# Patient Record
Sex: Male | Born: 1935 | Race: White | Hispanic: No | Smoking: Former smoker
Health system: Southern US, Community
[De-identification: ages and names within clinical notes are randomized; demographics above are authoritative.]

## PROBLEM LIST (undated history)

## (undated) ENCOUNTER — Emergency Department: Payer: PRIVATE HEALTH INSURANCE

## (undated) DIAGNOSIS — E7800 Pure hypercholesterolemia, unspecified: Secondary | ICD-10-CM

## (undated) DIAGNOSIS — K2980 Duodenitis without bleeding: Secondary | ICD-10-CM

## (undated) DIAGNOSIS — K219 Gastro-esophageal reflux disease without esophagitis: Secondary | ICD-10-CM

## (undated) DIAGNOSIS — C61 Malignant neoplasm of prostate: Secondary | ICD-10-CM

## (undated) DIAGNOSIS — Z87442 Personal history of urinary calculi: Secondary | ICD-10-CM

## (undated) DIAGNOSIS — J45909 Unspecified asthma, uncomplicated: Secondary | ICD-10-CM

## (undated) DIAGNOSIS — N289 Disorder of kidney and ureter, unspecified: Secondary | ICD-10-CM

## (undated) DIAGNOSIS — F32A Depression, unspecified: Secondary | ICD-10-CM

## (undated) DIAGNOSIS — F329 Major depressive disorder, single episode, unspecified: Secondary | ICD-10-CM

## (undated) DIAGNOSIS — K5790 Diverticulosis of intestine, part unspecified, without perforation or abscess without bleeding: Secondary | ICD-10-CM

## (undated) DIAGNOSIS — R251 Tremor, unspecified: Secondary | ICD-10-CM

## (undated) DIAGNOSIS — G709 Myoneural disorder, unspecified: Secondary | ICD-10-CM

## (undated) DIAGNOSIS — K2270 Barrett's esophagus without dysplasia: Secondary | ICD-10-CM

## (undated) DIAGNOSIS — I10 Essential (primary) hypertension: Secondary | ICD-10-CM

## (undated) DIAGNOSIS — S8291XA Unspecified fracture of right lower leg, initial encounter for closed fracture: Secondary | ICD-10-CM

## (undated) DIAGNOSIS — K2970 Gastritis, unspecified, without bleeding: Secondary | ICD-10-CM

## (undated) DIAGNOSIS — Z972 Presence of dental prosthetic device (complete) (partial): Secondary | ICD-10-CM

## (undated) DIAGNOSIS — E119 Type 2 diabetes mellitus without complications: Secondary | ICD-10-CM

## (undated) DIAGNOSIS — N200 Calculus of kidney: Secondary | ICD-10-CM

## (undated) DIAGNOSIS — N2889 Other specified disorders of kidney and ureter: Secondary | ICD-10-CM

## (undated) DIAGNOSIS — N400 Enlarged prostate without lower urinary tract symptoms: Secondary | ICD-10-CM

## (undated) DIAGNOSIS — C801 Malignant (primary) neoplasm, unspecified: Secondary | ICD-10-CM

## (undated) DIAGNOSIS — G4730 Sleep apnea, unspecified: Secondary | ICD-10-CM

## (undated) DIAGNOSIS — I2510 Atherosclerotic heart disease of native coronary artery without angina pectoris: Secondary | ICD-10-CM

## (undated) DIAGNOSIS — F0390 Unspecified dementia without behavioral disturbance: Secondary | ICD-10-CM

## (undated) DIAGNOSIS — M1990 Unspecified osteoarthritis, unspecified site: Secondary | ICD-10-CM

## (undated) HISTORY — PX: CERVICAL FUSION: SHX112

## (undated) HISTORY — PX: KNEE FUSION: SUR623

## (undated) HISTORY — PX: CORONARY ANGIOPLASTY: SHX604

## (undated) HISTORY — PX: EYE SURGERY: SHX253

## (undated) HISTORY — PX: HERNIA REPAIR: SHX51

## (undated) HISTORY — PX: JOINT REPLACEMENT: SHX530

## (undated) HISTORY — PX: CARDIAC CATHETERIZATION: SHX172

## (undated) HISTORY — PX: NISSEN FUNDOPLICATION: SHX2091

## (undated) HISTORY — PX: APPENDECTOMY: SHX54

## (undated) HISTORY — PX: KNEE SURGERY: SHX244

---
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
## 2003-10-14 ENCOUNTER — Other Ambulatory Visit: Payer: Self-pay

## 2004-09-02 ENCOUNTER — Other Ambulatory Visit: Payer: Self-pay

## 2004-09-02 ENCOUNTER — Inpatient Hospital Stay: Payer: Self-pay | Admitting: Internal Medicine

## 2004-12-09 ENCOUNTER — Ambulatory Visit: Payer: Self-pay | Admitting: Internal Medicine

## 2005-03-12 ENCOUNTER — Emergency Department: Payer: Self-pay | Admitting: Emergency Medicine

## 2005-06-08 ENCOUNTER — Inpatient Hospital Stay: Payer: Self-pay | Admitting: Cardiovascular Disease

## 2005-06-08 ENCOUNTER — Other Ambulatory Visit: Payer: Self-pay

## 2005-06-09 ENCOUNTER — Other Ambulatory Visit: Payer: Self-pay

## 2005-07-25 ENCOUNTER — Ambulatory Visit: Payer: Self-pay | Admitting: Internal Medicine

## 2005-11-22 ENCOUNTER — Ambulatory Visit: Payer: Self-pay | Admitting: Urology

## 2005-12-12 ENCOUNTER — Ambulatory Visit: Payer: Self-pay | Admitting: Internal Medicine

## 2005-12-24 ENCOUNTER — Ambulatory Visit: Payer: Self-pay | Admitting: Family Medicine

## 2006-05-12 ENCOUNTER — Ambulatory Visit: Payer: Self-pay | Admitting: Cardiovascular Disease

## 2006-05-23 ENCOUNTER — Ambulatory Visit: Payer: Self-pay | Admitting: Urology

## 2007-01-17 ENCOUNTER — Inpatient Hospital Stay: Payer: Self-pay | Admitting: Cardiovascular Disease

## 2007-01-18 ENCOUNTER — Other Ambulatory Visit: Payer: Self-pay

## 2007-01-23 ENCOUNTER — Ambulatory Visit: Payer: Self-pay | Admitting: Urology

## 2007-01-30 ENCOUNTER — Ambulatory Visit: Payer: Self-pay | Admitting: Cardiovascular Disease

## 2007-08-03 ENCOUNTER — Ambulatory Visit: Payer: Self-pay | Admitting: Family Medicine

## 2007-09-25 ENCOUNTER — Ambulatory Visit: Payer: Self-pay | Admitting: Gastroenterology

## 2007-12-27 ENCOUNTER — Ambulatory Visit: Payer: Self-pay | Admitting: Gastroenterology

## 2008-06-09 ENCOUNTER — Ambulatory Visit: Payer: Self-pay | Admitting: Family Medicine

## 2008-08-06 ENCOUNTER — Ambulatory Visit: Payer: Self-pay | Admitting: Internal Medicine

## 2008-09-01 ENCOUNTER — Ambulatory Visit: Payer: Self-pay | Admitting: Family Medicine

## 2008-10-01 ENCOUNTER — Ambulatory Visit: Payer: Self-pay | Admitting: Family Medicine

## 2009-01-26 ENCOUNTER — Ambulatory Visit: Payer: Self-pay | Admitting: Gastroenterology

## 2009-02-10 ENCOUNTER — Ambulatory Visit: Payer: Self-pay | Admitting: Cardiovascular Disease

## 2009-05-19 ENCOUNTER — Emergency Department: Payer: Self-pay | Admitting: Emergency Medicine

## 2009-05-19 ENCOUNTER — Ambulatory Visit: Payer: Self-pay

## 2009-05-20 ENCOUNTER — Ambulatory Visit: Payer: Self-pay

## 2009-07-01 ENCOUNTER — Ambulatory Visit: Payer: Self-pay | Admitting: Family Medicine

## 2009-07-09 ENCOUNTER — Ambulatory Visit: Payer: Self-pay | Admitting: Surgery

## 2009-08-03 ENCOUNTER — Ambulatory Visit: Payer: Self-pay | Admitting: Pain Medicine

## 2009-08-26 ENCOUNTER — Emergency Department: Payer: Self-pay | Admitting: Emergency Medicine

## 2010-02-02 ENCOUNTER — Inpatient Hospital Stay: Payer: Self-pay | Admitting: Internal Medicine

## 2010-07-22 ENCOUNTER — Ambulatory Visit: Payer: Self-pay | Admitting: Urology

## 2011-04-12 ENCOUNTER — Ambulatory Visit: Payer: Self-pay | Admitting: Gastroenterology

## 2011-05-10 ENCOUNTER — Ambulatory Visit: Payer: Self-pay | Admitting: Gastroenterology

## 2011-07-25 ENCOUNTER — Ambulatory Visit: Payer: Self-pay | Admitting: Urology

## 2011-09-02 ENCOUNTER — Ambulatory Visit: Payer: Self-pay | Admitting: Internal Medicine

## 2012-03-27 ENCOUNTER — Ambulatory Visit: Payer: Self-pay | Admitting: Gastroenterology

## 2012-05-22 ENCOUNTER — Ambulatory Visit: Payer: Self-pay | Admitting: Internal Medicine

## 2012-06-16 ENCOUNTER — Emergency Department: Payer: Self-pay | Admitting: Emergency Medicine

## 2012-06-16 LAB — CBC
HCT: 39.4 % — ABNORMAL LOW (ref 40.0–52.0)
HGB: 13.5 g/dL (ref 13.0–18.0)
MCH: 30.3 pg (ref 26.0–34.0)
MCHC: 34.2 g/dL (ref 32.0–36.0)
Platelet: 136 10*3/uL — ABNORMAL LOW (ref 150–440)

## 2012-06-16 LAB — URINALYSIS, COMPLETE
Bacteria: NONE SEEN
Bilirubin,UR: NEGATIVE
Hyaline Cast: 7
Ph: 6 (ref 4.5–8.0)
Protein: 100
RBC,UR: 1 /HPF (ref 0–5)
Specific Gravity: 1.012 (ref 1.003–1.030)

## 2012-06-16 LAB — CK TOTAL AND CKMB (NOT AT ARMC): CK, Total: 116 U/L (ref 35–232)

## 2012-06-16 LAB — COMPREHENSIVE METABOLIC PANEL
Alkaline Phosphatase: 92 U/L (ref 50–136)
Anion Gap: 10 (ref 7–16)
Bilirubin,Total: 0.4 mg/dL (ref 0.2–1.0)
Calcium, Total: 8.9 mg/dL (ref 8.5–10.1)
Creatinine: 2.65 mg/dL — ABNORMAL HIGH (ref 0.60–1.30)
EGFR (African American): 26 — ABNORMAL LOW
EGFR (Non-African Amer.): 23 — ABNORMAL LOW
Glucose: 249 mg/dL — ABNORMAL HIGH (ref 65–99)
Osmolality: 285 (ref 275–301)
Potassium: 4.2 mmol/L (ref 3.5–5.1)
SGOT(AST): 24 U/L (ref 15–37)
SGPT (ALT): 23 U/L (ref 12–78)

## 2012-06-16 LAB — TROPONIN I: Troponin-I: <0.02 ng/mL

## 2012-06-16 LAB — PROTIME-INR
INR: 1
Prothrombin Time: 13 secs (ref 11.5–14.7)

## 2012-06-16 LAB — MAGNESIUM: Magnesium: 1.6 mg/dL — ABNORMAL LOW

## 2012-06-16 LAB — PRO B NATRIURETIC PEPTIDE: B-Type Natriuretic Peptide: 135 pg/mL — ABNORMAL HIGH (ref 0–125)

## 2012-06-16 LAB — APTT: Activated PTT: 27.7 secs (ref 23.6–35.9)

## 2012-06-19 ENCOUNTER — Ambulatory Visit: Payer: Self-pay | Admitting: Internal Medicine

## 2012-08-14 DIAGNOSIS — N200 Calculus of kidney: Secondary | ICD-10-CM | POA: Insufficient documentation

## 2012-08-14 DIAGNOSIS — R339 Retention of urine, unspecified: Secondary | ICD-10-CM | POA: Insufficient documentation

## 2012-08-14 DIAGNOSIS — N281 Cyst of kidney, acquired: Secondary | ICD-10-CM | POA: Insufficient documentation

## 2012-08-14 DIAGNOSIS — N401 Enlarged prostate with lower urinary tract symptoms: Secondary | ICD-10-CM | POA: Insufficient documentation

## 2012-08-14 DIAGNOSIS — N2889 Other specified disorders of kidney and ureter: Secondary | ICD-10-CM | POA: Insufficient documentation

## 2012-08-14 DIAGNOSIS — R972 Elevated prostate specific antigen [PSA]: Secondary | ICD-10-CM | POA: Insufficient documentation

## 2012-08-14 DIAGNOSIS — D4100 Neoplasm of uncertain behavior of unspecified kidney: Secondary | ICD-10-CM | POA: Insufficient documentation

## 2012-08-15 DIAGNOSIS — N183 Chronic kidney disease, stage 3 unspecified: Secondary | ICD-10-CM | POA: Insufficient documentation

## 2013-08-27 ENCOUNTER — Ambulatory Visit: Payer: Self-pay | Admitting: Gastroenterology

## 2013-08-28 LAB — PATHOLOGY REPORT

## 2014-02-02 DIAGNOSIS — Z794 Long term (current) use of insulin: Secondary | ICD-10-CM | POA: Insufficient documentation

## 2014-02-02 DIAGNOSIS — R809 Proteinuria, unspecified: Secondary | ICD-10-CM | POA: Insufficient documentation

## 2014-02-02 DIAGNOSIS — G629 Polyneuropathy, unspecified: Secondary | ICD-10-CM | POA: Insufficient documentation

## 2014-02-02 DIAGNOSIS — E1149 Type 2 diabetes mellitus with other diabetic neurological complication: Secondary | ICD-10-CM | POA: Insufficient documentation

## 2014-04-02 IMAGING — US US RENAL KIDNEY
1 series · 17 of 25 positions shown · non-contrast
Comparison: none

REASON FOR EXAM: complex cysts with uric acid stones
COMMENTS:

PROCEDURE:     US  - US KIDNEY  - July 25, 2011  [DATE]
RESULT:

[Series 1: us renal kidney · 17 of 45 slices shown]
[im 1/45]
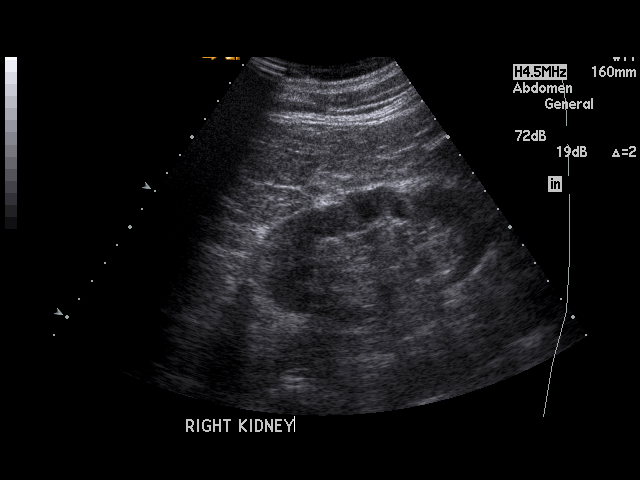
[im 4/45]
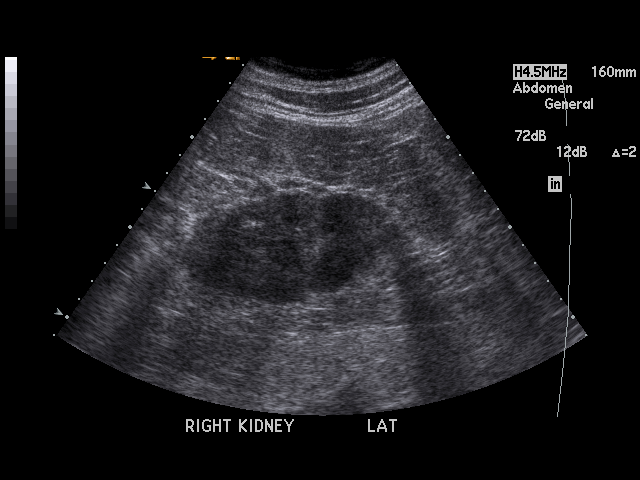
[im 6/45]
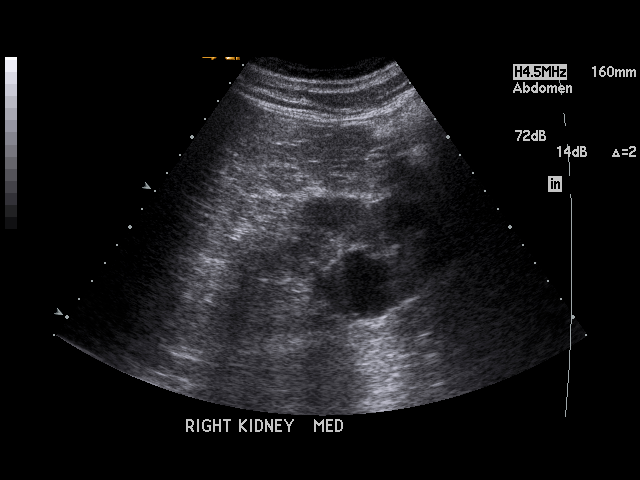
[im 10/45]
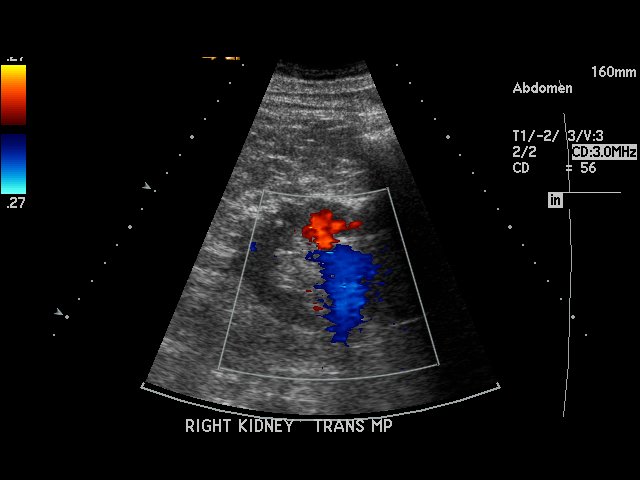
[im 12/45]
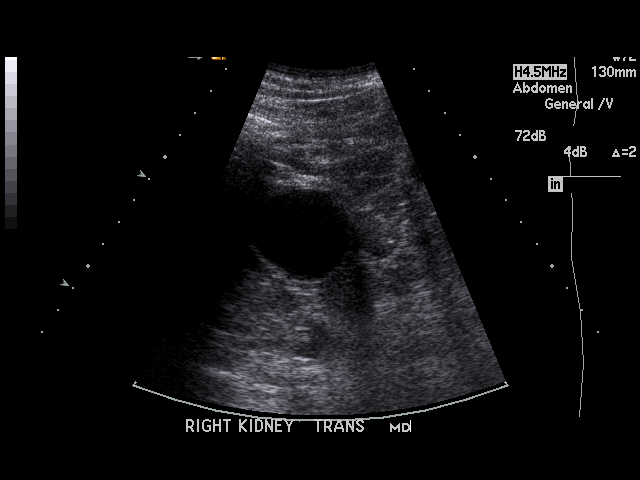
[im 15/45]
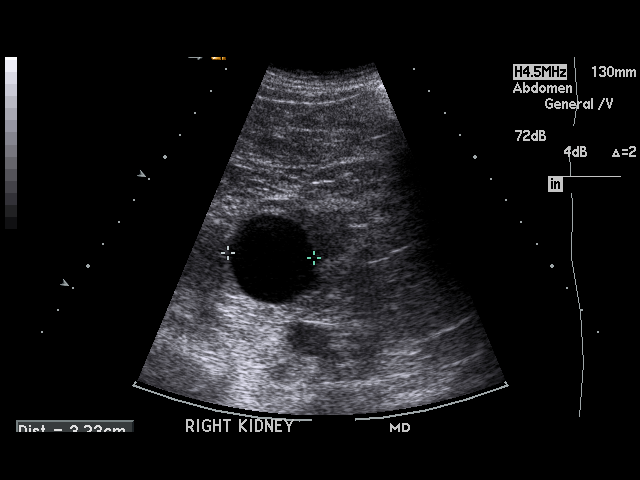
[im 17/45]
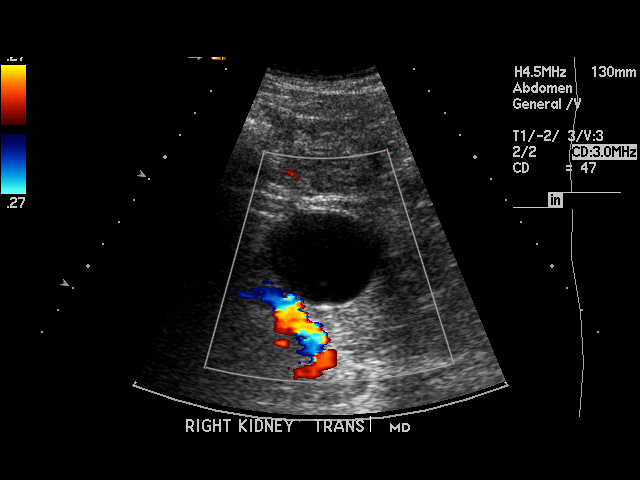
[im 21/45]
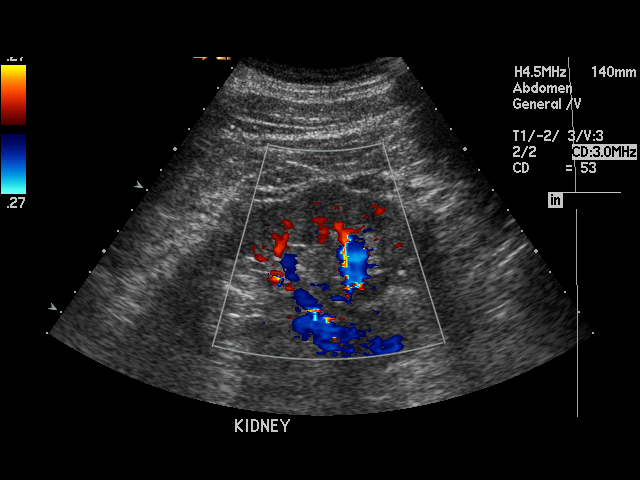
[im 23/45]
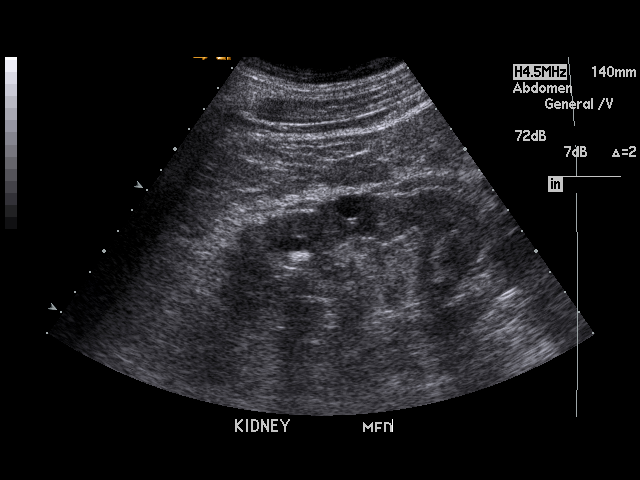
[im 24/45]
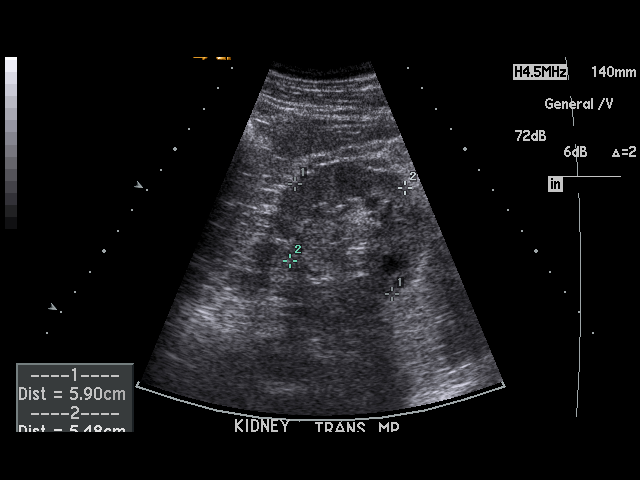
[im 28/45]
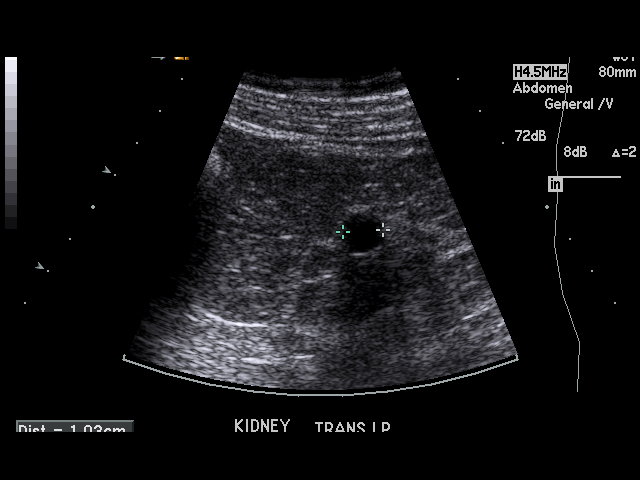
[im 30/45]
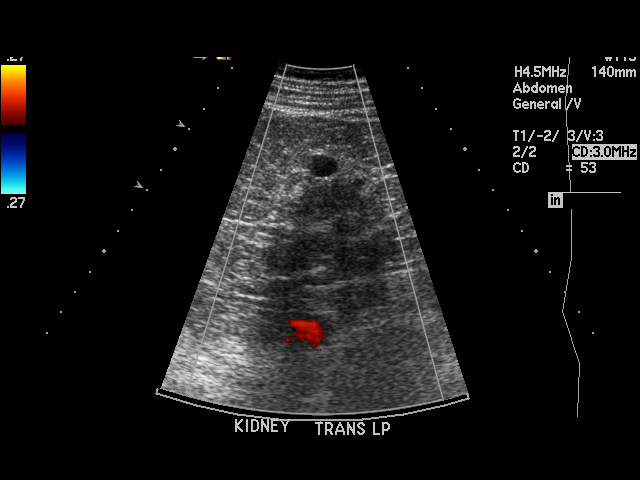
[im 34/45]
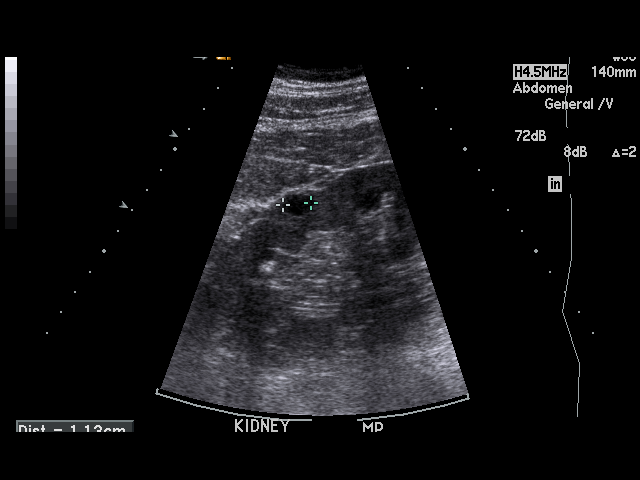
[im 35/45]
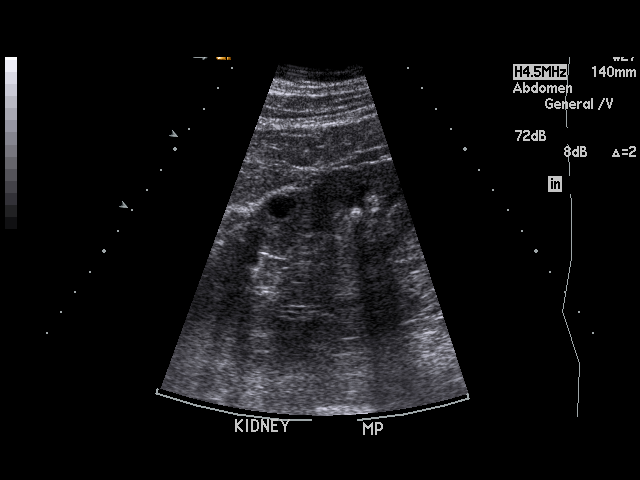
[im 39/45]
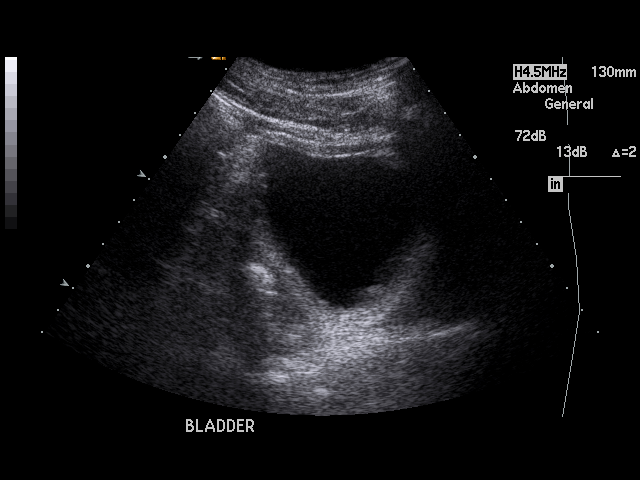
[im 41/45]
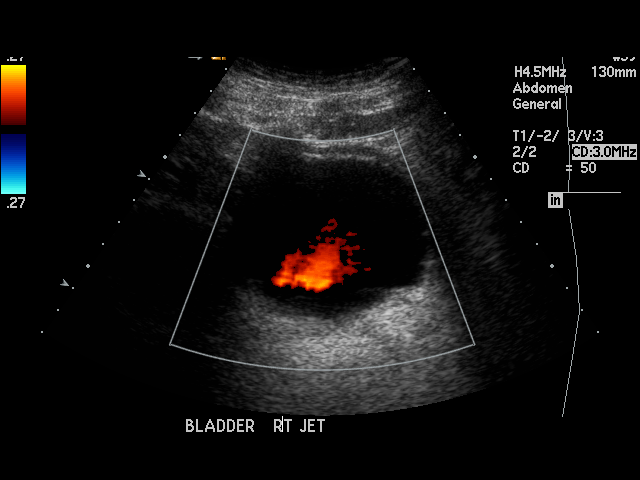
[im 45/45]
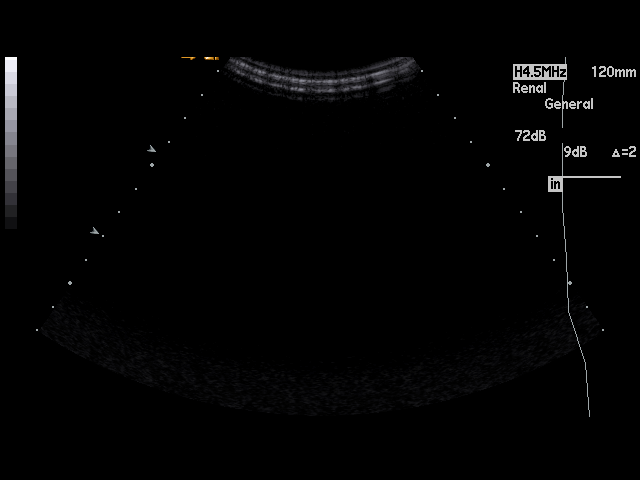

[17 of 25 positions shown; findings below may reference images not displayed]

FINDINGS: The right kidney measures 11.2 x 5.5 x 5.1 cm and the left 11.7 x
5.9 x 5.4 cm. The kidneys demonstrate appropriate corticomedullary
differentiation without evidence of hydronephrosis, solid masses or calculi.
A cyst is identified within the midpole region of the right kidney measuring
3.6 x 3.4 by 3.2 cm. On the left a lower pole cyst is identified measuring
1.2 x 1 x 1 cm and a midpole cyst measuring 1 x 0.93 x 1.1 cm. This study
was compared to a previous study dated 07/22/2010. The cyst within the midpole
region of the right kidney, with technical variability taken into
consideration, is unchanged and previously measuring 3.4 cm in diameter. The
two cysts described within the left kidney are unchanged.
IMPRESSION: Stable cyst within the right and left kidneys as described
above. There is slight discrepancy between the measurements of the cyst in
the right kidney though with technical variability taken into consideration
the cyst is unchanged.

## 2014-07-11 NOTE — Consult Note (Signed)
  DATE OF BIRTH:  01-30-1936  DATE OF CONSULTATION:  06/16/2012  CONSULTING PHYSICIAN:  Dionisio David, MD  HISTORY OF PRESENT ILLNESS: This is a 79 year old white male with a past medical history of PCI and stenting, coronary artery disease, renal insufficiency, came into the hospital with neck pain and right facial pain. He was also dizzy and had low blood pressure, about 80 systolic. Thus, I was asked to evaluate the patient. The patient denies any chest pain right now, but has right facial pain associated with shortness of breath.   PAST MEDICAL HISTORY: History of hypertension, hyperlipidemia, status post PCI and stenting.   SOCIAL HISTORY:  Unremarkable.   PHYSICAL EXAMINATION: VITAL SIGNS:  His blood pressure is 89/60, respirations 14, his heart rate is 78.  NECK:  No JVD.  LUNGS:  Clear.  HEART:  Regular rate and rhythm. Normal S1, S2. No audible murmur.  ABDOMEN:  Soft, nontender, positive bowel sounds.  EXTREMITIES:  No pedal edema.  NEUROLOGIC:  The patient appears to be intact.   LAB DATA:  His potassium is 4.2, creatinine is 2.65, BUN is 41. His CPK is 116, MB is 3.4. Troponin is 0.02. His magnesium is 1.6. BNP is 135. Chest x-ray is unremarkable.   ASSESSMENT AND PLAN: The patient has renal insufficiency, atypical chest pain. EKG shows normal sinus rhythm, no acute changes. His cardiac enzyme panel is negative. He does have renal insufficiency, but has been seeing Renal in Dr. Elwyn Lade and Dr. Keturah Barre office. His GFR is right now 23, but it was 35, so apparently he is a little bit dehydrated. Advise giving some hydration. May consider giving the patient some antibiotic for possible sinusitis, and followup in the office at 11:00 tomorrow. Thank you very much for the referral. His stress test in the office was done Wednesday, it was unremarkable.      ____________________________ Dionisio David, MD sak:mr D: 06/16/2012 18:26:44 ET T: 06/16/2012 19:12:43  ET JOB#: [id number]  cc: Dionisio David, MD, <Dictator> Dionisio David MD ELECTRONICALLY SIGNED 06/21/2012 9:33

## 2014-07-26 ENCOUNTER — Emergency Department: Payer: Medicare Other

## 2014-07-26 ENCOUNTER — Other Ambulatory Visit: Payer: Self-pay

## 2014-07-26 ENCOUNTER — Emergency Department
Admission: EM | Admit: 2014-07-26 | Discharge: 2014-07-26 | Disposition: A | Discharge status: Home / Self Care | Payer: Medicare Other | Attending: Emergency Medicine | Admitting: Emergency Medicine

## 2014-07-26 DIAGNOSIS — R0602 Shortness of breath: Secondary | ICD-10-CM | POA: Diagnosis not present

## 2014-07-26 DIAGNOSIS — M6281 Muscle weakness (generalized): Secondary | ICD-10-CM | POA: Diagnosis not present

## 2014-07-26 DIAGNOSIS — F329 Major depressive disorder, single episode, unspecified: Secondary | ICD-10-CM | POA: Diagnosis not present

## 2014-07-26 DIAGNOSIS — R251 Tremor, unspecified: Secondary | ICD-10-CM | POA: Diagnosis not present

## 2014-07-26 DIAGNOSIS — R42 Dizziness and giddiness: Secondary | ICD-10-CM | POA: Diagnosis present

## 2014-07-26 DIAGNOSIS — F32A Depression, unspecified: Secondary | ICD-10-CM

## 2014-07-26 LAB — URINALYSIS COMPLETE WITH MICROSCOPIC (ARMC ONLY)
Bacteria, UA: NONE SEEN
Bilirubin Urine: NEGATIVE
Glucose, UA: NEGATIVE mg/dL
Hgb urine dipstick: NEGATIVE
KETONES UR: NEGATIVE mg/dL
Leukocytes, UA: NEGATIVE
NITRITE: NEGATIVE
PH: 5 (ref 5.0–8.0)
PROTEIN: NEGATIVE mg/dL
Specific Gravity, Urine: 1.011 (ref 1.005–1.030)

## 2014-07-26 LAB — BASIC METABOLIC PANEL
Anion gap: 7 (ref 5–15)
BUN: 31 mg/dL — AB (ref 6–20)
CHLORIDE: 107 mmol/L (ref 101–111)
CO2: 21 mmol/L — ABNORMAL LOW (ref 22–32)
Calcium: 8.8 mg/dL — ABNORMAL LOW (ref 8.9–10.3)
Creatinine, Ser: 0.99 mg/dL (ref 0.61–1.24)
GFR calc Af Amer: >60 mL/min (ref >60)
Glucose, Bld: 127 mg/dL — ABNORMAL HIGH (ref 65–99)
Potassium: 4.1 mmol/L (ref 3.5–5.1)
Sodium: 135 mmol/L (ref 135–145)

## 2014-07-26 LAB — CBC WITH DIFFERENTIAL/PLATELET
Basophils Absolute: 0 10*3/uL (ref 0–0.1)
Basophils Relative: 1 %
Eosinophils Absolute: 0.1 10*3/uL (ref 0–0.7)
Eosinophils Relative: 1 %
HCT: 40.7 % (ref 40.0–52.0)
Hemoglobin: 13.7 g/dL (ref 13.0–18.0)
LYMPHS ABS: 1.7 10*3/uL (ref 1.0–3.6)
LYMPHS PCT: 24 %
MCH: 29.5 pg (ref 26.0–34.0)
MCHC: 33.7 g/dL (ref 32.0–36.0)
MCV: 87.6 fL (ref 80.0–100.0)
MONOS PCT: 7 %
Monocytes Absolute: 0.5 10*3/uL (ref 0.2–1.0)
Neutro Abs: 4.7 10*3/uL (ref 1.4–6.5)
Neutrophils Relative %: 67 %
PLATELETS: 114 10*3/uL — AB (ref 150–440)
RBC: 4.65 MIL/uL (ref 4.40–5.90)
RDW: 14.7 % — ABNORMAL HIGH (ref 11.5–14.5)
WBC: 7 10*3/uL (ref 3.8–10.6)

## 2014-07-26 LAB — TROPONIN I: Troponin I: <0.03 ng/mL (ref <0.031)

## 2014-07-26 NOTE — [facility] Counselor (Signed)
Per request of ER MD (Dr. Reita Cliche), writer provided the pt. with information and instructions on how to access Out Pt. Mental Health Treatment Ulis Rias, Psych MD  and [facility])

## 2014-07-26 NOTE — ED Notes (Signed)
Family at bedside. 

## 2014-07-26 NOTE — Discharge Instructions (Signed)
You're examining evaluation are reassuring. Return to the emergency department for any new or worsening chest pain and trouble breathing, shortness of breath, fever, weakness or numbness, altered mental status, or worsening depression including any thoughts of wanting to hurt herself or others.

## 2014-07-26 NOTE — BH Assessment (Signed)
Assessment Note  Todd Davidson is an 79 y.o. male Who presents to ER initially due to concerns he was having about being dizzy and fear that he was stroke. While in the ER with his family, they voiced concern about him having depression. Per the brothers & sister, he is isolating and low energy.  Factors that are contributing to his current mental and emotional state are; his wife died approximately a year ago and he is unable to do what he wants.  He was married for over 52 years. Last 5 years of the wife, she was diagnosed with dementia.  He recently found out, his sister had been diagnosed with breast cancer.  Pt. denies SI/HI and AV/H. He also denies alcohol and substance use.   Axis I: Depressive Disorder NOS Axis III: No past medical history on file. Axis IV: other psychosocial or environmental problems, problems related to social environment and problems with access to health care services  Past Medical History: No past medical history on file.  No past surgical history on file.  Family History: No family history on file.  Social History:  has no tobacco, alcohol, and drug history on file.  Additional Social History:  Alcohol / Drug Use Pain Medications: None reported Prescriptions: None reported Over the Counter: None reported History of alcohol / drug use?: No history of alcohol / drug abuse Negative Consequences of Use:  (None reported) Withdrawal Symptoms:  (None reported)  CIWA: CIWA-Ar BP: (!) 154/77 mmHg Pulse Rate: (!) 50 COWS:    Allergies: Allergies not on file  Home Medications:  (Not in a hospital admission)  OB/GYN Status:  No LMP for male patient.  General Assessment Data Location of Assessment: [facility] ED TTS Assessment: In system Is this a Tele or Face-to-Face Assessment?: Face-to-Face Is this an Initial Assessment or a Re-assessment for this encounter?: Initial Assessment Marital status: Widowed Is patient pregnant?: No Pregnancy Status: No Living  Arrangements: Alone Can pt return to current living arrangement?: Yes Admission Status: Voluntary Is patient capable of signing voluntary admission?: Yes Referral Source: Self/Family/Friend  Medical Screening Exam ([place]) Medical Exam completed: Yes  Crisis Care Plan Living Arrangements: Alone  Education Status Is patient currently in school?: No Highest grade of school patient has completed: 12  Risk to self with the past 6 months Suicidal Ideation: No Has patient been a risk to self within the past 6 months prior to admission? : No Suicidal Intent: No Has patient had any suicidal intent within the past 6 months prior to admission? : No Is patient at risk for suicide?: No Suicidal Plan?: No Has patient had any suicidal plan within the past 6 months prior to admission? : No Access to Means: No What has been your use of drugs/alcohol within the last 12 months?: None reported Previous Attempts/Gestures: No How many times?: 0 Other Self Harm Risks: None reported Triggers for Past Attempts: None known Intentional Self Injurious Behavior: None Family Suicide History: No Recent stressful life event(s): Other (Comment), Financial Problems (Wife dead) Persecutory voices/beliefs?: No Depression: Yes Depression Symptoms: Fatigue, Isolating, Feeling worthless/self pity Substance abuse history and/or treatment for substance abuse?: No Suicide prevention information given to non-admitted patients: Not applicable  Risk to Others within the past 6 months Homicidal Ideation: No Does patient have any lifetime risk of violence toward others beyond the six months prior to admission? : No Thoughts of Harm to Others: No Current Homicidal Intent: No Current Homicidal Plan: No Access to Homicidal  Means: No Identified Victim: None reported History of harm to others?: No Assessment of Violence: None Noted Violent Behavior Description: None reported Does patient have access to  weapons?: No Criminal Charges Pending?: No Does patient have a court date: No Is patient on probation?: No  Psychosis Hallucinations: None noted Delusions: None noted  Mental Status Report Appearance/Hygiene: Unremarkable Eye Contact: Fair Motor Activity: Unremarkable, Unsteady Speech: Logical/coherent, Unremarkable Level of Consciousness: Alert Mood: Depressed, Sad, Euthymic Affect: Depressed, Sad Anxiety Level: None Thought Processes: Coherent, Relevant Judgement: Unimpaired Orientation: Person, Place, Time, Situation, Appropriate for developmental age Obsessive Compulsive Thoughts/Behaviors: None  Cognitive Functioning Concentration: Normal Memory: Recent Intact, Remote Intact IQ: Average Insight: Fair Impulse Control: Good Appetite: Good Weight Loss: 0 Weight Gain: 0 Sleep: No Change Total Hours of Sleep: 8 Vegetative Symptoms: None  ADLScreening [facility] Assessment Services) Patient's cognitive ability adequate to safely complete daily activities?: Yes Patient able to express need for assistance with ADLs?: Yes Independently performs ADLs?: Yes (appropriate for developmental age)  Prior Inpatient Therapy Prior Inpatient Therapy: No Prior Therapy Dates: None reported Prior Therapy Facilty/Provider(s): None reported Reason for Treatment: None reported  Prior Outpatient Therapy Prior Outpatient Therapy: No Prior Therapy Dates: None reported Prior Therapy Facilty/Provider(s): None reported Reason for Treatment: None reported Does patient have an ACCT team?: No Does patient have Intensive In-House Services?  : No Does patient have [facility] services? : No Does patient have P4CC services?: No  ADL Screening (condition at time of admission) Patient's cognitive ability adequate to safely complete daily activities?: Yes Patient able to express need for assistance with ADLs?: Yes Independently performs ADLs?: Yes (appropriate for developmental age)                   Additional Information 1:1 In Past 12 Months?: No CIRT Risk: No Elopement Risk: No Does patient have medical clearance?: Yes  Child/Adolescent Assessment Running Away Risk: Denies (Pt. is an adult)  Disposition:  Disposition Initial Assessment Completed for this Encounter: Yes Disposition of Patient: Referred to Patient referred to: Other (Comment) Ulis Rias, Psych MD & [facility])  On Site Evaluation by:   Reviewed with Physician:    Durene Cal 07/26/2014 7:16 PM

## 2014-07-26 NOTE — ED Provider Notes (Signed)
[facility] Emergency Department Provider Note   ____________________________________________  Time seen: Noon  I have reviewed the triage vital signs and the nursing notes.   HISTORY  Chief Complaint Dizziness  somewhat poor historian brother provides some history as well as patient HPI Todd Davidson is a 79 y.o. male who complains of an episode of shortness of breath that happened when he got home after doing laundry. He felt like his legs were weak and it he was having tremors of his lower extremities bilaterally. No chest pain. No recent cough fevers or other illness. No abdominal pain. Patient reports he still feels generalized weakness and tremulousness in the lower extremities but he is no longer having shortness of breath.   Brother states that the patient has been under quite a bit of stress recently and does not take stress well. He feels like anxiety is a significant component of what has been going on.   No past medical history on file.  There are no active problems to display for this patient.   No past surgical history on file.  No current outpatient prescriptions on file.  Allergies Review of patient's allergies indicates not on file.  No family history on file.  Social History History  Substance Use Topics  . Smoking status: Not on file  . Smokeless tobacco: Not on file  . Alcohol Use: Not on file    Review of Systems  Constitutional: Negative for fever. Eyes: Negative for visual changes. ENT: Negative for sore throat. Cardiovascular: Negative for chest pain. Respiratory: Short of breath per history of present illness Gastrointestinal: Negative for abdominal pain, vomiting and diarrhea. Genitourinary: Negative for dysuria. Musculoskeletal: Negative for back pain. Skin: Negative for rash. Neurological: Negative for headaches, focal weakness or numbness.   10-point ROS otherwise  negative.  ____________________________________________   PHYSICAL EXAM:  VITAL SIGNS: ED Triage Vitals  Enc Vitals Group     BP 07/26/14 1142 128/60 mmHg     Pulse Rate 07/26/14 1142 78     Resp 07/26/14 1150 16     Temp 07/26/14 1150 98.2 F (36.8 C)     Temp Source 07/26/14 1150 Oral     SpO2 07/26/14 1142 97 %     Weight 07/26/14 1142 200 lb (90.719 kg)     Height 07/26/14 1150 6' (1.829 m)     Head Cir --      Peak Flow --      Pain Score --      Pain Loc --      Pain Edu? --      Excl. in [place]? --      Constitutional: Alert and oriented. Well appearing and in no distress. Eyes: Conjunctivae are normal. PERRL. Normal extraocular movements. ENT   Head: Normocephalic and atraumatic.   Nose: No congestion/rhinnorhea.   Mouth/Throat: Mucous membranes are moist.   Neck: No stridor. Cardiovascular: Normal rate, regular rhythm.  No murmurs, rubs, or gallops. Respiratory: Normal respiratory effort without tachypnea nor retractions. Breath sounds are clear and equal bilaterally. No wheezes/rales/rhonchi. Gastrointestinal: Soft and nontender. No distention.  Genitourinary:  Musculoskeletal: Nontender with normal range of motion in all extremities. No joint effusions.  No lower extremity tenderness nor edema. Old scarring to the right knee. Neurologic:  Normal speech and language. No gross focal neurologic deficits are appreciated. Speech is normal. Thought poor historian but oriented. He has some intention tremor in 4 extremities. 4-5 strength in bilateral lower extremities and 5 out  of 5 strength in 2 upper extremities Skin:  Skin is warm, dry and intact. No rash noted. Psychiatric: Mood and affect are normal. Speech and behavior are normal. Patient exhibits appropriate insight and judgment.  ____________________________________________    LABS (pertinent positives/negatives)  Urinalysis normal Metabolic panel without blood joint abnormality is Troponin  negative CBC within normal limits other than platelets 114  ____________________________________________   EKG  Sinus bradycardia at 55 bpm. Normal axis. Narrow QRS. Normal ST and T-wave.  ____________________________________________    RADIOLOGY Reviewed head CT no acute abnormality Reviewed chest x-ray: No acute cardio pulmonary disease  ____________________________________________   PROCEDURES  Procedure(s) performed: None  Critical Care performed: No  ____________________________________________   INITIAL IMPRESSION / ASSESSMENT AND PLAN / ED COURSE  Pertinent labs & imaging results that were available during my care of the patient were reviewed by me and considered in my medical decision making (see chart for details).  Patient's exam and evaluation are reassuring. I refer him to follow-up with his primary care physician to also consider evaluation for anxiety.   I did have Calvin from psychiatry discuss outpatient referral information and provide this info to the patient and family. ____________________________________________   FINAL CLINICAL IMPRESSION(S) / ED DIAGNOSES Depression, unspecified with no suicidal ideation Tremors, bilateral lower extremities, nonspecific Respiratory episode nonspecific    Lisa Roca, MD 07/26/14 [id number]

## 2014-07-26 NOTE — ED Notes (Signed)
Sister at bedside.

## 2014-10-09 ENCOUNTER — Ambulatory Visit: Payer: Self-pay | Admitting: Psychiatry

## 2014-10-22 ENCOUNTER — Encounter: Payer: Self-pay | Admitting: *Deleted

## 2014-10-27 NOTE — Discharge Instructions (Signed)

## 2014-10-29 ENCOUNTER — Ambulatory Visit: Payer: Medicare Other | Admitting: Anesthesiology

## 2014-10-29 ENCOUNTER — Encounter
Admission: RE | Disposition: A | Discharge status: Home / Self Care | Payer: Self-pay | Source: Ambulatory Visit | Attending: Ophthalmology

## 2014-10-29 ENCOUNTER — Ambulatory Visit
Admission: RE | Admit: 2014-10-29 | Discharge: 2014-10-29 | Disposition: A | Discharge status: Home / Self Care | Payer: Medicare Other | Source: Ambulatory Visit | Attending: Ophthalmology | Admitting: Ophthalmology

## 2014-10-29 DIAGNOSIS — K219 Gastro-esophageal reflux disease without esophagitis: Secondary | ICD-10-CM | POA: Insufficient documentation

## 2014-10-29 DIAGNOSIS — G4730 Sleep apnea, unspecified: Secondary | ICD-10-CM | POA: Diagnosis not present

## 2014-10-29 DIAGNOSIS — H9190 Unspecified hearing loss, unspecified ear: Secondary | ICD-10-CM | POA: Insufficient documentation

## 2014-10-29 DIAGNOSIS — Z955 Presence of coronary angioplasty implant and graft: Secondary | ICD-10-CM | POA: Diagnosis not present

## 2014-10-29 DIAGNOSIS — Z7982 Long term (current) use of aspirin: Secondary | ICD-10-CM | POA: Diagnosis not present

## 2014-10-29 DIAGNOSIS — Z881 Allergy status to other antibiotic agents status: Secondary | ICD-10-CM | POA: Diagnosis not present

## 2014-10-29 DIAGNOSIS — I2510 Atherosclerotic heart disease of native coronary artery without angina pectoris: Secondary | ICD-10-CM | POA: Diagnosis not present

## 2014-10-29 DIAGNOSIS — N289 Disorder of kidney and ureter, unspecified: Secondary | ICD-10-CM | POA: Diagnosis not present

## 2014-10-29 DIAGNOSIS — I10 Essential (primary) hypertension: Secondary | ICD-10-CM | POA: Diagnosis not present

## 2014-10-29 DIAGNOSIS — H2512 Age-related nuclear cataract, left eye: Secondary | ICD-10-CM | POA: Diagnosis present

## 2014-10-29 DIAGNOSIS — Z981 Arthrodesis status: Secondary | ICD-10-CM | POA: Insufficient documentation

## 2014-10-29 DIAGNOSIS — Z96651 Presence of right artificial knee joint: Secondary | ICD-10-CM | POA: Diagnosis not present

## 2014-10-29 DIAGNOSIS — Z79899 Other long term (current) drug therapy: Secondary | ICD-10-CM | POA: Insufficient documentation

## 2014-10-29 DIAGNOSIS — J45909 Unspecified asthma, uncomplicated: Secondary | ICD-10-CM | POA: Insufficient documentation

## 2014-10-29 DIAGNOSIS — Z882 Allergy status to sulfonamides status: Secondary | ICD-10-CM | POA: Insufficient documentation

## 2014-10-29 DIAGNOSIS — F329 Major depressive disorder, single episode, unspecified: Secondary | ICD-10-CM | POA: Insufficient documentation

## 2014-10-29 DIAGNOSIS — E1140 Type 2 diabetes mellitus with diabetic neuropathy, unspecified: Secondary | ICD-10-CM | POA: Diagnosis not present

## 2014-10-29 DIAGNOSIS — N400 Enlarged prostate without lower urinary tract symptoms: Secondary | ICD-10-CM | POA: Insufficient documentation

## 2014-10-29 DIAGNOSIS — Z87891 Personal history of nicotine dependence: Secondary | ICD-10-CM | POA: Insufficient documentation

## 2014-10-29 DIAGNOSIS — M1990 Unspecified osteoarthritis, unspecified site: Secondary | ICD-10-CM | POA: Diagnosis not present

## 2014-10-29 DIAGNOSIS — Z794 Long term (current) use of insulin: Secondary | ICD-10-CM | POA: Diagnosis not present

## 2014-10-29 HISTORY — DX: Tremor, unspecified: R25.1

## 2014-10-29 HISTORY — DX: Calculus of kidney: N20.0

## 2014-10-29 HISTORY — DX: Atherosclerotic heart disease of native coronary artery without angina pectoris: I25.10

## 2014-10-29 HISTORY — DX: Essential (primary) hypertension: I10

## 2014-10-29 HISTORY — PX: CATARACT EXTRACTION W/PHACO: SHX586

## 2014-10-29 HISTORY — DX: Unspecified asthma, uncomplicated: J45.909

## 2014-10-29 HISTORY — DX: Major depressive disorder, single episode, unspecified: F32.9

## 2014-10-29 HISTORY — DX: Myoneural disorder, unspecified: G70.9

## 2014-10-29 HISTORY — DX: Benign prostatic hyperplasia without lower urinary tract symptoms: N40.0

## 2014-10-29 HISTORY — DX: Pure hypercholesterolemia, unspecified: E78.00

## 2014-10-29 HISTORY — DX: Barrett's esophagus without dysplasia: K22.70

## 2014-10-29 HISTORY — DX: Disorder of kidney and ureter, unspecified: N28.9

## 2014-10-29 HISTORY — DX: Gastro-esophageal reflux disease without esophagitis: K21.9

## 2014-10-29 HISTORY — DX: Presence of dental prosthetic device (complete) (partial): Z97.2

## 2014-10-29 HISTORY — DX: Type 2 diabetes mellitus without complications: E11.9

## 2014-10-29 HISTORY — DX: Sleep apnea, unspecified: G47.30

## 2014-10-29 HISTORY — DX: Unspecified osteoarthritis, unspecified site: M19.90

## 2014-10-29 HISTORY — DX: Depression, unspecified: F32.A

## 2014-10-29 LAB — GLUCOSE, CAPILLARY
GLUCOSE-CAPILLARY: 64 mg/dL — AB (ref 65–99)
Glucose-Capillary: 84 mg/dL (ref 65–99)
Glucose-Capillary: 63 mg/dL — ABNORMAL LOW (ref 65–99)
Glucose-Capillary: 64 mg/dL — ABNORMAL LOW (ref 65–99)
Glucose-Capillary: 160 mg/dL — ABNORMAL HIGH (ref 65–99)

## 2014-10-29 SURGERY — PHACOEMULSIFICATION, CATARACT, WITH IOL INSERTION
Anesthesia: Monitor Anesthesia Care | Laterality: Left | Wound class: Clean

## 2014-10-29 MED ORDER — DEXTROSE 50 % IV SOLN
25.0000 mL | Freq: Once | INTRAVENOUS | Status: AC
  Administered 2014-10-29: 25 mL via INTRAVENOUS

## 2014-10-29 MED ORDER — BRIMONIDINE TARTRATE 0.2 % OP SOLN
OPHTHALMIC | Status: DC | PRN
  Administered 2014-10-29: 1 [drp] via OPHTHALMIC

## 2014-10-29 MED ORDER — ARMC OPHTHALMIC DILATING GEL
1.0000 "application " | OPHTHALMIC | Status: DC | PRN
  Administered 2014-10-29 (×2): 1 via OPHTHALMIC

## 2014-10-29 MED ORDER — NA HYALUR & NA CHOND-NA HYALUR 0.4-0.35 ML IO KIT
PACK | INTRAOCULAR | Status: DC | PRN
  Administered 2014-10-29: 1 mL via INTRAOCULAR

## 2014-10-29 MED ORDER — CEFUROXIME OPHTHALMIC INJECTION 1 MG/0.1 ML
INJECTION | OPHTHALMIC | Status: DC | PRN
  Administered 2014-10-29: .3 mL via INTRACAMERAL

## 2014-10-29 MED ORDER — TETRACAINE HCL 0.5 % OP SOLN
1.0000 [drp] | OPHTHALMIC | Status: DC | PRN
  Administered 2014-10-29: 1 [drp] via OPHTHALMIC

## 2014-10-29 MED ORDER — FENTANYL CITRATE (PF) 100 MCG/2ML IJ SOLN
INTRAMUSCULAR | Status: DC | PRN
  Administered 2014-10-29: 50 ug via INTRAVENOUS

## 2014-10-29 MED ORDER — TIMOLOL MALEATE 0.5 % OP SOLN
OPHTHALMIC | Status: DC | PRN
  Administered 2014-10-29: 1 [drp] via OPHTHALMIC

## 2014-10-29 MED ORDER — POVIDONE-IODINE 5 % OP SOLN
1.0000 "application " | OPHTHALMIC | Status: DC | PRN
  Administered 2014-10-29: 1 via OPHTHALMIC

## 2014-10-29 MED ORDER — EPINEPHRINE HCL 1 MG/ML IJ SOLN
INTRAOCULAR | Status: DC | PRN
  Administered 2014-10-29: 104 mL via OPHTHALMIC

## 2014-10-29 MED ORDER — MIDAZOLAM HCL 2 MG/2ML IJ SOLN
INTRAMUSCULAR | Status: DC | PRN
  Administered 2014-10-29: 2 mg via INTRAVENOUS

## 2014-10-29 MED ORDER — LIDOCAINE HCL (PF) 4 % IJ SOLN
INTRAMUSCULAR | Status: DC | PRN
  Administered 2014-10-29: 1 mL via OPHTHALMIC

## 2014-10-29 SURGICAL SUPPLY — 26 items
CANNULA ANT/CHMB 27GA (MISCELLANEOUS) ×4 IMPLANT
GLOVE SURG LX 7.5 STRW (GLOVE) ×1
GLOVE SURG LX STRL 7.5 STRW (GLOVE) ×1 IMPLANT
GLOVE SURG TRIUMPH 8.0 PF LTX (GLOVE) ×2 IMPLANT
GOWN STRL REUS W/ TWL LRG LVL3 (GOWN DISPOSABLE) ×2 IMPLANT
GOWN STRL REUS W/TWL LRG LVL3 (GOWN DISPOSABLE) ×2
LENS IOL TECNIS 18.5 (Intraocular Lens) ×2 IMPLANT
LENS IOL TECNIS MONO 1P 18.5 (Intraocular Lens) ×1 IMPLANT
MARKER SKIN SURG W/RULER VIO (MISCELLANEOUS) ×2 IMPLANT
NDL RETROBULBAR .5 NSTRL (NEEDLE) IMPLANT
NEEDLE FILTER BLUNT 18X 1/2SAF (NEEDLE) ×1
NEEDLE FILTER BLUNT 18X1 1/2 (NEEDLE) ×1 IMPLANT
PACK CATARACT BRASINGTON (MISCELLANEOUS) ×2 IMPLANT
PACK EYE AFTER SURG (MISCELLANEOUS) ×2 IMPLANT
PACK OPTHALMIC (MISCELLANEOUS) ×2 IMPLANT
RING MALYGIN 7.0 (MISCELLANEOUS) ×2 IMPLANT
SUT ETHILON 10-0 CS-B-6CS-B-6 (SUTURE)
SUT VICRYL  9 0 (SUTURE)
SUT VICRYL 9 0 (SUTURE) IMPLANT
SUTURE EHLN 10-0 CS-B-6CS-B-6 (SUTURE) IMPLANT
SYR 3ML LL SCALE MARK (SYRINGE) ×2 IMPLANT
SYR 5ML LL (SYRINGE) ×2 IMPLANT
SYR TB 1ML LUER SLIP (SYRINGE) ×2 IMPLANT
WATER STERILE IRR 250ML POUR (IV SOLUTION) ×2 IMPLANT
WATER STERILE IRR 500ML POUR (IV SOLUTION) IMPLANT
WIPE NON LINTING 3.25X3.25 (MISCELLANEOUS) ×2 IMPLANT

## 2014-10-29 NOTE — H&P (Signed)
  The History and Physical notes were scanned in.  The patient remains stable and unchanged from the H&P.   Previous H&P reviewed, patient examined, and there are no changes.  Todd Davidson 10/29/2014 7:32 AM

## 2014-10-29 NOTE — Op Note (Signed)
OPERATIVE NOTE  [place] [id number] 10/29/2014  PREOPERATIVE DIAGNOSIS:   Nuclear sclerotic cataract left eye. H25.12   POSTOPERATIVE DIAGNOSIS:   Nuclear sclerotic cataract left eye.     PROCEDURE:  Phacoemulsification with posterior chamber intraocular lens implantation of the left eye which required pupil stretching with the Malyugin pupil expansion device   LENS:   Implant Name Type Inv. Item Serial No. Manufacturer Lot No. LRB No. Used  LENS IMPL INTRAOC [id number] 18.5 - [id number] Intraocular Lens LENS IMPL INTRAOC ZCB00 18.5 [id number] AMO   Left 1        ULTRASOUND TIME: 10.3 % of 1 minutes, 44 seconds.  CDE 10.9   SURGEON:  Wyonia Hough, MD   ANESTHESIA: Topical with tetracaine drops and 2% Xylocaine jelly, augmented with 1% preservative-free intracameral lidocaine.   COMPLICATIONS:  None.   DESCRIPTION OF PROCEDURE:  The patient was identified in the holding room and transported to the operating room and placed in the supine position under the operating microscope.  The left eye was identified as the operative eye and it was prepped and draped in the usual sterile ophthalmic fashion.   A 1 millimeter clear-corneal paracentesis was made at the 1:30 position.  The anterior chamber was filled with Viscoat viscoelastic.  0.5 ml of preservative-free 1% lidocaine was injected into the anterior chamber.  A 2.4 millimeter keratome was used to make a near-clear corneal incision at the 10:30 position.  A Malyugin pupil expander was then placed through the main incision and into the anterior chamber of the eye.  The edge of the iris was secured on the lip of the pupil expander and it was released, thereby expanding the pupil to approximately 7 millimeters for completion of the cataract surgery.  Additional Viscoat was placed in the anterior chamber.  A cystotome and capsulorrhexis forceps were used to make a curvilinear capsulorrhexis.   Balanced salt solution was used to  hydrodissect and hydrodelineate the lens nucleus.   Phacoemulsification was used in stop and chop fashion to remove the lens, nucleus and epinucleus.  The remaining cortex was aspirated using the irrigation aspiration handpiece.  Additional Provisc was placed into the eye to distend the capsular bag for lens placement.  A lens was then injected into the capsular bag.  The pupil expanding ring was removed using a Kuglen hook and insertion device. The remaining viscoelastic was aspirated from the capsular bag and the anterior chamber.  The anterior chamber was filled with balanced salt solution to inflate to a physiologic pressure.   Wounds were hydrated with balanced salt solution.  The anterior chamber was inflated to a physiologic pressure with balanced salt solution.  No wound leaks were noted. Cefuroxime 0.1 ml of a 10mg /ml solution was injected into the anterior chamber for a dose of 1 mg of intracameral antibiotic at the completion of the case.   Timolol and Brimonidine drops were applied to the eye.  The patient was taken to the recovery room in stable condition without complications of anesthesia or surgery.  Michel Eskelson 10/29/2014, 8:03 AM

## 2014-10-29 NOTE — Anesthesia Postprocedure Evaluation (Signed)
  Anesthesia Post-op Note  Patient: Todd Davidson  Procedure(s) Performed: Procedure(s) with comments: CATARACT EXTRACTION PHACO AND INTRAOCULAR LENS PLACEMENT (IOC) (Left) - DIABETIC - insulin MALYUGIN  Anesthesia type:MAC  Patient location: PACU  Post pain: Pain level controlled  Post assessment: Post-op Vital signs reviewed, Patient's Cardiovascular Status Stable, Respiratory Function Stable, Patent Airway and No signs of Nausea or vomiting  Post vital signs: Reviewed and stable  Last Vitals:  Filed Vitals:   10/29/14 0830  BP: 101/65  Pulse: 50  Temp:   Resp: 10    Level of consciousness: awake, alert  and patient cooperative  Complications: No apparent anesthesia complications

## 2014-10-29 NOTE — Anesthesia Preprocedure Evaluation (Signed)
Anesthesia Evaluation   Patient awake    Reviewed: Allergy & Precautions, H&P , NPO status , Patient's Chart, lab work & pertinent test results, reviewed documented beta blocker date and time   History of Anesthesia Complications Negative for: history of anesthetic complications  Airway Mallampati: II  TM Distance: >3 FB     Dental   Pulmonary asthma , sleep apnea , former smoker,    Pulmonary exam normal       Cardiovascular hypertension, + Cardiac Stents Normal cardiovascular exam Two stents early 2000s- no problems since.  On full strength aspirin.  Denies CP/SOB.   Neuro/Psych    GI/Hepatic Neg liver ROS, GERD-  Medicated,  Endo/Other  diabetes  Renal/GU      Musculoskeletal   Abdominal   Peds  Hematology negative hematology ROS (+)   Anesthesia Other Findings   Reproductive/Obstetrics                             Anesthesia Physical Anesthesia Plan  ASA: III  Anesthesia Plan: MAC   Post-op Pain Management:    Induction:   Airway Management Planned:   Additional Equipment:   Intra-op Plan:   Post-operative Plan:   Informed Consent:   Plan Discussed with: CRNA  Anesthesia Plan Comments:         Anesthesia Quick Evaluation

## 2014-10-29 NOTE — Anesthesia Procedure Notes (Signed)
Procedure Name: MAC Performed by: Jeriah Corkum Pre-anesthesia Checklist: Patient identified, Emergency Drugs available, Suction available, Timeout performed and Patient being monitored Patient Re-evaluated:Patient Re-evaluated prior to inductionOxygen Delivery Method: Nasal cannula Placement Confirmation: positive ETCO2     

## 2014-10-29 NOTE — Transfer of Care (Signed)
Immediate Anesthesia Transfer of Care Note  Patient: Todd Davidson  Procedure(s) Performed: Procedure(s) with comments: CATARACT EXTRACTION PHACO AND INTRAOCULAR LENS PLACEMENT (IOC) (Left) - DIABETIC - insulin MALYUGIN  Patient Location: PACU  Anesthesia Type: MAC  Level of Consciousness: awake, alert  and patient cooperative  Airway and Oxygen Therapy: Patient Spontanous Breathing and Patient connected to supplemental oxygen  Post-op Assessment: Post-op Vital signs reviewed, Patient's Cardiovascular Status Stable, Respiratory Function Stable, Patent Airway and No signs of Nausea or vomiting  Post-op Vital Signs: Reviewed and stable  Complications: No apparent anesthesia complications

## 2014-10-30 ENCOUNTER — Encounter: Payer: Self-pay | Admitting: Ophthalmology

## 2015-04-06 ENCOUNTER — Encounter (HOSPITAL_COMMUNITY): Payer: Self-pay | Admitting: Emergency Medicine

## 2015-04-06 ENCOUNTER — Emergency Department (HOSPITAL_COMMUNITY)
Admission: EM | Admit: 2015-04-06 | Discharge: 2015-04-06 | Disposition: A | Discharge status: Home / Self Care | Payer: Medicare Other | Attending: Emergency Medicine | Admitting: Emergency Medicine

## 2015-04-06 DIAGNOSIS — Z79899 Other long term (current) drug therapy: Secondary | ICD-10-CM | POA: Diagnosis not present

## 2015-04-06 DIAGNOSIS — Z9889 Other specified postprocedural states: Secondary | ICD-10-CM | POA: Insufficient documentation

## 2015-04-06 DIAGNOSIS — F329 Major depressive disorder, single episode, unspecified: Secondary | ICD-10-CM | POA: Diagnosis not present

## 2015-04-06 DIAGNOSIS — Z7982 Long term (current) use of aspirin: Secondary | ICD-10-CM | POA: Insufficient documentation

## 2015-04-06 DIAGNOSIS — R001 Bradycardia, unspecified: Secondary | ICD-10-CM | POA: Insufficient documentation

## 2015-04-06 DIAGNOSIS — Z87891 Personal history of nicotine dependence: Secondary | ICD-10-CM | POA: Insufficient documentation

## 2015-04-06 DIAGNOSIS — R531 Weakness: Secondary | ICD-10-CM | POA: Diagnosis present

## 2015-04-06 DIAGNOSIS — I10 Essential (primary) hypertension: Secondary | ICD-10-CM | POA: Insufficient documentation

## 2015-04-06 DIAGNOSIS — Z794 Long term (current) use of insulin: Secondary | ICD-10-CM | POA: Insufficient documentation

## 2015-04-06 DIAGNOSIS — E7800 Pure hypercholesterolemia, unspecified: Secondary | ICD-10-CM | POA: Diagnosis not present

## 2015-04-06 DIAGNOSIS — K219 Gastro-esophageal reflux disease without esophagitis: Secondary | ICD-10-CM | POA: Insufficient documentation

## 2015-04-06 DIAGNOSIS — Z87442 Personal history of urinary calculi: Secondary | ICD-10-CM | POA: Diagnosis not present

## 2015-04-06 DIAGNOSIS — E119 Type 2 diabetes mellitus without complications: Secondary | ICD-10-CM | POA: Insufficient documentation

## 2015-04-06 DIAGNOSIS — J45909 Unspecified asthma, uncomplicated: Secondary | ICD-10-CM | POA: Diagnosis not present

## 2015-04-06 DIAGNOSIS — R4182 Altered mental status, unspecified: Secondary | ICD-10-CM | POA: Diagnosis not present

## 2015-04-06 DIAGNOSIS — Z8669 Personal history of other diseases of the nervous system and sense organs: Secondary | ICD-10-CM | POA: Insufficient documentation

## 2015-04-06 DIAGNOSIS — Z98811 Dental restoration status: Secondary | ICD-10-CM | POA: Insufficient documentation

## 2015-04-06 DIAGNOSIS — M1990 Unspecified osteoarthritis, unspecified site: Secondary | ICD-10-CM | POA: Insufficient documentation

## 2015-04-06 DIAGNOSIS — N400 Enlarged prostate without lower urinary tract symptoms: Secondary | ICD-10-CM | POA: Diagnosis not present

## 2015-04-06 DIAGNOSIS — I2510 Atherosclerotic heart disease of native coronary artery without angina pectoris: Secondary | ICD-10-CM | POA: Insufficient documentation

## 2015-04-06 LAB — CBC WITH DIFFERENTIAL/PLATELET
BASOS ABS: 0.2 10*3/uL — AB (ref 0.0–0.1)
Basophils Relative: 2 %
EOS PCT: 1 %
Eosinophils Absolute: 0.1 10*3/uL (ref 0.0–0.7)
HCT: 40.8 % (ref 39.0–52.0)
Hemoglobin: 13.9 g/dL (ref 13.0–17.0)
Lymphocytes Relative: 12 %
Lymphs Abs: 1 10*3/uL (ref 0.7–4.0)
MCH: 28.8 pg (ref 26.0–34.0)
MCHC: 34.1 g/dL (ref 30.0–36.0)
MCV: 84.6 fL (ref 78.0–100.0)
MONO ABS: 0.3 10*3/uL (ref 0.1–1.0)
Monocytes Relative: 4 %
NEUTROS ABS: 7 10*3/uL (ref 1.7–7.7)
Neutrophils Relative %: 82 %
PLATELETS: 116 10*3/uL — AB (ref 150–400)
RBC: 4.82 MIL/uL (ref 4.22–5.81)
RDW: 14.4 % (ref 11.5–15.5)
WBC: 8.6 10*3/uL (ref 4.0–10.5)

## 2015-04-06 LAB — BASIC METABOLIC PANEL
Anion gap: 8 (ref 5–15)
BUN: 35 mg/dL — ABNORMAL HIGH (ref 6–20)
CALCIUM: 9.4 mg/dL (ref 8.9–10.3)
CO2: 23 mmol/L (ref 22–32)
Chloride: 109 mmol/L (ref 101–111)
Creatinine, Ser: 1.04 mg/dL (ref 0.61–1.24)
GFR calc Af Amer: >60 mL/min (ref >60)
GFR calc non Af Amer: >60 mL/min (ref >60)
Glucose, Bld: 156 mg/dL — ABNORMAL HIGH (ref 65–99)
Potassium: 4.3 mmol/L (ref 3.5–5.1)
SODIUM: 140 mmol/L (ref 135–145)

## 2015-04-06 NOTE — Discharge Instructions (Signed)
You had an episode of bradycardia today. This is a low heart rate. This is likely related to being on a beta blocker. I have spoken with her cardiologist who recommends not taking your metoprolol until you see him in the office tomorrow morning at 10:00. He had any episodes of passing out, weakness, fainting, low blood pressure, sweating, chest pain or altered mental status please return to the emergency department immediately.

## 2015-04-06 NOTE — ED Provider Notes (Signed)
CSN: [id number]     Arrival date & time 04/06/15  1044 History   First MD Initiated Contact with Patient 04/06/15 1049     Chief Complaint  Patient presents with  . Weakness     (Consider location/radiation/quality/duration/timing/severity/associated sxs/prior Treatment) Patient is a 80 y.o. male presenting with altered mental status.  Altered Mental Status Severity:  Mild Most recent episode:  Today Episode history:  Single Duration:  15 minutes Progression:  Resolved Chronicity:  New Context comment:  After SQ lidocaine, before procedure Associated symptoms: no abdominal pain, no fever and no hallucinations     Past Medical History  Diagnosis Date  . Hypertension   . Sleep apnea     doesn't wear CPAP  . Asthma   . Neuromuscular disorder (HCC)     neuropathy - feet  . Occasional tremors     hands  . GERD (gastroesophageal reflux disease)   . Arthritis     neck, shoulders, hips, knees  . BPH (benign prostatic hyperplasia)   . Depression   . Diabetes mellitus without complication ([place])   . Renal insufficiency   . Kidney stone   . Barrett esophagus   . Hypercholesteremia   . Wears dentures     full upper  . Coronary artery disease     annual stress with Dr. Humphrey Rolls. no new findings   Past Surgical History  Procedure Laterality Date  . Cardiac catheterization  1999, 2000    stents placed  . Hernia repair    . Joint replacement      knee  . Knee surgery Right     joint removal with fusion and graft  . Cervical fusion    . Nissen fundoplication    . Cataract extraction w/phaco Left 10/29/2014    Procedure: CATARACT EXTRACTION PHACO AND INTRAOCULAR LENS PLACEMENT (IOC);  Surgeon: Leandrew Koyanagi, MD;  Location: [place];  Service: Ophthalmology;  Laterality: Left;  DIABETIC - insulin MALYUGIN   History reviewed. No pertinent family history. Social History  Substance Use Topics  . Smoking status: Former Smoker    Quit date: 03/21/1973  .  Smokeless tobacco: None  . Alcohol Use: 1.2 oz/week    2 Cans of beer per week    Review of Systems  Constitutional: Negative for fever.  Respiratory: Negative for cough and shortness of breath.   Cardiovascular: Negative for chest pain and leg swelling.  Gastrointestinal: Negative for abdominal pain.  Psychiatric/Behavioral: Negative for hallucinations.  All other systems reviewed and are negative.     Allergies  Adhesive; Macrolides and ketolides; and Sulfa antibiotics  Home Medications   Prior to Admission medications   Medication Sig Start Date End Date Taking? Authorizing Provider  amLODipine (NORVASC) 10 MG tablet Take 10 mg by mouth daily. AM   Yes Historical Provider, MD  aspirin 325 MG tablet Take 325 mg by mouth daily. AM   Yes Historical Provider, MD  atorvastatin (LIPITOR) 80 MG tablet Take 80 mg by mouth daily. PM   Yes Historical Provider, MD  benazepril (LOTENSIN) 20 MG tablet Take 20 mg by mouth 2 (two) times daily. PM   Yes Historical Provider, MD  clonazePAM (KLONOPIN) 0.5 MG tablet Take 0.5 mg by mouth at bedtime.   Yes Historical Provider, MD  FLUoxetine (PROZAC) 10 MG capsule Take 10 mg by mouth daily. 04/02/15  Yes Historical Provider, MD  gabapentin (NEURONTIN) 300 MG capsule Take 300 mg by mouth 2 (two) times daily.   Yes Historical  Provider, MD  insulin aspart protamine- aspart (NOVOLOG MIX 70/30) (70-30) 100 UNIT/ML injection Inject 12-28 Units into the skin 2 (two) times daily with a meal. 12 units before breakfast and 28 units before dinner 03/24/15  Yes Historical Provider, MD  magnesium oxide (MAG-OX) 400 MG tablet Take 400 mg by mouth daily. PM   Yes Historical Provider, MD  mirtazapine (REMERON) 15 MG tablet Take 15 mg by mouth at bedtime.  04/04/15  Yes Historical Provider, MD  omeprazole (PRILOSEC) 40 MG capsule Take 40 mg by mouth daily. PM   Yes Historical Provider, MD  Probiotic Product (PROBIOTIC DAILY PO) Take 1 tablet by mouth at bedtime.   Yes  Historical Provider, MD  tamsulosin (FLOMAX) 0.4 MG CAPS capsule Take 0.4 mg by mouth daily after breakfast.   Yes Historical Provider, MD   BP 128/70 mmHg  Pulse 60  Temp(Src) 97.7 F (36.5 C) (Oral)  Resp 18  SpO2 97% Physical Exam  Constitutional: He is oriented to person, place, and time. He appears well-developed and well-nourished.  HENT:  Head: Normocephalic and atraumatic.  Neck: Normal range of motion.  Cardiovascular: Normal rate, regular rhythm, normal heart sounds and intact distal pulses.  Exam reveals no gallop and no friction rub.   No murmur heard. Pulmonary/Chest: Effort normal. No respiratory distress.  Abdominal: Soft. He exhibits no distension.  Musculoskeletal: Normal range of motion. He exhibits no edema or tenderness.  Neurological: He is alert and oriented to person, place, and time. No cranial nerve deficit. Coordination normal.  No altered mental status, able to give full seemingly accurate history.  Face is assymmetric (loss of wrinkles on left forehead, chronic), EOM's intact, pupils equal and reactive, vision intact, tongue and uvula midline without deviation Upper and Lower extremity motor 5/5, intact pain perception in distal extremities, 2+ reflexes in biceps, patella and achilles tendons. Finger to nose normal, heel to shin normal. Walks without assistance or evident ataxia.   Nursing note and vitals reviewed.   ED Course  Procedures (including critical care time) Labs Review Labs Reviewed  CBC WITH DIFFERENTIAL/PLATELET - Abnormal; Notable for the following:    Platelets 116 (*)    Basophils Absolute 0.2 (*)    All other components within normal limits  BASIC METABOLIC PANEL - Abnormal; Notable for the following:    Glucose, Bld 156 (*)    BUN 35 (*)    All other components within normal limits    Imaging Review No results found. I have personally reviewed and evaluated these images and lab results as part of my medical decision-making.    EKG Interpretation   Date/Time:  Monday April 06 2015 10:48:31 EST Ventricular Rate:  55 PR Interval:  140 QRS Duration: 112 QT Interval:  458 QTC Calculation: 438 R Axis:   60 Text Interpretation:  Sinus bradycardia Otherwise normal ECG No  significant change since last tracing Confirmed by [facility] MD, Corene Cornea  [phone]) on 04/06/2015 11:46:18 AM      MDM   Final diagnoses:  Bradycardia    Episode of bradycardia at [place] office, not c/w vagal episode. On toprol at home. Returned to normal at approx 15 minutes. BP normal whole time. No AMS aside from decreased energy. No h/o same. Exam benign here. Neuro exam intact, initially thought to have some left forehead droop, however after taking a picture of his forehead and showing it to him he stated it was his normal. observrd in ED for >3 hours with lowest BP of  50. D/W his cardiologist Dr. Humphrey Rolls in [place] who recommended holding metoprolol and seeing him in the office tomorrow morning at 1000 which patient was advised. Stable for dc. Will return here for new/worsening symptoms.     Merrily Pew, MD 04/06/15 [phone]

## 2015-04-06 NOTE — ED Notes (Signed)
Neurology at bedside.

## 2015-04-06 NOTE — ED Notes (Signed)
Pt from [facility] via [place] with c/o weakness following being given lidocaine for a procedure.  Staff reports pts HR was in the 60s prior to being given medication and decreased to the 30s and a decrease in BP to 103/60.  Pt has no SOB, CP, or other complaints except weakness.  NAD, A&O.

## 2015-04-06 NOTE — ED Notes (Signed)
MD at bedside. 

## 2015-10-13 ENCOUNTER — Other Ambulatory Visit: Payer: Self-pay | Admitting: Internal Medicine

## 2015-10-13 ENCOUNTER — Ambulatory Visit
Admission: RE | Admit: 2015-10-13 | Discharge: 2015-10-13 | Disposition: A | Discharge status: Home / Self Care | Payer: Medicare Other | Source: Ambulatory Visit | Attending: Internal Medicine | Admitting: Internal Medicine

## 2015-10-13 DIAGNOSIS — R05 Cough: Secondary | ICD-10-CM | POA: Insufficient documentation

## 2015-10-13 DIAGNOSIS — J45909 Unspecified asthma, uncomplicated: Secondary | ICD-10-CM | POA: Diagnosis not present

## 2015-10-13 DIAGNOSIS — I700 Atherosclerosis of aorta: Secondary | ICD-10-CM | POA: Insufficient documentation

## 2015-10-13 DIAGNOSIS — R059 Cough, unspecified: Secondary | ICD-10-CM

## 2015-10-19 ENCOUNTER — Emergency Department
Admission: EM | Admit: 2015-10-19 | Discharge: 2015-10-19 | Disposition: A | Discharge status: Home / Self Care | Payer: Medicare Other | Attending: Emergency Medicine | Admitting: Emergency Medicine

## 2015-10-19 ENCOUNTER — Encounter: Payer: Self-pay | Admitting: Emergency Medicine

## 2015-10-19 DIAGNOSIS — Y939 Activity, unspecified: Secondary | ICD-10-CM | POA: Diagnosis not present

## 2015-10-19 DIAGNOSIS — Z79899 Other long term (current) drug therapy: Secondary | ICD-10-CM | POA: Diagnosis not present

## 2015-10-19 DIAGNOSIS — Z794 Long term (current) use of insulin: Secondary | ICD-10-CM | POA: Diagnosis not present

## 2015-10-19 DIAGNOSIS — Y999 Unspecified external cause status: Secondary | ICD-10-CM | POA: Insufficient documentation

## 2015-10-19 DIAGNOSIS — Y929 Unspecified place or not applicable: Secondary | ICD-10-CM | POA: Insufficient documentation

## 2015-10-19 DIAGNOSIS — Z23 Encounter for immunization: Secondary | ICD-10-CM | POA: Insufficient documentation

## 2015-10-19 DIAGNOSIS — E119 Type 2 diabetes mellitus without complications: Secondary | ICD-10-CM | POA: Insufficient documentation

## 2015-10-19 DIAGNOSIS — S81811A Laceration without foreign body, right lower leg, initial encounter: Secondary | ICD-10-CM | POA: Diagnosis not present

## 2015-10-19 DIAGNOSIS — S8991XA Unspecified injury of right lower leg, initial encounter: Secondary | ICD-10-CM | POA: Diagnosis present

## 2015-10-19 DIAGNOSIS — W268XXA Contact with other sharp object(s), not elsewhere classified, initial encounter: Secondary | ICD-10-CM | POA: Insufficient documentation

## 2015-10-19 DIAGNOSIS — Z87891 Personal history of nicotine dependence: Secondary | ICD-10-CM | POA: Insufficient documentation

## 2015-10-19 DIAGNOSIS — J45909 Unspecified asthma, uncomplicated: Secondary | ICD-10-CM | POA: Insufficient documentation

## 2015-10-19 DIAGNOSIS — I2510 Atherosclerotic heart disease of native coronary artery without angina pectoris: Secondary | ICD-10-CM | POA: Insufficient documentation

## 2015-10-19 DIAGNOSIS — I10 Essential (primary) hypertension: Secondary | ICD-10-CM | POA: Diagnosis not present

## 2015-10-19 DIAGNOSIS — Z7982 Long term (current) use of aspirin: Secondary | ICD-10-CM | POA: Insufficient documentation

## 2015-10-19 DIAGNOSIS — IMO0002 Reserved for concepts with insufficient information to code with codable children: Secondary | ICD-10-CM

## 2015-10-19 MED ORDER — LIDOCAINE-EPINEPHRINE (PF) 1 %-1:200000 IJ SOLN
30.0000 mL | Freq: Once | INTRAMUSCULAR | Status: DC
  Filled 2015-10-19: qty 30

## 2015-10-19 MED ORDER — OXYCODONE-ACETAMINOPHEN 5-325 MG PO TABS
2.0000 | ORAL_TABLET | Freq: Once | ORAL | Status: AC
  Administered 2015-10-19: 2 via ORAL
  Filled 2015-10-19: qty 2

## 2015-10-19 MED ORDER — CEPHALEXIN 250 MG PO CAPS: 250.0000 mg | ORAL_CAPSULE | Freq: Four times a day (QID) | ORAL | 0 refills | Status: AC

## 2015-10-19 MED ORDER — TETANUS-DIPHTH-ACELL PERTUSSIS 5-2.5-18.5 LF-MCG/0.5 IM SUSP
0.5000 mL | Freq: Once | INTRAMUSCULAR | Status: AC
  Administered 2015-10-19: 0.5 mL via INTRAMUSCULAR
  Filled 2015-10-19 (×2): qty 0.5

## 2015-10-19 MED ORDER — MUPIROCIN 2 % EX OINT: TOPICAL_OINTMENT | CUTANEOUS | 0 refills | Status: DC

## 2015-10-19 MED ORDER — BACITRACIN ZINC 500 UNIT/GM EX OINT
TOPICAL_OINTMENT | CUTANEOUS | Status: AC
  Administered 2015-10-19: 21:00:00
  Filled 2015-10-19: qty 0.9

## 2015-10-19 NOTE — ED Provider Notes (Signed)
[facility] Emergency Department Provider Note        Time seen: ----------------------------------------- 8:34 PM on 10/19/2015 -----------------------------------------    I have reviewed the triage vital signs and the nursing notes.   HISTORY  Chief Complaint Laceration    HPI Todd Davidson is a 80 y.o. male who presents to ER for laceration on his right lower leg. Patient had the outer part of the lower leg on a lawnmower just prior to arrival. Bandage was noted over the wound, he has approximately 8 cm laceration. He states his been bleeding but is not having severe pain. He does have some pain with walking.Patient currently takes aspirin daily   Past Medical History:  Diagnosis Date  . Arthritis    neck, shoulders, hips, knees  . Asthma   . Barrett esophagus   . BPH (benign prostatic hyperplasia)   . Coronary artery disease    annual stress with Dr. Humphrey Rolls. no new findings  . Depression   . Diabetes mellitus without complication ([place])   . GERD (gastroesophageal reflux disease)   . Hypercholesteremia   . Hypertension   . Kidney stone   . Neuromuscular disorder (HCC)    neuropathy - feet  . Occasional tremors    hands  . Renal insufficiency   . Sleep apnea    doesn't wear CPAP  . Wears dentures    full upper    There are no active problems to display for this patient.   Past Surgical History:  Procedure Laterality Date  . CARDIAC CATHETERIZATION  1999, 2000   stents placed  . CATARACT EXTRACTION W/PHACO Left 10/29/2014   Procedure: CATARACT EXTRACTION PHACO AND INTRAOCULAR LENS PLACEMENT (IOC);  Surgeon: Leandrew Koyanagi, MD;  Location: [place];  Service: Ophthalmology;  Laterality: Left;  DIABETIC - insulin MALYUGIN  . CERVICAL FUSION    . HERNIA REPAIR    . JOINT REPLACEMENT     knee  . KNEE SURGERY Right    joint removal with fusion and graft  . NISSEN FUNDOPLICATION      Allergies Adhesive [tape];  Macrolides and ketolides; and Sulfa antibiotics  Social History Social History  Substance Use Topics  . Smoking status: Former Smoker    Quit date: 03/21/1973  . Smokeless tobacco: Not on file  . Alcohol use 1.2 oz/week    2 Cans of beer per week    Review of Systems Constitutional: Negative for fever. Musculoskeletal: Positive for right leg pain, laceration Skin: Positive for right leg laceration Neurological: Negative for focal weakness or numbness  ____________________________________________   PHYSICAL EXAM:  VITAL SIGNS: ED Triage Vitals  Enc Vitals Group     BP 10/19/15 1849 133/65     Pulse Rate 10/19/15 1849 77     Resp 10/19/15 1849 16     Temp 10/19/15 1849 98.2 F (36.8 C)     Temp Source 10/19/15 1849 Oral     SpO2 10/19/15 1849 95 %     Weight 10/19/15 1849 160 lb (72.6 kg)     Height 10/19/15 1849 6' (1.829 m)     Head Circumference --      Peak Flow --      Pain Score 10/19/15 1855 5     Pain Loc --      Pain Edu? --      Excl. in [place]? --     Constitutional: Alert and oriented. Well appearing and in no distress. Musculoskeletal: There is some tenderness  noted over the distal right lower leg anteriorly. Obvious laceration over the distal right lower leg. Neurologic:  Normal speech and language. No gross focal neurologic deficits are appreciated.  Skin:  8 cm laceration noted anteriorly over the right lower leg. Mild bleeding is noted Psychiatric: Mood and affect are normal. Speech and behavior are normal.  ____________________________________________  ED COURSE:  Pertinent labs & imaging results that were available during my care of the patient were reviewed by me and considered in my medical decision making (see chart for details). Clinical Course  Patient is no acute distress, will require wound repair. He'll be given oral pain medicine.  Marland Kitchen.Laceration Repair Date/Time: 10/19/2015 9:03 PM Performed by: Earleen Newport Authorized by: Lenise Arena E   Consent:    Consent obtained:  Verbal   Consent given by:  Patient   Risks discussed:  Infection and pain   Alternatives discussed:  No treatment Anesthesia (see MAR for exact dosages):    Anesthesia method:  Local infiltration   Local anesthetic:  Lidocaine 1% WITH epi Laceration details:    Location:  Leg   Leg location:  R lower leg   Length (cm):  8   Depth (mm):  5 Pre-procedure details:    Preparation:  Patient was prepped and draped in usual sterile fashion Exploration:    Wound exploration: entire depth of wound probed and visualized     Contaminated: no   Treatment:    Area cleansed with:  Betadine and saline   Amount of cleaning:  Standard   Irrigation solution:  Sterile saline   Irrigation method:  Syringe   Visualized foreign bodies/material removed: no   Approximation:    Approximation:  Close Post-procedure details:    Dressing:  Non-adherent dressing   Patient tolerance of procedure:  Tolerated well, no immediate complications   ___________________________________________  FINAL ASSESSMENT AND PLAN  Right leg laceration  Plan: Patient with labs and imaging as dictated above. Patient is in no acute distress, wound has been reapproximated. We have applied a dressing, he'll be prescribed Keflex as well as Bactroban. He is stable for outpatient follow-up. Suture removal in 7-10 days. He was given a tetanus shot.   Earleen Newport, MD   Note: This dictation was prepared with Dragon dictation. Any transcriptional errors that result from this process are unintentional    Earleen Newport, MD 10/19/15 2105

## 2015-10-19 NOTE — ED Notes (Signed)
Antibiotic dressing applied to wound on right lower leg. Wound care instructions reviewed with patient which verbalized understanding.

## 2015-10-19 NOTE — ED Notes (Signed)
Patient discharged to home per MD order. Patient in stable condition, and deemed medically cleared by ED provider for discharge. Discharge instructions reviewed with patient/family using "Teach Back"; verbalized understanding of medication education and administration, and information about follow-up care. Denies further concerns. ° °

## 2015-10-19 NOTE — ED Triage Notes (Signed)
Hit outer lower leg on protrusion on lawnmower just prior to arrival, 3x3 in bandage over wound R lower leg, blood noted on pad but not bleeding past bandage. Bandage not removed in triage to prevent starting re bleed.

## 2015-11-24 ENCOUNTER — Encounter: Payer: Medicare Other | Attending: Internal Medicine | Admitting: Internal Medicine

## 2015-11-24 DIAGNOSIS — G4730 Sleep apnea, unspecified: Secondary | ICD-10-CM | POA: Diagnosis not present

## 2015-11-24 DIAGNOSIS — I2510 Atherosclerotic heart disease of native coronary artery without angina pectoris: Secondary | ICD-10-CM | POA: Diagnosis not present

## 2015-11-24 DIAGNOSIS — I87311 Chronic venous hypertension (idiopathic) with ulcer of right lower extremity: Secondary | ICD-10-CM | POA: Diagnosis not present

## 2015-11-24 DIAGNOSIS — X58XXXD Exposure to other specified factors, subsequent encounter: Secondary | ICD-10-CM | POA: Diagnosis not present

## 2015-11-24 DIAGNOSIS — J45909 Unspecified asthma, uncomplicated: Secondary | ICD-10-CM | POA: Insufficient documentation

## 2015-11-24 DIAGNOSIS — N400 Enlarged prostate without lower urinary tract symptoms: Secondary | ICD-10-CM | POA: Diagnosis not present

## 2015-11-24 DIAGNOSIS — Z794 Long term (current) use of insulin: Secondary | ICD-10-CM | POA: Insufficient documentation

## 2015-11-24 DIAGNOSIS — M1990 Unspecified osteoarthritis, unspecified site: Secondary | ICD-10-CM | POA: Diagnosis not present

## 2015-11-24 DIAGNOSIS — S86821D Laceration of other muscle(s) and tendon(s) at lower leg level, right leg, subsequent encounter: Secondary | ICD-10-CM | POA: Diagnosis not present

## 2015-11-24 DIAGNOSIS — Z87891 Personal history of nicotine dependence: Secondary | ICD-10-CM | POA: Diagnosis not present

## 2015-11-24 DIAGNOSIS — I10 Essential (primary) hypertension: Secondary | ICD-10-CM | POA: Insufficient documentation

## 2015-11-24 DIAGNOSIS — E1140 Type 2 diabetes mellitus with diabetic neuropathy, unspecified: Secondary | ICD-10-CM | POA: Insufficient documentation

## 2015-11-24 NOTE — Progress Notes (Signed)
Todd Davidson, Todd Davidson ([id number]) Visit Report for 11/24/2015 Abuse/Suicide Risk Screen Details Patient Name: Todd Davidson, Todd Davidson. Date of Service: 11/24/2015 2:15 PM Medical Record Patient Account Number: [id number] Number: Treating RN: Ahmed Prima 1935/12/14 (79 y.o. Other Clinician: Date of Birth/Sex: Male) Treating ROBSON, [place] Primary Care Physician: Lamonte Sakai Physician/Extender: G Referring Physician: Kasandra Knudsen Weeks in Treatment: 0 Abuse/Suicide Risk Screen Items Answer ABUSE/SUICIDE RISK SCREEN: Has anyone close to you tried to hurt or harm you recentlyo No Do you feel uncomfortable with anyone in your familyo No Has anyone forced you do things that you didnot want to doo No Do you have any thoughts of harming yourselfo No Patient displays signs or symptoms of abuse and/or neglect. No Electronic Signature(s) Signed: 11/24/2015 4:41:03 PM By: Alric Quan Entered By: Alric Quan on 11/24/2015 14:20:49 Todd Davidson ([id number]) -------------------------------------------------------------------------------- Activities of Daily Living Details Patient Name: Todd Davidson Date of Service: 11/24/2015 2:15 PM Medical Record Patient Account Number: [id number] Number: Treating RN: Ahmed Prima Oct 20, 1935 (79 y.o. Other Clinician: Date of Birth/Sex: Male) Treating ROBSON, [place] Primary Care Physician: Lamonte Sakai Physician/Extender: G Referring Physician: Kasandra Knudsen Weeks in Treatment: 0 Activities of Daily Living Items Answer Activities of Daily Living (Please select one for each item) Drive Automobile Completely Able Take Medications Completely Able Use Telephone Completely Able Care for Appearance Completely Able Use Toilet Completely Able Bath / Shower Completely Able Dress Self Completely Able Feed Self Completely Able Walk Completely Able Get In / Out Bed Completely Able Housework Completely Able Prepare Meals Completely  [place] for Self Completely Able Electronic Signature(s) Signed: 11/24/2015 4:41:03 PM By: Alric Quan Entered By: Alric Quan on 11/24/2015 14:21:09 Todd Davidson ([id number]) -------------------------------------------------------------------------------- Education Assessment Details Patient Name: Todd Davidson Date of Service: 11/24/2015 2:15 PM Medical Record Patient Account Number: [id number] Number: Treating RN: Ahmed Prima 1935-06-14 (79 y.o. Other Clinician: Date of Birth/Sex: Male) Treating ROBSON, [place] Primary Care Physician: Lamonte Sakai Physician/Extender: G Referring Physician: Kasandra Knudsen Weeks in Treatment: 0 Primary Learner Assessed: Patient Learning Preferences/Education Level/Primary Language Learning Preference: Explanation, Printed Material Highest Education Level: College or Above Preferred Language: English Cognitive Barrier Assessment/Beliefs Language Barrier: No Translator Needed: No Memory Deficit: No Emotional Barrier: No Cultural/Religious Beliefs Affecting Medical No Care: Physical Barrier Assessment Impaired Vision: Yes Glasses, one eye Impaired Hearing: No Decreased Hand dexterity: No Knowledge/Comprehension Assessment Knowledge Level: High Comprehension Level: High Ability to understand written High instructions: Ability to understand verbal High instructions: Motivation Assessment Anxiety Level: Calm Cooperation: Cooperative Education Importance: Acknowledges Need Interest in Health Problems: Asks Questions Perception: Coherent Willingness to Engage in Self- High Management Activities: Readiness to Engage in Self- High Management Activities: Todd Davidson ([id number]) Electronic Signature(s) Signed: 11/24/2015 4:41:03 PM By: Alric Quan Entered By: Alric Quan on 11/24/2015 14:22:40 Todd Davidson  ([id number]) -------------------------------------------------------------------------------- Fall Risk Assessment Details Patient Name: Todd Davidson Date of Service: 11/24/2015 2:15 PM Medical Record Patient Account Number: [id number] Number: Treating RN: Ahmed Prima 02-28-1936 (79 y.o. Other Clinician: Date of Birth/Sex: Male) Treating ROBSON, [place] Primary Care Physician: Lamonte Sakai Physician/Extender: G Referring Physician: Kasandra Knudsen Weeks in Treatment: 0 Fall Risk Assessment Items Have you had 2 or more falls in the last 12 monthso 0 Yes Have you had any fall that resulted in injury in the last 12 monthso 0 Yes FALL RISK ASSESSMENT: History of falling - immediate or within 3 months 25 Yes Secondary diagnosis 15 Yes Ambulatory aid  None/bed rest/wheelchair/nurse 0 No Crutches/cane/walker 0 No Furniture 0 No IV Access/Saline Lock 0 No Gait/Training Normal/bed rest/immobile 0 No Weak 10 Yes Impaired 20 Yes Mental Status Oriented to own ability 0 Yes Electronic Signature(s) Signed: 11/24/2015 4:41:03 PM By: Alric Quan Entered By: Alric Quan on 11/24/2015 14:22:56 Todd Davidson ([id number]) -------------------------------------------------------------------------------- Foot Assessment Details Patient Name: Todd Davidson Date of Service: 11/24/2015 2:15 PM Medical Record Patient Account Number: [id number] Number: Treating RN: Ahmed Prima 1936/03/09 (79 y.o. Other Clinician: Date of Birth/Sex: Male) Treating ROBSON, [place] Primary Care Physician: Lamonte Sakai Physician/Extender: G Referring Physician: Kasandra Knudsen Weeks in Treatment: 0 Foot Assessment Items Site Locations + = Sensation present, - = Sensation absent, C = Callus, U = Ulcer R = Redness, W = Warmth, M = Maceration, PU = Pre-ulcerative lesion F = Fissure, S = Swelling, D = Dryness Assessment Right: Left: Other Deformity: No No Prior Foot Ulcer: No  No Prior Amputation: No No Charcot Joint: No No Ambulatory Status: Ambulatory With Help Assistance Device: Cane Gait: Steady Electronic Signature(s) Signed: 11/24/2015 4:41:03 PM By: Alric Quan Entered By: Alric Quan on 11/24/2015 14:43:33 Todd Davidson ([name] ([id number]) -------------------------------------------------------------------------------- Nutrition Risk Assessment Details Patient Name: Todd Davidson Date of Service: 11/24/2015 2:15 PM Medical Record Patient Account Number: [id number] Number: Treating RN: Ahmed Prima [birth year] (79 y.o. Other Clinician: Date of Birth/Sex: Male) Treating ROBSON, [place] Primary Care Physician: Lamonte Sakai Physician/Extender: G Referring Physician: Kasandra Knudsen Weeks in Treatment: 0 Height (in): 72 Weight (lbs): 160 Body Mass Index (BMI): 21.7 Nutrition Risk Assessment Items NUTRITION RISK SCREEN: I have an illness or condition that made me change the kind and/or 2 Yes amount of food I eat I eat fewer than two meals per day 3 Yes I eat few fruits and vegetables, or milk products 0 No I have three or more drinks of beer, liquor or wine almost every day 0 No I have tooth or mouth problems that make it hard for me to eat 0 No I don't always have enough money to buy the food I need 0 No I eat alone most of the time 1 Yes I take three or more different prescribed or over-the-counter drugs a 1 Yes day Without wanting to, I have lost or gained 10 pounds in the last six 0 No months I am not always physically able to shop, cook and/or feed myself 0 No Nutrition Protocols Good Risk Protocol Moderate Risk Protocol Provide education on High Risk Proctocol 0 Electronic Signature(s) Signed: 11/24/2015 4:41:03 PM By: Alric Quan Entered By: Alric Quan on 11/24/2015 14:23:38

## 2015-11-25 NOTE — Progress Notes (Signed)
HESHY, AFTAB ([id number]) Visit Report for 11/24/2015 Chief Complaint Document Details Patient Name: Todd Davidson, Todd Davidson. Date of Service: 11/24/2015 2:15 PM Medical Record Patient Account Number: [id number] Number: Treating RN: Ahmed Prima July 13, 1935 (80 y.o. Other Clinician: Date of Birth/Sex: Male) Treating Lincy Belles, [place] Primary Care Physician: Lamonte Sakai Physician/Extender: G Referring Physician: Kasandra Knudsen Weeks in Treatment: 0 Information Obtained from: Patient Chief Complaint 11/24/15; this is a patient to is here for a chronic wound on his right lower leg Electronic Signature(s) Signed: 11/25/2015 7:55:14 AM By: Linton Ham MD Entered By: Linton Ham on 11/24/2015 16:02:04 Todd Davidson ([id number]) -------------------------------------------------------------------------------- Debridement Details Patient Name: Todd Davidson Date of Service: 11/24/2015 2:15 PM Medical Record Patient Account Number: [id number] Number: Treating RN: Ahmed Prima November 14, 1935 (80 y.o. Other Clinician: Date of Birth/Sex: Male) Treating Stephane Junkins, [place] Primary Care Physician: Lamonte Sakai Physician/Extender: G Referring Physician: Kasandra Knudsen Weeks in Treatment: 0 Debridement Performed for Wound #1 Right,Lateral Lower Leg Assessment: Performed By: Physician Ricard Dillon, MD Debridement: Debridement Pre-procedure Yes - 14:51 Verification/Time Out Taken: Start Time: 14:51 Pain Control: Lidocaine 4% Topical Solution Level: Skin/Subcutaneous Tissue Total Area Debrided (L x 3.9 (cm) x 2.5 (cm) = 9.75 (cm) W): Tissue and other Viable, Non-Viable, Exudate, Fibrin/Slough, Subcutaneous material debrided: Instrument: Curette Bleeding: Minimum Hemostasis Achieved: Pressure End Time: 14:55 Procedural Pain: 0 Post Procedural Pain: 0 Response to Treatment: Procedure was tolerated well Post Debridement Measurements of Total Wound Length: (cm)  3.9 Width: (cm) 2.5 Depth: (cm) 0.2 Volume: (cm) 1.532 Character of Wound/Ulcer Post Stable Debridement: Severity of Tissue Post Debridement: Fat layer exposed Post Procedure Diagnosis Same as Pre-procedure Electronic Signature(s) Signed: 11/24/2015 4:41:03 PM By: Alric Quan Signed: 11/25/2015 7:55:14 AM By: Linton Ham MD Todd Davidson ([id number]) Entered By: Linton Ham on 11/24/2015 16:01:28 Todd Davidson ([id number]) -------------------------------------------------------------------------------- HPI Details Patient Name: Todd Davidson Date of Service: 11/24/2015 2:15 PM Medical Record Patient Account Number: [id number] Number: Treating RN: Ahmed Prima 02-15-1936 (80 y.o. Other Clinician: Date of Birth/Sex: Male) Treating Primus Gritton Primary Care Physician: Lamonte Sakai Physician/Extender: G Referring Physician: Kasandra Knudsen Weeks in Treatment: 0 History of Present Illness HPI Description: 11/24/15; this patient is a 80 year old man with type 2 diabetes on insulin. While mowing the lawn on August 1 the patient traumatized his right leg. He was seen in the ER and had 8 sutures. He tells me the sutures were removed all over my understanding the wound at already dehisced. He has been using peroxide and Betadine on the wound. There is a fair amount of pain but no drainage. He probably has diabetic neuropathy. He has no known history of PAD. ABIs in this clinic were 1.02 on the right and 1.11 on the left. Electronic Signature(s) Signed: 11/25/2015 7:55:14 AM By: Linton Ham MD Entered By: Linton Ham on 11/24/2015 18:19:57 Todd Davidson ([id number]) -------------------------------------------------------------------------------- Physical Exam Details Patient Name: Todd Davidson Date of Service: 11/24/2015 2:15 PM Medical Record Patient Account Number: [id number] Number: Treating RN: Ahmed Prima [birth year] (80 y.o. Other  Clinician: Date of Birth/Sex: Male) Treating Kaikoa Magro, [place] Primary Care Physician: Lamonte Sakai Physician/Extender: G Referring Physician: Kasandra Knudsen Weeks in Treatment: 0 Constitutional Sitting or standing Blood Pressure is within target range for patient.. Pulse regular and within target range for patient.Marland Kitchen Respirations regular, non-labored and within target range.. Temperature is normal and within the target range for the patient.. Patient's appearance is neat and clean. Appears in no acute distress. Well nourished and  well developed.. Eyes Conjunctivae clear. No discharge.Marland Kitchen Respiratory Respiratory effort is easy and symmetric bilaterally. Rate is normal at rest and on room air.. Bilateral breath sounds are clear and equal in all lobes with no wheezes, rales or rhonchi.. Cardiovascular Heart rhythm and rate regular, without murmur or gallop.. Femoral arteries without bruits and pulses strong.. Pedal pulses palpable and strong bilaterally.. There is no edema in the lower extremities.. Gastrointestinal (GI) Abdomen is soft and non-distended without masses or tenderness. Bowel sounds active in all quadrants.. No liver or spleen enlargement or tenderness.. Lymphatic Nonpalpable in the popliteal and inguinal area. Neurological Essentially absent vibration sense and light touch in both feet. Psychiatric No evidence of depression, anxiety, or agitation. Calm, cooperative, and communicative. Appropriate interactions and affect.. Notes Wound exam; the areas on his right anterior lateral lower leg. Some surrounding venous insufficiency but no major edema no evidence of infection the wound is covered with an adherent surface eschar, some slough and nonviable subcutaneous tissue all of which aggressively debrided. Post debridement the wound surface actually looks fairly good. Electronic Signature(s) Signed: 11/25/2015 7:55:14 AM By: Linton Ham MD Entered By: Linton Ham on  11/24/2015 18:25:57 Todd Davidson ([id number]) -------------------------------------------------------------------------------- Physician Orders Details Patient Name: Todd Davidson Date of Service: 11/24/2015 2:15 PM Medical Record Patient Account Number: [id number] Number: Treating RN: Ahmed Prima 01/12/1936 (79 y.o. Other Clinician: Date of Birth/Sex: Male) Treating Zareth Rippetoe, [place] Primary Care Physician: Lamonte Sakai Physician/Extender: G Referring Physician: Kasandra Knudsen Weeks in Treatment: 0 Verbal / Phone Orders: Yes Clinician: Pinkerton, Debi Read Back and Verified: Yes Diagnosis Coding Wound Cleansing Wound #1 Right,Lateral Lower Leg o Clean wound with Normal Saline. Anesthetic Wound #1 Right,Lateral Lower Leg o Topical Lidocaine 4% cream applied to wound bed prior to debridement Primary Wound Dressing Wound #1 Right,Lateral Lower Leg o Prisma Ag Secondary Dressing Wound #1 Right,Lateral Lower Leg o ABD pad o Dry Gauze Dressing Change Frequency Wound #1 Right,Lateral Lower Leg o Change dressing every week Follow-up Appointments Wound #1 Right,Lateral Lower Leg o Return Appointment in 1 week. Edema Control Wound #1 Right,Lateral Lower Leg o 3 Layer Compression System - Right Lower Extremity - unna to anchor o Elevate legs to the level of the heart and pump ankles as often as possible Additional Orders / Instructions Wound #1 Right,Lateral Lower Leg o Increase protein intake. Todd Davidson, Todd Davidson ([id number]) Electronic Signature(s) Signed: 11/24/2015 4:41:03 PM By: Alric Quan Signed: 11/25/2015 7:55:14 AM By: Linton Ham MD Entered By: Alric Quan on 11/24/2015 14:57:00 Todd Davidson ([id number]) -------------------------------------------------------------------------------- Problem List Details Patient Name: Todd Davidson Date of Service: 11/24/2015 2:15 PM Medical Record Patient Account Number:  [id number] Number: Treating RN: Ahmed Prima Nov 18, 1935 (79 y.o. Other Clinician: Date of Birth/Sex: Male) Treating Rosaleah Person, [place] Primary Care Physician: Lamonte Sakai Physician/Extender: G Referring Physician: Kasandra Knudsen Weeks in Treatment: 0 Active Problems ICD-10 Encounter Code Description Active Date Diagnosis I87.311 Chronic venous hypertension (idiopathic) with ulcer of 11/24/2015 Yes right lower extremity S86.821D Laceration of other muscle(s) and tendon(s) at lower leg 11/24/2015 Yes level, right leg, subsequent encounter Inactive Problems Resolved Problems Electronic Signature(s) Signed: 11/25/2015 7:55:14 AM By: Linton Ham MD Entered By: Linton Ham on 11/24/2015 18:32:49 Todd Davidson ([id number]) -------------------------------------------------------------------------------- Progress Note Details Patient Name: Todd Davidson Date of Service: 11/24/2015 2:15 PM Medical Record Patient Account Number: [id number] Number: Treating RN: Ahmed Prima 06-20-1935 (79 y.o. Other Clinician: Date of Birth/Sex: Male) Treating Arnell Slivinski, [place] Primary Care Physician: Lamonte Sakai Physician/Extender:  G Referring Physician: Kasandra Knudsen Weeks in Treatment: 0 Subjective Chief Complaint Information obtained from Patient 11/24/15; this is a patient to is here for a chronic wound on his right lower leg History of Present Illness (HPI) 11/24/15; this patient is a 80 year old man with type 2 diabetes on insulin. While mowing the lawn on August 1 the patient traumatized his right leg. He was seen in the ER and had 8 sutures. He tells me the sutures were removed all over my understanding the wound at already dehisced. He has been using peroxide and Betadine on the wound. There is a fair amount of pain but no drainage. He probably has diabetic neuropathy. He has no known history of PAD. ABIs in this clinic were 1.02 on the right and 1.11 on the  left. Wound History Patient presents with 1 open wound that has been present for approximately 10/20/15. Patient has been treating wound in the following manner: peroxide and betadine. Laboratory tests have not been performed in the last month. Patient reportedly has not tested positive for an antibiotic resistant organism. Patient reportedly has not tested positive for osteomyelitis. Patient reportedly has not had testing performed to evaluate circulation in the legs. Patient experiences the following problems associated with their wounds: swelling. Patient History Information obtained from Patient. Allergies Sulfa Family History Cancer - Siblings, Heart Disease - Siblings, Maternal Grandparents, No family history of Diabetes, Hereditary Spherocytosis, Hypertension, Kidney Disease, Lung Disease, Seizures, Stroke, Thyroid Problems, Tuberculosis. Social History Former smoker - quit 1975, Marital Status - Widowed, Alcohol Use - Never, Drug Use - No History, Caffeine Use - Never. Todd Davidson, Todd Davidson ([id number]) Medical History Eyes Patient has history of Cataracts - removed Respiratory Patient has history of Asthma, Sleep Apnea Cardiovascular Patient has history of Coronary Artery Disease, Hypertension Endocrine Patient has history of Type II Diabetes Musculoskeletal Patient has history of Osteoarthritis Neurologic Patient has history of Neuropathy Patient is treated with Insulin. Blood sugar is tested. Review of Systems (ROS) Constitutional Symptoms (General Health) The patient has no complaints or symptoms. Eyes Complains or has symptoms of Glasses / Contacts, has one eye Ear/Nose/Mouth/Throat has dentures Hematologic/Lymphatic The patient has no complaints or symptoms. Cardiovascular hypercholesterol Gastrointestinal GERD Barrett esophagus Genitourinary BPH renal insufficiency kidney stones Immunological The patient has no complaints or symptoms. Integumentary  (Skin) Complains or has symptoms of Wounds. Neurologic occasional tremors Oncologic The patient has no complaints or symptoms. Psychiatric Complains or has symptoms of Anxiety, depression Objective Constitutional Todd Davidson, Todd G. ([id number]) Sitting or standing Blood Pressure is within target range for patient.. Pulse regular and within target range for patient.Marland Kitchen Respirations regular, non-labored and within target range.. Temperature is normal and within the target range for the patient.. Patient's appearance is neat and clean. Appears in no acute distress. Well nourished and well developed.. Vitals Time Taken: 2:07 PM, Height: 72 in, Source: Stated, Weight: 160 lbs, Source: Stated, BMI: 21.7, Temperature: 98.0 F, Pulse: 56 bpm, Respiratory Rate: 18 breaths/min, Blood Pressure: 117/49 mmHg. Eyes Conjunctivae clear. No discharge.Marland Kitchen Respiratory Respiratory effort is easy and symmetric bilaterally. Rate is normal at rest and on room air.. Bilateral breath sounds are clear and equal in all lobes with no wheezes, rales or rhonchi.. Cardiovascular Heart rhythm and rate regular, without murmur or gallop.. Femoral arteries without bruits and pulses strong.. Pedal pulses palpable and strong bilaterally.. There is no edema in the lower extremities.. Gastrointestinal (GI) Abdomen is soft and non-distended without masses or tenderness. Bowel sounds active in all quadrants.. No liver  or spleen enlargement or tenderness.. Lymphatic Nonpalpable in the popliteal and inguinal area. Neurological Essentially absent vibration sense and light touch in both feet. Psychiatric No evidence of depression, anxiety, or agitation. Calm, cooperative, and communicative. Appropriate interactions and affect.. General Notes: Wound exam; the areas on his right anterior lateral lower leg. Some surrounding venous insufficiency but no major edema no evidence of infection the wound is covered with an adherent  surface eschar, some slough and nonviable subcutaneous tissue all of which aggressively debrided. Post debridement the wound surface actually looks fairly good. Integumentary (Hair, Skin) Wound #1 status is Open. Original cause of wound was Trauma. The wound is located on the Right,Lateral Lower Leg. The wound measures 3.9cm length x 2.5cm width x 0.1cm depth; 7.658cm^2 area and 0.766cm^3 volume. The wound is limited to skin breakdown. There is no tunneling or undermining noted. There is a large amount of serosanguineous drainage noted. The wound margin is distinct with the outline attached to the wound base. There is no granulation within the wound bed. There is a large (67-100%) amount of necrotic tissue within the wound bed including Eschar and Adherent Slough. The periwound skin appearance exhibited: Moist, Erythema. The surrounding wound skin color is noted with erythema which is circumferential. Periwound temperature was noted as No Abnormality. The periwound has tenderness on palpation. Todd Davidson, Todd Davidson ([id number]) Assessment Active Problems ICD-10 I87.311 - Chronic venous hypertension (idiopathic) with ulcer of right lower extremity S86.222S - Laceration of muscle(s) and tendon(s) of anterior muscle group at lower leg level, left leg, sequela Procedures Wound #1 Wound #1 is a Diabetic Wound/Ulcer of the Lower Extremity located on the Right,Lateral Lower Leg . There was a Skin/Subcutaneous Tissue Debridement [id number]) debridement with total area of 9.75 sq cm performed by Ricard Dillon, MD. with the following instrument(s): Curette to remove Viable and Non-Viable tissue/material including Exudate, Fibrin/Slough, and Subcutaneous after achieving pain control using Lidocaine 4% Topical Solution. A time out was conducted at 14:51, prior to the start of the procedure. A Minimum amount of bleeding was controlled with Pressure. The procedure was tolerated well with a pain level of  0 throughout and a pain level of 0 following the procedure. Post Debridement Measurements: 3.9cm length x 2.5cm width x 0.2cm depth; 1.532cm^3 volume. Character of Wound/Ulcer Post Debridement is stable. Severity of Tissue Post Debridement is: Fat layer exposed. Post procedure Diagnosis Wound #1: Same as Pre-Procedure Plan Wound Cleansing: Wound #1 Right,Lateral Lower Leg: Clean wound with Normal Saline. Anesthetic: Wound #1 Right,Lateral Lower Leg: Topical Lidocaine 4% cream applied to wound bed prior to debridement Primary Wound Dressing: Wound #1 Right,Lateral Lower Leg: Prisma Ag Secondary Dressing: Wound #1 Right,Lateral Lower Leg: Todd Davidson, Todd G. ([id number]) ABD pad Dry Gauze Dressing Change Frequency: Wound #1 Right,Lateral Lower Leg: Change dressing every week Follow-up Appointments: Wound #1 Right,Lateral Lower Leg: Return Appointment in 1 week. Edema Control: Wound #1 Right,Lateral Lower Leg: 3 Layer Compression System - Right Lower Extremity - unna to anchor Elevate legs to the level of the heart and pump ankles as often as possible Additional Orders / Instructions: Wound #1 Right,Lateral Lower Leg: Increase protein intake. 1. I elected to proceed directly to a collagen-based dressing with Prisma rather than giving him Santyl. I put his leg and a Profore light wrap. There is no evidence here of PAD of significance. No evidence of infection. The base of the wound post debridement actually look quite stable making me think to proceed directly with a primary dressing other  than Santyl. Clearly next week if the wound bed requires further debridement that decision would not be correct and Santyl will be necessary at that time Electronic Signature(s) Signed: 11/25/2015 7:55:14 AM By: Linton Ham MD Entered By: Linton Ham on 11/24/2015 18:28:20 Todd Davidson ([id number]) -------------------------------------------------------------------------------- ROS/PFSH  Details Patient Name: Todd Davidson Date of Service: 11/24/2015 2:15 PM Medical Record Patient Account Number: [id number] Number: Treating RN: Ahmed Prima [birth year] (79 y.o. Other Clinician: Date of Birth/Sex: Male) Treating Shalom Mcguiness, [place] Primary Care Physician: Lamonte Sakai Physician/Extender: G Referring Physician: Kasandra Knudsen Weeks in Treatment: 0 Information Obtained From Patient Wound History Do you currently have one or more open woundso Yes How many open wounds do you currently haveo 1 Approximately how long have you had your woundso 10/20/15 How have you been treating your wound(s) until nowo peroxide and betadine Has your wound(s) ever healed and then re-openedo No Have you had any lab work done in the past montho No Have you tested positive for an antibiotic resistant organism (MRSA, VRE)o No Have you tested positive for osteomyelitis (bone infection)o No Have you had any tests for circulation on your legso No Have you had other problems associated with your woundso Swelling Eyes Complaints and Symptoms: Positive for: Glasses / Contacts Review of System Notes: has one eye Medical History: Positive for: Cataracts - removed Integumentary (Skin) Complaints and Symptoms: Positive for: Wounds Psychiatric Complaints and Symptoms: Positive for: Anxiety Review of System Notes: depression Constitutional Symptoms (General Health) Todd Davidson, Todd Davidson ([id number]) Complaints and Symptoms: No Complaints or Symptoms Ear/Nose/Mouth/Throat Complaints and Symptoms: Review of System Notes: has dentures Hematologic/Lymphatic Complaints and Symptoms: No Complaints or Symptoms Respiratory Medical History: Positive for: Asthma; Sleep Apnea Cardiovascular Complaints and Symptoms: Review of System Notes: hypercholesterol Medical History: Positive for: Coronary Artery Disease; Hypertension Gastrointestinal Complaints and Symptoms: Review of System  Notes: GERD Barrett esophagus Endocrine Medical History: Positive for: Type II Diabetes Time with diabetes: 15 years Treated with: Insulin Blood sugar tested every day: Yes Tested : 3x a day Genitourinary Complaints and Symptoms: Review of System Notes: BPH renal insufficiency kidney stones [place], Honorio G. ([id number]) Immunological Complaints and Symptoms: No Complaints or Symptoms Musculoskeletal Medical History: Positive for: Osteoarthritis Neurologic Complaints and Symptoms: Review of System Notes: occasional tremors Medical History: Positive for: Neuropathy Oncologic Complaints and Symptoms: No Complaints or Symptoms HBO Extended History Items Eyes: Cataracts Family and Social History Cancer: Yes - Siblings; Diabetes: No; Heart Disease: Yes - Siblings, Maternal Grandparents; Hereditary Spherocytosis: No; Hypertension: No; Kidney Disease: No; Lung Disease: No; Seizures: No; Stroke: No; Thyroid Problems: No; Tuberculosis: No; Former smoker - quit 1975; Marital Status - Widowed; Alcohol Use: Never; Drug Use: No History; Caffeine Use: Never; Financial Concerns: No; Food, Clothing or Shelter Needs: No; Support System Lacking: No; Transportation Concerns: No; Advanced Directives: No; Patient does not want information on Advanced Directives; Do not resuscitate: No; Living Will: No; Medical Power of Attorney: No Electronic Signature(s) Signed: 11/24/2015 4:41:03 PM By: Alric Quan Signed: 11/25/2015 7:55:14 AM By: Linton Ham MD Entered By: Alric Quan on 11/24/2015 14:20:33 Todd Davidson ([id number]) -------------------------------------------------------------------------------- SuperBill Details Patient Name: Todd Davidson Date of Service: 11/24/2015 Medical Record Patient Account Number: [id number] Number: Treating RN: Ahmed Prima 1935/05/13 (79 y.o. Other Clinician: Date of Birth/Sex: Male) Treating Jarvis Knodel, [place] Primary Care  Physician: Lamonte Sakai Physician/Extender: G Referring Physician: Kasandra Knudsen Weeks in Treatment: 0 Diagnosis Coding ICD-10 Codes Code Description I87.311 Chronic venous hypertension (idiopathic) with ulcer of right  lower extremity Laceration of muscle(s) and tendon(s) of anterior muscle group at lower leg level, left leg, S86.222S sequela Facility Procedures CPT4 Code Description: [id number] - WOUND CARE VISIT-LEV 3 EST PT Modifier: Quantity: 1 CPT4 Code Description: [id number] - DEB SUBQ TISSUE 20 SQ CM/< ICD-10 Description Diagnosis I87.311 Chronic venous hypertension (idiopathic) with ulcer of Modifier: right lower Quantity: 1 extremity Physician Procedures CPT4 Code Description: [id number] WC PHYS LEVEL 3 o NEW PT ICD-10 Description Diagnosis I87.311 Chronic venous hypertension (idiopathic) with ulcer o Modifier: f right lower Quantity: 1 extremity CPT4 Code Description: [id number] 11042 - WC PHYS SUBQ TISS 20 SQ CM ICD-10 Description Diagnosis I87.311 Chronic venous hypertension (idiopathic) with ulcer o Modifier: f right lower Quantity: 1 extremity Electronic Signature(s) Signed: 11/25/2015 7:55:14 AM By: Linton Ham MD Entered By: Linton Ham on 11/24/2015 18:33:08

## 2015-11-25 NOTE — Progress Notes (Signed)
Todd Davidson, Todd Davidson ([id number]) Visit Report for 11/24/2015 Allergy List Details Patient Name: Todd Davidson. Date of Service: 11/24/2015 2:15 PM Medical Record Number: [id number] Patient Account Number: [id number] Date of Birth/Sex: [birth year] (80 y.o. Male) Treating RN: Ahmed Prima Primary Care Physician: Lamonte Sakai Other Clinician: Referring Physician: Kasandra Knudsen Treating Physician/Extender: Ricard Dillon Weeks in Treatment: 0 Allergies Active Allergies Sulfa Allergy Notes Electronic Signature(s) Signed: 11/24/2015 4:41:03 PM By: Alric Quan Entered By: Alric Quan on 11/24/2015 14:09:58 Todd Davidson ([id number]) -------------------------------------------------------------------------------- Arrival Information Details Patient Name: Todd Davidson Date of Service: 11/24/2015 2:15 PM Medical Record Number: [id number] Patient Account Number: [id number] Date of Birth/Sex: [birth year] (80 y.o. Male) Treating RN: Ahmed Prima Primary Care Physician: Lamonte Sakai Other Clinician: Referring Physician: Kasandra Knudsen Treating Physician/Extender: Tito Dine in Treatment: 0 Visit Information Patient Arrived: Cane Arrival Time: 14:02 Accompanied By: self Transfer Assistance: None Patient Identification Verified: Yes Secondary Verification Process Completed: Yes Patient Requires Transmission-Based No Precautions: Patient Has Alerts: Yes Patient Alerts: DM II Electronic Signature(s) Signed: 11/24/2015 4:41:03 PM By: Alric Quan Entered By: Alric Quan on 11/24/2015 14:05:02 Todd Davidson ([id number]) -------------------------------------------------------------------------------- Clinic Level of Care Assessment Details Patient Name: Todd Davidson Date of Service: 11/24/2015 2:15 PM Medical Record Number: [id number] Patient Account Number: [id number] Date of Birth/Sex: Mar 04, 1936 (80 y.o. Male) Treating RN: Ahmed Prima Primary Care  Physician: Lamonte Sakai Other Clinician: Referring Physician: Kasandra Knudsen Treating Physician/Extender: Ricard Dillon Weeks in Treatment: 0 Clinic Level of Care Assessment Items TOOL 1 Quantity Score X - Use when EandM and Procedure is performed on INITIAL visit 1 0 ASSESSMENTS - Nursing Assessment / Reassessment X - General Physical Exam (combine w/ comprehensive assessment (listed just 1 20 below) when performed on new pt. evals) X - Comprehensive Assessment (HX, ROS, Risk Assessments, Wounds Hx, etc.) 1 25 ASSESSMENTS - Wound and Skin Assessment / Reassessment '[]'  - Dermatologic / Skin Assessment (not related to wound area) 0 ASSESSMENTS - Ostomy and/or Continence Assessment and Care '[]'  - Incontinence Assessment and Management 0 '[]'  - Ostomy Care Assessment and Management (repouching, etc.) 0 PROCESS - Coordination of Care '[]'  - Simple Patient / Family Education for ongoing care 0 X - Complex (extensive) Patient / Family Education for ongoing care 1 20 X - Staff obtains Programmer, systems, Records, Test Results / Process Orders 1 10 '[]'  - Staff telephones HHA, Nursing Homes / Clarify orders / etc 0 '[]'  - Routine Transfer to another Facility (non-emergent condition) 0 '[]'  - Routine Hospital Admission (non-emergent condition) 0 X - New Admissions / Biomedical engineer / Ordering NPWT, Apligraf, etc. 1 15 '[]'  - Emergency Hospital Admission (emergent condition) 0 PROCESS - Special Needs '[]'  - Pediatric / Minor Patient Management 0 '[]'  - Isolation Patient Management 0 Davidson, Todd G. ([id number]) '[]'  - Hearing / Language / Visual special needs 0 '[]'  - Assessment of Community assistance (transportation, D/C planning, etc.) 0 '[]'  - Additional assistance / Altered mentation 0 '[]'  - Support Surface(s) Assessment (bed, cushion, seat, etc.) 0 INTERVENTIONS - Miscellaneous '[]'  - External ear exam 0 '[]'  - Patient Transfer (multiple staff / Civil Service fast streamer / Similar devices) 0 '[]'  - Simple Staple / Suture  removal (25 or less) 0 '[]'  - Complex Staple / Suture removal (26 or more) 0 '[]'  - Hypo/Hyperglycemic Management (do not check if billed separately) 0 X - Ankle / Brachial Index (ABI) - do not check if billed separately 1 15 Has the patient been seen at the hospital within  the last three years: Yes Total Score: 105 Level Of Care: New/Established - Level 3 Electronic Signature(s) Signed: 11/24/2015 4:41:03 PM By: Alric Quan Entered By: Alric Quan on 11/24/2015 16:11:53 Todd Davidson ([id number]) -------------------------------------------------------------------------------- Encounter Discharge Information Details Patient Name: Todd Davidson Date of Service: 11/24/2015 2:15 PM Medical Record Number: [id number] Patient Account Number: [id number] Date of Birth/Sex: 17-Jul-1935 (80 y.o. Male) Treating RN: Ahmed Prima Primary Care Physician: Lamonte Sakai Other Clinician: Referring Physician: Kasandra Knudsen Treating Physician/Extender: Tito Dine in Treatment: 0 Encounter Discharge Information Items Discharge Pain Level: 0 Discharge Condition: Stable Ambulatory Status: Cane Discharge Destination: Home Transportation: Private Auto Accompanied By: self Schedule Follow-up Appointment: Yes Medication Reconciliation completed No and provided to Patient/Care Nhyla Nappi: Provided on Clinical Summary of Care: 11/24/2015 Form Type Recipient Paper Patient LI Electronic Signature(s) Signed: 11/24/2015 3:17:59 PM By: Ruthine Dose Entered By: Ruthine Dose on 11/24/2015 15:17:59 Todd Davidson ([id number]) -------------------------------------------------------------------------------- Lower Extremity Assessment Details Patient Name: Todd Davidson Date of Service: 11/24/2015 2:15 PM Medical Record Number: [id number] Patient Account Number: [id number] Date of Birth/Sex: [birth year] (80 y.o. Male) Treating RN: Ahmed Prima Primary Care Physician: Lamonte Sakai Other  Clinician: Referring Physician: Kasandra Knudsen Treating Physician/Extender: Ricard Dillon Weeks in Treatment: 0 Edema Assessment Assessed: [Left: No] [Right: No] E[Left: dema] [Right: :] Calf Left: Right: Point of Measurement: 32 cm From Medial Instep 29.2 cm 29.5 cm Ankle Left: Right: Point of Measurement: 10 cm From Medial Instep 21 cm 21.4 cm Vascular Assessment Pulses: Posterior Tibial Dorsalis Pedis Palpable: [Left:Yes] [Right:Yes] Doppler: [Left:Monophasic] [Right:Monophasic] Extremity colors, hair growth, and conditions: Extremity Color: [Left:Hyperpigmented] [Right:Hyperpigmented] Temperature of Extremity: [Left:Warm] [Right:Warm] Capillary Refill: [Left:< 3 seconds] [Right:< 3 seconds] Blood Pressure: Brachial: [Left:120] [Right:110] Dorsalis Pedis: 132 [Left:Dorsalis Pedis: 100] Ankle: Posterior Tibial: 130 [Left:Posterior Tibial: 122 1.10] [Right:1.02] Toe Nail Assessment Left: Right: Thick: Yes Yes Discolored: Yes Yes Deformed: No No Improper Length and Hygiene: Yes Yes Electronic Signature(s) Signed: 11/24/2015 4:41:03 PM By: Marilu Favre ([id number]) Entered By: Alric Quan on 11/24/2015 14:40:32 Todd Davidson ([id number]) -------------------------------------------------------------------------------- Multi Wound Chart Details Patient Name: Todd Davidson Date of Service: 11/24/2015 2:15 PM Medical Record Number: [id number] Patient Account Number: [id number] Date of Birth/Sex: 1935-04-06 (80 y.o. Male) Treating RN: Ahmed Prima Primary Care Physician: Lamonte Sakai Other Clinician: Referring Physician: Kasandra Knudsen Treating Physician/Extender: Ricard Dillon Weeks in Treatment: 0 Vital Signs Height(in): 72 Pulse(bpm): 56 Weight(lbs): 160 Blood Pressure 117/49 (mmHg): Body Mass Index(BMI): 22 Temperature(F): 98.0 Respiratory Rate 18 (breaths/min): Photos: [1:No Photos] [N/A:N/A] Wound Location: [1:Right  Lower Leg - Lateral N/A] Wounding Event: [1:Trauma] [N/A:N/A] Primary Etiology: [1:Diabetic Wound/Ulcer of N/A the Lower Extremity] Secondary Etiology: [1:Trauma, Other] [N/A:N/A] Comorbid History: [1:Cataracts, Asthma, Sleep N/A Apnea, Coronary Artery Disease, Hypertension, Type II Diabetes, Osteoarthritis, Neuropathy] Date Acquired: [1:10/20/2015] [N/A:N/A] Weeks of Treatment: [1:0] [N/A:N/A] Wound Status: [1:Open] [N/A:N/A] Measurements L x W x D 3.9x2.5x0.1 [N/A:N/A] (cm) Area (cm) : [1:7.658] [N/A:N/A] Volume (cm) : [1:0.766] [N/A:N/A] Classification: [1:Grade 1] [N/A:N/A] Exudate Amount: [1:Large] [N/A:N/A] Exudate Type: [1:Serosanguineous] [N/A:N/A] Exudate Color: [1:red, brown] [N/A:N/A] Wound Margin: [1:Distinct, outline attached N/A] Granulation Amount: [1:None Present (0%)] [N/A:N/A] Necrotic Amount: [1:Large (67-100%)] [N/A:N/A] Necrotic Tissue: [1:Eschar, Adherent Slough N/A] Exposed Structures: [1:Fascia: No Fat: No Tendon: No Muscle: No] [N/A:N/A] Joint: No Bone: No Limited to Skin Breakdown Epithelialization: None N/A N/A Periwound Skin Texture: No Abnormalities Noted N/A N/A Periwound Skin Moist: Yes N/A N/A Moisture: Periwound Skin Color: Erythema: Yes N/A N/A Erythema Location: Circumferential  N/A N/A Temperature: No Abnormality N/A N/A Tenderness on Yes N/A N/A Palpation: Wound Preparation: Ulcer Cleansing: N/A N/A Rinsed/Irrigated with Saline Topical Anesthetic Applied: Other: lidocaine 4% Treatment Notes Electronic Signature(s) Signed: 11/24/2015 4:41:03 PM By: Alric Quan Entered By: Alric Quan on 11/24/2015 14:47:43 Todd Davidson ([id number]) -------------------------------------------------------------------------------- Multi-Disciplinary Care Plan Details Patient Name: Todd Davidson Date of Service: 11/24/2015 2:15 PM Medical Record Number: [id number] Patient Account Number: [id number] Date of Birth/Sex: 06-Aug-1935 (80 y.o.  Male) Treating RN: Ahmed Prima Primary Care Physician: Lamonte Sakai Other Clinician: Referring Physician: Kasandra Knudsen Treating Physician/Extender: Tito Dine in Treatment: 0 Active Inactive Abuse / Safety / Falls / Self Care Management Nursing Diagnoses: Potential for falls Goals: Patient will remain injury free Date Initiated: 11/24/2015 Goal Status: Active Interventions: Assess fall risk on admission and as needed Notes: Nutrition Nursing Diagnoses: Imbalanced nutrition Impaired glucose control: actual or potential Potential for alteratiion in Nutrition/Potential for imbalanced nutrition Goals: Patient/caregiver agrees to and verbalizes understanding of need to use nutritional supplements and/or vitamins as prescribed Date Initiated: 11/24/2015 Goal Status: Active Patient/caregiver verbalizes understanding of need to maintain therapeutic glucose control per primary care physician Date Initiated: 11/24/2015 Goal Status: Active Patient/caregiver will maintain therapeutic glucose control Date Initiated: 11/24/2015 Goal Status: Active Interventions: Assess patient nutrition upon admission and as needed per policy ROLEN, CONGER ([id number]) Provide education on elevated blood sugars and impact on wound healing Notes: Orientation to the Wound Care Program Nursing Diagnoses: Knowledge deficit related to the wound healing center program Goals: Patient/caregiver will verbalize understanding of the [facility] Date Initiated: 11/24/2015 Goal Status: Active Interventions: Provide education on orientation to the wound center Notes: Pain, Acute or Chronic Nursing Diagnoses: Pain, acute or chronic: actual or potential Potential alteration in comfort, pain Goals: Patient will verbalize adequate pain control and receive pain control interventions during procedures as needed Date Initiated: 11/24/2015 Goal Status: Active Patient/caregiver will  verbalize adequate pain control between visits Date Initiated: 11/24/2015 Goal Status: Active Patient/caregiver will verbalize comfort level met Date Initiated: 11/24/2015 Goal Status: Active Interventions: Assess comfort goal upon admission Complete pain assessment as per visit requirements Notes: Wound/Skin Impairment Nursing Diagnoses: Impaired tissue integrity Coomes, Zhion Darnell Level ([id number]) Goals: Ulcer/skin breakdown will have a volume reduction of 30% by week 4 Date Initiated: 11/24/2015 Goal Status: Active Ulcer/skin breakdown will have a volume reduction of 50% by week 8 Date Initiated: 11/24/2015 Goal Status: Active Ulcer/skin breakdown will have a volume reduction of 80% by week 12 Date Initiated: 11/24/2015 Goal Status: Active Interventions: Assess patient/caregiver ability to perform ulcer/skin care regimen upon admission and as needed Notes: Electronic Signature(s) Signed: 11/24/2015 4:41:03 PM By: Alric Quan Entered By: Alric Quan on 11/24/2015 14:47:24 Todd Davidson ([id number]) -------------------------------------------------------------------------------- Pain Assessment Details Patient Name: Todd Davidson Date of Service: 11/24/2015 2:15 PM Medical Record Number: [id number] Patient Account Number: [id number] Date of Birth/Sex: 1936/03/20 (80 y.o. Male) Treating RN: Ahmed Prima Primary Care Physician: Lamonte Sakai Other Clinician: Referring Physician: Kasandra Knudsen Treating Physician/Extender: Ricard Dillon Weeks in Treatment: 0 Active Problems Location of Pain Severity and Description of Pain Patient Has Paino Yes Site Locations Pain Location: Pain in Ulcers With Dressing Change: Yes Duration of the Pain. Constant / Intermittento Constant Rate the pain. Current Pain Level: 4 Worst Pain Level: 8 Least Pain Level: 2 Character of Pain Describe the Pain: Sharp, Shooting Pain Management and Medication Current Pain Management: Electronic  Signature(s) Signed: 11/24/2015 4:41:03 PM By: Alric Quan Entered By:  Alric Quan on 11/24/2015 14:06:08 TRITON, HEIDRICH ([id number]) -------------------------------------------------------------------------------- Patient/Caregiver Education Details Patient Name: Todd Davidson Date of Service: 11/24/2015 2:15 PM Medical Record Number: [id number] Patient Account Number: [id number] Date of Birth/Gender: Aug 10, 1935 (80 y.o. Male) Treating RN: Ahmed Prima Primary Care Physician: Lamonte Sakai Other Clinician: Referring Physician: Kasandra Knudsen Treating Physician/Extender: Tito Dine in Treatment: 0 Education Assessment Education Provided To: Patient Education Topics Provided Elevated Blood Sugar/ Impact on Healing: Handouts: Elevated Blood Sugars: How Do They Affect Wound Healing Methods: Explain/Verbal Welcome To [facility]: Handouts: Welcome To [facility] Methods: Explain/Verbal Wound/Skin Impairment: Handouts: Other: do not get wrap wet Methods: Demonstration, Explain/Verbal Responses: State content correctly Electronic Signature(s) Signed: 11/24/2015 4:41:03 PM By: Alric Quan Entered By: Alric Quan on 11/24/2015 14:58:25 Todd Davidson ([id number]) -------------------------------------------------------------------------------- Wound Assessment Details Patient Name: Todd Davidson Date of Service: 11/24/2015 2:15 PM Medical Record Number: [id number] Patient Account Number: [id number] Date of Birth/Sex: 14-Nov-1935 (80 y.o. Male) Treating RN: Ahmed Prima Primary Care Physician: Lamonte Sakai Other Clinician: Referring Physician: Kasandra Knudsen Treating Physician/Extender: Ricard Dillon Weeks in Treatment: 0 Wound Status Wound Number: 1 Primary Diabetic Wound/Ulcer of the Lower Etiology: Extremity Wound Location: Right Lower Leg - Lateral Secondary Trauma, Other Wounding Event: Trauma Etiology: Date  Acquired: 10/20/2015 Wound Open Weeks Of Treatment: 0 Status: Clustered Wound: No Comorbid Cataracts, Asthma, Sleep Apnea, History: Coronary Artery Disease, Hypertension, Type II Diabetes, Osteoarthritis, Neuropathy Photos Photo Uploaded By: Alric Quan on 11/24/2015 16:16:46 Wound Measurements Length: (cm) 3.9 Width: (cm) 2.5 Depth: (cm) 0.1 Area: (cm) 7.658 Volume: (cm) 0.766 % Reduction in Area: % Reduction in Volume: Epithelialization: None Tunneling: No Undermining: No Wound Description Classification: Grade 1 Wound Margin: Distinct, outline attached Exudate Amount: Large Exudate Type: Serosanguineous Exudate Color: red, brown Foul Odor After Cleansing: No Wound Bed Granulation Amount: None Present (0%) Exposed Structure Yniguez, Manjot G. ([id number]) Necrotic Amount: Large (67-100%) Fascia Exposed: No Necrotic Quality: Eschar, Adherent Slough Fat Layer Exposed: No Tendon Exposed: No Muscle Exposed: No Joint Exposed: No Bone Exposed: No Limited to Skin Breakdown Periwound Skin Texture Texture Color No Abnormalities Noted: No No Abnormalities Noted: No Erythema: Yes Moisture Erythema Location: Circumferential No Abnormalities Noted: No Moist: Yes Temperature / Pain Temperature: No Abnormality Tenderness on Palpation: Yes Wound Preparation Ulcer Cleansing: Rinsed/Irrigated with Saline Topical Anesthetic Applied: Other: lidocaine 4%, Treatment Notes Wound #1 (Right, Lateral Lower Leg) 1. Cleansed with: Clean wound with Normal Saline 2. Anesthetic Topical Lidocaine 4% cream to wound bed prior to debridement 4. Dressing Applied: Prisma Ag 5. Secondary Dressing Applied ABD Pad Dry Gauze 7. Secured with Tape 3 Layer Compression System - Right Lower Extremity Notes unna to anchor Electronic Signature(s) Signed: 11/24/2015 4:41:03 PM By: Alric Quan Entered By: Alric Quan on 11/24/2015 14:43:00 Todd Davidson  ([id number]) -------------------------------------------------------------------------------- Vitals Details Patient Name: Todd Davidson Date of Service: 11/24/2015 2:15 PM Medical Record Number: [id number] Patient Account Number: [id number] Date of Birth/Sex: [birth year] (80 y.o. Male) Treating RN: Ahmed Prima Primary Care Physician: Lamonte Sakai Other Clinician: Referring Physician: Kasandra Knudsen Treating Physician/Extender: Ricard Dillon Weeks in Treatment: 0 Vital Signs Time Taken: 14:07 Temperature (F): 98.0 Height (in): 72 Pulse (bpm): 56 Source: Stated Respiratory Rate (breaths/min): 18 Weight (lbs): 160 Blood Pressure (mmHg): 117/49 Source: Stated Reference Range: 80 - 120 mg / dl Body Mass Index (BMI): 21.7 Electronic Signature(s) Signed: 11/24/2015 4:41:03 PM By: Alric Quan Entered By: Alric Quan on 11/24/2015 14:08:32

## 2015-12-01 ENCOUNTER — Encounter: Payer: Medicare Other | Admitting: Internal Medicine

## 2015-12-01 DIAGNOSIS — I87311 Chronic venous hypertension (idiopathic) with ulcer of right lower extremity: Secondary | ICD-10-CM | POA: Diagnosis not present

## 2015-12-01 NOTE — Progress Notes (Signed)
JASMEET, GEHL ([id number]) Visit Report for 12/01/2015 Arrival Information Details Patient Name: LAWYER, WASHABAUGH. Date of Service: 12/01/2015 3:30 PM Medical Record Number: [id number] Patient Account Number: [id number] Date of Birth/Sex: 04/18/1935 (80 y.o. Male) Treating RN: Montey Hora Primary Care Physician: Lamonte Sakai Other Clinician: Referring Physician: Lamonte Sakai Treating Physician/Extender: Tito Dine in Treatment: 1 Visit Information History Since Last Visit Added or deleted any medications: No Patient Arrived: Kasandra Knudsen Any new allergies or adverse reactions: No Arrival Time: 15:35 Had a fall or experienced change in No Accompanied By: self activities of daily living that may affect Transfer Assistance: None risk of falls: Patient Identification Verified: Yes Signs or symptoms of abuse/neglect since last No Secondary Verification Process Completed: Yes visito Patient Requires Transmission-Based No Hospitalized since last visit: No Precautions: Pain Present Now: No Patient Has Alerts: Yes Patient Alerts: DM II Electronic Signature(s) Signed: 12/01/2015 5:07:29 PM By: Montey Hora Entered By: Montey Hora on 12/01/2015 15:36:12 [name] ([id number]) -------------------------------------------------------------------------------- Encounter Discharge Information Details Patient Name: [name] Date of Service: 12/01/2015 3:30 PM Medical Record Number: [id number] Patient Account Number: [id number] Date of Birth/Sex: 15-Mar-1936 (80 y.o. Male) Treating RN: Montey Hora Primary Care Physician: Lamonte Sakai Other Clinician: Referring Physician: Lamonte Sakai Treating Physician/Extender: Tito Dine in Treatment: 1 Encounter Discharge Information Items Discharge Pain Level: 0 Discharge Condition: Stable Ambulatory Status: Cane Discharge Destination: Home Transportation: Private Auto Accompanied By: self Schedule Follow-up  Appointment: Yes Medication Reconciliation completed No and provided to Patient/Care Kadeen Sroka: Provided on Clinical Summary of Care: 12/01/2015 Form Type Recipient Paper Patient LI Electronic Signature(s) Signed: 12/01/2015 4:40:26 PM By: Montey Hora Previous Signature: 12/01/2015 4:10:31 PM Version By: Ruthine Dose Entered By: Montey Hora on 12/01/2015 16:40:26 [name] ([id number]) -------------------------------------------------------------------------------- Lower Extremity Assessment Details Patient Name: [name] Date of Service: 12/01/2015 3:30 PM Medical Record Number: [id number] Patient Account Number: [id number] Date of Birth/Sex: 10/26/1935 (80 y.o. Male) Treating RN: Montey Hora Primary Care Physician: Lamonte Sakai Other Clinician: Referring Physician: Lamonte Sakai Treating Physician/Extender: Ricard Dillon Weeks in Treatment: 1 Edema Assessment Assessed: [Left: No] [Right: No] Edema: [Left: N] [Right: o] Calf Left: Right: Point of Measurement: 32 cm From Medial Instep cm 28.8 cm Ankle Left: Right: Point of Measurement: 10 cm From Medial Instep cm 21 cm Vascular Assessment Pulses: Posterior Tibial Dorsalis Pedis Palpable: [Right:Yes] Extremity colors, hair growth, and conditions: Extremity Color: [Right:Normal] Hair Growth on Extremity: [Right:Yes] Temperature of Extremity: [Right:Warm] Capillary Refill: [Right:< 3 seconds] Electronic Signature(s) Signed: 12/01/2015 5:07:29 PM By: Montey Hora Entered By: Montey Hora on 12/01/2015 15:42:59 [name] ([id number]) -------------------------------------------------------------------------------- Multi Wound Chart Details Patient Name: [name] Date of Service: 12/01/2015 3:30 PM Medical Record Number: [id number] Patient Account Number: [id number] Date of Birth/Sex: 01/18/1936 (80 y.o. Male) Treating RN: Montey Hora Primary Care Physician: Lamonte Sakai Other  Clinician: Referring Physician: Lamonte Sakai Treating Physician/Extender: Ricard Dillon Weeks in Treatment: 1 Vital Signs Height(in): 72 Pulse(bpm): 62 Weight(lbs): 160 Blood Pressure 133/59 (mmHg): Body Mass Index(BMI): 22 Temperature(F): 98.2 Respiratory Rate 18 (breaths/min): Photos: [N/A:N/A] Wound Location: Right Lower Leg - Lateral N/A N/A Wounding Event: Trauma N/A N/A Primary Etiology: Diabetic Wound/Ulcer of N/A N/A the Lower Extremity Secondary Etiology: Trauma, Other N/A N/A Comorbid History: Cataracts, Asthma, Sleep N/A N/A Apnea, Coronary Artery Disease, Hypertension, Type II Diabetes, Osteoarthritis, Neuropathy Date Acquired: 10/20/2015 N/A N/A Weeks of Treatment: 1 N/A N/A Wound Status: Open N/A N/A Measurements L x W x D  3.7x1.7x0.2 N/A N/A (cm) Area (cm) : 4.94 N/A N/A Volume (cm) : 0.988 N/A N/A % Reduction in Area: 35.50% N/A N/A % Reduction in Volume: -29.00% N/A N/A Classification: Grade 1 N/A N/A Exudate Amount: Large N/A N/A Exudate Type: Serosanguineous N/A N/A Exudate Color: red, brown N/A N/A Mastrianni, Jahvier G. ([id number]) Wound Margin: Distinct, outline attached N/A N/A Granulation Amount: Medium (34-66%) N/A N/A Granulation Quality: Red N/A N/A Necrotic Amount: Medium (34-66%) N/A N/A Necrotic Tissue: Eschar, Adherent Slough N/A N/A Exposed Structures: Fascia: No N/A N/A Fat: No Tendon: No Muscle: No Joint: No Bone: No Limited to Skin Breakdown Epithelialization: None N/A N/A Periwound Skin Texture: No Abnormalities Noted N/A N/A Periwound Skin Moist: Yes N/A N/A Moisture: Periwound Skin Color: Erythema: Yes N/A N/A Erythema Location: Circumferential N/A N/A Temperature: No Abnormality N/A N/A Tenderness on Yes N/A N/A Palpation: Wound Preparation: Ulcer Cleansing: Other: N/A N/A soap and water Topical Anesthetic Applied: Other: lidocaine 4% Treatment Notes Electronic Signature(s) Signed: 12/01/2015 5:07:29 PM By:  Montey Hora Entered By: Montey Hora on 12/01/2015 15:49:55 [name] ([id number]) -------------------------------------------------------------------------------- [place] Details Patient Name: [name] Date of Service: 12/01/2015 3:30 PM Medical Record Number: [id number] Patient Account Number: [id number] Date of Birth/Sex: 1935/12/23 (80 y.o. Male) Treating RN: Montey Hora Primary Care Physician: Lamonte Sakai Other Clinician: Referring Physician: Lamonte Sakai Treating Physician/Extender: Tito Dine in Treatment: 1 Active Inactive Abuse / Safety / Falls / Self Care Management Nursing Diagnoses: Potential for falls Goals: Patient will remain injury free Date Initiated: 11/24/2015 Goal Status: Active Interventions: Assess fall risk on admission and as needed Notes: Nutrition Nursing Diagnoses: Imbalanced nutrition Impaired glucose control: actual or potential Potential for alteratiion in Nutrition/Potential for imbalanced nutrition Goals: Patient/caregiver agrees to and verbalizes understanding of need to use nutritional supplements and/or vitamins as prescribed Date Initiated: 11/24/2015 Goal Status: Active Patient/caregiver verbalizes understanding of need to maintain therapeutic glucose control per primary care physician Date Initiated: 11/24/2015 Goal Status: Active Patient/caregiver will maintain therapeutic glucose control Date Initiated: 11/24/2015 Goal Status: Active Interventions: Assess patient nutrition upon admission and as needed per policy TRAYCEN, GOYER ([id number]) Provide education on elevated blood sugars and impact on wound healing Notes: Orientation to the Wound Care Program Nursing Diagnoses: Knowledge deficit related to the wound healing center program Goals: Patient/caregiver will verbalize understanding of the [facility] Date Initiated: 11/24/2015 Goal Status:  Active Interventions: Provide education on orientation to the wound center Notes: Pain, Acute or Chronic Nursing Diagnoses: Pain, acute or chronic: actual or potential Potential alteration in comfort, pain Goals: Patient will verbalize adequate pain control and receive pain control interventions during procedures as needed Date Initiated: 11/24/2015 Goal Status: Active Patient/caregiver will verbalize adequate pain control between visits Date Initiated: 11/24/2015 Goal Status: Active Patient/caregiver will verbalize comfort level met Date Initiated: 11/24/2015 Goal Status: Active Interventions: Assess comfort goal upon admission Complete pain assessment as per visit requirements Notes: Wound/Skin Impairment Nursing Diagnoses: Impaired tissue integrity Schubert, Eustace Darnell Level ([id number]) Goals: Ulcer/skin breakdown will have a volume reduction of 30% by week 4 Date Initiated: 11/24/2015 Goal Status: Active Ulcer/skin breakdown will have a volume reduction of 50% by week 8 Date Initiated: 11/24/2015 Goal Status: Active Ulcer/skin breakdown will have a volume reduction of 80% by week 12 Date Initiated: 11/24/2015 Goal Status: Active Interventions: Assess patient/caregiver ability to perform ulcer/skin care regimen upon admission and as needed Notes: Electronic Signature(s) Signed: 12/01/2015 5:07:29 PM By: Montey Hora Entered By: Montey Hora  on 12/01/2015 15:48:50 AUSTYN, SEIER ([id number]) -------------------------------------------------------------------------------- Pain Assessment Details Patient Name: CARLIE, CORPUS. Date of Service: 12/01/2015 3:30 PM Medical Record Number: [id number] Patient Account Number: [id number] Date of Birth/Sex: Aug 05, 1935 (80 y.o. Male) Treating RN: Montey Hora Primary Care Physician: Lamonte Sakai Other Clinician: Referring Physician: Lamonte Sakai Treating Physician/Extender: Ricard Dillon Weeks in Treatment: 1 Active Problems Location of  Pain Severity and Description of Pain Patient Has Paino No Site Locations Pain Management and Medication Current Pain Management: Notes Topical or injectable lidocaine is offered to patient for acute pain when surgical debridement is performed. If needed, Patient is instructed to use over the counter pain medication for the following 24-48 hours after debridement. Wound care MDs do not prescribed pain medications. Patient has chronic pain or uncontrolled pain. Patient has been instructed to make an appointment with their Primary Care Physician for pain management. Electronic Signature(s) Signed: 12/01/2015 5:07:29 PM By: Montey Hora Entered By: Montey Hora on 12/01/2015 15:36:20 [name] ([id number]) -------------------------------------------------------------------------------- Patient/Caregiver Education Details Patient Name: [name] Date of Service: 12/01/2015 3:30 PM Medical Record Number: [id number] Patient Account Number: [id number] Date of Birth/Gender: 20-Feb-1936 (80 y.o. Male) Treating RN: Montey Hora Primary Care Physician: Lamonte Sakai Other Clinician: Referring Physician: Lamonte Sakai Treating Physician/Extender: Tito Dine in Treatment: 1 Education Assessment Education Provided To: Patient Education Topics Provided Venous: Handouts: Other: leg elevation Methods: Explain/Verbal Responses: State content correctly Electronic Signature(s) Signed: 12/01/2015 5:07:29 PM By: Montey Hora Entered By: Montey Hora on 12/01/2015 16:41:09 [name] ([id number]) -------------------------------------------------------------------------------- Wound Assessment Details Patient Name: [name] Date of Service: 12/01/2015 3:30 PM Medical Record Number: [id number] Patient Account Number: [id number] Date of Birth/Sex: [birth year] (80 y.o. Male) Treating RN: Montey Hora Primary Care Physician: Lamonte Sakai Other Clinician: Referring  Physician: Lamonte Sakai Treating Physician/Extender: Ricard Dillon Weeks in Treatment: 1 Wound Status Wound Number: 1 Primary Diabetic Wound/Ulcer of the Lower Etiology: Extremity Wound Location: Right Lower Leg - Lateral Secondary Trauma, Other Wounding Event: Trauma Etiology: Date Acquired: 10/20/2015 Wound Open Weeks Of Treatment: 1 Status: Clustered Wound: No Comorbid Cataracts, Asthma, Sleep Apnea, History: Coronary Artery Disease, Hypertension, Type II Diabetes, Osteoarthritis, Neuropathy Photos Wound Measurements Length: (cm) 3.7 Width: (cm) 1.7 Depth: (cm) 0.2 Area: (cm) 4.94 Volume: (cm) 0.988 % Reduction in Area: 35.5% % Reduction in Volume: -29% Epithelialization: None Tunneling: No Undermining: No Wound Description Classification: Grade 1 Wound Margin: Distinct, outline attached Exudate Amount: Large Exudate Type: Serosanguineous Exudate Color: red, brown Foul Odor After Cleansing: No Wound Bed Granulation Amount: Medium (34-66%) Exposed Structure Granulation Quality: Red Fascia Exposed: No Nordin, Ramsay G. ([id number]) Necrotic Amount: Medium (34-66%) Fat Layer Exposed: No Necrotic Quality: Eschar, Adherent Slough Tendon Exposed: No Muscle Exposed: No Joint Exposed: No Bone Exposed: No Limited to Skin Breakdown Periwound Skin Texture Texture Color No Abnormalities Noted: No No Abnormalities Noted: No Erythema: Yes Moisture Erythema Location: Circumferential No Abnormalities Noted: No Moist: Yes Temperature / Pain Temperature: No Abnormality Tenderness on Palpation: Yes Wound Preparation Ulcer Cleansing: Other: soap and water, Topical Anesthetic Applied: Other: lidocaine 4%, Treatment Notes Wound #1 (Right, Lateral Lower Leg) 1. Cleansed with: Cleanse wound with antibacterial soap and water 2. Anesthetic Topical Lidocaine 4% cream to wound bed prior to debridement 4. Dressing Applied: Prisma Ag 5. Secondary Dressing  Applied ABD Pad 7. Secured with 2 Layer Lite Compression System - Right Lower Extremity Electronic Signature(s) Signed: 12/01/2015 5:07:29 PM By: Montey Hora Entered By: Montey Hora on 12/01/2015  15:48:16 WADE, ASEBEDO ([id number]) -------------------------------------------------------------------------------- Vitals Details Patient Name: NASON, CONRADT. Date of Service: 12/01/2015 3:30 PM Medical Record Number: [id number] Patient Account Number: [id number] Date of Birth/Sex: April 15, 1935 (80 y.o. Male) Treating RN: Montey Hora Primary Care Physician: Lamonte Sakai Other Clinician: Referring Physician: Lamonte Sakai Treating Physician/Extender: Ricard Dillon Weeks in Treatment: 1 Vital Signs Time Taken: 15:36 Temperature (F): 98.2 Height (in): 72 Pulse (bpm): 62 Weight (lbs): 160 Respiratory Rate (breaths/min): 18 Body Mass Index (BMI): 21.7 Blood Pressure (mmHg): 133/59 Reference Range: 80 - 120 mg / dl Electronic Signature(s) Signed: 12/01/2015 5:07:29 PM By: Montey Hora Entered By: Montey Hora on 12/01/2015 15:38:39

## 2015-12-01 NOTE — Progress Notes (Signed)
Todd Davidson, Todd Davidson ([id number]) Visit Report for 12/01/2015 Chief Complaint Document Details Patient Name: Todd Davidson, Todd Davidson. Date of Service: 12/01/2015 3:30 PM Medical Record Patient Account Number: [id number] Number: Treating RN: Montey Hora 15-Nov-1935 (80 y.o. Other Clinician: Date of Birth/Sex: Male) Treating Delaney Schnick, [place] Primary Care Physician: Lamonte Sakai Physician/Extender: G Referring Physician: Lamonte Sakai Weeks in Treatment: 1 Information Obtained from: Patient Chief Complaint 11/24/15; this is a patient to is here for a chronic wound on his right lower leg Electronic Signature(s) Signed: 12/01/2015 4:19:04 PM By: Linton Ham MD Entered By: Linton Ham on 12/01/2015 16:15:07 Todd Davidson ([id number]) -------------------------------------------------------------------------------- Debridement Details Patient Name: Todd Davidson Date of Service: 12/01/2015 3:30 PM Medical Record Patient Account Number: [id number] Number: Treating RN: Montey Hora [birth year] (80 y.o. Other Clinician: Date of Birth/Sex: Male) Treating Craig Wisnewski, [place] Primary Care Physician: Lamonte Sakai Physician/Extender: G Referring Physician: Orland Mustard in Treatment: 1 Debridement Performed for Wound #1 Right,Lateral Lower Leg Assessment: Performed By: Physician Ricard Dillon, MD Debridement: Debridement Pre-procedure Yes - 15:53 Verification/Time Out Taken: Start Time: 15:54 Pain Control: Lidocaine 4% Topical Solution Level: Skin/Subcutaneous Tissue Total Area Debrided (L x 3.7 (cm) x 1.7 (cm) = 6.29 (cm) W): Tissue and other Viable, Non-Viable, Eschar, Fibrin/Slough, Subcutaneous material debrided: Instrument: Curette Bleeding: Minimum Hemostasis Achieved: Pressure End Time: 15:56 Procedural Pain: 0 Post Procedural Pain: 0 Response to Treatment: Procedure was tolerated well Post Debridement Measurements of Total Wound Length: (cm) 3.7 Width:  (cm) 1.7 Depth: (cm) 0.2 Volume: (cm) 0.988 Character of Wound/Ulcer Post Requires Further Debridement Debridement: Severity of Tissue Post Debridement: Fat layer exposed Post Procedure Diagnosis Same as Pre-procedure Electronic Signature(s) Signed: 12/01/2015 4:19:04 PM By: Linton Ham MD Signed: 12/01/2015 5:07:29 PM By: Lowry Ram ([id number]) Entered By: Linton Ham on 12/01/2015 16:14:51 Todd Davidson ([id number]) -------------------------------------------------------------------------------- HPI Details Patient Name: Todd Davidson Date of Service: 12/01/2015 3:30 PM Medical Record Patient Account Number: [id number] Number: Treating RN: Montey Hora 1935/11/29 (80 y.o. Other Clinician: Date of Birth/Sex: Male) Treating Jyoti Harju, [place] Primary Care Physician: Lamonte Sakai Physician/Extender: G Referring Physician: Lamonte Sakai Weeks in Treatment: 1 History of Present Illness HPI Description: 11/24/15; this patient is a 80 year old man with type 2 diabetes on insulin. While mowing the lawn on August 1 the patient traumatized his right leg. He was seen in the ER and had 8 sutures. He tells me the sutures were removed all over my understanding the wound at already dehisced. He has been using peroxide and Betadine on the wound. There is a fair amount of pain but no drainage. He probably has diabetic neuropathy. He has no known history of PAD. ABIs in this clinic were 1.02 on the right and 1.11 on the left. 12/01/15; the patient's wound is down 1 cm in width. Surface still required debridement Electronic Signature(s) Signed: 12/01/2015 4:19:04 PM By: Linton Ham MD Entered By: Linton Ham on 12/01/2015 16:15:36 Todd Davidson ([id number]) -------------------------------------------------------------------------------- Physical Exam Details Patient Name: Todd Davidson Date of Service: 12/01/2015 3:30 PM Medical Record Patient Account  Number: [id number] Number: Treating RN: Montey Hora 1935-12-09 (80 y.o. Other Clinician: Date of Birth/Sex: Male) Treating Hyrum Shaneyfelt, [place] Primary Care Physician: Lamonte Sakai Physician/Extender: G Referring Physician: Lamonte Sakai Weeks in Treatment: 1 Constitutional Sitting or standing Blood Pressure is within target range for patient.. Pulse regular and within target range for patient.Marland Kitchen Respirations regular, non-labored and within target range.. Temperature is normal and within the target range for  the patient.Marland Kitchen Respiratory Respiratory effort is easy and symmetric bilaterally. Rate is normal at rest and on room air.. Cardiovascular Pedal pulses palpable and strong bilaterally.. Notes Wound exam; the area is certainly down in wound area. Still requires debridement post debridement the surface of this looks viable. No evidence of surrounding infection. Peripheral pulses intact Electronic Signature(s) Signed: 12/01/2015 4:19:04 PM By: Linton Ham MD Entered By: Linton Ham on 12/01/2015 16:16:42 Todd Davidson ([id number]) -------------------------------------------------------------------------------- Physician Orders Details Patient Name: Todd Davidson Date of Service: 12/01/2015 3:30 PM Medical Record Patient Account Number: [id number] Number: Treating RN: Montey Hora Aug 10, 1935 (80 y.o. Other Clinician: Date of Birth/Sex: Male) Treating Bertran Zeimet, [place] Primary Care Physician: Lamonte Sakai Physician/Extender: G Referring Physician: Orland Mustard in Treatment: 1 Verbal / Phone Orders: Yes Clinician: Montey Hora Read Back and Verified: Yes Diagnosis Coding Wound Cleansing Wound #1 Right,Lateral Lower Leg o Clean wound with Normal Saline. Anesthetic Wound #1 Right,Lateral Lower Leg o Topical Lidocaine 4% cream applied to wound bed prior to debridement Primary Wound Dressing Wound #1 Right,Lateral Lower Leg o Prisma  Ag Secondary Dressing Wound #1 Right,Lateral Lower Leg o ABD pad o Dry Gauze Dressing Change Frequency Wound #1 Right,Lateral Lower Leg o Change dressing every week Follow-up Appointments Wound #1 Right,Lateral Lower Leg o Return Appointment in 1 week. Edema Control Wound #1 Right,Lateral Lower Leg o 2 Layer Lite Compression System - Right Lower Extremity o Elevate legs to the level of the heart and pump ankles as often as possible Additional Orders / Instructions Wound #1 Right,Lateral Lower Leg o Increase protein intake. Todd Davidson, Todd Davidson ([id number]) Electronic Signature(s) Signed: 12/01/2015 4:19:04 PM By: Linton Ham MD Signed: 12/01/2015 5:07:29 PM By: Montey Hora Entered By: Montey Hora on 12/01/2015 15:57:25 Todd Davidson ([id number]) -------------------------------------------------------------------------------- Problem List Details Patient Name: Todd Davidson Date of Service: 12/01/2015 3:30 PM Medical Record Patient Account Number: [id number] Number: Treating RN: Montey Hora 1935/08/03 (80 y.o. Other Clinician: Date of Birth/Sex: Male) Treating Livier Hendel, [place] Primary Care Physician: Lamonte Sakai Physician/Extender: G Referring Physician: Lamonte Sakai Weeks in Treatment: 1 Active Problems ICD-10 Encounter Code Description Active Date Diagnosis I87.311 Chronic venous hypertension (idiopathic) with ulcer of 11/24/2015 Yes right lower extremity S86.821D Laceration of other muscle(s) and tendon(s) at lower leg 11/24/2015 Yes level, right leg, subsequent encounter Inactive Problems Resolved Problems Electronic Signature(s) Signed: 12/01/2015 4:19:04 PM By: Linton Ham MD Entered By: Linton Ham on 12/01/2015 16:14:37 Todd Davidson ([id number]) -------------------------------------------------------------------------------- Progress Note Details Patient Name: Todd Davidson Date of Service: 12/01/2015 3:30 PM Medical  Record Patient Account Number: [id number] Number: Treating RN: Montey Hora March 12, 1936 (80 y.o. Other Clinician: Date of Birth/Sex: Male) Treating Javante Nilsson, [place] Primary Care Physician: Lamonte Sakai Physician/Extender: G Referring Physician: Lamonte Sakai Weeks in Treatment: 1 Subjective Chief Complaint Information obtained from Patient 11/24/15; this is a patient to is here for a chronic wound on his right lower leg History of Present Illness (HPI) 11/24/15; this patient is a 80 year old man with type 2 diabetes on insulin. While mowing the lawn on August 1 the patient traumatized his right leg. He was seen in the ER and had 8 sutures. He tells me the sutures were removed all over my understanding the wound at already dehisced. He has been using peroxide and Betadine on the wound. There is a fair amount of pain but no drainage. He probably has diabetic neuropathy. He has no known history of PAD. ABIs in this clinic were 1.02 on the  right and 1.11 on the left. 12/01/15; the patient's wound is down 1 cm in width. Surface still required debridement Objective Constitutional Sitting or standing Blood Pressure is within target range for patient.. Pulse regular and within target range for patient.Marland Kitchen Respirations regular, non-labored and within target range.. Temperature is normal and within the target range for the patient.. Vitals Time Taken: 3:36 PM, Height: 72 in, Weight: 160 lbs, BMI: 21.7, Temperature: 98.2 F, Pulse: 62 bpm, Respiratory Rate: 18 breaths/min, Blood Pressure: 133/59 mmHg. Respiratory Respiratory effort is easy and symmetric bilaterally. Rate is normal at rest and on room air.. Cardiovascular Pedal pulses palpable and strong bilaterally.. General Notes: Wound exam; the area is certainly down in wound area. Still requires debridement post Todd Davidson, Todd Davidson. ([id number]) debridement the surface of this looks viable. No evidence of surrounding infection. Peripheral  pulses intact Integumentary (Hair, Skin) Wound #1 status is Open. Original cause of wound was Trauma. The wound is located on the Right,Lateral Lower Leg. The wound measures 3.7cm length x 1.7cm width x 0.2cm depth; 4.94cm^2 area and 0.988cm^3 volume. The wound is limited to skin breakdown. There is no tunneling or undermining noted. There is a large amount of serosanguineous drainage noted. The wound margin is distinct with the outline attached to the wound base. There is medium (34-66%) red granulation within the wound bed. There is a medium (34-66%) amount of necrotic tissue within the wound bed including Eschar and Adherent Slough. The periwound skin appearance exhibited: Moist, Erythema. The surrounding wound skin color is noted with erythema which is circumferential. Periwound temperature was noted as No Abnormality. The periwound has tenderness on palpation. Assessment Active Problems ICD-10 I87.311 - Chronic venous hypertension (idiopathic) with ulcer of right lower extremity S86.821D - Laceration of other muscle(s) and tendon(s) at lower leg level, right leg, subsequent encounter Procedures Wound #1 Wound #1 is a Diabetic Wound/Ulcer of the Lower Extremity located on the Right,Lateral Lower Leg . There was a Skin/Subcutaneous Tissue Debridement [id number]) debridement with total area of 6.29 sq cm performed by Ricard Dillon, MD. with the following instrument(s): Curette to remove Viable and Non-Viable tissue/material including Fibrin/Slough, Eschar, and Subcutaneous after achieving pain control using Lidocaine 4% Topical Solution. A time out was conducted at 15:53, prior to the start of the procedure. A Minimum amount of bleeding was controlled with Pressure. The procedure was tolerated well with a pain level of 0 throughout and a pain level of 0 following the procedure. Post Debridement Measurements: 3.7cm length x 1.7cm width x 0.2cm depth; 0.988cm^3 volume. Character of  Wound/Ulcer Post Debridement requires further debridement. Severity of Tissue Post Debridement is: Fat layer exposed. Post procedure Diagnosis Wound #1: Same as Pre-Procedure Todd Davidson, Todd G. ([id number]) Plan Wound Cleansing: Wound #1 Right,Lateral Lower Leg: Clean wound with Normal Saline. Anesthetic: Wound #1 Right,Lateral Lower Leg: Topical Lidocaine 4% cream applied to wound bed prior to debridement Primary Wound Dressing: Wound #1 Right,Lateral Lower Leg: Prisma Ag Secondary Dressing: Wound #1 Right,Lateral Lower Leg: ABD pad Dry Gauze Dressing Change Frequency: Wound #1 Right,Lateral Lower Leg: Change dressing every week Follow-up Appointments: Wound #1 Right,Lateral Lower Leg: Return Appointment in 1 week. Edema Control: Wound #1 Right,Lateral Lower Leg: 2 Layer Lite Compression System - Right Lower Extremity Elevate legs to the level of the heart and pump ankles as often as possible Additional Orders / Instructions: Wound #1 Right,Lateral Lower Leg: Increase protein intake. #1 I elected to continue using Prisma rather than moving to [facility] for Iodoflex. Largely this  decision was based on the fact that the wound dimensions had improved even though there was continued surface of the required debridement. #22 layer compression Electronic Signature(s) Signed: 12/01/2015 4:19:04 PM By: Linton Ham MD Entered By: Linton Ham on 12/01/2015 16:17:24 Todd Davidson ([name] ([id number]) -------------------------------------------------------------------------------- SuperBill Details Patient Name: Todd Davidson Date of Service: 12/01/2015 Medical Record Patient Account Number: [id number] Number: Treating RN: Montey Hora [birth year] (80 y.o. Other Clinician: Date of Birth/Sex: Male) Treating Charmayne Odell, [place] Primary Care Physician: Lamonte Sakai Physician/Extender: G Referring Physician: Lamonte Sakai Weeks in Treatment: 1 Diagnosis  Coding ICD-10 Codes Code Description I87.311 Chronic venous hypertension (idiopathic) with ulcer of right lower extremity Laceration of other muscle(s) and tendon(s) at lower leg level, right leg, subsequent S86.821D encounter Facility Procedures CPT4 Code Description: [id number] - DEB SUBQ TISSUE 20 SQ CM/< ICD-10 Description Diagnosis I87.311 Chronic venous hypertension (idiopathic) with ulcer Modifier: of right lower Quantity: 1 extremity Physician Procedures CPT4 Code Description: [id number] - WC PHYS SUBQ TISS 20 SQ CM ICD-10 Description Diagnosis I87.311 Chronic venous hypertension (idiopathic) with ulcer Modifier: of right lower Quantity: 1 extremity Electronic Signature(s) Signed: 12/01/2015 4:19:04 PM By: Linton Ham MD Entered By: Linton Ham on 12/01/2015 16:17:52

## 2015-12-08 ENCOUNTER — Encounter: Payer: Medicare Other | Admitting: Internal Medicine

## 2015-12-08 DIAGNOSIS — I87311 Chronic venous hypertension (idiopathic) with ulcer of right lower extremity: Secondary | ICD-10-CM | POA: Diagnosis not present

## 2015-12-08 NOTE — Progress Notes (Signed)
ABRAHAN, PAGETT ([id number]) Visit Report for 12/08/2015 Chief Complaint Document Details Patient Name: Todd Davidson, Todd Davidson. Date of Service: 12/08/2015 2:15 PM Medical Record Patient Account Number: [id number] Number: Treating RN: Cornell Barman 16-May-1935 (80 y.o. Other Clinician: Date of Birth/Sex: Male) Treating ROBSON, Ruso Primary Care Physician: Lamonte Sakai Physician/Extender: G Referring Physician: Lamonte Sakai Weeks in Treatment: 2 Information Obtained from: Patient Chief Complaint 11/24/15; this is a patient to is here for a chronic wound on his right lower leg Electronic Signature(s) Signed: 12/08/2015 3:24:48 PM By: Linton Ham MD Entered By: Linton Ham on 12/08/2015 14:50:51 Todd Davidson ([id number]) -------------------------------------------------------------------------------- Debridement Details Patient Name: Todd Davidson Date of Service: 12/08/2015 2:15 PM Medical Record Patient Account Number: [id number] Number: Treating RN: Cornell Barman 1935-10-13 (80 y.o. Other Clinician: Date of Birth/Sex: Male) Treating ROBSON, Perkins Primary Care Physician: Lamonte Sakai Physician/Extender: G Referring Physician: Orland Mustard in Treatment: 2 Debridement Performed for Wound #1 Right,Lateral Lower Leg Assessment: Performed By: Physician Ricard Dillon, MD Debridement: Debridement Pre-procedure Yes - 14:40 Verification/Time Out Taken: Start Time: 14:42 Pain Control: Other : lidocaine 4% Level: Skin/Subcutaneous Tissue Total Area Debrided (L x 3 (cm) x 1.5 (cm) = 4.5 (cm) W): Tissue and other Viable, Non-Viable, Fibrin/Slough, Subcutaneous material debrided: Instrument: Curette Bleeding: Minimum Hemostasis Achieved: Pressure End Time: 14:46 Procedural Pain: 0 Post Procedural Pain: 0 Response to Treatment: Procedure was tolerated well Post Debridement Measurements of Total Wound Length: (cm) 3 Width: (cm) 1.5 Depth: (cm)  0.1 Volume: (cm) 0.353 Character of Wound/Ulcer Post Requires Further Debridement Debridement: Severity of Tissue Post Debridement: Fat layer exposed Post Procedure Diagnosis Same as Pre-procedure Electronic Signature(s) Signed: 12/08/2015 3:24:48 PM By: Linton Ham MD Signed: 12/08/2015 4:47:59 PM By: Gretta Cool RN, BSN, Kim RN, BSN Sutton, Lawernce Keas ([id number]) Entered By: Gretta Cool, RN, BSN, Kim on 12/08/2015 14:56:09 Todd Davidson ([id number]) -------------------------------------------------------------------------------- HPI Details Patient Name: Todd Davidson Date of Service: 12/08/2015 2:15 PM Medical Record Patient Account Number: [id number] Number: Treating RN: Cornell Barman Mar 14, 1936 (80 y.o. Other Clinician: Date of Birth/Sex: Male) Treating ROBSON, [place] Primary Care Physician: Lamonte Sakai Physician/Extender: G Referring Physician: Lamonte Sakai Weeks in Treatment: 2 History of Present Illness HPI Description: 11/24/15; this patient is a 80 year old man with type 2 diabetes on insulin. While mowing the lawn on August 1 the patient traumatized his right leg. He was seen in the ER and had 8 sutures. He tells me the sutures were removed all over my understanding the wound at already dehisced. He has been using peroxide and Betadine on the wound. There is a fair amount of pain but no drainage. He probably has diabetic neuropathy. He has no known history of PAD. ABIs in this clinic were 1.02 on the right and 1.11 on the left. 12/01/15; the patient's wound is down 1 cm in width. Surface still required debridement 12/08/15; patient's wound continues to improve although he continues to require debridement. I have been using Prisma I will to Iodoflex. Patient has 2 superficial open areas over his patella on the right Electronic Signature(s) Signed: 12/08/2015 3:24:48 PM By: Linton Ham MD Entered By: Linton Ham on 12/08/2015 14:51:57 Todd Davidson  ([id number]) -------------------------------------------------------------------------------- Physical Exam Details Patient Name: Todd Davidson Date of Service: 12/08/2015 2:15 PM Medical Record Patient Account Number: [id number] Number: Treating RN: Cornell Barman 14-Apr-1935 (80 y.o. Other Clinician: Date of Birth/Sex: Male) Treating ROBSON, [place] Primary Care Physician: Lamonte Sakai Physician/Extender: G Referring Physician: Lamonte Sakai Weeks in  Treatment: 2 Constitutional Sitting or standing Blood Pressure is within target range for patient.. Pulse regular and within target range for patient.Marland Kitchen Respirations regular, non-labored and within target range.. Temperature is normal and within the target range for the patient.. Patient's appearance is neat and clean. Appears in no acute distress. Well nourished and well developed.. Cardiovascular Pedal pulses palpable and strong bilaterally.. Notes Wound exam; the area is certainly down in wound area still requires debridement. I'm going to change the dressing to Iodoflex. Peripheral pulses are intact there is no evidence of infection Electronic Signature(s) Signed: 12/08/2015 3:24:48 PM By: Linton Ham MD Entered By: Linton Ham on 12/08/2015 14:54:51 Todd Davidson ([id number]) -------------------------------------------------------------------------------- Physician Orders Details Patient Name: Todd Davidson Date of Service: 12/08/2015 2:15 PM Medical Record Patient Account Number: [id number] Number: Treating RN: Cornell Barman [birth year] (80 y.o. Other Clinician: Date of Birth/Sex: Male) Treating ROBSON, [place] Primary Care Physician: Lamonte Sakai Physician/Extender: G Referring Physician: Orland Mustard in Treatment: 2 Verbal / Phone Orders: Yes Clinician: Cornell Barman Read Back and Verified: Yes Diagnosis Coding ICD-10 Coding Code Description I87.311 Chronic venous hypertension (idiopathic) with  ulcer of right lower extremity Laceration of other muscle(s) and tendon(s) at lower leg level, right leg, subsequent S86.821D encounter Wound Cleansing Wound #1 Right,Lateral Lower Leg o Clean wound with Normal Saline. Anesthetic Wound #1 Right,Lateral Lower Leg o Topical Lidocaine 4% cream applied to wound bed prior to debridement Primary Wound Dressing Wound #1 Right,Lateral Lower Leg o Iodoflex Secondary Dressing Wound #1 Right,Lateral Lower Leg o ABD pad o Dry Gauze Dressing Change Frequency Wound #1 Right,Lateral Lower Leg o Change dressing every week Follow-up Appointments Wound #1 Right,Lateral Lower Leg o Return Appointment in 1 week. Edema Control Wound #1 Right,Lateral Lower Leg Kipnis, Asmar G. ([id number]) o 2 Layer Lite Compression System - Right Lower Extremity o Elevate legs to the level of the heart and pump ankles as often as possible Additional Orders / Instructions Wound #1 Right,Lateral Lower Leg o Increase protein intake. Electronic Signature(s) Signed: 12/08/2015 3:24:48 PM By: Linton Ham MD Signed: 12/08/2015 4:47:59 PM By: Gretta Cool RN, BSN, Kim RN, BSN Entered By: Gretta Cool, RN, BSN, Kim on 12/08/2015 14:54:39 Todd Davidson ([id number]) -------------------------------------------------------------------------------- Problem List Details Patient Name: Todd Davidson Date of Service: 12/08/2015 2:15 PM Medical Record Patient Account Number: [id number] Number: Treating RN: Cornell Barman [birth year] (79 y.o. Other Clinician: Date of Birth/Sex: Male) Treating ROBSON, Learned Primary Care Physician: Lamonte Sakai Physician/Extender: G Referring Physician: Lamonte Sakai Weeks in Treatment: 2 Active Problems ICD-10 Encounter Code Description Active Date Diagnosis I87.311 Chronic venous hypertension (idiopathic) with ulcer of 11/24/2015 Yes right lower extremity S86.821D Laceration of other muscle(s) and tendon(s) at lower leg  11/24/2015 Yes level, right leg, subsequent encounter Inactive Problems Resolved Problems Electronic Signature(s) Signed: 12/08/2015 3:24:48 PM By: Linton Ham MD Entered By: Linton Ham on 12/08/2015 14:50:36 Todd Davidson ([id number]) -------------------------------------------------------------------------------- Progress Note Details Patient Name: Todd Davidson Date of Service: 12/08/2015 2:15 PM Medical Record Patient Account Number: [id number] Number: Treating RN: Cornell Barman 19-Oct-1935 (79 y.o. Other Clinician: Date of Birth/Sex: Male) Treating ROBSON, [place] Primary Care Physician: Lamonte Sakai Physician/Extender: G Referring Physician: Lamonte Sakai Weeks in Treatment: 2 Subjective Chief Complaint Information obtained from Patient 11/24/15; this is a patient to is here for a chronic wound on his right lower leg History of Present Illness (HPI) 11/24/15; this patient is a 80 year old man with type 2 diabetes on insulin. While mowing the lawn on  August 1 the patient traumatized his right leg. He was seen in the ER and had 8 sutures. He tells me the sutures were removed all over my understanding the wound at already dehisced. He has been using peroxide and Betadine on the wound. There is a fair amount of pain but no drainage. He probably has diabetic neuropathy. He has no known history of PAD. ABIs in this clinic were 1.02 on the right and 1.11 on the left. 12/01/15; the patient's wound is down 1 cm in width. Surface still required debridement 12/08/15; patient's wound continues to improve although he continues to require debridement. I have been using Prisma I will to Iodoflex. Patient has 2 superficial open areas over his patella on the right Objective Constitutional Sitting or standing Blood Pressure is within target range for patient.. Pulse regular and within target range for patient.Marland Kitchen Respirations regular, non-labored and within target range.. Temperature is  normal and within the target range for the patient.. Patient's appearance is neat and clean. Appears in no acute distress. Well nourished and well developed.. Vitals Time Taken: 2:31 PM, Height: 72 in, Weight: 160 lbs, BMI: 21.7, Temperature: 98.5 F, Pulse: 60 bpm, Respiratory Rate: 16 breaths/min, Blood Pressure: 111/55 mmHg. Cardiovascular Pedal pulses palpable and strong bilaterally.. General Notes: Wound exam; the area is certainly down in wound area still requires debridement. I'm going Whipkey, Bryn G. ([id number]) to change the dressing to Iodoflex. Peripheral pulses are intact there is no evidence of infection Integumentary (Hair, Skin) Wound #1 status is Open. Original cause of wound was Trauma. The wound is located on the Right,Lateral Lower Leg. The wound measures 3cm length x 1.5cm width x 0.1cm depth; 3.534cm^2 area and 0.353cm^3 volume. The wound is limited to skin breakdown. There is no tunneling or undermining noted. There is a large amount of serosanguineous drainage noted. The wound margin is distinct with the outline attached to the wound base. There is medium (34-66%) red granulation within the wound bed. There is a medium (34-66%) amount of necrotic tissue within the wound bed including Eschar and Adherent Slough. The periwound skin appearance exhibited: Scarring, Moist, Erythema. The surrounding wound skin color is noted with erythema which is circumferential. Periwound temperature was noted as No Abnormality. The periwound has tenderness on palpation. Wound #2 status is Open. Original cause of wound was Trauma. The wound is located on the Right Knee. The wound measures 5cm length x 3.5cm width x 0.1cm depth; 13.744cm^2 area and 1.374cm^3 volume. The wound is limited to skin breakdown. There is no tunneling or undermining noted. There is a small amount of serous drainage noted. The wound margin is flat and intact. There is large (67-100%) red granulation within the wound  bed. There is a small (1-33%) amount of necrotic tissue within the wound bed including Eschar. The periwound skin appearance exhibited: Dry/Scaly. The periwound skin appearance did not exhibit: Callus, Crepitus, Excoriation, Fluctuance, Friable, Induration, Localized Edema, Rash, Scarring, Maceration, Moist, Atrophie Blanche, Cyanosis, Ecchymosis, Hemosiderin Staining, Mottled, Pallor, Rubor, Erythema. Assessment Active Problems ICD-10 I87.311 - Chronic venous hypertension (idiopathic) with ulcer of right lower extremity S86.821D - Laceration of other muscle(s) and tendon(s) at lower leg level, right leg, subsequent encounter Plan Wound Cleansing: Wound #1 Right,Lateral Lower Leg: Clean wound with Normal Saline. Anesthetic: Wound #1 Right,Lateral Lower Leg: Topical Lidocaine 4% cream applied to wound bed prior to debridement Primary Wound Dressing: Wound #1 Right,Lateral Lower Leg: Iodoflex HAYSE, GLENNEY ([id number]) Secondary Dressing: Wound #1 Right,Lateral Lower Leg: ABD pad  Dry Gauze Dressing Change Frequency: Wound #1 Right,Lateral Lower Leg: Change dressing every week Follow-up Appointments: Wound #1 Right,Lateral Lower Leg: Return Appointment in 1 week. Edema Control: Wound #1 Right,Lateral Lower Leg: 2 Layer Lite Compression System - Right Lower Extremity Elevate legs to the level of the heart and pump ankles as often as possible Additional Orders / Instructions: Wound #1 Right,Lateral Lower Leg: Increase protein intake. #1change to iodoflex,2 layer #2 superficial areas on right patella. AB ointment and large bandaide Electronic Signature(s) Signed: 12/08/2015 3:24:48 PM By: Linton Ham MD Entered By: Linton Ham on 12/08/2015 14:56:59 Todd Davidson ([id number]) -------------------------------------------------------------------------------- SuperBill Details Patient Name: Todd Davidson Date of Service: 12/08/2015 Medical Record Patient Account Number:  [id number] Number: Treating RN: Cornell Barman [birth year] (79 y.o. Other Clinician: Date of Birth/Sex: Male) Treating ROBSON, [place] Primary Care Physician: Lamonte Sakai Physician/Extender: G Referring Physician: Lamonte Sakai Weeks in Treatment: 2 Diagnosis Coding ICD-10 Codes Code Description I87.311 Chronic venous hypertension (idiopathic) with ulcer of right lower extremity Laceration of other muscle(s) and tendon(s) at lower leg level, right leg, subsequent S86.821D encounter Facility Procedures CPT4 Code Description: [id number] - DEB SUBQ TISSUE 20 SQ CM/< ICD-10 Description Diagnosis I87.311 Chronic venous hypertension (idiopathic) with ulcer Modifier: of right lower Quantity: 1 extremity Physician Procedures CPT4 Code Description: [id number] - WC PHYS SUBQ TISS 20 SQ CM ICD-10 Description Diagnosis I87.311 Chronic venous hypertension (idiopathic) with ulcer Modifier: of right lower Quantity: 1 extremity Electronic Signature(s) Signed: 12/08/2015 3:24:48 PM By: Linton Ham MD Entered By: Linton Ham on 12/08/2015 14:57:48

## 2015-12-08 NOTE — Progress Notes (Signed)
GRESHAM, CAETANO ([id number]) Visit Report for 12/08/2015 Arrival Information Details Patient Name: Todd Davidson, Todd Davidson. Date of Service: 12/08/2015 2:15 PM Medical Record Number: [id number] Patient Account Number: [id number] Date of Birth/Sex: November 25, 1935 (80 y.o. Male) Treating RN: Cornell Barman Primary Care Physician: Lamonte Sakai Other Clinician: Referring Physician: Lamonte Sakai Treating Physician/Extender: Tito Dine in Treatment: 2 Visit Information History Since Last Visit Added or deleted any medications: No Patient Arrived: Ambulatory Any new allergies or adverse reactions: No Arrival Time: 14:31 Had a fall or experienced change in No Accompanied By: self activities of daily living that may affect Transfer Assistance: None risk of falls: Patient Identification Verified: Yes Signs or symptoms of abuse/neglect since last No Secondary Verification Process Yes visito Completed: Hospitalized since last visit: No Patient Requires Transmission-Based No Has Dressing in Place as Prescribed: Yes Precautions: Pain Present Now: No Patient Has Alerts: Yes Patient Alerts: DM II Electronic Signature(s) Signed: 12/08/2015 4:47:59 PM By: Gretta Cool, RN, BSN, Kim RN, BSN Entered By: Gretta Cool, RN, BSN, Kim on 12/08/2015 14:31:36 Todd Davidson ([id number]) -------------------------------------------------------------------------------- Encounter Discharge Information Details Patient Name: Todd Davidson Date of Service: 12/08/2015 2:15 PM Medical Record Number: [id number] Patient Account Number: [id number] Date of Birth/Sex: 10-30-1935 (80 y.o. Male) Treating RN: Cornell Barman Primary Care Physician: Lamonte Sakai Other Clinician: Referring Physician: Lamonte Sakai Treating Physician/Extender: Tito Dine in Treatment: 2 Encounter Discharge Information Items Discharge Pain Level: 0 Discharge Condition: Stable Ambulatory Status: Ambulatory Discharge Destination:  Home Transportation: Private Auto Accompanied By: self Schedule Follow-up Appointment: Yes Medication Reconciliation completed and provided to Patient/Care No : Provided on Clinical Summary of Care: 12/08/2015 Form Type Recipient Paper Patient LI Electronic Signature(s) Signed: 12/08/2015 4:47:59 PM By: Gretta Cool RN, BSN, Kim RN, BSN Previous Signature: 12/08/2015 2:54:59 PM Version By: Ruthine Dose Entered By: Gretta Cool RN, BSN, Kim on 12/08/2015 14:57:07 Todd Davidson ([id number]) -------------------------------------------------------------------------------- Lower Extremity Assessment Details Patient Name: Todd Davidson Date of Service: 12/08/2015 2:15 PM Medical Record Number: [id number] Patient Account Number: [id number] Date of Birth/Sex: 1935/12/03 (80 y.o. Male) Treating RN: Cornell Barman Primary Care Physician: Lamonte Sakai Other Clinician: Referring Physician: Lamonte Sakai Treating Physician/Extender: Ricard Dillon Weeks in Treatment: 2 Edema Assessment Assessed: [Left: No] [Right: No] E[Left: dema] [Right: :] Calf Left: Right: Point of Measurement: 32 cm From Medial Instep cm 28 cm Ankle Left: Right: Point of Measurement: 10 cm From Medial Instep cm 20.8 cm Vascular Assessment Pulses: Posterior Tibial Dorsalis Pedis Palpable: [Right:Yes] Extremity colors, hair growth, and conditions: Extremity Color: [Right:Hyperpigmented] Hair Growth on Extremity: [Right:Yes] Temperature of Extremity: [Right:Warm] Capillary Refill: [Right:< 3 seconds] Dependent Rubor: [Right:No] Blanched when Elevated: [Right:No] Lipodermatosclerosis: [Right:No] Toe Nail Assessment Left: Right: Thick: Yes Discolored: Yes Deformed: No Improper Length and Hygiene: No Electronic Signature(s) Signed: 12/08/2015 4:47:59 PM By: Gretta Cool RN, BSN, Kim RN, BSN Spurling, Lawernce Keas ([id number]) Entered By: Gretta Cool, RN, BSN, Kim on 12/08/2015 14:38:52 Todd Davidson  ([id number]) -------------------------------------------------------------------------------- Multi Wound Chart Details Patient Name: Todd Davidson Date of Service: 12/08/2015 2:15 PM Medical Record Number: [id number] Patient Account Number: [id number] Date of Birth/Sex: 07-04-1935 (80 y.o. Male) Treating RN: Cornell Barman Primary Care Physician: Lamonte Sakai Other Clinician: Referring Physician: Lamonte Sakai Treating Physician/Extender: Ricard Dillon Weeks in Treatment: 2 Vital Signs Height(in): 72 Pulse(bpm): 60 Weight(lbs): 160 Blood Pressure 111/55 (mmHg): Body Mass Index(BMI): 22 Temperature(F): 98.5 Respiratory Rate 16 (breaths/min): Photos: [N/A:N/A] Wound Location: Right Lower Leg - Lateral Right Knee N/A Wounding Event: Trauma Trauma N/A Primary  Etiology: Diabetic Wound/Ulcer of Trauma, Other N/A the Lower Extremity Secondary Etiology: Trauma, Other N/A N/A Comorbid History: Cataracts, Asthma, Sleep Cataracts, Asthma, Sleep N/A Apnea, Coronary Artery Apnea, Coronary Artery Disease, Hypertension, Disease, Hypertension, Type II Diabetes, Type II Diabetes, Osteoarthritis, Neuropathy Osteoarthritis, Neuropathy Date Acquired: 10/20/2015 12/04/2015 N/A Weeks of Treatment: 2 0 N/A Wound Status: Open Open N/A Clustered Wound: No Yes N/A Measurements L x W x D 3x1.5x0.1 5x3.5x0.1 N/A (cm) Area (cm) : 3.534 13.744 N/A Volume (cm) : 0.353 1.374 N/A % Reduction in Area: 53.90% N/A N/A % Reduction in Volume: 53.90% N/A N/A Classification: Grade 1 Partial Thickness N/A HBO Classification: N/A Grade 1 N/A Exudate Amount: Large Small N/A Exudate Type: Serosanguineous Serous N/A Todd Davidson, Todd Davidson. ([id number]) Exudate Color: red, brown amber N/A Wound Margin: Distinct, outline attached Flat and Intact N/A Granulation Amount: Medium (34-66%) Large (67-100%) N/A Granulation Quality: Red Red N/A Necrotic Amount: Medium (34-66%) Small (1-33%) N/A Necrotic Tissue: Eschar,  Adherent Slough Eschar N/A Exposed Structures: Fascia: No Fascia: No N/A Fat: No Fat: No Tendon: No Tendon: No Muscle: No Muscle: No Joint: No Joint: No Bone: No Bone: No Limited to Skin Limited to Skin Breakdown Breakdown Epithelialization: None Small (1-33%) N/A Periwound Skin Texture: Scarring: Yes Edema: No N/A Excoriation: No Induration: No Callus: No Crepitus: No Fluctuance: No Friable: No Rash: No Scarring: No Periwound Skin Moist: Yes Dry/Scaly: Yes N/A Moisture: Maceration: No Moist: No Periwound Skin Color: Erythema: Yes Atrophie Blanche: No N/A Cyanosis: No Ecchymosis: No Erythema: No Hemosiderin Staining: No Mottled: No Pallor: No Rubor: No Erythema Location: Circumferential N/A N/A Temperature: No Abnormality N/A N/A Tenderness on Yes No N/A Palpation: Wound Preparation: Ulcer Cleansing: Other: Ulcer Cleansing: N/A soap and water Rinsed/Irrigated with Saline Topical Anesthetic Applied: Other: lidocaine 4% Treatment Notes Electronic Signature(s) Todd Davidson, Todd Davidson ([id number]) Signed: 12/08/2015 4:47:59 PM By: Gretta Cool, RN, BSN, Kim RN, BSN Entered By: Gretta Cool, RN, BSN, Kim on 12/08/2015 14:54:56 Todd Davidson ([id number]) -------------------------------------------------------------------------------- Multi-Disciplinary Care Plan Details Patient Name: Todd Davidson Date of Service: 12/08/2015 2:15 PM Medical Record Number: [id number] Patient Account Number: [id number] Date of Birth/Sex: 29-Sep-1935 (80 y.o. Male) Treating RN: Cornell Barman Primary Care Physician: Lamonte Sakai Other Clinician: Referring Physician: Lamonte Sakai Treating Physician/Extender: Tito Dine in Treatment: 2 Active Inactive Abuse / Safety / Falls / Self Care Management Nursing Diagnoses: Potential for falls Goals: Patient will remain injury free Date Initiated: 11/24/2015 Goal Status: Active Interventions: Assess fall risk on admission and as  needed Notes: Nutrition Nursing Diagnoses: Imbalanced nutrition Impaired glucose control: actual or potential Potential for alteratiion in Nutrition/Potential for imbalanced nutrition Goals: Patient/caregiver agrees to and verbalizes understanding of need to use nutritional supplements and/or vitamins as prescribed Date Initiated: 11/24/2015 Goal Status: Active Patient/caregiver verbalizes understanding of need to maintain therapeutic glucose control per primary care physician Date Initiated: 11/24/2015 Goal Status: Active Patient/caregiver will maintain therapeutic glucose control Date Initiated: 11/24/2015 Goal Status: Active Interventions: Assess patient nutrition upon admission and as needed per policy Todd Davidson, Todd Davidson ([id number]) Provide education on elevated blood sugars and impact on wound healing Notes: Orientation to the Wound Care Program Nursing Diagnoses: Knowledge deficit related to the wound healing center program Goals: Patient/caregiver will verbalize understanding of the [facility] Date Initiated: 11/24/2015 Goal Status: Active Interventions: Provide education on orientation to the wound center Notes: Pain, Acute or Chronic Nursing Diagnoses: Pain, acute or chronic: actual or potential Potential alteration in comfort, pain Goals: Patient will verbalize adequate  pain control and receive pain control interventions during procedures as needed Date Initiated: 11/24/2015 Goal Status: Active Patient/caregiver will verbalize adequate pain control between visits Date Initiated: 11/24/2015 Goal Status: Active Patient/caregiver will verbalize comfort level met Date Initiated: 11/24/2015 Goal Status: Active Interventions: Assess comfort goal upon admission Complete pain assessment as per visit requirements Notes: Wound/Skin Impairment Nursing Diagnoses: Impaired tissue integrity Balding, Todd Darnell Level ([id number]) Goals: Ulcer/skin breakdown will have a volume  reduction of 30% by week 4 Date Initiated: 11/24/2015 Goal Status: Active Ulcer/skin breakdown will have a volume reduction of 50% by week 8 Date Initiated: 11/24/2015 Goal Status: Active Ulcer/skin breakdown will have a volume reduction of 80% by week 12 Date Initiated: 11/24/2015 Goal Status: Active Interventions: Assess patient/caregiver ability to perform ulcer/skin care regimen upon admission and as needed Notes: Electronic Signature(s) Signed: 12/08/2015 4:47:59 PM By: Gretta Cool, RN, BSN, Kim RN, BSN Entered By: Gretta Cool, RN, BSN, Kim on 12/08/2015 14:54:48 Todd Davidson ([id number]) -------------------------------------------------------------------------------- Pain Assessment Details Patient Name: Todd Davidson Date of Service: 12/08/2015 2:15 PM Medical Record Number: [id number] Patient Account Number: [id number] Date of Birth/Sex: [birth year] (80 y.o. Male) Treating RN: Cornell Barman Primary Care Physician: Lamonte Sakai Other Clinician: Referring Physician: Lamonte Sakai Treating Physician/Extender: Ricard Dillon Weeks in Treatment: 2 Active Problems Location of Pain Severity and Description of Pain Patient Has Paino No Site Locations With Dressing Change: No Pain Management and Medication Current Pain Management: Notes Topical or injectable lidocaine is offered to patient for acute pain when surgical debridement is performed. If needed, Patient is instructed to use over the counter pain medication for the following 24-48 hours after debridement. Wound care MDs do not prescribed pain medications. Patient has chronic pain or uncontrolled pain. Patient has been instructed to make an appointment with their Primary Care Physician for pain management. Electronic Signature(s) Signed: 12/08/2015 4:47:59 PM By: Gretta Cool, RN, BSN, Kim RN, BSN Entered By: Gretta Cool, RN, BSN, Kim on 12/08/2015 14:31:51 Todd Davidson  ([id number]) -------------------------------------------------------------------------------- Patient/Caregiver Education Details Patient Name: Todd Davidson Date of Service: 12/08/2015 2:15 PM Medical Record Number: [id number] Patient Account Number: [id number] Date of Birth/Gender: 1935/11/08 (80 y.o. Male) Treating RN: Cornell Barman Primary Care Physician: Lamonte Sakai Other Clinician: Referring Physician: Lamonte Sakai Treating Physician/Extender: Tito Dine in Treatment: 2 Education Assessment Education Provided To: Patient Education Topics Provided Venous: Handouts: Controlling Swelling with Compression Stockings Methods: Demonstration Responses: State content correctly Electronic Signature(s) Signed: 12/08/2015 4:47:59 PM By: Gretta Cool, RN, BSN, Kim RN, BSN Entered By: Gretta Cool, RN, BSN, Kim on 12/08/2015 14:57:18 Todd Davidson ([id number]) -------------------------------------------------------------------------------- Wound Assessment Details Patient Name: Todd Davidson Date of Service: 12/08/2015 2:15 PM Medical Record Number: [id number] Patient Account Number: [id number] Date of Birth/Sex: 19-May-1935 (80 y.o. Male) Treating RN: Cornell Barman Primary Care Physician: Lamonte Sakai Other Clinician: Referring Physician: Lamonte Sakai Treating Physician/Extender: Ricard Dillon Weeks in Treatment: 2 Wound Status Wound Number: 1 Primary Diabetic Wound/Ulcer of the Lower Etiology: Extremity Wound Location: Right Lower Leg - Lateral Secondary Trauma, Other Wounding Event: Trauma Etiology: Date Acquired: 10/20/2015 Wound Open Weeks Of Treatment: 2 Status: Clustered Wound: No Comorbid Cataracts, Asthma, Sleep Apnea, History: Coronary Artery Disease, Hypertension, Type II Diabetes, Osteoarthritis, Neuropathy Photos Wound Measurements Length: (cm) 3 Width: (cm) 1.5 Depth: (cm) 0.1 Area: (cm) 3.534 Volume: (cm) 0.353 % Reduction in Area: 53.9% % Reduction in  Volume: 53.9% Epithelialization: None Tunneling: No Undermining: No Wound Description Classification: Grade 1 Wound Margin: Distinct, outline attached Exudate Amount: Large Exudate  Type: Serosanguineous Exudate Color: red, brown Foul Odor After Cleansing: No Wound Bed Granulation Amount: Medium (34-66%) Exposed Structure Granulation Quality: Red Fascia Exposed: No Necrotic Amount: Medium (34-66%) Fat Layer Exposed: No Necrotic Quality: Eschar, Adherent Slough Tendon Exposed: No Todd Davidson, Todd G. ([id number]) Muscle Exposed: No Joint Exposed: No Bone Exposed: No Limited to Skin Breakdown Periwound Skin Texture Texture Color No Abnormalities Noted: No No Abnormalities Noted: No Scarring: Yes Erythema: Yes Erythema Location: Circumferential Moisture No Abnormalities Noted: No Temperature / Pain Moist: Yes Temperature: No Abnormality Tenderness on Palpation: Yes Wound Preparation Ulcer Cleansing: Other: soap and water, Topical Anesthetic Applied: Other: lidocaine 4%, Treatment Notes Wound #1 (Right, Lateral Lower Leg) 1. Cleansed with: Clean wound with Normal Saline 2. Anesthetic Topical Lidocaine 4% cream to wound bed prior to debridement 4. Dressing Applied: Iodoflex 5. Secondary Dressing Applied Bordered Foam Dressing 7. Secured with 2 Layer Compression System - Right Lower Extremity Electronic Signature(s) Signed: 12/08/2015 4:47:59 PM By: Gretta Cool, RN, BSN, Kim RN, BSN Entered By: Gretta Cool, RN, BSN, Kim on 12/08/2015 14:39:38 Todd Davidson ([id number]) -------------------------------------------------------------------------------- Wound Assessment Details Patient Name: Todd Davidson Date of Service: 12/08/2015 2:15 PM Medical Record Number: [id number] Patient Account Number: [id number] Date of Birth/Sex: [birth year] (80 y.o. Male) Treating RN: Cornell Barman Primary Care Physician: Lamonte Sakai Other Clinician: Referring Physician: Lamonte Sakai Treating  Physician/Extender: Ricard Dillon Weeks in Treatment: 2 Wound Status Wound Number: 2 Primary Trauma, Other Etiology: Wound Location: Right Knee Wound Open Wounding Event: Trauma Status: Date Acquired: 12/04/2015 Comorbid Cataracts, Asthma, Sleep Apnea, Weeks Of Treatment: 0 History: Coronary Artery Disease, Hypertension, Clustered Wound: Yes Type II Diabetes, Osteoarthritis, Neuropathy Photos Wound Measurements Length: (cm) 5 Width: (cm) 3.5 Depth: (cm) 0.1 Area: (cm) 13.744 Volume: (cm) 1.374 % Reduction in Area: % Reduction in Volume: Epithelialization: Small (1-33%) Tunneling: No Undermining: No Wound Description Classification: Partial Thickness Diabetic Severity (Wagner): Grade 1 Wound Margin: Flat and Intact Exudate Amount: Small Exudate Type: Serous Exudate Color: amber Foul Odor After Cleansing: No Wound Bed Granulation Amount: Large (67-100%) Exposed Structure Granulation Quality: Red Fascia Exposed: No Necrotic Amount: Small (1-33%) Fat Layer Exposed: No Necrotic Quality: Eschar Tendon Exposed: No Muscle Exposed: No Todd Davidson, Todd G. ([id number]) Joint Exposed: No Bone Exposed: No Limited to Skin Breakdown Periwound Skin Texture Texture Color No Abnormalities Noted: No No Abnormalities Noted: No Callus: No Atrophie Blanche: No Crepitus: No Cyanosis: No Excoriation: No Ecchymosis: No Fluctuance: No Erythema: No Friable: No Hemosiderin Staining: No Induration: No Mottled: No Localized Edema: No Pallor: No Rash: No Rubor: No Scarring: No Moisture No Abnormalities Noted: No Dry / Scaly: Yes Maceration: No Moist: No Wound Preparation Ulcer Cleansing: Rinsed/Irrigated with Saline Electronic Signature(s) Signed: 12/08/2015 4:47:59 PM By: Gretta Cool, RN, BSN, Kim RN, BSN Entered By: Gretta Cool, RN, BSN, Kim on 12/08/2015 14:41:03 Todd Davidson  ([id number]) -------------------------------------------------------------------------------- Vitals Details Patient Name: Todd Davidson Date of Service: 12/08/2015 2:15 PM Medical Record Number: [id number] Patient Account Number: [id number] Date of Birth/Sex: [birth year] (80 y.o. Male) Treating RN: Cornell Barman Primary Care Physician: Lamonte Sakai Other Clinician: Referring Physician: Lamonte Sakai Treating Physician/Extender: Ricard Dillon Weeks in Treatment: 2 Vital Signs Time Taken: 14:31 Temperature (F): 98.5 Height (in): 72 Pulse (bpm): 60 Weight (lbs): 160 Respiratory Rate (breaths/min): 16 Body Mass Index (BMI): 21.7 Blood Pressure (mmHg): 111/55 Reference Range: 80 - 120 mg / dl Electronic Signature(s) Signed: 12/08/2015 4:47:59 PM By: Gretta Cool, RN, BSN, Kim RN, BSN Entered By: Gretta Cool, RN, BSN, Kim on 12/08/2015  14:32:10

## 2015-12-15 ENCOUNTER — Encounter: Payer: Medicare Other | Admitting: Internal Medicine

## 2015-12-15 DIAGNOSIS — I87311 Chronic venous hypertension (idiopathic) with ulcer of right lower extremity: Secondary | ICD-10-CM | POA: Diagnosis not present

## 2015-12-16 ENCOUNTER — Encounter: Payer: Self-pay | Admitting: *Deleted

## 2015-12-16 NOTE — Progress Notes (Signed)
[name], [name] ([id number]) Visit Report for 12/15/2015 Chief Complaint Document Details Patient Name: Todd Davidson, Todd Davidson. Date of Service: 12/15/2015 2:15 PM Medical Record Patient Account Number: [id number] Number: Treating RN: Ahmed Prima [birth year] (79 y.o. Other Clinician: Date of Birth/Sex: Male) Treating ROBSON, [place] Primary Care Physician: Lamonte Sakai Physician/Extender: G Referring Physician: Lamonte Sakai Weeks in Treatment: 3 Information Obtained from: Patient Chief Complaint 11/24/15; this is a patient to is here for a chronic wound on his right lower leg Electronic Signature(s) Signed: 12/16/2015 8:02:21 AM By: Linton Ham MD Entered By: Linton Ham on 12/15/2015 14:39:43 Todd Davidson ([id number]) -------------------------------------------------------------------------------- HPI Details Patient Name: Todd Davidson Date of Service: 12/15/2015 2:15 PM Medical Record Patient Account Number: [id number] Number: Treating RN: Ahmed Prima 05/24/1935 (79 y.o. Other Clinician: Date of Birth/Sex: Male) Treating ROBSON, [place] Primary Care Physician: Lamonte Sakai Physician/Extender: G Referring Physician: Lamonte Sakai Weeks in Treatment: 3 History of Present Illness HPI Description: 11/24/15; this patient is a 80 year old man with type 2 diabetes on insulin. While mowing the lawn on August 1 the patient traumatized his right leg. He was seen in the ER and had 8 sutures. He tells me the sutures were removed all over my understanding the wound at already dehisced. He has been using peroxide and Betadine on the wound. There is a fair amount of pain but no drainage. He probably has diabetic neuropathy. He has no known history of PAD. ABIs in this clinic were 1.02 on the right and 1.11 on the left. 12/01/15; the patient's wound is down 1 cm in width. Surface still required debridement 12/08/15; patient's wound continues to improve although he  continues to require debridement. I have been using Prisma I will to Iodoflex. Patient has 2 superficial open areas over his patella on the right 12/15/15; patient's wound appeared healthier today. I been using Iodoflex for one week. Areas on his right anterior lateral leg. Superficial excoriations on his patella appears stable I have not really done anything particular about this Electronic Signature(s) Signed: 12/16/2015 8:02:21 AM By: Linton Ham MD Entered By: Linton Ham on 12/15/2015 14:43:51 Todd Davidson ([id number]) -------------------------------------------------------------------------------- Physical Exam Details Patient Name: Todd Davidson Date of Service: 12/15/2015 2:15 PM Medical Record Patient Account Number: [id number] Number: Treating RN: Ahmed Prima 07/15/1935 (79 y.o. Other Clinician: Date of Birth/Sex: Male) Treating ROBSON, [place] Primary Care Physician: Lamonte Sakai Physician/Extender: G Referring Physician: Lamonte Sakai Weeks in Treatment: 3 Constitutional Sitting or standing Blood Pressure is within target range for patient.. Pulse regular and within target range for patient.Marland Kitchen Respirations regular, non-labored and within target range.. Temperature is normal and within the target range for the patient.. Patient's appearance is neat and clean. Appears in no acute distress. Well nourished and well developed.. Eyes Conjunctivae clear. No discharge.Marland Kitchen Respiratory Respiratory effort is easy and symmetric bilaterally. Rate is normal at rest and on room air.. Cardiovascular Pedal pulses palpable and strong bilaterally.. Lymphatic Nonpalpable in the popliteal or inguinal area. Psychiatric No evidence of depression, anxiety, or agitation. Calm, cooperative, and communicative. Appropriate interactions and affect.. Notes Wound exam; wound bed appears healthier. No debridement is required. There is rims of epithelialization. Peripheral pulses  are palpable. He does not have edema that is uncontrolled. Electronic Signature(s) Signed: 12/16/2015 8:02:21 AM By: Linton Ham MD Entered By: Linton Ham on 12/15/2015 14:49:01 Todd Davidson ([id number]) -------------------------------------------------------------------------------- Physician Orders Details Patient Name: Todd Davidson Date of Service: 12/15/2015 2:15 PM Medical Record Patient Account Number: [id number]  [id number] Number: Treating RN: Ahmed Prima September 27, 1935 (79 y.o. Other Clinician: Date of Birth/Sex: Male) Treating ROBSON, [place] Primary Care Physician: Lamonte Sakai Physician/Extender: G Referring Physician: Orland Mustard in Treatment: 3 Verbal / Phone Orders: Yes Clinician: Carolyne Fiscal, Debi Read Back and Verified: Yes Diagnosis Coding Wound Cleansing Wound #1 Right,Lateral Lower Leg o Clean wound with Normal Saline. Anesthetic Wound #1 Right,Lateral Lower Leg o Topical Lidocaine 4% cream applied to wound bed prior to debridement Primary Wound Dressing Wound #1 Right,Lateral Lower Leg o Iodoflex Secondary Dressing Wound #1 Right,Lateral Lower Leg o ABD pad o Dry Gauze Dressing Change Frequency Wound #1 Right,Lateral Lower Leg o Change dressing every week Follow-up Appointments Wound #1 Right,Lateral Lower Leg o Return Appointment in 1 week. Edema Control Wound #1 Right,Lateral Lower Leg o 2 Layer Lite Compression System - Right Lower Extremity o Elevate legs to the level of the heart and pump ankles as often as possible Additional Orders / Instructions Wound #1 Right,Lateral Lower Leg o Increase protein intake. [name], [name] ([id number]) Electronic Signature(s) Signed: 12/15/2015 4:46:10 PM By: Alric Quan Signed: 12/16/2015 8:02:21 AM By: Linton Ham MD Entered By: Alric Quan on 12/15/2015 14:35:40 Todd Davidson  ([id number]) -------------------------------------------------------------------------------- Problem List Details Patient Name: Todd Davidson Date of Service: 12/15/2015 2:15 PM Medical Record Patient Account Number: [id number] Number: Treating RN: Ahmed Prima 25-Mar-1935 (79 y.o. Other Clinician: Date of Birth/Sex: Male) Treating ROBSON, [place] Primary Care Physician: Lamonte Sakai Physician/Extender: G Referring Physician: Lamonte Sakai Weeks in Treatment: 3 Active Problems ICD-10 Encounter Code Description Active Date Diagnosis I87.311 Chronic venous hypertension (idiopathic) with ulcer of 11/24/2015 Yes right lower extremity S86.821D Laceration of other muscle(s) and tendon(s) at lower leg 11/24/2015 Yes level, right leg, subsequent encounter Inactive Problems Resolved Problems Electronic Signature(s) Signed: 12/16/2015 8:02:21 AM By: Linton Ham MD Entered By: Linton Ham on 12/15/2015 14:39:20 Todd Davidson ([id number]) -------------------------------------------------------------------------------- Progress Note Details Patient Name: Todd Davidson Date of Service: 12/15/2015 2:15 PM Medical Record Patient Account Number: [id number] Number: Treating RN: Ahmed Prima 1935-12-19 (79 y.o. Other Clinician: Date of Birth/Sex: Male) Treating ROBSON, [place] Primary Care Physician: Lamonte Sakai Physician/Extender: G Referring Physician: Lamonte Sakai Weeks in Treatment: 3 Subjective Chief Complaint Information obtained from Patient 11/24/15; this is a patient to is here for a chronic wound on his right lower leg History of Present Illness (HPI) 11/24/15; this patient is a 80 year old man with type 2 diabetes on insulin. While mowing the lawn on August 1 the patient traumatized his right leg. He was seen in the ER and had 8 sutures. He tells me the sutures were removed all over my understanding the wound at already dehisced. He has been using  peroxide and Betadine on the wound. There is a fair amount of pain but no drainage. He probably has diabetic neuropathy. He has no known history of PAD. ABIs in this clinic were 1.02 on the right and 1.11 on the left. 12/01/15; the patient's wound is down 1 cm in width. Surface still required debridement 12/08/15; patient's wound continues to improve although he continues to require debridement. I have been using Prisma I will to Iodoflex. Patient has 2 superficial open areas over his patella on the right 12/15/15; patient's wound appeared healthier today. I been using Iodoflex for one week. Areas on his right anterior lateral leg. Superficial excoriations on his patella appears stable I have not really done anything particular about this Objective Constitutional Sitting or standing Blood Pressure is within target  range for patient.. Pulse regular and within target range for patient.Marland Kitchen Respirations regular, non-labored and within target range.. Temperature is normal and within the target range for the patient.. Patient's appearance is neat and clean. Appears in no acute distress. Well nourished and well developed.. Vitals Time Taken: 2:15 PM, Height: 72 in, Weight: 160 lbs, BMI: 21.7, Temperature: 98.1 F, Pulse: 52 bpm, Respiratory Rate: 16 breaths/min, Blood Pressure: 114/51 mmHg. General Notes: Made Dr. Dellia Nims aware of pts diastolic BP and HR. Eyes Todd Davidson, Todd G. ([id number]) Conjunctivae clear. No discharge.Marland Kitchen Respiratory Respiratory effort is easy and symmetric bilaterally. Rate is normal at rest and on room air.. Cardiovascular Pedal pulses palpable and strong bilaterally.. Lymphatic Nonpalpable in the popliteal or inguinal area. Psychiatric No evidence of depression, anxiety, or agitation. Calm, cooperative, and communicative. Appropriate interactions and affect.. General Notes: Wound exam; wound bed appears healthier. No debridement is required. There is rims of epithelialization.  Peripheral pulses are palpable. He does not have edema that is uncontrolled. Integumentary (Hair, Skin) Wound #1 status is Open. Original cause of wound was Trauma. The wound is located on the Right,Lateral Lower Leg. The wound measures 3cm length x 1.1cm width x 0.1cm depth; 2.592cm^2 area and 0.259cm^3 volume. The wound is limited to skin breakdown. There is no tunneling or undermining noted. There is a large amount of serosanguineous drainage noted. The wound margin is distinct with the outline attached to the wound base. There is medium (34-66%) red granulation within the wound bed. There is a medium (34-66%) amount of necrotic tissue within the wound bed including Eschar and Adherent Slough. The periwound skin appearance exhibited: Scarring, Moist, Erythema. The surrounding wound skin color is noted with erythema which is circumferential. Periwound temperature was noted as No Abnormality. The periwound has tenderness on palpation. Wound #2 status is Open. Original cause of wound was Trauma. The wound is located on the Right Knee. The wound measures 1cm length x 1.7cm width x 0.1cm depth; 1.335cm^2 area and 0.134cm^3 volume. The wound is limited to skin breakdown. There is no tunneling or undermining noted. There is a small amount of serosanguineous drainage noted. The wound margin is flat and intact. There is large (67-100%) red granulation within the wound bed. There is a small (1-33%) amount of necrotic tissue within the wound bed including Eschar. The periwound skin appearance exhibited: Dry/Scaly. The periwound skin appearance did not exhibit: Callus, Crepitus, Excoriation, Fluctuance, Friable, Induration, Localized Edema, Rash, Scarring, Maceration, Moist, Atrophie Blanche, Cyanosis, Ecchymosis, Hemosiderin Staining, Mottled, Pallor, Rubor, Erythema. Assessment Active Problems ICD-10 I87.311 - Chronic venous hypertension (idiopathic) with ulcer of right lower extremity S86.821D -  Laceration of other muscle(s) and tendon(s) at lower leg level, right leg, subsequent encounter Todd Davidson, Todd Davidson. ([id number]) Plan Wound Cleansing: Wound #1 Right,Lateral Lower Leg: Clean wound with Normal Saline. Anesthetic: Wound #1 Right,Lateral Lower Leg: Topical Lidocaine 4% cream applied to wound bed prior to debridement Primary Wound Dressing: Wound #1 Right,Lateral Lower Leg: Iodoflex Secondary Dressing: Wound #1 Right,Lateral Lower Leg: ABD pad Dry Gauze Dressing Change Frequency: Wound #1 Right,Lateral Lower Leg: Change dressing every week Follow-up Appointments: Wound #1 Right,Lateral Lower Leg: Return Appointment in 1 week. Edema Control: Wound #1 Right,Lateral Lower Leg: 2 Layer Lite Compression System - Right Lower Extremity Elevate legs to the level of the heart and pump ankles as often as possible Additional Orders / Instructions: Wound #1 Right,Lateral Lower Leg: Increase protein intake. Continue iodoflex kerlix and coban if wound bed continues to improve change to hydrofera  next wk Electronic Signature(s) Signed: 12/16/2015 8:02:21 AM By: Linton Ham MD Entered By: Linton Ham on 12/15/2015 14:50:06 Todd Davidson ([name] ([id number]) -------------------------------------------------------------------------------- SuperBill Details Patient Name: Todd Davidson Date of Service: 12/15/2015 Medical Record Patient Account Number: [id number] Number: Treating RN: Ahmed Prima 08/06/1935 (79 y.o. Other Clinician: Date of Birth/Sex: Male) Treating ROBSON, [place] Primary Care Physician: Lamonte Sakai Physician/Extender: G Referring Physician: Lamonte Sakai Weeks in Treatment: 3 Diagnosis Coding ICD-10 Codes Code Description I87.311 Chronic venous hypertension (idiopathic) with ulcer of right lower extremity Laceration of other muscle(s) and tendon(s) at lower leg level, right leg, subsequent S86.821D encounter Facility  Procedures CPT4: Description Modifier Quantity Code [id number] (Facility Use Only) [phone] - APPLY [place] RT 1 LEG Physician Procedures CPT4 Code Description: [id number] [place] - WC PHYS LEVEL 3 - EST PT ICD-10 Description Diagnosis I87.311 Chronic venous hypertension (idiopathic) with ulcer Modifier: of right lower Quantity: 1 extremity Electronic Signature(s) Signed: 12/15/2015 4:46:10 PM By: Alric Quan Signed: 12/16/2015 8:02:21 AM By: Linton Ham MD Entered By: Alric Quan on 12/15/2015 15:16:41

## 2015-12-16 NOTE — Progress Notes (Signed)
[name], [name] ([id number]) Visit Report for 12/15/2015 Arrival Information Details Patient Name: Todd Davidson, Todd Davidson. Date of Service: 12/15/2015 2:15 PM Medical Record Number: [id number] Patient Account Number: [id number] Date of Birth/Sex: July 07, 1935 (80 y.o. Male) Treating RN: Ahmed Prima Primary Care Physician: Lamonte Sakai Other Clinician: Referring Physician: Lamonte Sakai Treating Physician/Extender: Tito Dine in Treatment: 3 Visit Information History Since Last Visit All ordered tests and consults were completed: No Patient Arrived: Kasandra Knudsen Added or deleted any medications: No Arrival Time: 14:12 Any new allergies or adverse reactions: No Accompanied By: self Had a fall or experienced change in No Transfer Assistance: EasyP[employer] activities of daily living that may affect Patient Lift risk of falls: Patient Identification Verified: Yes Signs or symptoms of abuse/neglect since last No Secondary Verification Process Yes visito Completed: Hospitalized since last visit: No Patient Requires Transmission- No Pain Present Now: No Based Precautions: Patient Has Alerts: Yes Patient Alerts: DM II Electronic Signature(s) Signed: 12/15/2015 4:46:10 PM By: Alric Quan Entered By: Alric Quan on 12/15/2015 14:15:31 Todd Davidson ([id number]) -------------------------------------------------------------------------------- Encounter Discharge Information Details Patient Name: Todd Davidson Date of Service: 12/15/2015 2:15 PM Medical Record Number: [id number] Patient Account Number: [id number] Date of Birth/Sex: 09/28/1935 (80 y.o. Male) Treating RN: Ahmed Prima Primary Care Physician: Lamonte Sakai Other Clinician: Referring Physician: Lamonte Sakai Treating Physician/Extender: Tito Dine in Treatment: 3 Encounter Discharge Information Items Discharge Pain Level: 0 Discharge Condition: Stable Ambulatory Status: Cane Discharge Destination:  Home Transportation: Private Auto Accompanied By: self Schedule Follow-up Appointment: Yes Medication Reconciliation completed Yes and provided to Patient/Care Kinsey Cowsert: Provided on Clinical Summary of Care: 12/15/2015 Form Type Recipient Paper Patient LI Electronic Signature(s) Signed: 12/15/2015 2:49:52 PM By: Ruthine Dose Entered By: Ruthine Dose on 12/15/2015 14:49:52 Todd Davidson ([id number]) -------------------------------------------------------------------------------- Lower Extremity Assessment Details Patient Name: Todd Davidson Date of Service: 12/15/2015 2:15 PM Medical Record Number: [id number] Patient Account Number: [id number] Date of Birth/Sex: 01/17/1936 (80 y.o. Male) Treating RN: Ahmed Prima Primary Care Physician: Lamonte Sakai Other Clinician: Referring Physician: Lamonte Sakai Treating Physician/Extender: Ricard Dillon Weeks in Treatment: 3 Edema Assessment Assessed: [Left: No] [Right: No] E[Left: dema] [Right: :] Calf Left: Right: Point of Measurement: 32 cm From Medial Instep cm 26.5 cm Ankle Left: Right: Point of Measurement: 10 cm From Medial Instep cm 19.8 cm Vascular Assessment Pulses: Posterior Tibial Dorsalis Pedis Palpable: [Right:Yes] Extremity colors, hair growth, and conditions: Extremity Color: [Right:Hyperpigmented] Temperature of Extremity: [Right:Warm] Capillary Refill: [Right:< 3 seconds] Toe Nail Assessment Left: Right: Thick: Yes Discolored: Yes Deformed: No Improper Length and Hygiene: No Electronic Signature(s) Signed: 12/15/2015 4:46:10 PM By: Alric Quan Entered By: Alric Quan on 12/15/2015 14:23:35 Todd Davidson ([id number]) -------------------------------------------------------------------------------- Multi Wound Chart Details Patient Name: Todd Davidson Date of Service: 12/15/2015 2:15 PM Medical Record Number: [id number] Patient Account Number: [id number] Date of Birth/Sex: [birth year] (80 y.o.  Male) Treating RN: Ahmed Prima Primary Care Physician: Lamonte Sakai Other Clinician: Referring Physician: Lamonte Sakai Treating Physician/Extender: Ricard Dillon Weeks in Treatment: 3 Vital Signs Height(in): 72 Pulse(bpm): 52 Weight(lbs): 160 Blood Pressure 114/51 (mmHg): Body Mass Index(BMI): 22 Temperature(F): 98.1 Respiratory Rate 16 (breaths/min): Photos: [1:No Photos] [2:No Photos] [N/A:N/A] Wound Location: [1:Right Lower Leg - Lateral Right Knee] [N/A:N/A] Wounding Event: [1:Trauma] [2:Trauma] [N/A:N/A] Primary Etiology: [1:Diabetic Wound/Ulcer of Trauma, Other the Lower Extremity] [N/A:N/A] Secondary Etiology: [1:Trauma, Other] [2:N/A] [N/A:N/A] Comorbid History: [1:Cataracts, Asthma, Sleep Cataracts, Asthma, Sleep N/A Apnea, Coronary Artery Apnea, Coronary Artery Disease, Hypertension, Type II Diabetes, Osteoarthritis,  Neuropathy Osteoarthritis, Neuropathy] [2:Disease, Hypertension, Type II  Diabetes,] Date Acquired: [1:10/20/2015] [2:12/04/2015] [N/A:N/A] Weeks of Treatment: [1:3] [2:1] [N/A:N/A] Wound Status: [1:Open] [2:Open] [N/A:N/A] Clustered Wound: [1:No] [2:Yes] [N/A:N/A] Measurements L x W x D 3x1.1x0.1 [2:1x1.7x0.1] [N/A:N/A] (cm) Area (cm) : [1:2.592] [2:1.335] [N/A:N/A] Volume (cm) : [1:0.259] [2:0.134] [N/A:N/A] % Reduction in Area: [1:66.20%] [2:90.30%] [N/A:N/A] % Reduction in Volume: 66.20% [2:90.20%] [N/A:N/A] Classification: [1:Grade 1] [2:Partial Thickness] [N/A:N/A] HBO Classification: [1:N/A] [2:Grade 1] [N/A:N/A] Exudate Amount: [1:Large] [2:Small] [N/A:N/A] Exudate Type: [1:Serosanguineous] [2:Serosanguineous] [N/A:N/A] Exudate Color: [1:red, brown] [2:red, brown] [N/A:N/A] Wound Margin: [1:Distinct, outline attached Flat and Intact] [N/A:N/A] Granulation Amount: [1:Medium (34-66%)] [2:Large (67-100%)] [N/A:N/A] Granulation Quality: [1:Red] [2:Red] [N/A:N/A] Necrotic Amount: [1:Medium (34-66%)] [2:Small (1-33%)] [N/A:N/A] Necrotic  Tissue: Eschar, Adherent Slough Eschar N/A Exposed Structures: Fascia: No Fascia: No N/A Fat: No Fat: No Tendon: No Tendon: No Muscle: No Muscle: No Joint: No Joint: No Bone: No Bone: No Limited to Skin Limited to Skin Breakdown Breakdown Epithelialization: None Small (1-33%) N/A Periwound Skin Texture: Scarring: Yes Edema: No N/A Excoriation: No Induration: No Callus: No Crepitus: No Fluctuance: No Friable: No Rash: No Scarring: No Periwound Skin Moist: Yes Dry/Scaly: Yes N/A Moisture: Maceration: No Moist: No Periwound Skin Color: Erythema: Yes Atrophie Blanche: No N/A Cyanosis: No Ecchymosis: No Erythema: No Hemosiderin Staining: No Mottled: No Pallor: No Rubor: No Erythema Location: Circumferential N/A N/A Temperature: No Abnormality N/A N/A Tenderness on Yes No N/A Palpation: Wound Preparation: Ulcer Cleansing: Other: Ulcer Cleansing: N/A soap and water Rinsed/Irrigated with Saline Topical Anesthetic Applied: Other: lidocaine 4% Treatment Notes Electronic Signature(s) Signed: 12/15/2015 4:46:10 PM By: Alric Quan Entered By: Alric Quan on 12/15/2015 14:29:27 Todd Davidson ([id number]) -------------------------------------------------------------------------------- [place] Details Patient Name: Todd Davidson Date of Service: 12/15/2015 2:15 PM Medical Record Number: [id number] Patient Account Number: [id number] Date of Birth/Sex: 1936-03-07 (80 y.o. Male) Treating RN: Ahmed Prima Primary Care Physician: Lamonte Sakai Other Clinician: Referring Physician: Lamonte Sakai Treating Physician/Extender: Tito Dine in Treatment: 3 Active Inactive Abuse / Safety / Falls / Self Care Management Nursing Diagnoses: Potential for falls Goals: Patient will remain injury free Date Initiated: 11/24/2015 Goal Status: Active Interventions: Assess fall risk on admission and as needed Notes: Nutrition Nursing  Diagnoses: Imbalanced nutrition Impaired glucose control: actual or potential Potential for alteratiion in Nutrition/Potential for imbalanced nutrition Goals: Patient/caregiver agrees to and verbalizes understanding of need to use nutritional supplements and/or vitamins as prescribed Date Initiated: 11/24/2015 Goal Status: Active Patient/caregiver verbalizes understanding of need to maintain therapeutic glucose control per primary care physician Date Initiated: 11/24/2015 Goal Status: Active Patient/caregiver will maintain therapeutic glucose control Date Initiated: 11/24/2015 Goal Status: Active Interventions: Assess patient nutrition upon admission and as needed per policy FRIEND, DORFMAN ([id number]) Provide education on elevated blood sugars and impact on wound healing Notes: Orientation to the Wound Care Program Nursing Diagnoses: Knowledge deficit related to the wound healing center program Goals: Patient/caregiver will verbalize understanding of the [facility] Date Initiated: 11/24/2015 Goal Status: Active Interventions: Provide education on orientation to the wound center Notes: Pain, Acute or Chronic Nursing Diagnoses: Pain, acute or chronic: actual or potential Potential alteration in comfort, pain Goals: Patient will verbalize adequate pain control and receive pain control interventions during procedures as needed Date Initiated: 11/24/2015 Goal Status: Active Patient/caregiver will verbalize adequate pain control between visits Date Initiated: 11/24/2015 Goal Status: Active Patient/caregiver will verbalize comfort level met Date Initiated: 11/24/2015 Goal Status: Active Interventions: Assess comfort goal upon admission  Complete pain assessment as per visit requirements Notes: Wound/Skin Impairment Nursing Diagnoses: Impaired tissue integrity Antenucci, Brantly G. ([id number]) Goals: Ulcer/skin breakdown will have a volume reduction of 30% by week 4 Date  Initiated: 11/24/2015 Goal Status: Active Ulcer/skin breakdown will have a volume reduction of 50% by week 8 Date Initiated: 11/24/2015 Goal Status: Active Ulcer/skin breakdown will have a volume reduction of 80% by week 12 Date Initiated: 11/24/2015 Goal Status: Active Interventions: Assess patient/caregiver ability to perform ulcer/skin care regimen upon admission and as needed Notes: Electronic Signature(s) Signed: 12/15/2015 4:46:10 PM By: Alric Quan Entered By: Alric Quan on 12/15/2015 14:28:56 Todd Davidson ([id number]) -------------------------------------------------------------------------------- Pain Assessment Details Patient Name: Todd Davidson Date of Service: 12/15/2015 2:15 PM Medical Record Number: [id number] Patient Account Number: [id number] Date of Birth/Sex: 08-Mar-1936 (80 y.o. Male) Treating RN: Ahmed Prima Primary Care Physician: Lamonte Sakai Other Clinician: Referring Physician: Lamonte Sakai Treating Physician/Extender: Ricard Dillon Weeks in Treatment: 3 Active Problems Location of Pain Severity and Description of Pain Patient Has Paino No Site Locations With Dressing Change: No Pain Management and Medication Current Pain Management: Electronic Signature(s) Signed: 12/15/2015 4:46:10 PM By: Alric Quan Entered By: Alric Quan on 12/15/2015 14:15:37 Todd Davidson ([id number]) -------------------------------------------------------------------------------- Patient/Caregiver Education Details Patient Name: Todd Davidson Date of Service: 12/15/2015 2:15 PM Medical Record Number: [id number] Patient Account Number: [id number] Date of Birth/Gender: Apr 15, 1935 (80 y.o. Male) Treating RN: Ahmed Prima Primary Care Physician: Lamonte Sakai Other Clinician: Referring Physician: Lamonte Sakai Treating Physician/Extender: Tito Dine in Treatment: 3 Education Assessment Education Provided To: Patient Education Topics  Provided Wound/Skin Impairment: Handouts: Other: do not get wrap wet Methods: Demonstration, Explain/Verbal Responses: State content correctly Electronic Signature(s) Signed: 12/15/2015 4:46:10 PM By: Alric Quan Entered By: Alric Quan on 12/15/2015 14:36:55 Todd Davidson ([id number]) -------------------------------------------------------------------------------- Wound Assessment Details Patient Name: Todd Davidson Date of Service: 12/15/2015 2:15 PM Medical Record Number: [id number] Patient Account Number: [id number] Date of Birth/Sex: 12/04/1935 (80 y.o. Male) Treating RN: Ahmed Prima Primary Care Physician: Lamonte Sakai Other Clinician: Referring Physician: Lamonte Sakai Treating Physician/Extender: Ricard Dillon Weeks in Treatment: 3 Wound Status Wound Number: 1 Primary Diabetic Wound/Ulcer of the Lower Etiology: Extremity Wound Location: Right Lower Leg - Lateral Secondary Trauma, Other Wounding Event: Trauma Etiology: Date Acquired: 10/20/2015 Wound Open Weeks Of Treatment: 3 Status: Clustered Wound: No Comorbid Cataracts, Asthma, Sleep Apnea, History: Coronary Artery Disease, Hypertension, Type II Diabetes, Osteoarthritis, Neuropathy Photos Photo Uploaded By: Alric Quan on 12/15/2015 16:41:34 Wound Measurements Length: (cm) 3 Width: (cm) 1.1 Depth: (cm) 0.1 Area: (cm) 2.592 Volume: (cm) 0.259 % Reduction in Area: 66.2% % Reduction in Volume: 66.2% Epithelialization: None Tunneling: No Undermining: No Wound Description Classification: Grade 1 Wound Margin: Distinct, outline attached Exudate Amount: Large Exudate Type: Serosanguineous Exudate Color: red, brown Foul Odor After Cleansing: No Wound Bed Granulation Amount: Medium (34-66%) Exposed Structure Mogan, Jomar GMarland Kitchen ([id number]) Granulation Quality: Red Fascia Exposed: No Necrotic Amount: Medium (34-66%) Fat Layer Exposed: No Necrotic Quality: Eschar, Adherent  Slough Tendon Exposed: No Muscle Exposed: No Joint Exposed: No Bone Exposed: No Limited to Skin Breakdown Periwound Skin Texture Texture Color No Abnormalities Noted: No No Abnormalities Noted: No Scarring: Yes Erythema: Yes Erythema Location: Circumferential Moisture No Abnormalities Noted: No Temperature / Pain Moist: Yes Temperature: No Abnormality Tenderness on Palpation: Yes Wound Preparation Ulcer Cleansing: Other: soap and water, Topical Anesthetic Applied: Other: lidocaine 4%, Treatment Notes Wound #1 (Right, Lateral Lower Leg) 1. Cleansed with: Cleanse wound with  antibacterial soap and water 2. Anesthetic Topical Lidocaine 4% cream to wound bed prior to debridement 4. Dressing Applied: Iodoflex 5. Secondary Dressing Applied ABD Pad Dry Gauze 7. Secured with Tape 2 Layer Compression System - Right Lower Extremity Electronic Signature(s) Signed: 12/15/2015 4:46:10 PM By: Alric Quan Entered By: Alric Quan on 12/15/2015 14:27:53 Todd Davidson ([id number]) -------------------------------------------------------------------------------- Wound Assessment Details Patient Name: Todd Davidson Date of Service: 12/15/2015 2:15 PM Medical Record Number: [id number] Patient Account Number: [id number] Date of Birth/Sex: 09-15-1935 (80 y.o. Male) Treating RN: Ahmed Prima Primary Care Physician: Lamonte Sakai Other Clinician: Referring Physician: Lamonte Sakai Treating Physician/Extender: Ricard Dillon Weeks in Treatment: 3 Wound Status Wound Number: 2 Primary Trauma, Other Etiology: Wound Location: Right Knee Wound Open Wounding Event: Trauma Status: Date Acquired: 12/04/2015 Comorbid Cataracts, Asthma, Sleep Apnea, Weeks Of Treatment: 1 History: Coronary Artery Disease, Hypertension, Clustered Wound: Yes Type II Diabetes, Osteoarthritis, Neuropathy Photos Photo Uploaded By: Alric Quan on 12/15/2015 16:41:50 Wound Measurements Length:  (cm) 1 Width: (cm) 1.7 Depth: (cm) 0.1 Area: (cm) 1.335 Volume: (cm) 0.134 % Reduction in Area: 90.3% % Reduction in Volume: 90.2% Epithelialization: Small (1-33%) Tunneling: No Undermining: No Wound Description Classification: Partial Thickness Diabetic Severity (Wagner): Grade 1 Wound Margin: Flat and Intact Exudate Amount: Small Exudate Type: Serosanguineous Exudate Color: red, brown Foul Odor After Cleansing: No Wound Bed Granulation Amount: Large (67-100%) Exposed Structure Granulation Quality: Red Fascia Exposed: No Thull, Dathan G. ([id number]) Necrotic Amount: Small (1-33%) Fat Layer Exposed: No Necrotic Quality: Eschar Tendon Exposed: No Muscle Exposed: No Joint Exposed: No Bone Exposed: No Limited to Skin Breakdown Periwound Skin Texture Texture Color No Abnormalities Noted: No No Abnormalities Noted: No Callus: No Atrophie Blanche: No Crepitus: No Cyanosis: No Excoriation: No Ecchymosis: No Fluctuance: No Erythema: No Friable: No Hemosiderin Staining: No Induration: No Mottled: No Localized Edema: No Pallor: No Rash: No Rubor: No Scarring: No Moisture No Abnormalities Noted: No Dry / Scaly: Yes Maceration: No Moist: No Wound Preparation Ulcer Cleansing: Rinsed/Irrigated with Saline Electronic Signature(s) Signed: 12/15/2015 4:46:10 PM By: Alric Quan Entered By: Alric Quan on 12/15/2015 14:28:43 Todd Davidson ([id number]) -------------------------------------------------------------------------------- Vitals Details Patient Name: Todd Davidson Date of Service: 12/15/2015 2:15 PM Medical Record Number: [id number] Patient Account Number: [id number] Date of Birth/Sex: Apr 15, 1935 (80 y.o. Male) Treating RN: Ahmed Prima Primary Care Physician: Lamonte Sakai Other Clinician: Referring Physician: Lamonte Sakai Treating Physician/Extender: Ricard Dillon Weeks in Treatment: 3 Vital Signs Time Taken: 14:15 Temperature (F):  98.1 Height (in): 72 Pulse (bpm): 52 Weight (lbs): 160 Respiratory Rate (breaths/min): 16 Body Mass Index (BMI): 21.7 Blood Pressure (mmHg): 114/51 Reference Range: 80 - 120 mg / dl Notes Made Dr. Dellia Nims aware of pts diastolic BP and HR. Electronic Signature(s) Signed: 12/15/2015 4:46:10 PM By: Alric Quan Entered By: Alric Quan on 12/15/2015 14:17:48

## 2015-12-17 ENCOUNTER — Ambulatory Visit
Admission: RE | Admit: 2015-12-17 | Discharge: 2015-12-17 | Disposition: A | Discharge status: Home / Self Care | Payer: Medicare Other | Source: Ambulatory Visit | Attending: Gastroenterology | Admitting: Gastroenterology

## 2015-12-17 ENCOUNTER — Encounter
Admission: RE | Disposition: A | Discharge status: Home / Self Care | Payer: Self-pay | Source: Ambulatory Visit | Attending: Gastroenterology

## 2015-12-17 ENCOUNTER — Ambulatory Visit: Payer: Medicare Other | Admitting: Anesthesiology

## 2015-12-17 ENCOUNTER — Encounter: Payer: Self-pay | Admitting: *Deleted

## 2015-12-17 DIAGNOSIS — Z87442 Personal history of urinary calculi: Secondary | ICD-10-CM | POA: Diagnosis not present

## 2015-12-17 DIAGNOSIS — G4730 Sleep apnea, unspecified: Secondary | ICD-10-CM | POA: Diagnosis not present

## 2015-12-17 DIAGNOSIS — K2270 Barrett's esophagus without dysplasia: Secondary | ICD-10-CM | POA: Diagnosis present

## 2015-12-17 DIAGNOSIS — N400 Enlarged prostate without lower urinary tract symptoms: Secondary | ICD-10-CM | POA: Insufficient documentation

## 2015-12-17 DIAGNOSIS — Z7982 Long term (current) use of aspirin: Secondary | ICD-10-CM | POA: Insufficient documentation

## 2015-12-17 DIAGNOSIS — R251 Tremor, unspecified: Secondary | ICD-10-CM | POA: Insufficient documentation

## 2015-12-17 DIAGNOSIS — E7800 Pure hypercholesterolemia, unspecified: Secondary | ICD-10-CM | POA: Diagnosis not present

## 2015-12-17 DIAGNOSIS — E1140 Type 2 diabetes mellitus with diabetic neuropathy, unspecified: Secondary | ICD-10-CM | POA: Insufficient documentation

## 2015-12-17 DIAGNOSIS — I10 Essential (primary) hypertension: Secondary | ICD-10-CM | POA: Diagnosis not present

## 2015-12-17 DIAGNOSIS — J45909 Unspecified asthma, uncomplicated: Secondary | ICD-10-CM | POA: Insufficient documentation

## 2015-12-17 DIAGNOSIS — K2980 Duodenitis without bleeding: Secondary | ICD-10-CM | POA: Insufficient documentation

## 2015-12-17 DIAGNOSIS — N289 Disorder of kidney and ureter, unspecified: Secondary | ICD-10-CM | POA: Insufficient documentation

## 2015-12-17 DIAGNOSIS — Z79899 Other long term (current) drug therapy: Secondary | ICD-10-CM | POA: Diagnosis not present

## 2015-12-17 DIAGNOSIS — M1990 Unspecified osteoarthritis, unspecified site: Secondary | ICD-10-CM | POA: Insufficient documentation

## 2015-12-17 DIAGNOSIS — Z794 Long term (current) use of insulin: Secondary | ICD-10-CM | POA: Diagnosis not present

## 2015-12-17 DIAGNOSIS — I2510 Atherosclerotic heart disease of native coronary artery without angina pectoris: Secondary | ICD-10-CM | POA: Insufficient documentation

## 2015-12-17 DIAGNOSIS — K219 Gastro-esophageal reflux disease without esophagitis: Secondary | ICD-10-CM | POA: Diagnosis not present

## 2015-12-17 DIAGNOSIS — F329 Major depressive disorder, single episode, unspecified: Secondary | ICD-10-CM | POA: Diagnosis not present

## 2015-12-17 HISTORY — PX: ESOPHAGOGASTRODUODENOSCOPY (EGD) WITH PROPOFOL: SHX5813

## 2015-12-17 LAB — GLUCOSE, CAPILLARY: GLUCOSE-CAPILLARY: 81 mg/dL (ref 65–99)

## 2015-12-17 SURGERY — ESOPHAGOGASTRODUODENOSCOPY (EGD) WITH PROPOFOL
Anesthesia: General

## 2015-12-17 MED ORDER — FENTANYL CITRATE (PF) 100 MCG/2ML IJ SOLN
INTRAMUSCULAR | Status: DC | PRN
  Administered 2015-12-17: 50 ug via INTRAVENOUS

## 2015-12-17 MED ORDER — LIDOCAINE 2% (20 MG/ML) 5 ML SYRINGE
INTRAMUSCULAR | Status: DC | PRN
  Administered 2015-12-17: 40 mg via INTRAVENOUS

## 2015-12-17 MED ORDER — SODIUM CHLORIDE 0.9 % IV SOLN
INTRAVENOUS | Status: DC
  Administered 2015-12-17: 07:00:00 via INTRAVENOUS

## 2015-12-17 MED ORDER — MIDAZOLAM HCL 5 MG/5ML IJ SOLN
INTRAMUSCULAR | Status: DC | PRN
  Administered 2015-12-17: 1 mg via INTRAVENOUS

## 2015-12-17 MED ORDER — SODIUM CHLORIDE 0.9 % IV SOLN: INTRAVENOUS | Status: DC

## 2015-12-17 MED ORDER — PROPOFOL 500 MG/50ML IV EMUL
INTRAVENOUS | Status: DC | PRN
  Administered 2015-12-17: 140 ug/kg/min via INTRAVENOUS

## 2015-12-17 MED ORDER — PROPOFOL 10 MG/ML IV BOLUS
INTRAVENOUS | Status: DC | PRN
  Administered 2015-12-17: 100 mg via INTRAVENOUS

## 2015-12-17 NOTE — H&P (Signed)
Outpatient short stay form Pre-procedure 12/17/2015 7:44 AM [name] MD  Primary Physician: Dr Lamonte Sakai  Reason for visit:  EGD  History of present illness:  Patient is a 80 year old male presenting today as above. He has a personal history of Barrett's esophagus his last procedure being done a little over 2 years ago. He does take a 325 mg aspirin daily but is held that for about 5-6 days. He takes no other aspirin products or blood thinning agents.    Current Facility-Administered Medications:  .  0.9 %  sodium chloride infusion, , Intravenous, Continuous, [name], MD, Last Rate: 20 mL/hr at 12/17/15 0721 .  0.9 %  sodium chloride infusion, , Intravenous, Continuous, [name], MD  Prescriptions Prior to Admission  Medication Sig Dispense Refill Last Dose  . amLODipine (NORVASC) 10 MG tablet Take 10 mg by mouth daily. AM   12/16/2015 at Unknown time  . atorvastatin (LIPITOR) 80 MG tablet Take 80 mg by mouth daily. PM   12/16/2015 at Unknown time  . benazepril (LOTENSIN) 20 MG tablet Take 20 mg by mouth 2 (two) times daily. PM   12/16/2015 at Unknown time  . clonazePAM (KLONOPIN) 0.5 MG tablet Take 0.5 mg by mouth at bedtime.   12/16/2015 at Unknown time  . FLUoxetine (PROZAC) 10 MG capsule Take 10 mg by mouth daily.   12/16/2015 at Unknown time  . gabapentin (NEURONTIN) 300 MG capsule Take 300 mg by mouth 2 (two) times daily.   12/17/2015 at 0600  . insulin aspart protamine- aspart (NOVOLOG MIX 70/30) (70-30) 100 UNIT/ML injection Inject 12-28 Units into the skin 2 (two) times daily with a meal. 12 units before breakfast and 28 units before dinner   12/16/2015 at Unknown time  . magnesium oxide (MAG-OX) 400 MG tablet Take 400 mg by mouth daily. PM   12/16/2015 at Unknown time  . mirtazapine (REMERON) 15 MG tablet Take 15 mg by mouth at bedtime.    12/16/2015 at Unknown time  . omeprazole (PRILOSEC) 40 MG capsule Take 40 mg by mouth daily. PM   12/17/2015 at 0600  .  Probiotic Product (PROBIOTIC DAILY PO) Take 1 tablet by mouth at bedtime.   12/16/2015 at Unknown time  . tamsulosin (FLOMAX) 0.4 MG CAPS capsule Take 0.4 mg by mouth daily after breakfast.   12/17/2015 at 0600  . aspirin 325 MG tablet Take 325 mg by mouth daily. AM   12/13/2015  . mupirocin ointment (BACTROBAN) 2 % Apply to affected area 3 times daily 22 g 0      Allergies  Allergen Reactions  . Adhesive [Tape] Other (See Comments)    Heavy tape causes raw skin  . Macrolides And Ketolides Swelling    "-mycin" drugs  . Propranolol   . Sulfa Antibiotics Swelling  . Telbivudine Swelling    "mycin" drugs      Past Medical History:  Diagnosis Date  . Arthritis    neck, shoulders, hips, knees  . Asthma   . Barrett esophagus   . BPH (benign prostatic hyperplasia)   . Coronary artery disease    annual stress with Dr. Humphrey Rolls. no new findings  . Depression   . Diabetes mellitus without complication ([place])   . GERD (gastroesophageal reflux disease)   . Hypercholesteremia   . Hypertension   . Kidney stone   . Neuromuscular disorder ([facility])    neuropathy - feet  . Occasional tremors    hands  . Renal  insufficiency   . Sleep apnea    doesn't wear CPAP  . Wears dentures    full upper    Review of systems:      Physical Exam    Heart and lungs: Regular rate and rhythm without rub or gallop, lungs are bilaterally clear.    HEENT: Norm cephalic atraumatic eyes are anicteric    Other:     Pertinant exam for procedure: Soft nontender nondistended bowel sounds positive normoactive.    Planned proceedures: EGD and indicated procedures. I have discussed the risks benefits and complications of procedures to include not limited to bleeding, infection, perforation and the risk of sedation and the patient wishes to proceed.    [name], MD Gastroenterology 12/17/2015  7:44 AM

## 2015-12-17 NOTE — Op Note (Signed)
[facility] Gastroenterology Patient Name: Todd Davidson Procedure Date: 12/17/2015 7:45 AM MRN: [id number] Account #: [id number] Date of Birth: June 09, 1935 Admit Type: Outpatient Age: 80 Room: [facility] ENDO ROOM 1 Gender: Male Note Status: Finalized Procedure:            Upper GI endoscopy Indications:          Follow-up of Barrett's esophagus Providers:            [name], MD Referring MD:         Perrin Maltese, MD (Referring MD) Medicines:            Monitored Anesthesia Care Complications:        No immediate complications. Procedure:            Pre-Anesthesia Assessment:                       - ASA Grade Assessment: III - A patient with severe                        systemic disease.                       After obtaining informed consent, the endoscope was                        passed under direct vision. Throughout the procedure,                        the patient's blood pressure, pulse, and oxygen                        saturations were monitored continuously. The Endoscope                        was introduced through the mouth, and advanced to the                        third part of duodenum. The upper GI endoscopy was                        accomplished without difficulty. The patient tolerated                        the procedure well. Findings:      There were esophageal mucosal changes consistent with short-segment       Barrett's esophagus present in the lower third of the esophagus. The       maximum longitudinal extent of these mucosal changes was 2 cm in length.       Mucosa was biopsied with a cold forceps for histology in 4 quadrants at       intervals of 2 cm at 39 and 41 cm from the incisors.      The exam of the esophagus was otherwise normal.      The entire examined stomach was normal. Biopsies were taken with a cold       forceps for Helicobacter pylori testing.      Patchy mild inflammation characterized by congestion (edema) and        erythema was found in the duodenal bulb. Impression:           - Esophageal  mucosal changes consistent with                        short-segment Barrett's esophagus. Biopsied.                       - Normal stomach. Biopsied.                       - Duodenitis. Recommendation:       - Continue present medications.                       - Await pathology results.                       - Continue present medications.                       - Discharge patient to home. Procedure Code(s):    --- Professional ---                       [phone], Esophagogastroduodenoscopy, flexible, transoral;                        with biopsy, single or multiple Diagnosis Code(s):    --- Professional ---                       K22.70, Barrett's esophagus without dysplasia                       K29.80, Duodenitis without bleeding CPT copyright 2016 [employer]. All rights reserved. The codes documented in this report are preliminary and upon coder review may  be revised to meet current compliance requirements. [name], MD 12/17/2015 8:08:26 AM This report has been signed electronically. Number of Addenda: 0 Note Initiated On: 12/17/2015 7:45 AM      [facility]

## 2015-12-17 NOTE — Transfer of Care (Signed)
Immediate Anesthesia Transfer of Care Note  Patient: Todd Davidson  Procedure(s) Performed: Procedure(s): ESOPHAGOGASTRODUODENOSCOPY (EGD) WITH PROPOFOL (N/A)  Patient Location: PACU and Endoscopy Unit  Anesthesia Type:General  Level of Consciousness: sedated  Airway & Oxygen Therapy: Patient Spontanous Breathing and Patient connected to nasal cannula oxygen  Post-op Assessment: Report given to RN and Post -op Vital signs reviewed and stable  Post vital signs: Reviewed and stable  Last Vitals:  Vitals:   12/17/15 0658  BP: 113/63  Pulse: (!) 51  Resp: 16  Temp: 36.1 C    Last Pain:  Vitals:   12/17/15 0658  TempSrc: Tympanic         Complications: No apparent anesthesia complications

## 2015-12-17 NOTE — Anesthesia Postprocedure Evaluation (Signed)
Anesthesia Post Note  Patient: Todd Davidson  Procedure(s) Performed: Procedure(s) (LRB): ESOPHAGOGASTRODUODENOSCOPY (EGD) WITH PROPOFOL (N/A)  Patient location during evaluation: Endoscopy Anesthesia Type: General Level of consciousness: awake and alert Pain management: pain level controlled Vital Signs Assessment: post-procedure vital signs reviewed and stable Respiratory status: spontaneous breathing, nonlabored ventilation, respiratory function stable and patient connected to nasal cannula oxygen Cardiovascular status: blood pressure returned to baseline and stable Postop Assessment: no signs of nausea or vomiting Anesthetic complications: no    Last Vitals:  Vitals:   12/17/15 0830 12/17/15 0840  BP: 104/65 105/62  Pulse:  (!) 46  Resp:  17  Temp:      Last Pain:  Vitals:   12/17/15 0810  TempSrc: Tympanic                 Martha Clan

## 2015-12-17 NOTE — Anesthesia Preprocedure Evaluation (Signed)
Anesthesia Evaluation  Patient identified by MRN, date of birth, ID band Patient awake    Reviewed: Allergy & Precautions, H&P , NPO status , Patient's Chart, lab work & pertinent test results, reviewed documented beta blocker date and time   History of Anesthesia Complications Negative for: history of anesthetic complications  Airway Mallampati: I  TM Distance: >3 FB Neck ROM: full    Dental  (+) Edentulous Upper, Upper Dentures   Pulmonary neg pulmonary ROS, neg shortness of breath, asthma , sleep apnea , neg COPD, neg recent URI, former smoker,    Pulmonary exam normal breath sounds clear to auscultation       Cardiovascular Exercise Tolerance: Good hypertension, (-) angina+ CAD and + Cardiac Stents  (-) Past MI and (-) CABG Normal cardiovascular exam(-) dysrhythmias + Valvular Problems/Murmurs  Rhythm:regular Rate:Normal     Neuro/Psych neg Seizures PSYCHIATRIC DISORDERS (Depression)  Neuromuscular disease    GI/Hepatic Neg liver ROS, GERD  ,  Endo/Other  diabetes, Insulin Dependent  Renal/GU CRFRenal disease (kidney stones)  negative genitourinary   Musculoskeletal   Abdominal   Peds  Hematology negative hematology ROS (+)   Anesthesia Other Findings Past Medical History: No date: Arthritis     Comment: neck, shoulders, hips, knees No date: Asthma No date: Barrett esophagus No date: BPH (benign prostatic hyperplasia) No date: Coronary artery disease     Comment: annual stress with Dr. Humphrey Rolls. no new findings No date: Depression No date: Diabetes mellitus without complication (HCC) No date: GERD (gastroesophageal reflux disease) No date: Hypercholesteremia No date: Hypertension No date: Kidney stone No date: Neuromuscular disorder ([place])     Comment: neuropathy - feet No date: Occasional tremors     Comment: hands No date: Renal insufficiency No date: Sleep apnea     Comment: doesn't wear CPAP No date:  Wears dentures     Comment: full upper   Reproductive/Obstetrics negative OB ROS                             Anesthesia Physical Anesthesia Plan  ASA: III  Anesthesia Plan: General   Post-op Pain Management:    Induction:   Airway Management Planned:   Additional Equipment:   Intra-op Plan:   Post-operative Plan:   Informed Consent: I have reviewed the patients History and Physical, chart, labs and discussed the procedure including the risks, benefits and alternatives for the proposed anesthesia with the patient or authorized representative who has indicated his/her understanding and acceptance.   Dental Advisory Given  Plan Discussed with: Anesthesiologist, CRNA and Surgeon  Anesthesia Plan Comments:         Anesthesia Quick Evaluation

## 2015-12-18 ENCOUNTER — Encounter: Payer: Self-pay | Admitting: Gastroenterology

## 2015-12-19 LAB — SURGICAL PATHOLOGY

## 2015-12-22 ENCOUNTER — Encounter: Payer: Medicare Other | Attending: Physician Assistant | Admitting: Physician Assistant

## 2015-12-22 DIAGNOSIS — N400 Enlarged prostate without lower urinary tract symptoms: Secondary | ICD-10-CM | POA: Diagnosis not present

## 2015-12-22 DIAGNOSIS — Z87891 Personal history of nicotine dependence: Secondary | ICD-10-CM | POA: Insufficient documentation

## 2015-12-22 DIAGNOSIS — I2510 Atherosclerotic heart disease of native coronary artery without angina pectoris: Secondary | ICD-10-CM | POA: Diagnosis not present

## 2015-12-22 DIAGNOSIS — X58XXXD Exposure to other specified factors, subsequent encounter: Secondary | ICD-10-CM | POA: Diagnosis not present

## 2015-12-22 DIAGNOSIS — Z794 Long term (current) use of insulin: Secondary | ICD-10-CM | POA: Insufficient documentation

## 2015-12-22 DIAGNOSIS — J45909 Unspecified asthma, uncomplicated: Secondary | ICD-10-CM | POA: Insufficient documentation

## 2015-12-22 DIAGNOSIS — M1990 Unspecified osteoarthritis, unspecified site: Secondary | ICD-10-CM | POA: Insufficient documentation

## 2015-12-22 DIAGNOSIS — I10 Essential (primary) hypertension: Secondary | ICD-10-CM | POA: Diagnosis not present

## 2015-12-22 DIAGNOSIS — E1140 Type 2 diabetes mellitus with diabetic neuropathy, unspecified: Secondary | ICD-10-CM | POA: Insufficient documentation

## 2015-12-22 DIAGNOSIS — G4730 Sleep apnea, unspecified: Secondary | ICD-10-CM | POA: Insufficient documentation

## 2015-12-22 DIAGNOSIS — I87311 Chronic venous hypertension (idiopathic) with ulcer of right lower extremity: Secondary | ICD-10-CM | POA: Insufficient documentation

## 2015-12-22 DIAGNOSIS — S86821D Laceration of other muscle(s) and tendon(s) at lower leg level, right leg, subsequent encounter: Secondary | ICD-10-CM | POA: Diagnosis not present

## 2015-12-25 NOTE — Progress Notes (Addendum)
ESAW, DIEL ([id number]) Visit Report for 12/22/2015 Chief Complaint Document Details Patient Name: Todd Davidson, Todd Davidson. Date of Service: 12/22/2015 12:45 PM Medical Record Number: [id number] Patient Account Number: [id number] Date of Birth/Sex: [birth year] (80 y.o. Male) Treating RN: Cornell Barman Primary Care Physician: Lamonte Sakai Other Clinician: Referring Physician: Lamonte Sakai Treating Physician/Extender: Melburn Hake, Woodie Degraffenreid Weeks in Treatment: 4 Information Obtained from: Patient Chief Complaint Patient is here for a chronic wound on his right lower leg Electronic Signature(s) Signed: 12/24/2015 1:37:32 AM By: Worthy Keeler PA-C Entered By: Worthy Keeler on 12/22/2015 14:36:58 Todd Davidson ([id number]) -------------------------------------------------------------------------------- Debridement Details Patient Name: Todd Davidson Date of Service: 12/22/2015 12:45 PM Medical Record Number: [id number] Patient Account Number: [id number] Date of Birth/Sex: [birth year] (80 y.o. Male) Treating RN: Cornell Barman Primary Care Physician: Lamonte Sakai Other Clinician: Referring Physician: Lamonte Sakai Treating Physician/Extender: Melburn Hake, Lorry Furber Weeks in Treatment: 4 Debridement Performed for Wound #1 Right,Lateral Lower Leg Assessment: Performed By: Physician STONE III, Christinna Sprung, Debridement: Debridement Pre-procedure Yes - 13:40 Verification/Time Out Taken: Start Time: 13:41 Pain Control: Other : lidocaine 4% Level: Skin/Subcutaneous Tissue Total Area Debrided (L x 1.2 (cm) x 0.6 (cm) = 0.72 (cm) W): Tissue and other Viable, Non-Viable, Fibrin/Slough, Subcutaneous material debrided: Instrument: Curette Bleeding: Minimum Hemostasis Achieved: Pressure End Time: 13:45 Procedural Pain: 0 Post Procedural Pain: 0 Response to Treatment: Procedure was tolerated well Post Debridement Measurements of Total Wound Length: (cm) 1.2 Width: (cm) 0.6 Depth: (cm) 0.1 Volume: (cm)  0.057 Character of Wound/Ulcer Post Requires Further Debridement Debridement: Severity of Tissue Post Debridement: Fat layer exposed Post Procedure Diagnosis Same as Pre-procedure Electronic Signature(s) Signed: 12/24/2015 1:37:32 AM By: Worthy Keeler PA-C Signed: 12/24/2015 4:28:52 PM By: Gretta Cool, RN, BSN, Kim RN, BSN Entered By: Gretta Cool, RN, BSN, Kim on 12/22/2015 13:45:20 Todd Davidson ([name] ([id number]) -------------------------------------------------------------------------------- HPI Details Patient Name: Todd Davidson Date of Service: 12/22/2015 12:45 PM Medical Record Number: [id number] Patient Account Number: [id number] Date of Birth/Sex: 1935-11-02 (80 y.o. Male) Treating RN: Cornell Barman Primary Care Physician: Lamonte Sakai Other Clinician: Referring Physician: Lamonte Sakai Treating Physician/Extender: Melburn Hake, Styles Fambro Weeks in Treatment: 4 History of Present Illness HPI Description: 11/24/15; this patient is a 80 year old man with type 2 diabetes on insulin. While mowing the lawn on August 1 the patient traumatized his right leg. He was seen in the ER and had 8 sutures. He tells me the sutures were removed all over my understanding the wound at already dehisced. He has been using peroxide and Betadine on the wound. There is a fair amount of pain but no drainage. He probably has diabetic neuropathy. He has no known history of PAD. ABIs in this clinic were 1.02 on the right and 1.11 on the left. 12/01/15; the patient's wound is down 1 cm in width. Surface still required debridement 12/08/15; patient's wound continues to improve although he continues to require debridement. I have been using Prisma I will to Iodoflex. Patient has 2 superficial open areas over his patella on the right 12/15/15; patient's wound appeared healthier today. I been using Iodoflex for one week. Areas on his right anterior lateral leg. Superficial excoriations on his patella appears stable  I have not really done anything particular about this 12/22/15 patient presents today for a follow-up visit concerning his ongoing right lower extremity open wound. We have been using Iodoflex which she is tolerating well at this point. Subsequently he has also been tolerating the 2 layer compression wrapping  that we have recommended. Subsequently his right knee where he had some superficial excoriations does seem to be healing this is scabbed over at this point in time and shows no sign of infection which is excellent news. He tells me that he really is not having any significant pain in fact he rates his be a 0 out of 10 at this point in time. Electronic Signature(s) Signed: 12/24/2015 1:37:32 AM By: Worthy Keeler PA-C Entered By: Worthy Keeler on 12/22/2015 14:38:08 Todd Davidson ([id number]) -------------------------------------------------------------------------------- Physical Exam Details Patient Name: Todd Davidson Date of Service: 12/22/2015 12:45 PM Medical Record Number: [id number] Patient Account Number: [id number] Date of Birth/Sex: 1935/11/05 (80 y.o. Male) Treating RN: Cornell Barman Primary Care Physician: Lamonte Sakai Other Clinician: Referring Physician: Lamonte Sakai Treating Physician/Extender: Melburn Hake, Harla Mensch Weeks in Treatment: 4 Constitutional Well-nourished and well-hydrated in no acute distress. Respiratory normal breathing without difficulty. Psychiatric this patient is able to make decisions and demonstrates good insight into disease process. Alert and Oriented x 3. pleasant and cooperative. Electronic Signature(s) Signed: 12/24/2015 1:37:32 AM By: Worthy Keeler PA-C Entered By: Worthy Keeler on 12/22/2015 14:38:36 Todd Davidson ([id number]) -------------------------------------------------------------------------------- Physician Orders Details Patient Name: Todd Davidson Date of Service: 12/22/2015 12:45 PM Medical Record Number: [id number] Patient  Account Number: [id number] Date of Birth/Sex: May 05, 1935 (80 y.o. Male) Treating RN: Cornell Barman Primary Care Physician: Lamonte Sakai Other Clinician: Referring Physician: Lamonte Sakai Treating Physician/Extender: Melburn Hake, Dione Petron Weeks in Treatment: 4 Verbal / Phone Orders: Yes Clinician: Cornell Barman Read Back and Verified: Yes Diagnosis Coding Wound Cleansing Wound #1 Right,Lateral Lower Leg o Clean wound with Normal Saline. Anesthetic Wound #1 Right,Lateral Lower Leg o Topical Lidocaine 4% cream applied to wound bed prior to debridement Primary Wound Dressing Wound #1 Right,Lateral Lower Leg o Iodoflex Secondary Dressing Wound #1 Right,Lateral Lower Leg o ABD pad o Dry Gauze Dressing Change Frequency Wound #1 Right,Lateral Lower Leg o Change dressing every week Follow-up Appointments Wound #1 Right,Lateral Lower Leg o Return Appointment in 1 week. Edema Control Wound #1 Right,Lateral Lower Leg o 2 Layer Lite Compression System - Right Lower Extremity o Elevate legs to the level of the heart and pump ankles as often as possible Additional Orders / Instructions Wound #1 Right,Lateral Lower Leg o Increase protein intake. NERIAH, SCHACHER ([id number]) Electronic Signature(s) Signed: 12/24/2015 1:37:32 AM By: Worthy Keeler PA-C Signed: 12/24/2015 4:28:52 PM By: Gretta Cool RN, BSN, Kim RN, BSN Entered By: Gretta Cool, RN, BSN, Kim on 12/22/2015 13:46:00 Todd Davidson ([id number]) -------------------------------------------------------------------------------- Problem List Details Patient Name: Todd Davidson Date of Service: 12/22/2015 12:45 PM Medical Record Number: [id number] Patient Account Number: [id number] Date of Birth/Sex: June 26, 1935 (80 y.o. Male) Treating RN: Cornell Barman Primary Care Physician: Lamonte Sakai Other Clinician: Referring Physician: Lamonte Sakai Treating Physician/Extender: Melburn Hake, Levone Otten Weeks in Treatment: 4 Active  Problems ICD-10 Encounter Code Description Active Date Diagnosis I87.311 Chronic venous hypertension (idiopathic) with ulcer of 11/24/2015 Yes right lower extremity S86.821D Laceration of other muscle(s) and tendon(s) at lower leg 11/24/2015 Yes level, right leg, subsequent encounter Inactive Problems Resolved Problems Electronic Signature(s) Signed: 12/24/2015 1:37:32 AM By: Worthy Keeler PA-C Entered By: Worthy Keeler on 12/22/2015 14:34:58 Todd Davidson ([id number]) -------------------------------------------------------------------------------- Progress Note Details Patient Name: Todd Davidson Date of Service: 12/22/2015 12:45 PM Medical Record Number: [id number] Patient Account Number: [id number] Date of Birth/Sex: [birth year] (80 y.o. Male) Treating RN: Cornell Barman Primary Care Physician: Lamonte Sakai Other Clinician: Referring  Physician: Lamonte Sakai Treating Physician/Extender: Melburn Hake, Joany Khatib Weeks in Treatment: 4 Subjective Chief Complaint Information obtained from Patient Patient is here for a chronic wound on his right lower leg History of Present Illness (HPI) 11/24/15; this patient is a 80 year old man with type 2 diabetes on insulin. While mowing the lawn on August 1 the patient traumatized his right leg. He was seen in the ER and had 8 sutures. He tells me the sutures were removed all over my understanding the wound at already dehisced. He has been using peroxide and Betadine on the wound. There is a fair amount of pain but no drainage. He probably has diabetic neuropathy. He has no known history of PAD. ABIs in this clinic were 1.02 on the right and 1.11 on the left. 12/01/15; the patient's wound is down 1 cm in width. Surface still required debridement 12/08/15; patient's wound continues to improve although he continues to require debridement. I have been using Prisma I will to Iodoflex. Patient has 2 superficial open areas over his patella on the right 12/15/15;  patient's wound appeared healthier today. I been using Iodoflex for one week. Areas on his right anterior lateral leg. Superficial excoriations on his patella appears stable I have not really done anything particular about this 12/22/15 patient presents today for a follow-up visit concerning his ongoing right lower extremity open wound. We have been using Iodoflex which she is tolerating well at this point. Subsequently he has also been tolerating the 2 layer compression wrapping that we have recommended. Subsequently his right knee where he had some superficial excoriations does seem to be healing this is scabbed over at this point in time and shows no sign of infection which is excellent news. He tells me that he really is not having any significant pain in fact he rates his be a 0 out of 10 at this point in time. Objective Constitutional Well-nourished and well-hydrated in no acute distress. Vitals Time Taken: 12:58 PM, Height: 72 in, Weight: 160 lbs, BMI: 21.7, Temperature: 98.3 F, Pulse: 56 bpm, Respiratory Rate: 16 breaths/min, Blood Pressure: 116/45 mmHg. TYRES, WIERZBA ([id number]) Respiratory normal breathing without difficulty. Psychiatric this patient is able to make decisions and demonstrates good insight into disease process. Alert and Oriented x 3. pleasant and cooperative. Integumentary (Hair, Skin) Wound #1 status is Open. Original cause of wound was Trauma. The wound is located on the Right,Lateral Lower Leg. The wound measures 1.2cm length x 0.6cm width x 0.1cm depth; 0.565cm^2 area and 0.057cm^3 volume. The wound is limited to skin breakdown. There is no tunneling or undermining noted. There is a large amount of serosanguineous drainage noted. The wound margin is distinct with the outline attached to the wound base. There is medium (34-66%) red granulation within the wound bed. There is a medium (34-66%) amount of necrotic tissue within the wound bed including Eschar and  Adherent Slough. The periwound skin appearance exhibited: Scarring, Moist, Erythema. The surrounding wound skin color is noted with erythema which is circumferential. Periwound temperature was noted as No Abnormality. The periwound has tenderness on palpation. Wound #2 status is Open. Original cause of wound was Trauma. The wound is located on the Right Knee. The wound measures 0.8cm length x 1.2cm width x 0.1cm depth; 0.754cm^2 area and 0.075cm^3 volume. The wound is limited to skin breakdown. There is no tunneling or undermining noted. There is a small amount of serosanguineous drainage noted. The wound margin is flat and intact. There is no granulation within the  wound bed. There is a large (67-100%) amount of necrotic tissue within the wound bed including Eschar. The periwound skin appearance exhibited: Dry/Scaly. The periwound skin appearance did not exhibit: Callus, Crepitus, Excoriation, Fluctuance, Friable, Induration, Localized Edema, Rash, Scarring, Maceration, Moist, Atrophie Blanche, Cyanosis, Ecchymosis, Hemosiderin Staining, Mottled, Pallor, Rubor, Erythema. Assessment Active Problems ICD-10 I87.311 - Chronic venous hypertension (idiopathic) with ulcer of right lower extremity S86.821D - Laceration of other muscle(s) and tendon(s) at lower leg level, right leg, subsequent encounter Diagnoses ICD-10 I87.311: Chronic venous hypertension (idiopathic) with ulcer of right lower extremity S86.821D: Laceration of other muscle(s) and tendon(s) at lower leg level, right leg, subsequent encounter JAEGER, ARNESON. ([id number]) Procedures Wound #1 Wound #1 is a Diabetic Wound/Ulcer of the Lower Extremity located on the Right,Lateral Lower Leg . There was a Skin/Subcutaneous Tissue Debridement [id number]) debridement with total area of 0.72 sq cm performed by STONE III, Jissel Slavens. with the following instrument(s): Curette to remove Viable and Non-Viable tissue/material including Fibrin/Slough  and Subcutaneous after achieving pain control using Other (lidocaine 4%). A time out was conducted at 13:40, prior to the start of the procedure. A Minimum amount of bleeding was controlled with Pressure. The procedure was tolerated well with a pain level of 0 throughout and a pain level of 0 following the procedure. Post Debridement Measurements: 1.2cm length x 0.6cm width x 0.1cm depth; 0.057cm^3 volume. Character of Wound/Ulcer Post Debridement requires further debridement. Severity of Tissue Post Debridement is: Fat layer exposed. Post procedure Diagnosis Wound #1: Same as Pre-Procedure Plan Wound Cleansing: Wound #1 Right,Lateral Lower Leg: Clean wound with Normal Saline. Anesthetic: Wound #1 Right,Lateral Lower Leg: Topical Lidocaine 4% cream applied to wound bed prior to debridement Primary Wound Dressing: Wound #1 Right,Lateral Lower Leg: Iodoflex Secondary Dressing: Wound #1 Right,Lateral Lower Leg: ABD pad Dry Gauze Dressing Change Frequency: Wound #1 Right,Lateral Lower Leg: Change dressing every week Follow-up Appointments: Wound #1 Right,Lateral Lower Leg: Return Appointment in 1 week. Edema Control: Wound #1 Right,Lateral Lower Leg: 2 Layer Lite Compression System - Right Lower Extremity Elevate legs to the level of the heart and pump ankles as often as possible Additional Orders / Instructions: Wound #1 Right,Lateral Lower Leg: Increase protein intake. BRAEDEN, ADDICKS ([id number]) Follow-Up Appointments: A follow-up appointment should be scheduled. Medication Reconciliation completed and provided to Patient/Care Provider. At this point in time there was some slough covering the wound on evaluation today. Therefore I did perform sharp debridement which patient tolerated well with good results. We are going to continue with the Iodoflex at this point in time as well as a 2 layer compression wrapping. All questions and concerns were answered to the best of my  ability today. The main concern he had was how long it would take for this to heal. I explained that there is not a definitive timeframe that I can give him but that the good news is we are making progress towards that end. I will see him back for follow-up visit in one week. Electronic Signature(s) Signed: 01/13/2016 5:10:12 PM By: Worthy Keeler PA-C Previous Signature: 12/24/2015 1:37:32 AM Version By: Worthy Keeler PA-C Entered By: Worthy Keeler on 01/13/2016 17:10:12 Todd Davidson ([id number]) -------------------------------------------------------------------------------- SuperBill Details Patient Name: Todd Davidson Date of Service: 12/22/2015 Medical Record Number: [id number] Patient Account Number: [id number] Date of Birth/Sex: 06/04/1935 (80 y.o. Male) Treating RN: Cornell Barman Primary Care Physician: Lamonte Sakai Other Clinician: Referring Physician: Lamonte Sakai Treating Physician/Extender: Melburn Hake, Patterson Hollenbaugh Weeks in Treatment: 4  Diagnosis Coding ICD-10 Codes Code Description I87.311 Chronic venous hypertension (idiopathic) with ulcer of right lower extremity Laceration of other muscle(s) and tendon(s) at lower leg level, right leg, subsequent S86.821D encounter Facility Procedures CPT4: Description Modifier Quantity Code [id number] - DEB SUBQ TISSUE 20 SQ CM/< 1 ICD-10 Description Diagnosis S86.821D Laceration of other muscle(s) and tendon(s) at lower leg level, right leg, subsequent encounter I87.311 Chronic venous  hypertension (idiopathic) with ulcer of right lower extremity Physician Procedures CPT4: Description Modifier Quantity Code [id number] - WC PHYS SUBQ TISS 20 SQ CM 1 ICD-10 Description Diagnosis S86.821D Laceration of other muscle(s) and tendon(s) at lower leg level, right leg, subsequent encounter I87.311 Chronic venous  hypertension (idiopathic) with ulcer of right lower extremity Electronic Signature(s) Signed: 12/24/2015 1:37:32 AM By: Worthy Keeler PA-C Entered By: Worthy Keeler on 12/22/2015 14:41:06

## 2015-12-25 NOTE — Progress Notes (Signed)
SIMONE, TUCKEY ([id number]) Visit Report for 12/22/2015 Arrival Information Details Patient Name: Todd Davidson, Todd Davidson. Date of Service: 12/22/2015 12:45 PM Medical Record Number: [id number] Patient Account Number: [id number] Date of Birth/Sex: 02-Aug-1935 (80 y.o. Male) Treating RN: Cornell Barman Primary Care Physician: Lamonte Sakai Other Clinician: Referring Physician: Kasandra Knudsen Treating Physician/Extender: Melburn Hake, HOYT Weeks in Treatment: 4 Visit Information History Since Last Visit Added or deleted any medications: No Patient Arrived: Kasandra Knudsen Any new allergies or adverse reactions: No Arrival Time: 12:58 Had a fall or experienced change in No Accompanied By: self activities of daily living that may affect Transfer Assistance: None risk of falls: Patient Identification Verified: Yes Signs or symptoms of abuse/neglect since last No Secondary Verification Process Completed: Yes visito Patient Requires Transmission-Based No Hospitalized since last visit: No Precautions: Has Dressing in Place as Prescribed: Yes Patient Has Alerts: Yes Has Compression in Place as Prescribed: Yes Patient Alerts: DM II Pain Present Now: No Electronic Signature(s) Signed: 12/24/2015 4:28:52 PM By: Gretta Cool, RN, BSN, Kim RN, BSN Entered By: Gretta Cool, RN, BSN, Kim on 12/22/2015 12:58:37 Todd Davidson ([id number]) -------------------------------------------------------------------------------- Encounter Discharge Information Details Patient Name: Todd Davidson Date of Service: 12/22/2015 12:45 PM Medical Record Number: [id number] Patient Account Number: [id number] Date of Birth/Sex: 1935/07/26 (80 y.o. Male) Treating RN: Cornell Barman Primary Care Physician: Lamonte Sakai Other Clinician: Referring Physician: Kasandra Knudsen Treating Physician/Extender: Melburn Hake, HOYT Weeks in Treatment: 4 Encounter Discharge Information Items Discharge Pain Level: 0 Discharge Condition: Stable Ambulatory Status:  Ambulatory Discharge Destination: Home Private Transportation: Auto Accompanied By: self Schedule Follow-up Appointment: Yes Medication Reconciliation completed and Yes provided to Patient/Care Rayola Everhart: Clinical Summary of Care: Electronic Signature(s) Signed: 12/24/2015 4:28:52 PM By: Gretta Cool RN, BSN, Kim RN, BSN Previous Signature: 12/22/2015 1:53:53 PM Version By: Ruthine Dose Entered By: Gretta Cool RN, BSN, Kim on 12/22/2015 13:54:07 Todd Davidson ([id number]) -------------------------------------------------------------------------------- Lower Extremity Assessment Details Patient Name: Todd Davidson Date of Service: 12/22/2015 12:45 PM Medical Record Number: [id number] Patient Account Number: [id number] Date of Birth/Sex: [birth year] (80 y.o. Male) Treating RN: Cornell Barman Primary Care Physician: Lamonte Sakai Other Clinician: Referring Physician: Kasandra Knudsen Treating Physician/Extender: Melburn Hake, HOYT Weeks in Treatment: 4 Edema Assessment Assessed: [Left: No] [Right: No] E[Left: dema] [Right: :] Calf Left: Right: Point of Measurement: 32 cm From Medial Instep cm 27.4 cm Ankle Left: Right: Point of Measurement: 10 cm From Medial Instep cm 20 cm Vascular Assessment Pulses: Posterior Tibial Dorsalis Pedis Doppler: [Right:Multiphasic] Extremity colors, hair growth, and conditions: Extremity Color: [Right:Hyperpigmented] Hair Growth on Extremity: [Right:Yes] Temperature of Extremity: [Right:Warm] Capillary Refill: [Right:< 3 seconds] Dependent Rubor: [Right:No] Blanched when Elevated: [Right:No] Lipodermatosclerosis: [Right:No] Toe Nail Assessment Left: Right: Thick: Yes Discolored: Yes Deformed: No Improper Length and Hygiene: Yes Electronic Signature(s) Signed: 12/24/2015 4:28:52 PM By: Gretta Cool, RN, BSN, Kim RN, BSN Schachter, Lawernce Keas ([id number]) Entered By: Gretta Cool, RN, BSN, Kim on 12/22/2015 13:03:51 Todd Davidson  ([id number]) -------------------------------------------------------------------------------- Multi Wound Chart Details Patient Name: Todd Davidson Date of Service: 12/22/2015 12:45 PM Medical Record Number: [id number] Patient Account Number: [id number] Date of Birth/Sex: [birth year] (80 y.o. Male) Treating RN: Cornell Barman Primary Care Physician: Lamonte Sakai Other Clinician: Referring Physician: Kasandra Knudsen Treating Physician/Extender: Melburn Hake, HOYT Weeks in Treatment: 4 Vital Signs Height(in): 72 Pulse(bpm): 56 Weight(lbs): 160 Blood Pressure 116/45 (mmHg): Body Mass Index(BMI): 22 Temperature(F): 98.3 Respiratory Rate 16 (breaths/min): Photos: [N/A:N/A] Wound Location: Right Lower Leg - Lateral Right Knee N/A Wounding Event: Trauma Trauma N/A Primary Etiology: Diabetic  Wound/Ulcer of Trauma, Other N/A the Lower Extremity Secondary Etiology: Trauma, Other N/A N/A Comorbid History: Cataracts, Asthma, Sleep Cataracts, Asthma, Sleep N/A Apnea, Coronary Artery Apnea, Coronary Artery Disease, Hypertension, Disease, Hypertension, Type II Diabetes, Type II Diabetes, Osteoarthritis, Neuropathy Osteoarthritis, Neuropathy Date Acquired: 10/20/2015 12/04/2015 N/A Weeks of Treatment: 4 2 N/A Wound Status: Open Open N/A Clustered Wound: No Yes N/A Measurements L x W x D 1.2x0.6x0.1 0.8x1.2x0.1 N/A (cm) Area (cm) : 0.565 0.754 N/A Volume (cm) : 0.057 0.075 N/A % Reduction in Area: 92.60% 94.50% N/A % Reduction in Volume: 92.60% 94.50% N/A Classification: Grade 1 Partial Thickness N/A HBO Classification: N/A Grade 1 N/A Exudate Amount: Large Small N/A Exudate Type: Serosanguineous Serosanguineous N/A OLAF, MESA. ([id number]) Exudate Color: red, brown red, brown N/A Wound Margin: Distinct, outline attached Flat and Intact N/A Granulation Amount: Medium (34-66%) None Present (0%) N/A Granulation Quality: Red N/A N/A Necrotic Amount: Medium (34-66%) Large (67-100%)  N/A Necrotic Tissue: Eschar, Adherent Slough Eschar N/A Exposed Structures: Fascia: No Fascia: No N/A Fat: No Fat: No Tendon: No Tendon: No Muscle: No Muscle: No Joint: No Joint: No Bone: No Bone: No Limited to Skin Limited to Skin Breakdown Breakdown Epithelialization: None Small (1-33%) N/A Periwound Skin Texture: Scarring: Yes Edema: No N/A Excoriation: No Induration: No Callus: No Crepitus: No Fluctuance: No Friable: No Rash: No Scarring: No Periwound Skin Moist: Yes Dry/Scaly: Yes N/A Moisture: Maceration: No Moist: No Periwound Skin Color: Erythema: Yes Atrophie Blanche: No N/A Cyanosis: No Ecchymosis: No Erythema: No Hemosiderin Staining: No Mottled: No Pallor: No Rubor: No Erythema Location: Circumferential N/A N/A Temperature: No Abnormality N/A N/A Tenderness on Yes No N/A Palpation: Wound Preparation: Ulcer Cleansing: Other: Ulcer Cleansing: N/A soap and water Rinsed/Irrigated with Saline Topical Anesthetic Applied: Other: lidocaine Topical Anesthetic 4% Applied: None Treatment Notes Electronic Signature(s) MAKI, SWEETSER ([id number]) Signed: 12/24/2015 4:28:52 PM By: Gretta Cool, RN, BSN, Kim RN, BSN Entered By: Gretta Cool, RN, BSN, Kim on 12/22/2015 13:09:49 Todd Davidson ([id number]) -------------------------------------------------------------------------------- Multi-Disciplinary Care Plan Details Patient Name: Todd Davidson Date of Service: 12/22/2015 12:45 PM Medical Record Number: [id number] Patient Account Number: [id number] Date of Birth/Sex: 01/10/1936 (80 y.o. Male) Treating RN: Cornell Barman Primary Care Physician: Lamonte Sakai Other Clinician: Referring Physician: Kasandra Knudsen Treating Physician/Extender: Melburn Hake, HOYT Weeks in Treatment: 4 Active Inactive Abuse / Safety / Falls / Self Care Management Nursing Diagnoses: Potential for falls Goals: Patient will remain injury free Date Initiated: 11/24/2015 Goal Status:  Active Interventions: Assess fall risk on admission and as needed Notes: Nutrition Nursing Diagnoses: Imbalanced nutrition Impaired glucose control: actual or potential Potential for alteratiion in Nutrition/Potential for imbalanced nutrition Goals: Patient/caregiver agrees to and verbalizes understanding of need to use nutritional supplements and/or vitamins as prescribed Date Initiated: 11/24/2015 Goal Status: Active Patient/caregiver verbalizes understanding of need to maintain therapeutic glucose control per primary care physician Date Initiated: 11/24/2015 Goal Status: Active Patient/caregiver will maintain therapeutic glucose control Date Initiated: 11/24/2015 Goal Status: Active Interventions: Assess patient nutrition upon admission and as needed per policy KOVEN, BELINSKY ([id number]) Provide education on elevated blood sugars and impact on wound healing Notes: Orientation to the Wound Care Program Nursing Diagnoses: Knowledge deficit related to the wound healing center program Goals: Patient/caregiver will verbalize understanding of the [place] [facility] Date Initiated: 11/24/2015 Goal Status: Active Interventions: Provide education on orientation to the wound center Notes: Pain, Acute or Chronic Nursing Diagnoses: Pain, acute or chronic: actual or potential Potential alteration in comfort, pain Goals:  Patient will verbalize adequate pain control and receive pain control interventions during procedures as needed Date Initiated: 11/24/2015 Goal Status: Active Patient/caregiver will verbalize adequate pain control between visits Date Initiated: 11/24/2015 Goal Status: Active Patient/caregiver will verbalize comfort level met Date Initiated: 11/24/2015 Goal Status: Active Interventions: Assess comfort goal upon admission Complete pain assessment as per visit requirements Notes: Wound/Skin Impairment Nursing Diagnoses: Impaired tissue integrity Kathan, Antoinne Darnell Level  ([id number]) Goals: Ulcer/skin breakdown will have a volume reduction of 30% by week 4 Date Initiated: 11/24/2015 Goal Status: Active Ulcer/skin breakdown will have a volume reduction of 50% by week 8 Date Initiated: 11/24/2015 Goal Status: Active Ulcer/skin breakdown will have a volume reduction of 80% by week 12 Date Initiated: 11/24/2015 Goal Status: Active Interventions: Assess patient/caregiver ability to perform ulcer/skin care regimen upon admission and as needed Notes: Electronic Signature(s) Signed: 12/24/2015 4:28:52 PM By: Gretta Cool, RN, BSN, Kim RN, BSN Entered By: Gretta Cool, RN, BSN, Kim on 12/22/2015 13:08:42 Todd Davidson ([id number]) -------------------------------------------------------------------------------- Pain Assessment Details Patient Name: Todd Davidson Date of Service: 12/22/2015 12:45 PM Medical Record Number: [id number] Patient Account Number: [id number] Date of Birth/Sex: 1935-07-25 (80 y.o. Male) Treating RN: Cornell Barman Primary Care Physician: Lamonte Sakai Other Clinician: Referring Physician: Kasandra Knudsen Treating Physician/Extender: Melburn Hake, HOYT Weeks in Treatment: 4 Active Problems Location of Pain Severity and Description of Pain Patient Has Paino No Site Locations With Dressing Change: No Pain Management and Medication Current Pain Management: Electronic Signature(s) Signed: 12/24/2015 4:28:52 PM By: Gretta Cool, RN, BSN, Kim RN, BSN Entered By: Gretta Cool, RN, BSN, Kim on 12/22/2015 12:58:47 Todd Davidson ([id number]) -------------------------------------------------------------------------------- Patient/Caregiver Education Details Patient Name: Todd Davidson Date of Service: 12/22/2015 12:45 PM Medical Record Number: [id number] Patient Account Number: [id number] Date of Birth/Gender: 16-Sep-1935 (80 y.o. Male) Treating RN: Cornell Barman Primary Care Physician: Lamonte Sakai Other Clinician: Referring Physician: Kasandra Knudsen Treating Physician/Extender:  Sharalyn Ink in Treatment: 4 Education Assessment Education Provided To: Patient Education Topics Provided Venous: Handouts: Controlling Swelling with Multilayered Compression Wraps Methods: Demonstration, Explain/Verbal Responses: State content correctly Wound/Skin Impairment: Electronic Signature(s) Signed: 12/24/2015 4:28:52 PM By: Gretta Cool, RN, BSN, Kim RN, BSN Entered By: Gretta Cool, RN, BSN, Kim on 12/22/2015 13:56:42 Todd Davidson ([id number]) -------------------------------------------------------------------------------- Wound Assessment Details Patient Name: Todd Davidson Date of Service: 12/22/2015 12:45 PM Medical Record Number: [id number] Patient Account Number: [id number] Date of Birth/Sex: 07/22/1935 (80 y.o. Male) Treating RN: Cornell Barman Primary Care Physician: Lamonte Sakai Other Clinician: Referring Physician: Kasandra Knudsen Treating Physician/Extender: Melburn Hake, HOYT Weeks in Treatment: 4 Wound Status Wound Number: 1 Primary Diabetic Wound/Ulcer of the Lower Etiology: Extremity Wound Location: Right Lower Leg - Lateral Secondary Trauma, Other Wounding Event: Trauma Etiology: Date Acquired: 10/20/2015 Wound Open Weeks Of Treatment: 4 Status: Clustered Wound: No Comorbid Cataracts, Asthma, Sleep Apnea, History: Coronary Artery Disease, Hypertension, Type II Diabetes, Osteoarthritis, Neuropathy Photos Wound Measurements Length: (cm) 1.2 Width: (cm) 0.6 Depth: (cm) 0.1 Area: (cm) 0.565 Volume: (cm) 0.057 % Reduction in Area: 92.6% % Reduction in Volume: 92.6% Epithelialization: None Tunneling: No Undermining: No Wound Description Classification: Grade 1 Wound Margin: Distinct, outline attached Exudate Amount: Large Exudate Type: Serosanguineous Exudate Color: red, brown Foul Odor After Cleansing: No Wound Bed Granulation Amount: Medium (34-66%) Exposed Structure Granulation Quality: Red Fascia Exposed: No Necrotic Amount: Medium  (34-66%) Fat Layer Exposed: No Necrotic Quality: Eschar, Adherent Slough Tendon Exposed: No Clontz, Sostenes G. ([id number]) Muscle Exposed: No Joint Exposed: No Bone Exposed: No Limited to Skin Breakdown Periwound  Skin Texture Texture Color No Abnormalities Noted: No No Abnormalities Noted: No Scarring: Yes Erythema: Yes Erythema Location: Circumferential Moisture No Abnormalities Noted: No Temperature / Pain Moist: Yes Temperature: No Abnormality Tenderness on Palpation: Yes Wound Preparation Ulcer Cleansing: Other: soap and water, Topical Anesthetic Applied: Other: lidocaine 4%, Treatment Notes Wound #1 (Right, Lateral Lower Leg) 1. Cleansed with: Clean wound with Normal Saline Cleanse wound with antibacterial soap and water 2. Anesthetic Topical Lidocaine 4% cream to wound bed prior to debridement 4. Dressing Applied: Iodosorb Ointment 5. Secondary Dressing Applied ABD Pad 7. Secured with 2 Layer Lite Compression System - Right Lower Extremity Electronic Signature(s) Signed: 12/24/2015 4:28:52 PM By: Gretta Cool, RN, BSN, Kim RN, BSN Entered By: Gretta Cool, RN, BSN, Kim on 12/22/2015 13:49:12 Todd Davidson ([id number]) -------------------------------------------------------------------------------- Wound Assessment Details Patient Name: Todd Davidson Date of Service: 12/22/2015 12:45 PM Medical Record Number: [id number] Patient Account Number: [id number] Date of Birth/Sex: September 11, 1935 (80 y.o. Male) Treating RN: Cornell Barman Primary Care Physician: Lamonte Sakai Other Clinician: Referring Physician: Kasandra Knudsen Treating Physician/Extender: Melburn Hake, HOYT Weeks in Treatment: 4 Wound Status Wound Number: 2 Primary Trauma, Other Etiology: Wound Location: Right Knee Wound Open Wounding Event: Trauma Status: Date Acquired: 12/04/2015 Comorbid Cataracts, Asthma, Sleep Apnea, Weeks Of Treatment: 2 History: Coronary Artery Disease, Hypertension, Clustered Wound: Yes Type II  Diabetes, Osteoarthritis, Neuropathy Photos Wound Measurements Length: (cm) 0.8 Width: (cm) 1.2 Depth: (cm) 0.1 Area: (cm) 0.754 Volume: (cm) 0.075 % Reduction in Area: 94.5% % Reduction in Volume: 94.5% Epithelialization: Small (1-33%) Tunneling: No Undermining: No Wound Description Classification: Partial Thickness Foul Odor Diabetic Severity (Wagner): Grade 1 Wound Margin: Flat and Intact Exudate Amount: Small Exudate Type: Serosanguineous Exudate Color: red, brown After Cleansing: No Wound Bed Granulation Amount: None Present (0%) Exposed Structure Necrotic Amount: Large (67-100%) Fascia Exposed: No Necrotic Quality: Eschar Fat Layer Exposed: No Tendon Exposed: No Muscle Exposed: No [place], Burnis G. ([id number]) Joint Exposed: No Bone Exposed: No Limited to Skin Breakdown Periwound Skin Texture Texture Color No Abnormalities Noted: No No Abnormalities Noted: No Callus: No Atrophie Blanche: No Crepitus: No Cyanosis: No Excoriation: No Ecchymosis: No Fluctuance: No Erythema: No Friable: No Hemosiderin Staining: No Induration: No Mottled: No Localized Edema: No Pallor: No Rash: No Rubor: No Scarring: No Moisture No Abnormalities Noted: No Dry / Scaly: Yes Maceration: No Moist: No Wound Preparation Ulcer Cleansing: Rinsed/Irrigated with Saline Topical Anesthetic Applied: None Electronic Signature(s) Signed: 12/24/2015 4:28:52 PM By: Gretta Cool, RN, BSN, Kim RN, BSN Entered By: Gretta Cool, RN, BSN, Kim on 12/22/2015 13:07:40 Todd Davidson ([id number]) -------------------------------------------------------------------------------- Vitals Details Patient Name: Todd Davidson Date of Service: 12/22/2015 12:45 PM Medical Record Number: [id number] Patient Account Number: [id number] Date of Birth/Sex: [birth year] (80 y.o. Male) Treating RN: Cornell Barman Primary Care Physician: Lamonte Sakai Other Clinician: Referring Physician: Kasandra Knudsen Treating  Physician/Extender: Melburn Hake, HOYT Weeks in Treatment: 4 Vital Signs Time Taken: 12:58 Temperature (F): 98.3 Height (in): 72 Pulse (bpm): 56 Weight (lbs): 160 Respiratory Rate (breaths/min): 16 Body Mass Index (BMI): 21.7 Blood Pressure (mmHg): 116/45 Reference Range: 80 - 120 mg / dl Electronic Signature(s) Signed: 12/24/2015 4:28:52 PM By: Gretta Cool, RN, BSN, Kim RN, BSN Entered By: Gretta Cool, RN, BSN, Kim on 12/22/2015 12:59:04

## 2015-12-29 ENCOUNTER — Encounter: Payer: Medicare Other | Admitting: Physician Assistant

## 2015-12-29 DIAGNOSIS — I87311 Chronic venous hypertension (idiopathic) with ulcer of right lower extremity: Secondary | ICD-10-CM | POA: Diagnosis not present

## 2015-12-30 NOTE — Progress Notes (Signed)
CALIPH, BOROWIAK ([id number]) Visit Report for 12/29/2015 Arrival Information Details Patient Name: MCKENZIE, TORUNO. Date of Service: 12/29/2015 9:15 AM Medical Record Number: [id number] Patient Account Number: [id number] Date of Birth/Sex: 03-19-1936 (80 y.o. Male) Treating RN: Montey Hora Primary Care Physician: Lamonte Sakai Other Clinician: Referring Physician: Lamonte Sakai Treating Physician/Extender: Melburn Hake, HOYT Weeks in Treatment: 5 Visit Information History Since Last Visit Added or deleted any medications: No Patient Arrived: Kasandra Knudsen Any new allergies or adverse reactions: No Arrival Time: 08:47 Had a fall or experienced change in No Accompanied By: self activities of daily living that may affect Transfer Assistance: None risk of falls: Patient Identification Verified: Yes Signs or symptoms of abuse/neglect since last No Secondary Verification Process Completed: Yes visito Patient Requires Transmission-Based No Hospitalized since last visit: No Precautions: Pain Present Now: No Patient Has Alerts: Yes Patient Alerts: DM II Electronic Signature(s) Signed: 12/29/2015 4:06:00 PM By: Montey Hora Entered By: Montey Hora on 12/29/2015 08:48:33 [name] ([id number]) -------------------------------------------------------------------------------- Encounter Discharge Information Details Patient Name: [name] Date of Service: 12/29/2015 9:15 AM Medical Record Number: [id number] Patient Account Number: [id number] Date of Birth/Sex: 06-Aug-1935 (80 y.o. Male) Treating RN: Montey Hora Primary Care Physician: Lamonte Sakai Other Clinician: Referring Physician: Lamonte Sakai Treating Physician/Extender: Melburn Hake, HOYT Weeks in Treatment: 5 Encounter Discharge Information Items Discharge Pain Level: 0 Discharge Condition: Stable Ambulatory Status: Ambulatory Discharge Destination: Home Transportation: Private Auto Accompanied By: self Schedule Follow-up  Appointment: Yes Medication Reconciliation completed and provided to Patient/Care No Lynda Capistran: Provided on Clinical Summary of Care: 12/29/2015 Form Type Recipient Paper Patient LI Electronic Signature(s) Signed: 12/29/2015 9:23:43 AM By: Ruthine Dose Entered By: Ruthine Dose on 12/29/2015 09:23:43 [name] ([id number]) -------------------------------------------------------------------------------- Lower Extremity Assessment Details Patient Name: [name] Date of Service: 12/29/2015 9:15 AM Medical Record Number: [id number] Patient Account Number: [id number] Date of Birth/Sex: 1935-10-09 (80 y.o. Male) Treating RN: Montey Hora Primary Care Physician: Lamonte Sakai Other Clinician: Referring Physician: Lamonte Sakai Treating Physician/Extender: Melburn Hake, HOYT Weeks in Treatment: 5 Edema Assessment Assessed: [Left: No] [Right: No] Edema: [Left: N] [Right: o] Calf Left: Right: Point of Measurement: 32 cm From Medial Instep cm cm Ankle Left: Right: Point of Measurement: 10 cm From Medial Instep cm cm Vascular Assessment Pulses: Posterior Tibial Dorsalis Pedis Palpable: [Right:Yes] Extremity colors, hair growth, and conditions: Extremity Color: [Right:Hyperpigmented] Hair Growth on Extremity: [Right:Yes] Temperature of Extremity: [Right:Warm] Capillary Refill: [Right:< 3 seconds] Electronic Signature(s) Signed: 12/29/2015 4:06:00 PM By: Montey Hora Entered By: Montey Hora on 12/29/2015 09:09:36 [name] ([id number]) -------------------------------------------------------------------------------- Multi Wound Chart Details Patient Name: [name] Date of Service: 12/29/2015 9:15 AM Medical Record Number: [id number] Patient Account Number: [id number] Date of Birth/Sex: 13-Jun-1935 (80 y.o. Male) Treating RN: Montey Hora Primary Care Physician: Lamonte Sakai Other Clinician: Referring Physician: Lamonte Sakai Treating Physician/Extender:  Melburn Hake, HOYT Weeks in Treatment: 5 Vital Signs Height(in): 72 Capillary Blood 103 Glucose(mg/dl): Weight(lbs): 160 Pulse(bpm): 80 Body Mass Index(BMI): 22 Blood Pressure Temperature(F): 98.1 138/64 (mmHg): Respiratory Rate 16 (breaths/min): Photos: [N/A:N/A] Wound Location: Right Lower Leg - Lateral Right Knee N/A Wounding Event: Trauma Trauma N/A Primary Etiology: Diabetic Wound/Ulcer of Trauma, Other N/A the Lower Extremity Secondary Etiology: Trauma, Other N/A N/A Comorbid History: Cataracts, Asthma, Sleep Cataracts, Asthma, Sleep N/A Apnea, Coronary Artery Apnea, Coronary Artery Disease, Hypertension, Disease, Hypertension, Type II Diabetes, Type II Diabetes, Osteoarthritis, Neuropathy Osteoarthritis, Neuropathy Date Acquired: 10/20/2015 12/04/2015 N/A Weeks of Treatment: 5 3 N/A Wound Status: Open Healed -  Epithelialized N/A Clustered Wound: No Yes N/A Measurements L x W x D 1.2x0.5x0.1 0x0x0 N/A (cm) Area (cm) : 0.471 0 N/A Volume (cm) : 0.047 0 N/A % Reduction in Area: 93.80% 100.00% N/A % Reduction in Volume: 93.90% 100.00% N/A Classification: Grade 1 Partial Thickness N/A HBO Classification: N/A Grade 1 N/A Exudate Amount: Large None Present N/A NOELLE, HOOGLAND. ([id number]) Exudate Type: Serosanguineous N/A N/A Exudate Color: red, brown N/A N/A Wound Margin: Distinct, outline attached Flat and Intact N/A Granulation Amount: Large (67-100%) None Present (0%) N/A Granulation Quality: Red N/A N/A Necrotic Amount: Small (1-33%) None Present (0%) N/A Necrotic Tissue: Eschar, Adherent Slough N/A N/A Exposed Structures: Fascia: No Fascia: No N/A Fat: No Fat: No Tendon: No Tendon: No Muscle: No Muscle: No Joint: No Joint: No Bone: No Bone: No Limited to Skin Limited to Skin Breakdown Breakdown Epithelialization: Small (1-33%) Large (67-100%) N/A Periwound Skin Texture: Scarring: Yes Edema: No N/A Excoriation: No Induration: No Callus:  No Crepitus: No Fluctuance: No Friable: No Rash: No Scarring: No Periwound Skin Moist: Yes Maceration: No N/A Moisture: Moist: No Dry/Scaly: No Periwound Skin Color: Erythema: Yes Atrophie Blanche: No N/A Cyanosis: No Ecchymosis: No Erythema: No Hemosiderin Staining: No Mottled: No Pallor: No Rubor: No Erythema Location: Circumferential N/A N/A Temperature: No Abnormality N/A N/A Tenderness on No No N/A Palpation: Wound Preparation: Ulcer Cleansing: Other: Ulcer Cleansing: N/A soap and water Rinsed/Irrigated with Saline Topical Anesthetic Applied: Other: lidocaine Topical Anesthetic 4% Applied: None Treatment Notes KROSBY, RITCHIE ([id number]) Electronic Signature(s) Signed: 12/29/2015 4:06:00 PM By: Montey Hora Entered By: Montey Hora on 12/29/2015 09:08:03 [name] ([id number]) -------------------------------------------------------------------------------- Multi-Disciplinary Care Plan Details Patient Name: [name] Date of Service: 12/29/2015 9:15 AM Medical Record Number: [id number] Patient Account Number: [id number] Date of Birth/Sex: 04-26-1935 (80 y.o. Male) Treating RN: Montey Hora Primary Care Physician: Lamonte Sakai Other Clinician: Referring Physician: Lamonte Sakai Treating Physician/Extender: Melburn Hake, HOYT Weeks in Treatment: 5 Active Inactive Abuse / Safety / Falls / Self Care Management Nursing Diagnoses: Potential for falls Goals: Patient will remain injury free Date Initiated: 11/24/2015 Goal Status: Active Interventions: Assess fall risk on admission and as needed Notes: Nutrition Nursing Diagnoses: Imbalanced nutrition Impaired glucose control: actual or potential Potential for alteratiion in Nutrition/Potential for imbalanced nutrition Goals: Patient/caregiver agrees to and verbalizes understanding of need to use nutritional supplements and/or vitamins as prescribed Date Initiated: 11/24/2015 Goal Status:  Active Patient/caregiver verbalizes understanding of need to maintain therapeutic glucose control per primary care physician Date Initiated: 11/24/2015 Goal Status: Active Patient/caregiver will maintain therapeutic glucose control Date Initiated: 11/24/2015 Goal Status: Active Interventions: Assess patient nutrition upon admission and as needed per policy TROI, FLORENDO ([id number]) Provide education on elevated blood sugars and impact on wound healing Notes: Orientation to the Wound Care Program Nursing Diagnoses: Knowledge deficit related to the wound healing center program Goals: Patient/caregiver will verbalize understanding of the [facility] Date Initiated: 11/24/2015 Goal Status: Active Interventions: Provide education on orientation to the wound center Notes: Pain, Acute or Chronic Nursing Diagnoses: Pain, acute or chronic: actual or potential Potential alteration in comfort, pain Goals: Patient will verbalize adequate pain control and receive pain control interventions during procedures as needed Date Initiated: 11/24/2015 Goal Status: Active Patient/caregiver will verbalize adequate pain control between visits Date Initiated: 11/24/2015 Goal Status: Active Patient/caregiver will verbalize comfort level met Date Initiated: 11/24/2015 Goal Status: Active Interventions: Assess comfort goal upon admission Complete pain assessment as per visit requirements Notes:  Wound/Skin Impairment Nursing Diagnoses: Impaired tissue integrity Hume, Donevin G. ([id number]) Goals: Ulcer/skin breakdown will have a volume reduction of 30% by week 4 Date Initiated: 11/24/2015 Goal Status: Active Ulcer/skin breakdown will have a volume reduction of 50% by week 8 Date Initiated: 11/24/2015 Goal Status: Active Ulcer/skin breakdown will have a volume reduction of 80% by week 12 Date Initiated: 11/24/2015 Goal Status: Active Interventions: Assess patient/caregiver ability to perform  ulcer/skin care regimen upon admission and as needed Notes: Electronic Signature(s) Signed: 12/29/2015 4:06:00 PM By: Montey Hora Entered By: Montey Hora on 12/29/2015 09:07:56 [name] ([id number]) -------------------------------------------------------------------------------- Pain Assessment Details Patient Name: [name] Date of Service: 12/29/2015 9:15 AM Medical Record Number: [id number] Patient Account Number: [id number] Date of Birth/Sex: [birth year] (80 y.o. Male) Treating RN: Montey Hora Primary Care Physician: Lamonte Sakai Other Clinician: Referring Physician: Lamonte Sakai Treating Physician/Extender: Melburn Hake, HOYT Weeks in Treatment: 5 Active Problems Location of Pain Severity and Description of Pain Patient Has Paino No Site Locations Pain Management and Medication Current Pain Management: Notes Topical or injectable lidocaine is offered to patient for acute pain when surgical debridement is performed. If needed, Patient is instructed to use over the counter pain medication for the following 24-48 hours after debridement. Wound care MDs do not prescribed pain medications. Patient has chronic pain or uncontrolled pain. Patient has been instructed to make an appointment with their Primary Care Physician for pain management. Electronic Signature(s) Signed: 12/29/2015 4:06:00 PM By: Montey Hora Entered By: Montey Hora on 12/29/2015 08:48:43 [name] ([id number]) -------------------------------------------------------------------------------- Patient/Caregiver Education Details Patient Name: [name] Date of Service: 12/29/2015 9:15 AM Medical Record Number: [id number] Patient Account Number: [id number] Date of Birth/Gender: November 13, 1935 (80 y.o. Male) Treating RN: Montey Hora Primary Care Physician: Lamonte Sakai Other Clinician: Referring Physician: Lamonte Sakai Treating Physician/Extender: Sharalyn Ink in Treatment:  5 Education Assessment Education Provided To: Patient Education Topics Provided Venous: Handouts: Other: purpose of compression wrap Methods: Explain/Verbal Responses: State content correctly Electronic Signature(s) Signed: 12/29/2015 4:06:00 PM By: Montey Hora Entered By: Montey Hora on 12/29/2015 09:12:43 [name] ([id number]) -------------------------------------------------------------------------------- Wound Assessment Details Patient Name: [name] Date of Service: 12/29/2015 9:15 AM Medical Record Number: [id number] Patient Account Number: [id number] Date of Birth/Sex: [birth year] (80 y.o. Male) Treating RN: Montey Hora Primary Care Physician: Lamonte Sakai Other Clinician: Referring Physician: Lamonte Sakai Treating Physician/Extender: Melburn Hake, HOYT Weeks in Treatment: 5 Wound Status Wound Number: 1 Primary Diabetic Wound/Ulcer of the Lower Etiology: Extremity Wound Location: Right, Lateral Lower Leg Secondary Trauma, Other Wounding Event: Trauma Etiology: Date Acquired: 10/20/2015 Wound Open Weeks Of Treatment: 5 Status: Clustered Wound: No Comorbid Cataracts, Asthma, Sleep Apnea, History: Coronary Artery Disease, Hypertension, Type II Diabetes, Osteoarthritis, Neuropathy Photos Wound Measurements Length: (cm) 1 Width: (cm) 0.3 Depth: (cm) 0.1 Area: (cm) 0.236 Volume: (cm) 0.024 % Reduction in Area: 96.9% % Reduction in Volume: 96.9% Epithelialization: Small (1-33%) Tunneling: No Undermining: No Wound Description Classification: Grade 1 Wound Margin: Distinct, outline attached Exudate Amount: Large Exudate Type: Serosanguineous Exudate Color: red, brown Foul Odor After Cleansing: No Wound Bed Granulation Amount: Large (67-100%) Exposed Structure Granulation Quality: Red Fascia Exposed: No Caamano, Jarell G. ([id number]) Necrotic Amount: Small (1-33%) Fat Layer Exposed: No Necrotic Quality: Eschar, Adherent Slough Tendon  Exposed: No Muscle Exposed: No Joint Exposed: No Bone Exposed: No Limited to Skin Breakdown Periwound Skin Texture Texture Color No Abnormalities Noted: No No Abnormalities Noted: No Scarring: Yes Erythema: Yes Erythema Location: Circumferential Moisture  No Abnormalities Noted: No Temperature / Pain Moist: Yes Temperature: No Abnormality Wound Preparation Ulcer Cleansing: Other: soap and water, Topical Anesthetic Applied: Other: lidocaine 4%, Treatment Notes Wound #1 (Right, Lateral Lower Leg) 1. Cleansed with: Cleanse wound with antibacterial soap and water 2. Anesthetic Topical Lidocaine 4% cream to wound bed prior to debridement 4. Dressing Applied: Iodoflex 5. Secondary Dressing Applied ABD Pad 7. Secured with 2 Layer Lite Compression System - Right Lower Extremity Electronic Signature(s) Signed: 12/29/2015 4:06:00 PM By: Montey Hora Entered By: Montey Hora on 12/29/2015 09:09:16 [name] ([id number]) -------------------------------------------------------------------------------- Wound Assessment Details Patient Name: [name] Date of Service: 12/29/2015 9:15 AM Medical Record Number: [id number] Patient Account Number: [id number] Date of Birth/Sex: Mar 10, 1936 (80 y.o. Male) Treating RN: Montey Hora Primary Care Physician: Lamonte Sakai Other Clinician: Referring Physician: Lamonte Sakai Treating Physician/Extender: Melburn Hake, HOYT Weeks in Treatment: 5 Wound Status Wound Number: 2 Primary Trauma, Other Etiology: Wound Location: Right Knee Wound Healed - Epithelialized Wounding Event: Trauma Status: Date Acquired: 12/04/2015 Comorbid Cataracts, Asthma, Sleep Apnea, Weeks Of Treatment: 3 History: Coronary Artery Disease, Hypertension, Clustered Wound: Yes Type II Diabetes, Osteoarthritis, Neuropathy Photos Wound Measurements Length: (cm) 0 % Reduction Width: (cm) 0 % Reduction Depth: (cm) 0 Epitheliali Area: (cm)  0 Tunneling: Volume: (cm) 0 Underminin in Area: 100% in Volume: 100% zation: Large (67-100%) No g: No Wound Description Classification: Partial Thickness Foul Odor A Diabetic Severity (Wagner): Grade 1 Wound Margin: Flat and Intact Exudate Amount: None Present fter Cleansing: No Wound Bed Granulation Amount: None Present (0%) Exposed Structure Necrotic Amount: None Present (0%) Fascia Exposed: No Fat Layer Exposed: No Tendon Exposed: No Muscle Exposed: No Collison, Dak G. ([id number]) Joint Exposed: No Bone Exposed: No Limited to Skin Breakdown Periwound Skin Texture Texture Color No Abnormalities Noted: No No Abnormalities Noted: No Callus: No Atrophie Blanche: No Crepitus: No Cyanosis: No Excoriation: No Ecchymosis: No Fluctuance: No Erythema: No Friable: No Hemosiderin Staining: No Induration: No Mottled: No Localized Edema: No Pallor: No Rash: No Rubor: No Scarring: No Moisture No Abnormalities Noted: No Dry / Scaly: No Maceration: No Moist: No Wound Preparation Ulcer Cleansing: Rinsed/Irrigated with Saline Topical Anesthetic Applied: None Electronic Signature(s) Signed: 12/29/2015 4:06:00 PM By: Montey Hora Entered By: Montey Hora on 12/29/2015 09:00:51 [name] ([id number]) -------------------------------------------------------------------------------- Vitals Details Patient Name: [name] Date of Service: 12/29/2015 9:15 AM Medical Record Number: [id number] Patient Account Number: [id number] Date of Birth/Sex: 1936-02-16 (80 y.o. Male) Treating RN: Montey Hora Primary Care Physician: Lamonte Sakai Other Clinician: Referring Physician: Lamonte Sakai Treating Physician/Extender: Melburn Hake, HOYT Weeks in Treatment: 5 Vital Signs Time Taken: 08:48 Temperature (F): 98.1 Height (in): 72 Pulse (bpm): 55 Weight (lbs): 160 Respiratory Rate (breaths/min): 16 Body Mass Index (BMI): 21.7 Blood Pressure (mmHg):  138/64 Capillary Blood Glucose (mg/dl): 103 Reference Range: 80 - 120 mg / dl Electronic Signature(s) Signed: 12/29/2015 4:06:00 PM By: Montey Hora Entered By: Montey Hora on 12/29/2015 44:73:95

## 2015-12-30 NOTE — Progress Notes (Signed)
DAMOND, PERPICH ([id number]) Visit Report for 12/29/2015 Chief Complaint Document Details Patient Name: Todd Davidson, Todd Davidson. Date of Service: 12/29/2015 9:15 AM Medical Record Number: [id number] Patient Account Number: [id number] Date of Birth/Sex: 05/18/1935 (80 y.o. Male) Treating RN: Montey Hora Primary Care Physician: Lamonte Sakai Other Clinician: Referring Physician: Lamonte Sakai Treating Physician/Extender: Melburn Hake, HOYT Weeks in Treatment: 5 Information Obtained from: Patient Chief Complaint Patient is here for a chronic wound on his right lower leg Electronic Signature(s) Signed: 12/30/2015 9:39:47 AM By: Worthy Keeler PA-C Entered By: Worthy Keeler on 12/29/2015 09:17:18 Todd Davidson ([id number]) -------------------------------------------------------------------------------- HPI Details Patient Name: Todd Davidson Date of Service: 12/29/2015 9:15 AM Medical Record Number: [id number] Patient Account Number: [id number] Date of Birth/Sex: 1935-07-24 (80 y.o. Male) Treating RN: Montey Hora Primary Care Physician: Lamonte Sakai Other Clinician: Referring Physician: Lamonte Sakai Treating Physician/Extender: Melburn Hake, HOYT Weeks in Treatment: 5 History of Present Illness HPI Description: 11/24/15; this patient is a 80 year old man with type 2 diabetes on insulin. While mowing the lawn on August 1 the patient traumatized his right leg. He was seen in the ER and had 8 sutures. He tells me the sutures were removed all over my understanding the wound at already dehisced. He has been using peroxide and Betadine on the wound. There is a fair amount of pain but no drainage. He probably has diabetic neuropathy. He has no known history of PAD. ABIs in this clinic were 1.02 on the right and 1.11 on the left. 12/01/15; the patient's wound is down 1 cm in width. Surface still required debridement 12/08/15; patient's wound continues to improve although he continues to require debridement.  I have been using Prisma I will to Iodoflex. Patient has 2 superficial open areas over his patella on the right 12/15/15; patient's wound appeared healthier today. I been using Iodoflex for one week. Areas on his right anterior lateral leg. Superficial excoriations on his patella appears stable I have not really done anything particular about this 12/22/15 patient presents today for a follow-up visit concerning his ongoing right lower extremity open wound. We have been using Iodoflex which she is tolerating well at this point. Subsequently he has also been tolerating the 2 layer compression wrapping that we have recommended. Subsequently his right knee where he had some superficial excoriations does seem to be healing this is scabbed over at this point in time and shows no sign of infection which is excellent news. He tells me that he really is not having any significant pain in fact he rates his be a 0 out of 10 at this point in time. 12/29/15 patient presents today for follow-up concerning the right lower extremity open wound. Currently again we have been utilizing the Iodoflex dressing which he continues to do well with. The 2 layer compression wrapping he is tolerating but he wonders why we have to wrap his entire leg for such a small wound. Subsequently he was concerned that today he should've actually been doing better and in fact was hoping this would be healed when it was unwrapped. Obviously it was not. This is progressing nicely and my opinion when looking at the charts currently. I explained to patient that he does have to be somewhat patient with this process as again wounds do not heal often as fast as we would like them to move were dealing with the chronic nature of this type of wound. However we are seeing continual improvement week by week. Electronic Signature(s) Signed: 12/30/2015  9:39:47 AM By: Worthy Keeler PA-C Entered By: Worthy Keeler on 12/29/2015 09:19:14 Todd Davidson ([id number]) -------------------------------------------------------------------------------- Physical Exam Details Patient Name: Todd Davidson Date of Service: 12/29/2015 9:15 AM Medical Record Number: [id number] Patient Account Number: [id number] Date of Birth/Sex: [birth year] (80 y.o. Male) Treating RN: Montey Hora Primary Care Physician: Lamonte Sakai Other Clinician: Referring Physician: Lamonte Sakai Treating Physician/Extender: Melburn Hake, HOYT Weeks in Treatment: 5 Constitutional Well-nourished and well-hydrated in no acute distress. Respiratory normal breathing without difficulty. clear to auscultation bilaterally. Cardiovascular regular rate and rhythm with normal S1, S2. trace pitting edema right lower extremity. Psychiatric this patient is able to make decisions and demonstrates good insight into disease process. Alert and Oriented x 3. pleasant and cooperative. Notes Patient's wound actually has very new epithelial skin noted along the margin filling in outside to and. Obviously this is excellent news and I explained to patient that the wound looks very clean after just cleaning with saline the really was no slough still attached and the wound was doing very well in my opinion. Electronic Signature(s) Signed: 12/30/2015 9:39:47 AM By: Worthy Keeler PA-C Entered By: Worthy Keeler on 12/29/2015 09:20:37 Todd Davidson ([id number]) -------------------------------------------------------------------------------- Physician Orders Details Patient Name: Todd Davidson Date of Service: 12/29/2015 9:15 AM Medical Record Number: [id number] Patient Account Number: [id number] Date of Birth/Sex: 1935-07-29 (80 y.o. Male) Treating RN: Montey Hora Primary Care Physician: Lamonte Sakai Other Clinician: Referring Physician: Lamonte Sakai Treating Physician/Extender: Melburn Hake, HOYT Weeks in Treatment: 5 Verbal / Phone Orders: Yes Clinician: Montey Hora Read Back and  Verified: Yes Diagnosis Coding Wound Cleansing Wound #1 Right,Lateral Lower Leg o Clean wound with Normal Saline. Anesthetic Wound #1 Right,Lateral Lower Leg o Topical Lidocaine 4% cream applied to wound bed prior to debridement Primary Wound Dressing Wound #1 Right,Lateral Lower Leg o Iodoflex Secondary Dressing Wound #1 Right,Lateral Lower Leg o ABD pad o Dry Gauze Dressing Change Frequency Wound #1 Right,Lateral Lower Leg o Change dressing every week Follow-up Appointments Wound #1 Right,Lateral Lower Leg o Return Appointment in 1 week. Edema Control Wound #1 Right,Lateral Lower Leg o 2 Layer Lite Compression System - Right Lower Extremity o Elevate legs to the level of the heart and pump ankles as often as possible Additional Orders / Instructions Wound #1 Right,Lateral Lower Leg o Increase protein intake. CHRISTOPHEN, LEVANDOWSKI ([id number]) Electronic Signature(s) Signed: 12/29/2015 4:06:00 PM By: Montey Hora Signed: 12/30/2015 9:39:47 AM By: Worthy Keeler PA-C Entered By: Montey Hora on 12/29/2015 09:11:11 Todd Davidson ([id number]) -------------------------------------------------------------------------------- Problem List Details Patient Name: Todd Davidson Date of Service: 12/29/2015 9:15 AM Medical Record Number: [id number] Patient Account Number: [id number] Date of Birth/Sex: 1935/06/13 (80 y.o. Male) Treating RN: Montey Hora Primary Care Physician: Lamonte Sakai Other Clinician: Referring Physician: Lamonte Sakai Treating Physician/Extender: Melburn Hake, HOYT Weeks in Treatment: 5 Active Problems ICD-10 Encounter Code Description Active Date Diagnosis I87.311 Chronic venous hypertension (idiopathic) with ulcer of 11/24/2015 Yes right lower extremity S86.821D Laceration of other muscle(s) and tendon(s) at lower leg 11/24/2015 Yes level, right leg, subsequent encounter Inactive Problems Resolved Problems Electronic Signature(s) Signed:  12/30/2015 9:39:47 AM By: Worthy Keeler PA-C Entered By: Worthy Keeler on 12/29/2015 09:17:06 Todd Davidson ([id number]) -------------------------------------------------------------------------------- Progress Note Details Patient Name: Todd Davidson Date of Service: 12/29/2015 9:15 AM Medical Record Number: [id number] Patient Account Number: [id number] Date of Birth/Sex: 1935/05/23 (80 y.o. Male) Treating RN: Montey Hora Primary Care Physician: Lamonte Sakai Other Clinician: Referring  Physician: Lamonte Sakai Treating Physician/Extender: Melburn Hake, HOYT Weeks in Treatment: 5 Subjective Chief Complaint Information obtained from Patient Patient is here for a chronic wound on his right lower leg History of Present Illness (HPI) 11/24/15; this patient is a 80 year old man with type 2 diabetes on insulin. While mowing the lawn on August 1 the patient traumatized his right leg. He was seen in the ER and had 8 sutures. He tells me the sutures were removed all over my understanding the wound at already dehisced. He has been using peroxide and Betadine on the wound. There is a fair amount of pain but no drainage. He probably has diabetic neuropathy. He has no known history of PAD. ABIs in this clinic were 1.02 on the right and 1.11 on the left. 12/01/15; the patient's wound is down 1 cm in width. Surface still required debridement 12/08/15; patient's wound continues to improve although he continues to require debridement. I have been using Prisma I will to Iodoflex. Patient has 2 superficial open areas over his patella on the right 12/15/15; patient's wound appeared healthier today. I been using Iodoflex for one week. Areas on his right anterior lateral leg. Superficial excoriations on his patella appears stable I have not really done anything particular about this 12/22/15 patient presents today for a follow-up visit concerning his ongoing right lower extremity open wound. We have been using  Iodoflex which she is tolerating well at this point. Subsequently he has also been tolerating the 2 layer compression wrapping that we have recommended. Subsequently his right knee where he had some superficial excoriations does seem to be healing this is scabbed over at this point in time and shows no sign of infection which is excellent news. He tells me that he really is not having any significant pain in fact he rates his be a 0 out of 10 at this point in time. 12/29/15 patient presents today for follow-up concerning the right lower extremity open wound. Currently again we have been utilizing the Iodoflex dressing which he continues to do well with. The 2 layer compression wrapping he is tolerating but he wonders why we have to wrap his entire leg for such a small wound. Subsequently he was concerned that today he should've actually been doing better and in fact was hoping this would be healed when it was unwrapped. Obviously it was not. This is progressing nicely and my opinion when looking at the charts currently. I explained to patient that he does have to be somewhat patient with this process as again wounds do not heal often as fast as we would like them to move were dealing with the chronic nature of this type of wound. However we are seeing continual improvement week by week. Todd Davidson, Todd Davidson ([id number]) Objective Constitutional Well-nourished and well-hydrated in no acute distress. Vitals Time Taken: 8:48 AM, Height: 72 in, Weight: 160 lbs, BMI: 21.7, Temperature: 98.1 F, Pulse: 55 bpm, Respiratory Rate: 16 breaths/min, Blood Pressure: 138/64 mmHg, Capillary Blood Glucose: 103 mg/dl. Respiratory normal breathing without difficulty. clear to auscultation bilaterally. Cardiovascular regular rate and rhythm with normal S1, S2. trace pitting edema right lower extremity. Psychiatric this patient is able to make decisions and demonstrates good insight into disease process. Alert  and Oriented x 3. pleasant and cooperative. General Notes: Patient's wound actually has very new epithelial skin noted along the margin filling in outside to and. Obviously this is excellent news and I explained to patient that the wound looks very clean after just  cleaning with saline the really was no slough still attached and the wound was doing very well in my opinion. Integumentary (Hair, Skin) Wound #1 status is Open. Original cause of wound was Trauma. The wound is located on the Right,Lateral Lower Leg. The wound measures 1cm length x 0.3cm width x 0.1cm depth; 0.236cm^2 area and 0.024cm^3 volume. The wound is limited to skin breakdown. There is no tunneling or undermining noted. There is a large amount of serosanguineous drainage noted. The wound margin is distinct with the outline attached to the wound base. There is large (67-100%) red granulation within the wound bed. There is a small (1-33%) amount of necrotic tissue within the wound bed including Eschar and Adherent Slough. The periwound skin appearance exhibited: Scarring, Moist, Erythema. The surrounding wound skin color is noted with erythema which is circumferential. Periwound temperature was noted as No Abnormality. Wound #2 status is Healed - Epithelialized. Original cause of wound was Trauma. The wound is located on the Right Knee. The wound measures 0cm length x 0cm width x 0cm depth; 0cm^2 area and 0cm^3 volume. The wound is limited to skin breakdown. There is no tunneling or undermining noted. There is a none present amount of drainage noted. The wound margin is flat and intact. There is no granulation within the wound bed. There is no necrotic tissue within the wound bed. The periwound skin appearance did not exhibit: Callus, Crepitus, Excoriation, Fluctuance, Friable, Induration, Localized Edema, Rash, Scarring, Dry/Scaly, Maceration, Moist, Atrophie Blanche, Cyanosis, Ecchymosis, Hemosiderin Staining, Mottled, Pallor,  Rubor, Erythema. Todd Davidson, Todd Davidson ([id number]) Assessment Active Problems ICD-10 I87.311 - Chronic venous hypertension (idiopathic) with ulcer of right lower extremity S86.821D - Laceration of other muscle(s) and tendon(s) at lower leg level, right leg, subsequent encounter Diagnoses ICD-10 I87.311: Chronic venous hypertension (idiopathic) with ulcer of right lower extremity S86.821D: Laceration of other muscle(s) and tendon(s) at lower leg level, right leg, subsequent encounter Plan Wound Cleansing: Wound #1 Right,Lateral Lower Leg: Clean wound with Normal Saline. Anesthetic: Wound #1 Right,Lateral Lower Leg: Topical Lidocaine 4% cream applied to wound bed prior to debridement Primary Wound Dressing: Wound #1 Right,Lateral Lower Leg: Iodoflex Secondary Dressing: Wound #1 Right,Lateral Lower Leg: ABD pad Dry Gauze Dressing Change Frequency: Wound #1 Right,Lateral Lower Leg: Change dressing every week Follow-up Appointments: Wound #1 Right,Lateral Lower Leg: Return Appointment in 1 week. Edema Control: Wound #1 Right,Lateral Lower Leg: 2 Layer Lite Compression System - Right Lower Extremity Elevate legs to the level of the heart and pump ankles as often as possible Additional Orders / Instructions: Wound #1 Right,Lateral Lower Leg: Increase protein intake. Todd Davidson, Todd Davidson ([id number]) Follow-Up Appointments: A follow-up appointment should be scheduled. At this point in time I explained to patient that although he is somewhat discouraged by the fact that this wound is taking longer than he would like to heal I feel like he is on the right track and that things are progressing very nicely. He has had week by week improvement in the wound status which obviously is what we are looking for. For that reason I'm going to recommend that we continue with 2 layer compression wraps as well as the Iodoflex dressing. We will see him back for reevaluation in one week. He seems somewhat  discouraged and may benefit from showing him the pictures next week of where he started to where he is currently. Electronic Signature(s) Signed: 12/30/2015 9:39:47 AM By: Worthy Keeler PA-C Entered By: Worthy Keeler on 12/29/2015 09:21:51 Todd Davidson, Todd G. (  [id number]) -------------------------------------------------------------------------------- SuperBill Details Patient Name: Todd Davidson, Todd Davidson. Date of Service: 12/29/2015 Medical Record Number: [id number] Patient Account Number: [id number] Date of Birth/Sex: [birth year] (80 y.o. Male) Treating RN: Montey Hora Primary Care Physician: Lamonte Sakai Other Clinician: Referring Physician: Lamonte Sakai Treating Physician/Extender: Melburn Hake, HOYT Weeks in Treatment: 5 Diagnosis Coding ICD-10 Codes Code Description I87.311 Chronic venous hypertension (idiopathic) with ulcer of right lower extremity Laceration of other muscle(s) and tendon(s) at lower leg level, right leg, subsequent S86.821D encounter Facility Procedures CPT4: Description Modifier Quantity Code [id number] (Facility Use Only) [phone] - [facility] 1 LEG Physician Procedures CPT4: Description Modifier Quantity Code [id number] - WC PHYS LEVEL 3 - EST PT 1 ICD-10 Description Diagnosis S86.821D Laceration of other muscle(s) and tendon(s) at lower leg level, right leg, subsequent encounter I87.311 Chronic venous hypertension  (idiopathic) with ulcer of right lower extremity Electronic Signature(s) Signed: 12/30/2015 9:39:47 AM By: Worthy Keeler PA-C Entered By: Worthy Keeler on 12/29/2015 09:22:15

## 2016-01-05 ENCOUNTER — Encounter: Payer: Medicare Other | Admitting: Internal Medicine

## 2016-01-05 DIAGNOSIS — I87311 Chronic venous hypertension (idiopathic) with ulcer of right lower extremity: Secondary | ICD-10-CM | POA: Diagnosis not present

## 2016-01-06 NOTE — Progress Notes (Signed)
Todd Davidson ([id number]) Visit Report for 01/05/2016 Arrival Information Details Patient Name: Todd Davidson. Date of Service: 01/05/2016 9:00 AM Medical Record Number: [id number] Patient Account Number: [id number] Date of Birth/Sex: 05-16-1935 (80 y.o. Male) Treating RN: Ahmed Prima Primary Care Physician: Lamonte Sakai Other Clinician: Referring Physician: Lamonte Sakai Treating Physician/Extender: Tito Dine in Treatment: 6 Visit Information History Since Last Visit All ordered tests and consults were completed: No Patient Arrived: Kasandra Knudsen Added or deleted any medications: No Arrival Time: 09:26 Any new allergies or adverse reactions: No Accompanied By: self Had a fall or experienced change in No Transfer Assistance: [employer] activities of daily living that may affect Patient Lift risk of falls: Patient Identification Verified: Yes Signs or symptoms of abuse/neglect since last No Secondary Verification Process Yes visito Completed: Hospitalized since last visit: No Patient Requires Transmission- No Pain Present Now: No Based Precautions: Patient Has Alerts: Yes Patient Alerts: DM II Electronic Signature(s) Signed: 01/05/2016 5:14:55 PM By: Alric Quan Entered By: Alric Quan on 01/05/2016 09:27:09 Todd Davidson ([id number]) -------------------------------------------------------------------------------- Encounter Discharge Information Details Patient Name: Todd Davidson Date of Service: 01/05/2016 9:00 AM Medical Record Number: [id number] Patient Account Number: [id number] Date of Birth/Sex: 08-30-1935 (80 y.o. Male) Treating RN: Ahmed Prima Primary Care Physician: Lamonte Sakai Other Clinician: Referring Physician: Lamonte Sakai Treating Physician/Extender: Tito Dine in Treatment: 6 Encounter Discharge Information Items Discharge Pain Level: 0 Discharge Condition: Stable Ambulatory Status: Cane Discharge Destination:  Home Transportation: Private Auto Accompanied By: self Schedule Follow-up Appointment: Yes Medication Reconciliation completed and provided to Patient/Care Yes Janecia Palau: Provided on Clinical Summary of Care: 01/05/2016 Form Type Recipient Paper Patient LI Electronic Signature(s) Signed: 01/05/2016 9:59:25 AM By: Ruthine Dose Entered By: Ruthine Dose on 01/05/2016 09:59:25 Todd Davidson ([id number]) -------------------------------------------------------------------------------- Lower Extremity Assessment Details Patient Name: Todd Davidson Date of Service: 01/05/2016 9:00 AM Medical Record Number: [id number] Patient Account Number: [id number] Date of Birth/Sex: 02-Feb-1936 (80 y.o. Male) Treating RN: Ahmed Prima Primary Care Physician: Lamonte Sakai Other Clinician: Referring Physician: Lamonte Sakai Treating Physician/Extender: Ricard Dillon Weeks in Treatment: 6 Vascular Assessment Pulses: Posterior Tibial Dorsalis Pedis Palpable: [Right:Yes] Extremity colors, hair growth, and conditions: Extremity Color: [Right:Hyperpigmented] Temperature of Extremity: [Right:Warm] Capillary Refill: [Right:< 3 seconds] Electronic Signature(s) Signed: 01/05/2016 5:14:55 PM By: Alric Quan Entered By: Alric Quan on 01/05/2016 09:29:20 Todd Davidson ([id number]) -------------------------------------------------------------------------------- Multi Wound Chart Details Patient Name: Todd Davidson Date of Service: 01/05/2016 9:00 AM Medical Record Number: [id number] Patient Account Number: [id number] Date of Birth/Sex: [birth year] (80 y.o. Male) Treating RN: Ahmed Prima Primary Care Physician: Lamonte Sakai Other Clinician: Referring Physician: Lamonte Sakai Treating Physician/Extender: Ricard Dillon Weeks in Treatment: 6 Vital Signs Height(in): 72 Pulse(bpm): 58 Weight(lbs): 160 Blood Pressure 134/59 (mmHg): Body Mass Index(BMI): 22 Temperature(F):  97.5 Respiratory Rate 18 (breaths/min): Photos: [1:No Photos] [N/A:N/A] Wound Location: [1:Right Lower Leg - Lateral N/A] Wounding Event: [1:Trauma] [N/A:N/A] Primary Etiology: [1:Diabetic Wound/Ulcer of N/A the Lower Extremity] Secondary Etiology: [1:Trauma, Other] [N/A:N/A] Comorbid History: [1:Cataracts, Asthma, Sleep N/A Apnea, Coronary Artery Disease, Hypertension, Type II Diabetes, Osteoarthritis, Neuropathy] Date Acquired: [1:10/20/2015] [N/A:N/A] Weeks of Treatment: [1:6] [N/A:N/A] Wound Status: [1:Open] [N/A:N/A] Measurements L x W x D 0.1x0.1x0.1 [N/A:N/A] (cm) Area (cm) : [1:0.008] [N/A:N/A] Volume (cm) : [1:0.001] [N/A:N/A] % Reduction in Area: [1:99.90%] [N/A:N/A] % Reduction in Volume: 99.90% [N/A:N/A] Classification: [1:Grade 1] [N/A:N/A] Exudate Amount: [1:Large] [N/A:N/A] Exudate Type: [1:Serosanguineous] [N/A:N/A] Exudate Color: [1:red, brown] [N/A:N/A] Wound Margin: [1:Distinct, outline attached N/A] Granulation  Amount: [1:Small (1-33%)] [N/A:N/A] Granulation Quality: [1:Pink] [N/A:N/A] Necrotic Amount: [1:Large (67-100%)] [N/A:N/A] Necrotic Tissue: [1:Eschar, Adherent Slough N/A] Exposed Structures: [N/A:N/A] Fascia: No Fat: No Tendon: No Muscle: No Joint: No Bone: No Limited to Skin Breakdown Epithelialization: Small (1-33%) N/A N/A Periwound Skin Texture: Scarring: Yes N/A N/A Periwound Skin Moist: Yes N/A N/A Moisture: Periwound Skin Color: Erythema: Yes N/A N/A Erythema Location: Circumferential N/A N/A Temperature: No Abnormality N/A N/A Tenderness on No N/A N/A Palpation: Wound Preparation: Ulcer Cleansing: Other: N/A N/A soap and water Topical Anesthetic Applied: Other: lidocaine 4% Treatment Notes Electronic Signature(s) Signed: 01/05/2016 5:14:55 PM By: Alric Quan Entered By: Alric Quan on 01/05/2016 09:37:28 Todd Davidson  ([id number]) -------------------------------------------------------------------------------- [place] Details Patient Name: Todd Davidson Date of Service: 01/05/2016 9:00 AM Medical Record Number: [id number] Patient Account Number: [id number] Date of Birth/Sex: Mar 01, 1936 (80 y.o. Male) Treating RN: Ahmed Prima Primary Care Physician: Lamonte Sakai Other Clinician: Referring Physician: Lamonte Sakai Treating Physician/Extender: Tito Dine in Treatment: 6 Active Inactive Abuse / Safety / Falls / Self Care Management Nursing Diagnoses: Potential for falls Goals: Patient will remain injury free Date Initiated: 11/24/2015 Goal Status: Active Interventions: Assess fall risk on admission and as needed Notes: Nutrition Nursing Diagnoses: Imbalanced nutrition Impaired glucose control: actual or potential Potential for alteratiion in Nutrition/Potential for imbalanced nutrition Goals: Patient/caregiver agrees to and verbalizes understanding of need to use nutritional supplements and/or vitamins as prescribed Date Initiated: 11/24/2015 Goal Status: Active Patient/caregiver verbalizes understanding of need to maintain therapeutic glucose control per primary care physician Date Initiated: 11/24/2015 Goal Status: Active Patient/caregiver will maintain therapeutic glucose control Date Initiated: 11/24/2015 Goal Status: Active Interventions: Assess patient nutrition upon admission and as needed per policy SALIM, FORERO ([id number]) Provide education on elevated blood sugars and impact on wound healing Notes: Orientation to the Wound Care Program Nursing Diagnoses: Knowledge deficit related to the wound healing center program Goals: Patient/caregiver will verbalize understanding of the [facility] Date Initiated: 11/24/2015 Goal Status: Active Interventions: Provide education on orientation to the wound center Notes: Pain, Acute or  Chronic Nursing Diagnoses: Pain, acute or chronic: actual or potential Potential alteration in comfort, pain Goals: Patient will verbalize adequate pain control and receive pain control interventions during procedures as needed Date Initiated: 11/24/2015 Goal Status: Active Patient/caregiver will verbalize adequate pain control between visits Date Initiated: 11/24/2015 Goal Status: Active Patient/caregiver will verbalize comfort level met Date Initiated: 11/24/2015 Goal Status: Active Interventions: Assess comfort goal upon admission Complete pain assessment as per visit requirements Notes: Wound/Skin Impairment Nursing Diagnoses: Impaired tissue integrity Runyon, Yahya Darnell Level ([id number]) Goals: Ulcer/skin breakdown will have a volume reduction of 30% by week 4 Date Initiated: 11/24/2015 Goal Status: Active Ulcer/skin breakdown will have a volume reduction of 50% by week 8 Date Initiated: 11/24/2015 Goal Status: Active Ulcer/skin breakdown will have a volume reduction of 80% by week 12 Date Initiated: 11/24/2015 Goal Status: Active Interventions: Assess patient/caregiver ability to perform ulcer/skin care regimen upon admission and as needed Notes: Electronic Signature(s) Signed: 01/05/2016 5:14:55 PM By: Alric Quan Entered By: Alric Quan on 01/05/2016 09:37:22 Todd Davidson ([id number]) -------------------------------------------------------------------------------- Pain Assessment Details Patient Name: Todd Davidson Date of Service: 01/05/2016 9:00 AM Medical Record Number: [id number] Patient Account Number: [id number] Date of Birth/Sex: Mar 29, 1935 (80 y.o. Male) Treating RN: Ahmed Prima Primary Care Physician: Lamonte Sakai Other Clinician: Referring Physician: Lamonte Sakai Treating Physician/Extender: Ricard Dillon Weeks in Treatment: 6 Active Problems Location of Pain Severity and  Description of Pain Patient Has Paino No Site Locations With Dressing  Change: No Pain Management and Medication Current Pain Management: Electronic Signature(s) Signed: 01/05/2016 5:14:55 PM By: Alric Quan Entered By: Alric Quan on 01/05/2016 09:27:16 Todd Davidson ([id number]) -------------------------------------------------------------------------------- Patient/Caregiver Education Details Patient Name: Todd Davidson Date of Service: 01/05/2016 9:00 AM Medical Record Number: [id number] Patient Account Number: [id number] Date of Birth/Gender: Jan 10, 1936 (79 y.o. Male) Treating RN: Ahmed Prima Primary Care Physician: Lamonte Sakai Other Clinician: Referring Physician: Lamonte Sakai Treating Physician/Extender: Tito Dine in Treatment: 6 Education Assessment Education Provided To: Patient Education Topics Provided Wound/Skin Impairment: Handouts: Other: change dressing as ordered Methods: Demonstration, Explain/Verbal Responses: State content correctly Electronic Signature(s) Signed: 01/05/2016 5:14:55 PM By: Alric Quan Entered By: Alric Quan on 01/05/2016 09:38:46 Todd Davidson ([id number]) -------------------------------------------------------------------------------- Wound Assessment Details Patient Name: Todd Davidson Date of Service: 01/05/2016 9:00 AM Medical Record Number: [id number] Patient Account Number: [id number] Date of Birth/Sex: 05-28-1935 (80 y.o. Male) Treating RN: Ahmed Prima Primary Care Physician: Lamonte Sakai Other Clinician: Referring Physician: Lamonte Sakai Treating Physician/Extender: Ricard Dillon Weeks in Treatment: 6 Wound Status Wound Number: 1 Primary Diabetic Wound/Ulcer of the Lower Etiology: Extremity Wound Location: Right Lower Leg - Lateral Secondary Trauma, Other Wounding Event: Trauma Etiology: Date Acquired: 10/20/2015 Wound Open Weeks Of Treatment: 6 Status: Clustered Wound: No Comorbid Cataracts, Asthma, Sleep Apnea, History: Coronary Artery  Disease, Hypertension, Type II Diabetes, Osteoarthritis, Neuropathy Photos Photo Uploaded By: Alric Quan on 01/05/2016 09:40:52 Wound Measurements Length: (cm) 0.1 Width: (cm) 0.1 Depth: (cm) 0.1 Area: (cm) 0.008 Volume: (cm) 0.001 % Reduction in Area: 99.9% % Reduction in Volume: 99.9% Epithelialization: Small (1-33%) Tunneling: No Undermining: No Wound Description Classification: Grade 1 Wound Margin: Distinct, outline attached Exudate Amount: Large Exudate Type: Serosanguineous Exudate Color: red, brown Foul Odor After Cleansing: No Wound Bed Granulation Amount: Small (1-33%) Exposed Structure Brummet, Issachar GMarland Kitchen ([id number]) Granulation Quality: Pink Fascia Exposed: No Necrotic Amount: Large (67-100%) Fat Layer Exposed: No Necrotic Quality: Eschar, Adherent Slough Tendon Exposed: No Muscle Exposed: No Joint Exposed: No Bone Exposed: No Limited to Skin Breakdown Periwound Skin Texture Texture Color No Abnormalities Noted: No No Abnormalities Noted: No Scarring: Yes Erythema: Yes Erythema Location: Circumferential Moisture No Abnormalities Noted: No Temperature / Pain Moist: Yes Temperature: No Abnormality Wound Preparation Ulcer Cleansing: Other: soap and water, Topical Anesthetic Applied: Other: lidocaine 4%, Treatment Notes Wound #1 (Right, Lateral Lower Leg) 1. Cleansed with: Clean wound with Normal Saline Cleanse wound with antibacterial soap and water 2. Anesthetic Topical Lidocaine 4% cream to wound bed prior to debridement 3. Peri-wound Care: Skin Prep 4. Dressing Applied: Prisma Ag 5. Secondary Dressing Applied Bordered Foam Dressing Dry Gauze Electronic Signature(s) Signed: 01/05/2016 5:14:55 PM By: Alric Quan Entered By: Alric Quan on 01/05/2016 09:35:36 Todd Davidson ([id number]) -------------------------------------------------------------------------------- [place] Details Patient Name: Todd Davidson Date of  Service: 01/05/2016 9:00 AM Medical Record Number: [id number] Patient Account Number: [id number] Date of Birth/Sex: [birth year] (80 y.o. Male) Treating RN: Ahmed Prima Primary Care Physician: Lamonte Sakai Other Clinician: Referring Physician: Lamonte Sakai Treating Physician/Extender: Ricard Dillon Weeks in Treatment: 6 Vital Signs Time Taken: 09:27 Temperature (F): 97.5 Height (in): 72 Pulse (bpm): 58 Weight (lbs): 160 Respiratory Rate (breaths/min): 18 Body Mass Index (BMI): 21.7 Blood Pressure (mmHg): 134/59 Reference Range: 80 - 120 mg / dl Electronic Signature(s) Signed: 01/05/2016 5:14:55 PM By: Alric Quan Entered By: Alric Quan on 01/05/2016 09:27:42

## 2016-01-06 NOTE — Progress Notes (Signed)
Todd Davidson, Todd Davidson ([id number]) Visit Report for 01/05/2016 Chief Complaint Document Details Patient Name: Todd Davidson, Todd Davidson. Date of Service: 01/05/2016 9:00 AM Medical Record Patient Account Number: [id number] Number: Treating RN: Ahmed Prima 11/17/1935 (79 y.o. Other Clinician: Date of Birth/Sex: Male) Treating ROBSON, [place] Primary Care Physician: Lamonte Sakai Physician/Extender: G Referring Physician: Orland Mustard in Treatment: 6 Information Obtained from: Patient Chief Complaint Patient is here for a chronic wound on his right lower leg Electronic Signature(s) Signed: 01/06/2016 8:01:59 AM By: Linton Ham MD Entered By: Linton Ham on 01/05/2016 10:04:55 Todd Davidson ([id number]) -------------------------------------------------------------------------------- Debridement Details Patient Name: Todd Davidson Date of Service: 01/05/2016 9:00 AM Medical Record Patient Account Number: [id number] Number: Treating RN: Ahmed Prima 1935/10/03 (79 y.o. Other Clinician: Date of Birth/Sex: Male) Treating ROBSON, [place] Primary Care Physician: Lamonte Sakai Physician/Extender: G Referring Physician: Orland Mustard in Treatment: 6 Debridement Performed for Wound #1 Right,Lateral Lower Leg Assessment: Performed By: Physician Ricard Dillon, MD Debridement: Debridement Pre-procedure Yes - 09:47 Verification/Time Out Taken: Start Time: 09:48 Pain Control: Lidocaine 4% Topical Solution Level: Skin/Subcutaneous Tissue Total Area Debrided (L x 0.1 (cm) x 0.1 (cm) = 0.01 (cm) W): Tissue and other Viable, Non-Viable material debrided: Instrument: Curette Bleeding: Minimum Hemostasis Achieved: Pressure End Time: 09:50 Procedural Pain: 0 Post Procedural Pain: 0 Response to Treatment: Procedure was tolerated well Post Debridement Measurements of Total Wound Length: (cm) 0.1 Width: (cm) 0.1 Depth: (cm) 0.1 Volume: (cm)  0.001 Character of Wound/Ulcer Post Stable Debridement: Severity of Tissue Post Debridement: Fat layer exposed Post Procedure Diagnosis Same as Pre-procedure Electronic Signature(s) Signed: 01/05/2016 5:14:55 PM By: Alric Quan Signed: 01/06/2016 8:01:59 AM By: Linton Ham MD Todd Davidson ([id number]) Entered By: Linton Ham on 01/05/2016 10:04:42 Todd Davidson ([id number]) -------------------------------------------------------------------------------- HPI Details Patient Name: Todd Davidson Date of Service: 01/05/2016 9:00 AM Medical Record Patient Account Number: [id number] Number: Treating RN: Ahmed Prima 09/28/1935 (79 y.o. Other Clinician: Date of Birth/Sex: Male) Treating ROBSON, [place] Primary Care Physician: Lamonte Sakai Physician/Extender: G Referring Physician: Lamonte Sakai Weeks in Treatment: 6 History of Present Illness HPI Description: 11/24/15; this patient is a 80 year old man with type 2 diabetes on insulin. While mowing the lawn on August 1 the patient traumatized his right leg. He was seen in the ER and had 8 sutures. He tells me the sutures were removed all over my understanding the wound at already dehisced. He has been using peroxide and Betadine on the wound. There is a fair amount of pain but no drainage. He probably has diabetic neuropathy. He has no known history of PAD. ABIs in this clinic were 1.02 on the right and 1.11 on the left. 12/01/15; the patient's wound is down 1 cm in width. Surface still required debridement 12/08/15; patient's wound continues to improve although he continues to require debridement. I have been using Prisma I will to Iodoflex. Patient has 2 superficial open areas over his patella on the right 12/15/15; patient's wound appeared healthier today. I been using Iodoflex for one week. Areas on his right anterior lateral leg. Superficial excoriations on his patella appears stable I have not really done  anything particular about this 12/22/15 patient presents today for a follow-up visit concerning his ongoing right lower extremity open wound. We have been using Iodoflex which she is tolerating well at this point. Subsequently he has also been tolerating the 2 layer compression wrapping that we have recommended. Subsequently his right knee where he had some  superficial excoriations does seem to be healing this is scabbed over at this point in time and shows no sign of infection which is excellent news. He tells me that he really is not having any significant pain in fact he rates his be a 0 out of 10 at this point in time. 12/29/15 patient presents today for follow-up concerning the right lower extremity open wound. Currently again we have been utilizing the Iodoflex dressing which he continues to do well with. The 2 layer compression wrapping he is tolerating but he wonders why we have to wrap his entire leg for such a small wound. Subsequently he was concerned that today he should've actually been doing better and in fact was hoping this would be healed when it was unwrapped. Obviously it was not. This is progressing nicely and my opinion when looking at the charts currently. I explained to patient that he does have to be somewhat patient with this process as again wounds do not heal often as fast as we would like them to move were dealing with the chronic nature of this type of wound. However we are seeing continual improvement week by week. 01/05/16; patient arrives today quite irritated about her continued use of compression. He tells me he fell off of woodpile he has a continued open area in the middle of scar tissue on his right knee. He had this when I last saw him however he is not really seeking attention for this. Wound we are looking at is on his lateral right calf Electronic Signature(s) Signed: 01/06/2016 8:01:59 AM By: Linton Ham MD Todd Davidson ([id number]) Entered By:  Linton Ham on 01/05/2016 10:06:18 Todd Davidson ([id number]) -------------------------------------------------------------------------------- Physical Exam Details Patient Name: Todd Davidson Date of Service: 01/05/2016 9:00 AM Medical Record Patient Account Number: [id number] Number: Treating RN: Ahmed Prima [birth year] (79 y.o. Other Clinician: Date of Birth/Sex: Male) Treating ROBSON, [place] Primary Care Physician: Lamonte Sakai Physician/Extender: G Referring Physician: Lamonte Sakai Weeks in Treatment: 6 Constitutional Sitting or standing Blood Pressure is within target range for patient.. Pulse regular and within target range for patient.Marland Kitchen Respirations regular, non-labored and within target range.. Temperature is normal and within the target range for the patient.. Patient's appearance is neat and clean. Appears in no acute distress. Well nourished and well developed.. Notes Wound exam; the wound was debrided with a curet of surface eschar. No hemostasis required. Underneath this the wound is just about healed only a small open area remains. Electronic Signature(s) Signed: 01/06/2016 8:01:59 AM By: Linton Ham MD Entered By: Linton Ham on 01/05/2016 10:07:27 Todd Davidson ([id number]) -------------------------------------------------------------------------------- Physician Orders Details Patient Name: Todd Davidson Date of Service: 01/05/2016 9:00 AM Medical Record Patient Account Number: [id number] Number: Treating RN: Ahmed Prima March 18, 1936 (79 y.o. Other Clinician: Date of Birth/Sex: Male) Treating ROBSON, [place] Primary Care Physician: Lamonte Sakai Physician/Extender: G Referring Physician: Orland Mustard in Treatment: 6 Verbal / Phone Orders: Yes Clinician: Carolyne Fiscal, Debi Read Back and Verified: Yes Diagnosis Coding Wound Cleansing Wound #1 Right,Lateral Lower Leg o Clean wound with Normal Saline. o May  Shower, gently pat wound dry prior to applying new dressing. Anesthetic Wound #1 Right,Lateral Lower Leg o Topical Lidocaine 4% cream applied to wound bed prior to debridement Skin Barriers/Peri-Wound Care Wound #1 Right,Lateral Lower Leg o Skin Prep Primary Wound Dressing Wound #1 Right,Lateral Lower Leg o Prisma Ag - moisten with saline Secondary Dressing Wound #1 Right,Lateral Lower Leg o Dry Gauze o Boardered  Foam Dressing Dressing Change Frequency Wound #1 Right,Lateral Lower Leg o Change dressing every other day. Follow-up Appointments Wound #1 Right,Lateral Lower Leg o Return Appointment in 1 week. Edema Control Wound #1 Right,Lateral Lower Leg o Elevate legs to the level of the heart and pump ankles as often as possible Davidson, Todd G. ([id number]) Additional Orders / Instructions Wound #1 Right,Lateral Lower Leg o Increase protein intake. Electronic Signature(s) Signed: 01/05/2016 5:14:55 PM By: Alric Quan Signed: 01/06/2016 8:01:59 AM By: Linton Ham MD Entered By: Alric Quan on 01/05/2016 09:51:22 Todd Davidson ([id number]) -------------------------------------------------------------------------------- Problem List Details Patient Name: Todd Davidson Date of Service: 01/05/2016 9:00 AM Medical Record Patient Account Number: [id number] Number: Treating RN: Ahmed Prima [birth year] (79 y.o. Other Clinician: Date of Birth/Sex: Male) Treating ROBSON, [place] Primary Care Physician: Lamonte Sakai Physician/Extender: G Referring Physician: Lamonte Sakai Weeks in Treatment: 6 Active Problems ICD-10 Encounter Code Description Active Date Diagnosis I87.311 Chronic venous hypertension (idiopathic) with ulcer of 11/24/2015 Yes right lower extremity S86.821D Laceration of other muscle(s) and tendon(s) at lower leg 11/24/2015 Yes level, right leg, subsequent encounter Inactive Problems Resolved Problems Electronic  Signature(s) Signed: 01/06/2016 8:01:59 AM By: Linton Ham MD Entered By: Linton Ham on 01/05/2016 10:04:30 Todd Davidson ([id number]) -------------------------------------------------------------------------------- Progress Note Details Patient Name: Todd Davidson Date of Service: 01/05/2016 9:00 AM Medical Record Patient Account Number: [id number] Number: Treating RN: Ahmed Prima 10/04/1935 (79 y.o. Other Clinician: Date of Birth/Sex: Male) Treating ROBSON, [place] Primary Care Physician: Lamonte Sakai Physician/Extender: G Referring Physician: Orland Mustard in Treatment: 6 Subjective Chief Complaint Information obtained from Patient Patient is here for a chronic wound on his right lower leg History of Present Illness (HPI) 11/24/15; this patient is a 80 year old man with type 2 diabetes on insulin. While mowing the lawn on August 1 the patient traumatized his right leg. He was seen in the ER and had 8 sutures. He tells me the sutures were removed all over my understanding the wound at already dehisced. He has been using peroxide and Betadine on the wound. There is a fair amount of pain but no drainage. He probably has diabetic neuropathy. He has no known history of PAD. ABIs in this clinic were 1.02 on the right and 1.11 on the left. 12/01/15; the patient's wound is down 1 cm in width. Surface still required debridement 12/08/15; patient's wound continues to improve although he continues to require debridement. I have been using Prisma I will to Iodoflex. Patient has 2 superficial open areas over his patella on the right 12/15/15; patient's wound appeared healthier today. I been using Iodoflex for one week. Areas on his right anterior lateral leg. Superficial excoriations on his patella appears stable I have not really done anything particular about this 12/22/15 patient presents today for a follow-up visit concerning his ongoing right lower extremity  open wound. We have been using Iodoflex which she is tolerating well at this point. Subsequently he has also been tolerating the 2 layer compression wrapping that we have recommended. Subsequently his right knee where he had some superficial excoriations does seem to be healing this is scabbed over at this point in time and shows no sign of infection which is excellent news. He tells me that he really is not having any significant pain in fact he rates his be a 0 out of 10 at this point in time. 12/29/15 patient presents today for follow-up concerning the right lower extremity open wound. Currently again we have been  utilizing the Iodoflex dressing which he continues to do well with. The 2 layer compression wrapping he is tolerating but he wonders why we have to wrap his entire leg for such a small wound. Subsequently he was concerned that today he should've actually been doing better and in fact was hoping this would be healed when it was unwrapped. Obviously it was not. This is progressing nicely and my opinion when looking at the charts currently. I explained to patient that he does have to be somewhat patient with this process as again wounds do not heal often as fast as we would like them to move were dealing with the chronic nature of this type of wound. However we are seeing continual improvement week by week. 01/05/16; patient arrives today quite irritated about her continued use of compression. He tells me he fell off of woodpile he has a continued open area in the middle of scar tissue on his right knee. He had this when I last saw him however he is not really seeking attention for this. Wound we are looking at is on his lateral Todd Davidson, Todd G. ([id number]) right calf Objective Constitutional Sitting or standing Blood Pressure is within target range for patient.. Pulse regular and within target range for patient.Marland Kitchen Respirations regular, non-labored and within target range.. Temperature  is normal and within the target range for the patient.. Patient's appearance is neat and clean. Appears in no acute distress. Well nourished and well developed.. Vitals Time Taken: 9:27 AM, Height: 72 in, Weight: 160 lbs, BMI: 21.7, Temperature: 97.5 F, Pulse: 58 bpm, Respiratory Rate: 18 breaths/min, Blood Pressure: 134/59 mmHg. General Notes: Wound exam; the wound was debrided with a curet of surface eschar. No hemostasis required. Underneath this the wound is just about healed only a small open area remains. Integumentary (Hair, Skin) Wound #1 status is Open. Original cause of wound was Trauma. The wound is located on the Right,Lateral Lower Leg. The wound measures 0.1cm length x 0.1cm width x 0.1cm depth; 0.008cm^2 area and 0.001cm^3 volume. The wound is limited to skin breakdown. There is no tunneling or undermining noted. There is a large amount of serosanguineous drainage noted. The wound margin is distinct with the outline attached to the wound base. There is small (1-33%) pink granulation within the wound bed. There is a large (67-100%) amount of necrotic tissue within the wound bed including Eschar and Adherent Slough. The periwound skin appearance exhibited: Scarring, Moist, Erythema. The surrounding wound skin color is noted with erythema which is circumferential. Periwound temperature was noted as No Abnormality. Assessment Active Problems ICD-10 I87.311 - Chronic venous hypertension (idiopathic) with ulcer of right lower extremity S86.821D - Laceration of other muscle(s) and tendon(s) at lower leg level, right leg, subsequent encounter Todd Davidson, Todd Davidson. ([id number]) Procedures Wound #1 Wound #1 is a Diabetic Wound/Ulcer of the Lower Extremity located on the Right,Lateral Lower Leg . There was a Skin/Subcutaneous Tissue Debridement [id number]) debridement with total area of 0.01 sq cm performed by Ricard Dillon, MD. with the following instrument(s): Curette to remove Viable  and Non-Viable tissue/material after achieving pain control using Lidocaine 4% Topical Solution. A time out was conducted at 09:47, prior to the start of the procedure. A Minimum amount of bleeding was controlled with Pressure. The procedure was tolerated well with a pain level of 0 throughout and a pain level of 0 following the procedure. Post Debridement Measurements: 0.1cm length x 0.1cm width x 0.1cm depth; 0.001cm^3 volume. Character of Wound/Ulcer Post Debridement  is stable. Severity of Tissue Post Debridement is: Fat layer exposed. Post procedure Diagnosis Wound #1: Same as Pre-Procedure Plan Wound Cleansing: Wound #1 Right,Lateral Lower Leg: Clean wound with Normal Saline. May Shower, gently pat wound dry prior to applying new dressing. Anesthetic: Wound #1 Right,Lateral Lower Leg: Topical Lidocaine 4% cream applied to wound bed prior to debridement Skin Barriers/Peri-Wound Care: Wound #1 Right,Lateral Lower Leg: Skin Prep Primary Wound Dressing: Wound #1 Right,Lateral Lower Leg: Prisma Ag - moisten with saline Secondary Dressing: Wound #1 Right,Lateral Lower Leg: Dry Gauze Boardered Foam Dressing Dressing Change Frequency: Wound #1 Right,Lateral Lower Leg: Change dressing every other day. Follow-up Appointments: Wound #1 Right,Lateral Lower Leg: Return Appointment in 1 week. Edema Control: Wound #1 Right,Lateral Lower Leg: Elevate legs to the level of the heart and pump ankles as often as possible Additional Orders / InstructionsOSTIN, Todd Davidson ([id number]) Wound #1 Right,Lateral Lower Leg: Increase protein intake. #1 we have been using Iodoflex to this wound I don't think this is necessary. I changed him to [facility] with a border foam which should result in resolution of the area on his right anterior lateral calf Electronic Signature(s) Signed: 01/06/2016 8:01:59 AM By: Linton Ham MD Entered By: Linton Ham on 01/05/2016 10:08:16 Todd Davidson  ([id number]) -------------------------------------------------------------------------------- SuperBill Details Patient Name: Todd Davidson Date of Service: 01/05/2016 Medical Record Patient Account Number: [id number] Number: Treating RN: Ahmed Prima 1935/11/30 (79 y.o. Other Clinician: Date of Birth/Sex: Male) Treating ROBSON, [place] Primary Care Physician: Lamonte Sakai Physician/Extender: G Referring Physician: Lamonte Sakai Weeks in Treatment: 6 Diagnosis Coding ICD-10 Codes Code Description I87.311 Chronic venous hypertension (idiopathic) with ulcer of right lower extremity Laceration of other muscle(s) and tendon(s) at lower leg level, right leg, subsequent S86.821D encounter Facility Procedures CPT4 Code Description: [id number] - DEB SUBQ TISSUE 20 SQ CM/< ICD-10 Description Diagnosis I87.311 Chronic venous hypertension (idiopathic) with ulcer Modifier: of right lower Quantity: 1 extremity Physician Procedures CPT4 Code Description: [id number] - WC PHYS SUBQ TISS 20 SQ CM ICD-10 Description Diagnosis I87.311 Chronic venous hypertension (idiopathic) with ulcer Modifier: of right lower Quantity: 1 extremity Electronic Signature(s) Signed: 01/06/2016 8:01:59 AM By: Linton Ham MD Entered By: Linton Ham on 01/05/2016 10:08:47

## 2016-01-12 ENCOUNTER — Encounter: Payer: Medicare Other | Admitting: Internal Medicine

## 2016-01-12 DIAGNOSIS — I87311 Chronic venous hypertension (idiopathic) with ulcer of right lower extremity: Secondary | ICD-10-CM | POA: Diagnosis not present

## 2016-01-14 NOTE — Progress Notes (Signed)
AMARR, PLINE ([id number]) Visit Report for 01/12/2016 Chief Complaint Document Details Patient Name: Todd Davidson, Todd Davidson. Date of Service: 01/12/2016 8:45 AM Medical Record Patient Account Number: [id number] Number: Treating RN: Ahmed Prima April 18, 1935 (79 y.o. Other Clinician: Date of Birth/Sex: Male) Treating ROBSON, [place] Primary Care Physician: Lamonte Sakai Physician/Extender: G Referring Physician: Orland Mustard in Treatment: 7 Information Obtained from: Patient Chief Complaint Patient is here for a chronic wound on his right lower leg Electronic Signature(s) Signed: 01/12/2016 5:12:34 PM By: Linton Ham MD Entered By: Linton Ham on 01/12/2016 09:55:06 Todd Davidson ([id number]) -------------------------------------------------------------------------------- HPI Details Patient Name: Todd Davidson Date of Service: 01/12/2016 8:45 AM Medical Record Patient Account Number: [id number] Number: Treating RN: Ahmed Prima Jun 13, 1935 (79 y.o. Other Clinician: Date of Birth/Sex: Male) Treating ROBSON, [place] Primary Care Physician: Lamonte Sakai Physician/Extender: G Referring Physician: Lamonte Sakai Weeks in Treatment: 7 History of Present Illness HPI Description: 11/24/15; this patient is a 80 year old man with type 2 diabetes on insulin. While mowing the lawn on August 1 the patient traumatized his right leg. He was seen in the ER and had 8 sutures. He tells me the sutures were removed all over my understanding the wound at already dehisced. He has been using peroxide and Betadine on the wound. There is a fair amount of pain but no drainage. He probably has diabetic neuropathy. He has no known history of PAD. ABIs in this clinic were 1.02 on the right and 1.11 on the left. 12/01/15; the patient's wound is down 1 cm in width. Surface still required debridement 12/08/15; patient's wound continues to improve although he continues to require  debridement. I have been using Prisma I will to Iodoflex. Patient has 2 superficial open areas over his patella on the right 12/15/15; patient's wound appeared healthier today. I been using Iodoflex for one week. Areas on his right anterior lateral leg. Superficial excoriations on his patella appears stable I have not really done anything particular about this 12/22/15 patient presents today for a follow-up visit concerning his ongoing right lower extremity open wound. We have been using Iodoflex which she is tolerating well at this point. Subsequently he has also been tolerating the 2 layer compression wrapping that we have recommended. Subsequently his right knee where he had some superficial excoriations does seem to be healing this is scabbed over at this point in time and shows no sign of infection which is excellent news. He tells me that he really is not having any significant pain in fact he rates his be a 0 out of 10 at this point in time. 12/29/15 patient presents today for follow-up concerning the right lower extremity open wound. Currently again we have been utilizing the Iodoflex dressing which he continues to do well with. The 2 layer compression wrapping he is tolerating but he wonders why we have to wrap his entire leg for such a small wound. Subsequently he was concerned that today he should've actually been doing better and in fact was hoping this would be healed when it was unwrapped. Obviously it was not. This is progressing nicely and my opinion when looking at the charts currently. I explained to patient that he does have to be somewhat patient with this process as again wounds do not heal often as fast as we would like them to move were dealing with the chronic nature of this type of wound. However we are seeing continual improvement week by week. 01/05/16; patient arrives today quite irritated  about her continued use of compression. He tells me he fell off of woodpile he  has a continued open area in the middle of scar tissue on his right knee. He had this when I last saw him however he is not really seeking attention for this. Wound we are looking at is on his lateral right calf 01/12/16; his wound is totally healed today. Several walls eschars over the scar tissue on his right knee. However he does not want me to try and deal with this. Todd Davidson, Todd Davidson ([id number]) Electronic Signature(s) Signed: 01/12/2016 5:12:34 PM By: Linton Ham MD Entered By: Linton Ham on 01/12/2016 09:56:31 Todd Davidson ([id number]) -------------------------------------------------------------------------------- Physical Exam Details Patient Name: Todd Davidson Date of Service: 01/12/2016 8:45 AM Medical Record Patient Account Number: [id number] Number: Treating RN: Ahmed Prima 1935-09-08 (79 y.o. Other Clinician: Date of Birth/Sex: Male) Treating ROBSON, [place] Primary Care Physician: Lamonte Sakai Physician/Extender: G Referring Physician: Lamonte Sakai Weeks in Treatment: 7 Constitutional Sitting or standing Blood Pressure is within target range for patient.. Pulse regular and within target range for patient.Marland Kitchen Respirations regular, non-labored and within target range.. Temperature is normal and within the target range for the patient.. Notes Wound exam; wound is totally epithelialized and healed. No surrounding tenderness no evidence of infection. He has several superficial eschars over his right patella in the middle of scar tissue however he does not want me to deal with this. Really looks like it needs some superficial debridement Electronic Signature(s) Signed: 01/12/2016 5:12:34 PM By: Linton Ham MD Entered By: Linton Ham on 01/12/2016 09:57:49 Todd Davidson ([id number]) -------------------------------------------------------------------------------- Physician Orders Details Patient Name: Todd Davidson Date of Service: 01/12/2016  8:45 AM Medical Record Patient Account Number: [id number] Number: Treating RN: Ahmed Prima Mar 08, 1936 (79 y.o. Other Clinician: Date of Birth/Sex: Male) Treating ROBSON, [place] Primary Care Physician: Lamonte Sakai Physician/Extender: G Referring Physician: Orland Mustard in Treatment: 7 Verbal / Phone Orders: Yes Clinician: Carolyne Fiscal, Debi Read Back and Verified: Yes Diagnosis Coding Discharge From [facility] Services o Discharge from [place] - Please call our office if you have any questions or concerns. Electronic Signature(s) Signed: 01/12/2016 5:12:34 PM By: Linton Ham MD Signed: 01/13/2016 5:37:51 PM By: Alric Quan Entered By: Alric Quan on 01/12/2016 09:10:49 Todd Davidson ([id number]) -------------------------------------------------------------------------------- Problem List Details Patient Name: Todd Davidson Date of Service: 01/12/2016 8:45 AM Medical Record Patient Account Number: [id number] Number: Treating RN: Ahmed Prima 12/06/1935 (79 y.o. Other Clinician: Date of Birth/Sex: Male) Treating ROBSON, [place] Primary Care Physician: Lamonte Sakai Physician/Extender: G Referring Physician: Lamonte Sakai Weeks in Treatment: 7 Active Problems ICD-10 Encounter Code Description Active Date Diagnosis I87.311 Chronic venous hypertension (idiopathic) with ulcer of 11/24/2015 Yes right lower extremity S86.821D Laceration of other muscle(s) and tendon(s) at lower leg 11/24/2015 Yes level, right leg, subsequent encounter Inactive Problems Resolved Problems Electronic Signature(s) Signed: 01/12/2016 5:12:34 PM By: Linton Ham MD Entered By: Linton Ham on 01/12/2016 09:54:54 Todd Davidson ([id number]) -------------------------------------------------------------------------------- Progress Note Details Patient Name: Todd Davidson Date of Service: 01/12/2016 8:45 AM Medical Record Patient Account Number:  [id number] Number: Treating RN: Ahmed Prima 03/05/1936 (79 y.o. Other Clinician: Date of Birth/Sex: Male) Treating ROBSON, [place] Primary Care Physician: Lamonte Sakai Physician/Extender: G Referring Physician: Orland Mustard in Treatment: 7 Subjective Chief Complaint Information obtained from Patient Patient is here for a chronic wound on his right lower leg History of Present Illness (HPI) 11/24/15; this patient is a 80 year old man  with type 2 diabetes on insulin. While mowing the lawn on August 1 the patient traumatized his right leg. He was seen in the ER and had 8 sutures. He tells me the sutures were removed all over my understanding the wound at already dehisced. He has been using peroxide and Betadine on the wound. There is a fair amount of pain but no drainage. He probably has diabetic neuropathy. He has no known history of PAD. ABIs in this clinic were 1.02 on the right and 1.11 on the left. 12/01/15; the patient's wound is down 1 cm in width. Surface still required debridement 12/08/15; patient's wound continues to improve although he continues to require debridement. I have been using Prisma I will to Iodoflex. Patient has 2 superficial open areas over his patella on the right 12/15/15; patient's wound appeared healthier today. I been using Iodoflex for one week. Areas on his right anterior lateral leg. Superficial excoriations on his patella appears stable I have not really done anything particular about this 12/22/15 patient presents today for a follow-up visit concerning his ongoing right lower extremity open wound. We have been using Iodoflex which she is tolerating well at this point. Subsequently he has also been tolerating the 2 layer compression wrapping that we have recommended. Subsequently his right knee where he had some superficial excoriations does seem to be healing this is scabbed over at this point in time and shows no sign of infection which is  excellent news. He tells me that he really is not having any significant pain in fact he rates his be a 0 out of 10 at this point in time. 12/29/15 patient presents today for follow-up concerning the right lower extremity open wound. Currently again we have been utilizing the Iodoflex dressing which he continues to do well with. The 2 layer compression wrapping he is tolerating but he wonders why we have to wrap his entire leg for such a small wound. Subsequently he was concerned that today he should've actually been doing better and in fact was hoping this would be healed when it was unwrapped. Obviously it was not. This is progressing nicely and my opinion when looking at the charts currently. I explained to patient that he does have to be somewhat patient with this process as again wounds do not heal often as fast as we would like them to move were dealing with the chronic nature of this type of wound. However we are seeing continual improvement week by week. 01/05/16; patient arrives today quite irritated about her continued use of compression. He tells me he fell off of woodpile he has a continued open area in the middle of scar tissue on his right knee. He had this when I last saw him however he is not really seeking attention for this. Wound we are looking at is on his lateral Todd Davidson, Todd G. ([id number]) right calf 01/12/16; his wound is totally healed today. Several walls eschars over the scar tissue on his right knee. However he does not want me to try and deal with this. Objective Constitutional Sitting or standing Blood Pressure is within target range for patient.. Pulse regular and within target range for patient.Marland Kitchen Respirations regular, non-labored and within target range.. Temperature is normal and within the target range for the patient.. Vitals Time Taken: 9:04 AM, Height: 72 in, Weight: 160 lbs, BMI: 21.7, Temperature: 98.0 F, Pulse: 60 bpm, Respiratory Rate: 18 breaths/min,  Blood Pressure: 123/60 mmHg. General Notes: Wound exam; wound is totally epithelialized  and healed. No surrounding tenderness no evidence of infection. He has several superficial eschars over his right patella in the middle of scar tissue however he does not want me to deal with this. Really looks like it needs some superficial debridement Integumentary (Hair, Skin) Wound #1 status is Open. Original cause of wound was Trauma. The wound is located on the Right,Lateral Lower Leg. The wound measures 0cm length x 0cm width x 0cm depth; 0cm^2 area and 0cm^3 volume. The wound is limited to skin breakdown. There is no tunneling or undermining noted. There is a none present amount of drainage noted. The wound margin is distinct with the outline attached to the wound base. There is no granulation within the wound bed. There is no necrotic tissue within the wound bed. The periwound skin appearance exhibited: Scarring. The periwound skin appearance did not exhibit: Moist, Erythema. Periwound temperature was noted as No Abnormality. Assessment Active Problems ICD-10 I87.311 - Chronic venous hypertension (idiopathic) with ulcer of right lower extremity S86.821D - Laceration of other muscle(s) and tendon(s) at lower leg level, right leg, subsequent encounter Todd Davidson, Todd Davidson ([id number]) Plan Discharge From [facility] Services: Discharge from [place] - Please call our office if you have any questions or concerns. #1 the patient is totally epithelialized and can be discharged. He has some evidence of stasis physiology although I did not even attempt to try and talk this man into stockings. #2 The areas over his right patella looks somewhat concerning to me although the patient was not at all concerned and didn't want me to look at these. #3 he can be discharged from the clinic Electronic Signature(s) Signed: 01/12/2016 5:12:34 PM By: Linton Ham MD Entered By: Linton Ham on 01/12/2016  09:59:15 Todd Davidson ([id number]) -------------------------------------------------------------------------------- SuperBill Details Patient Name: Todd Davidson Date of Service: 01/12/2016 Medical Record Patient Account Number: [id number] Number: Treating RN: Ahmed Prima [birth year] (79 y.o. Other Clinician: Date of Birth/Sex: Male) Treating ROBSON, [place] Primary Care Physician: Lamonte Sakai Physician/Extender: G Referring Physician: Lamonte Sakai Weeks in Treatment: 7 Diagnosis Coding ICD-10 Codes Code Description I87.311 Chronic venous hypertension (idiopathic) with ulcer of right lower extremity Laceration of other muscle(s) and tendon(s) at lower leg level, right leg, subsequent S86.821D encounter Facility Procedures CPT4 Code: [id number] Description: [phone] - WOUND CARE VISIT-LEV 2 EST PT Modifier: Quantity: 1 Physician Procedures CPT4 Code Description: [id number] - WC PHYS LEVEL 2 - EST PT ICD-10 Description Diagnosis I87.311 Chronic venous hypertension (idiopathic) with ulcer Modifier: of right lower Quantity: 1 extremity Electronic Signature(s) Signed: 01/12/2016 5:12:34 PM By: Linton Ham MD Signed: 01/13/2016 5:37:51 PM By: Alric Quan Entered By: Alric Quan on 01/12/2016 10:14:46

## 2016-01-14 NOTE — Progress Notes (Addendum)
Todd, Davidson ([id number]) Visit Report for 01/12/2016 Arrival Information Details Patient Name: BYNUM, Davidson. Date of Service: 01/12/2016 8:45 AM Medical Record Number: [id number] Patient Account Number: [id number] Date of Birth/Sex: [birth year] (80 y.o. Male) Treating RN: Ahmed Prima Primary Care Physician: Lamonte Sakai Other Clinician: Referring Physician: Kasandra Knudsen Treating Physician/Extender: Tito Dine in Treatment: 7 Visit Information History Since Last Visit All ordered tests and consults were completed: No Patient Arrived: Kasandra Knudsen Added or deleted any medications: No Arrival Time: 09:03 Any new allergies or adverse reactions: No Accompanied By: self Had a fall or experienced change in No Transfer Assistance: None activities of daily living that may affect Patient Identification Verified: Yes risk of falls: Secondary Verification Process Completed: Yes Signs or symptoms of abuse/neglect since last No Patient Requires Transmission-Based No visito Precautions: Hospitalized since last visit: No Patient Has Alerts: Yes Pain Present Now: No Patient Alerts: DM II Electronic Signature(s) Signed: 01/13/2016 5:37:51 PM By: Alric Quan Entered By: Alric Quan on 01/12/2016 09:03:58 Todd Davidson ([id number]) -------------------------------------------------------------------------------- Clinic Level of Care Assessment Details Patient Name: Todd Davidson Date of Service: 01/12/2016 8:45 AM Medical Record Number: [id number] Patient Account Number: [id number] Date of Birth/Sex: 1935-10-10 (80 y.o. Male) Treating RN: Ahmed Prima Primary Care Physician: Lamonte Sakai Other Clinician: Referring Physician: Kasandra Knudsen Treating Physician/Extender: Tito Dine in Treatment: 7 Clinic Level of Care Assessment Items TOOL 4 Quantity Score X - Use when only an EandM is performed on FOLLOW-UP visit 1 0 ASSESSMENTS - Nursing Assessment  / Reassessment X - Reassessment of Co-morbidities (includes updates in patient status) 1 10 X - Reassessment of Adherence to Treatment Plan 1 5 ASSESSMENTS - Wound and Skin Assessment / Reassessment X - Simple Wound Assessment / Reassessment - one wound 1 5 []  - Complex Wound Assessment / Reassessment - multiple wounds 0 []  - Dermatologic / Skin Assessment (not related to wound area) 0 ASSESSMENTS - Focused Assessment []  - Circumferential Edema Measurements - multi extremities 0 []  - Nutritional Assessment / Counseling / Intervention 0 []  - Lower Extremity Assessment (monofilament, tuning fork, pulses) 0 []  - Peripheral Arterial Disease Assessment (using hand held doppler) 0 ASSESSMENTS - Ostomy and/or Continence Assessment and Care []  - Incontinence Assessment and Management 0 []  - Ostomy Care Assessment and Management (repouching, etc.) 0 PROCESS - Coordination of Care X - Simple Patient / Family Education for ongoing care 1 15 []  - Complex (extensive) Patient / Family Education for ongoing care 0 X - Staff obtains Programmer, systems, Records, Test Results / Process Orders 1 10 []  - Staff telephones HHA, Nursing Homes / Clarify orders / etc 0 []  - Routine Transfer to another Facility (non-emergent condition) 0 Todd, Lucien G. ([id number]) []  - Routine Hospital Admission (non-emergent condition) 0 []  - New Admissions / Biomedical engineer / Ordering NPWT, Apligraf, etc. 0 []  - Emergency Hospital Admission (emergent condition) 0 X - Simple Discharge Coordination 1 10 []  - Complex (extensive) Discharge Coordination 0 PROCESS - Special Needs []  - Pediatric / Minor Patient Management 0 []  - Isolation Patient Management 0 []  - Hearing / Language / Visual special needs 0 []  - Assessment of Community assistance (transportation, D/C planning, etc.) 0 []  - Additional assistance / Altered mentation 0 []  - Support Surface(s) Assessment (bed, cushion, seat, etc.) 0 INTERVENTIONS - Wound Cleansing /  Measurement X - Simple Wound Cleansing - one wound 1 5 []  - Complex Wound Cleansing - multiple wounds 0 X - Wound Imaging (photographs -  any number of wounds) 1 5 []  - Wound Tracing (instead of photographs) 0 []  - Simple Wound Measurement - one wound 0 []  - Complex Wound Measurement - multiple wounds 0 INTERVENTIONS - Wound Dressings []  - Small Wound Dressing one or multiple wounds 0 []  - Medium Wound Dressing one or multiple wounds 0 []  - Large Wound Dressing one or multiple wounds 0 []  - Application of Medications - topical 0 []  - Application of Medications - injection 0 INTERVENTIONS - Miscellaneous []  - External ear exam 0 Todd, Riaz G. ([id number]) []  - Specimen Collection (cultures, biopsies, blood, body fluids, etc.) 0 []  - Specimen(s) / Culture(s) sent or taken to Lab for analysis 0 []  - Patient Transfer (multiple staff / Harrel Lemon Lift / Similar devices) 0 []  - Simple Staple / Suture removal (25 or less) 0 []  - Complex Staple / Suture removal (26 or more) 0 []  - Hypo / Hyperglycemic Management (close monitor of Blood Glucose) 0 []  - Ankle / Brachial Index (ABI) - do not check if billed separately 0 X - Vital Signs 1 5 Has the patient been seen at the hospital within the last three years: Yes Total Score: 70 Level Of Care: New/Established - Level 2 Electronic Signature(s) Signed: 01/13/2016 5:37:51 PM By: Alric Quan Entered By: Alric Quan on 01/12/2016 10:14:33 Todd Davidson ([id number]) -------------------------------------------------------------------------------- Encounter Discharge Information Details Patient Name: Todd Davidson Date of Service: 01/12/2016 8:45 AM Medical Record Number: [id number] Patient Account Number: [id number] Date of Birth/Sex: 1935/11/11 (80 y.o. Male) Treating RN: Ahmed Prima Primary Care Physician: Lamonte Sakai Other Clinician: Referring Physician: Kasandra Knudsen Treating Physician/Extender: Tito Dine in  Treatment: 7 Encounter Discharge Information Items Discharge Pain Level: 0 Discharge Condition: Stable Ambulatory Status: Cane Discharge Destination: Home Transportation: Private Auto Accompanied By: self Schedule Follow-up Appointment: No Medication Reconciliation completed and provided to Patient/Care Yes Jla Reynolds: Provided on Clinical Summary of Care: 01/12/2016 Form Type Recipient Paper Patient LI Electronic Signature(s) Signed: 01/12/2016 9:16:13 AM By: Ruthine Dose Entered By: Ruthine Dose on 01/12/2016 09:16:12 Todd Davidson ([id number]) -------------------------------------------------------------------------------- Lower Extremity Assessment Details Patient Name: Todd Davidson Date of Service: 01/12/2016 8:45 AM Medical Record Number: [id number] Patient Account Number: [id number] Date of Birth/Sex: 10/19/1935 (80 y.o. Male) Treating RN: Ahmed Prima Primary Care Physician: Lamonte Sakai Other Clinician: Referring Physician: Kasandra Knudsen Treating Physician/Extender: Ricard Dillon Weeks in Treatment: 7 Vascular Assessment Pulses: Posterior Tibial Dorsalis Pedis Palpable: [Right:Yes] Extremity colors, hair growth, and conditions: Extremity Color: [Right:Hyperpigmented] Temperature of Extremity: [Right:Warm] Capillary Refill: [Right:< 3 seconds] Electronic Signature(s) Signed: 01/13/2016 5:37:51 PM By: Alric Quan Entered By: Alric Quan on 01/12/2016 09:04:53 Todd Davidson ([id number]) -------------------------------------------------------------------------------- Multi Wound Chart Details Patient Name: Todd Davidson Date of Service: 01/12/2016 8:45 AM Medical Record Number: [id number] Patient Account Number: [id number] Date of Birth/Sex: 1935/08/09 (80 y.o. Male) Treating RN: Ahmed Prima Primary Care Physician: Lamonte Sakai Other Clinician: Referring Physician: Kasandra Knudsen Treating Physician/Extender: Ricard Dillon Weeks  in Treatment: 7 Vital Signs Height(in): 72 Pulse(bpm): 60 Weight(lbs): 160 Blood Pressure 123/60 (mmHg): Body Mass Index(BMI): 22 Temperature(F): 98.0 Respiratory Rate 18 (breaths/min): Photos: [1:No Photos] [N/A:N/A] Wound Location: [1:Right Lower Leg - Lateral N/A] Wounding Event: [1:Trauma] [N/A:N/A] Primary Etiology: [1:Diabetic Wound/Ulcer of N/A the Lower Extremity] Secondary Etiology: [1:Trauma, Other] [N/A:N/A] Comorbid History: [1:Cataracts, Asthma, Sleep N/A Apnea, Coronary Artery Disease, Hypertension, Type II Diabetes, Osteoarthritis, Neuropathy] Date Acquired: [1:10/20/2015] [N/A:N/A] Weeks of Treatment: [1:7] [N/A:N/A] Wound Status: [1:Open] [N/A:N/A] Measurements L x W x  D 0x0x0 [N/A:N/A] (cm) Area (cm) : [1:0] [N/A:N/A] Volume (cm) : [1:0] [N/A:N/A] % Reduction in Area: [1:100.00%] [N/A:N/A] % Reduction in Volume: 100.00% [N/A:N/A] Classification: [1:Grade 1] [N/A:N/A] Exudate Amount: [1:None Present] [N/A:N/A] Wound Margin: [1:Distinct, outline attached N/A] Granulation Amount: [1:None Present (0%)] [N/A:N/A] Necrotic Amount: [1:None Present (0%)] [N/A:N/A] Exposed Structures: [1:Fascia: No Fat: No Tendon: No Muscle: No Joint: No] [N/A:N/A] Bone: No Limited to Skin Breakdown Epithelialization: Large (67-100%) N/A N/A Periwound Skin Texture: Scarring: Yes N/A N/A Periwound Skin Moist: No N/A N/A Moisture: Periwound Skin Color: Erythema: No N/A N/A Temperature: No Abnormality N/A N/A Tenderness on No N/A N/A Palpation: Wound Preparation: Ulcer Cleansing: N/A N/A Rinsed/Irrigated with Saline Topical Anesthetic Applied: None Treatment Notes Electronic Signature(s) Signed: 01/13/2016 5:37:51 PM By: Alric Quan Entered By: Alric Quan on 01/12/2016 09:10:20 Todd Davidson ([id number]) -------------------------------------------------------------------------------- [place] Details Patient Name: Todd Davidson Date  of Service: 01/12/2016 8:45 AM Medical Record Number: [id number] Patient Account Number: [id number] Date of Birth/Sex: [birth year] (80 y.o. Male) Treating RN: Ahmed Prima Primary Care Physician: Lamonte Sakai Other Clinician: Referring Physician: Kasandra Knudsen Treating Physician/Extender: Tito Dine in Treatment: 7 Active Inactive Electronic Signature(s) Signed: 01/20/2016 1:39:29 PM By: Gretta Cool RN, BSN, Kim RN, BSN Signed: 02/04/2016 4:54:36 PM By: Alric Quan Previous Signature: 01/13/2016 5:37:51 PM Version By: Alric Quan Entered By: Gretta Cool RN, BSN, Kim on 01/20/2016 13:39:29 Todd Davidson ([id number]) -------------------------------------------------------------------------------- Pain Assessment Details Patient Name: Todd Davidson Date of Service: 01/12/2016 8:45 AM Medical Record Number: [id number] Patient Account Number: [id number] Date of Birth/Sex: 1936/01/08 (80 y.o. Male) Treating RN: Ahmed Prima Primary Care Physician: Lamonte Sakai Other Clinician: Referring Physician: Kasandra Knudsen Treating Physician/Extender: Ricard Dillon Weeks in Treatment: 7 Active Problems Location of Pain Severity and Description of Pain Patient Has Paino No Site Locations With Dressing Change: No Pain Management and Medication Current Pain Management: Electronic Signature(s) Signed: 01/13/2016 5:37:51 PM By: Alric Quan Entered By: Alric Quan on 01/12/2016 09:04:04 Todd Davidson ([id number]) -------------------------------------------------------------------------------- Patient/Caregiver Education Details Patient Name: Todd Davidson Date of Service: 01/12/2016 8:45 AM Medical Record Number: [id number] Patient Account Number: [id number] Date of Birth/Gender: [birth year] (80 y.o. Male) Treating RN: Ahmed Prima Primary Care Physician: Lamonte Sakai Other Clinician: Referring Physician: Kasandra Knudsen Treating Physician/Extender: Tito Dine in Treatment: 7 Education Assessment Education Provided To: Patient Education Topics Provided Wound/Skin Impairment: Handouts: Other: Please call our office if you have any questions or concerns. Methods: Explain/Verbal Responses: State content correctly Electronic Signature(s) Signed: 01/13/2016 5:37:51 PM By: Alric Quan Entered By: Alric Quan on 01/12/2016 09:09:34 Todd Davidson ([id number]) -------------------------------------------------------------------------------- Wound Assessment Details Patient Name: Todd Davidson Date of Service: 01/12/2016 8:45 AM Medical Record Number: [id number] Patient Account Number: [id number] Date of Birth/Sex: 11/12/1935 (79 y.o. Male) Treating RN: Ahmed Prima Primary Care Physician: Lamonte Sakai Other Clinician: Referring Physician: Kasandra Knudsen Treating Physician/Extender: Ricard Dillon Weeks in Treatment: 7 Wound Status Wound Number: 1 Primary Diabetic Wound/Ulcer of the Lower Etiology: Extremity Wound Location: Right Lower Leg - Lateral Secondary Trauma, Other Wounding Event: Trauma Etiology: Date Acquired: 10/20/2015 Wound Open Weeks Of Treatment: 7 Status: Clustered Wound: No Comorbid Cataracts, Asthma, Sleep Apnea, History: Coronary Artery Disease, Hypertension, Type II Diabetes, Osteoarthritis, Neuropathy Photos Photo Uploaded By: Alric Quan on 01/12/2016 09:11:37 Wound Measurements Length: (cm) 0 % Reducti Width: (cm) 0 % Reducti Depth: (cm) 0 Epithelia Area: (cm) 0 Tunnelin Volume: (cm) 0 Undermin on in Area: 100% on in Volume: 100%  lization: Large (67-100%) g: No ing: No Wound Description Classification: Grade 1 Wound Margin: Distinct, outline attached Exudate Amount: None Present Foul Odor After Cleansing: No Wound Bed Granulation Amount: None Present (0%) Exposed Structure Necrotic Amount: None Present (0%) Fascia Exposed: No Fat Layer Exposed: No Daigle,  Delance G. ([id number]) Tendon Exposed: No Muscle Exposed: No Joint Exposed: No Bone Exposed: No Limited to Skin Breakdown Periwound Skin Texture Texture Color No Abnormalities Noted: No No Abnormalities Noted: No Scarring: Yes Erythema: No Moisture Temperature / Pain No Abnormalities Noted: No Temperature: No Abnormality Moist: No Wound Preparation Ulcer Cleansing: Rinsed/Irrigated with Saline Topical Anesthetic Applied: None Electronic Signature(s) Signed: 01/13/2016 5:37:51 PM By: Alric Quan Entered By: Alric Quan on 01/12/2016 09:07:16 Todd Davidson ([id number]) -------------------------------------------------------------------------------- Vitals Details Patient Name: Todd Davidson Date of Service: 01/12/2016 8:45 AM Medical Record Number: [id number] Patient Account Number: [id number] Date of Birth/Sex: [birth year] (80 y.o. Male) Treating RN: Ahmed Prima Primary Care Physician: Lamonte Sakai Other Clinician: Referring Physician: Kasandra Knudsen Treating Physician/Extender: Ricard Dillon Weeks in Treatment: 7 Vital Signs Time Taken: 09:04 Temperature (F): 98.0 Height (in): 72 Pulse (bpm): 60 Weight (lbs): 160 Respiratory Rate (breaths/min): 18 Body Mass Index (BMI): 21.7 Blood Pressure (mmHg): 123/60 Reference Range: 80 - 120 mg / dl Electronic Signature(s) Signed: 01/13/2016 5:37:51 PM By: Alric Quan Entered By: Alric Quan on 01/12/2016 09:04:40

## 2016-03-10 ENCOUNTER — Ambulatory Visit (INDEPENDENT_AMBULATORY_CARE_PROVIDER_SITE_OTHER): Payer: Medicare Other | Admitting: Urology

## 2016-03-10 ENCOUNTER — Encounter: Payer: Self-pay | Admitting: Urology

## 2016-03-10 VITALS — BP 156/76 | HR 74 | Ht 69.0 in | Wt 159.0 lb

## 2016-03-10 DIAGNOSIS — N200 Calculus of kidney: Secondary | ICD-10-CM

## 2016-03-10 DIAGNOSIS — N400 Enlarged prostate without lower urinary tract symptoms: Secondary | ICD-10-CM

## 2016-03-10 DIAGNOSIS — R972 Elevated prostate specific antigen [PSA]: Secondary | ICD-10-CM | POA: Diagnosis not present

## 2016-03-10 NOTE — Progress Notes (Signed)
03/10/2016 3:30 PM   [place] [birth year] [id number]  Referring provider: Perrin Maltese, MD [address] [place], [place] [address]  Chief Complaint  Patient presents with  . Elevated PSA    New Patient    HPI: The patient is a 80 year old gentleman who presents to transfer his care from [name].  1. BPH He was on Flomax, but he stopped it when he ran out. He has not noticed much change in his symptoms since stopping the medication. His nocturia 3. His mildly weak stream. He feels like he empties his bladder. He is not bothered by his urinary symptoms and does not want to restart his Flomax.  2. Nephrolithiasis Per report, he has a 10 mm left upper pole stone one year ago on KUB. Images unavailable. Also had history making uric acid stones.  3. Elevated PSA PSAs were running in the 5 to 6 range until last year. His PSA rose to 7.05 in August 2016. It was 8.0 in February 2017. It rose again to 10.1 in November 2017. He has no history of previous prostate biopsy.  Father had prostate cancer. He was treated with radiation therapy. He lived until the age of 17 and did not die from prostate cancer.   PMH: Past Medical History:  Diagnosis Date  . Arthritis    neck, shoulders, hips, knees  . Asthma   . Barrett esophagus   . BPH (benign prostatic hyperplasia)   . Coronary artery disease    annual stress with Dr. Humphrey Rolls. no new findings  . Depression   . Diabetes mellitus without complication ([facility])   . GERD (gastroesophageal reflux disease)   . Hypercholesteremia   . Hypertension   . Kidney stone   . Neuromuscular disorder ([facility])    neuropathy - feet  . Occasional tremors    hands  . Renal insufficiency   . Sleep apnea    doesn't wear CPAP  . Wears dentures    full upper    Surgical History: Past Surgical History:  Procedure Laterality Date  . CARDIAC CATHETERIZATION  1999, 2000   stents placed  . CATARACT EXTRACTION W/PHACO Left 10/29/2014   Procedure:  CATARACT EXTRACTION PHACO AND INTRAOCULAR LENS PLACEMENT (I[facility]);  Surgeon: Leandrew Koyanagi, MD;  Location: [facility];  Service: Ophthalmology;  Laterality: Left;  DIABETIC - insulin MALYUGIN  . CERVICAL FUSION    . ESOPHAGOGASTRODUODENOSCOPY (EGD) WITH PROPOFOL N/A 12/17/2015   Procedure: ESOPHAGOGASTRODUODENOSCOPY (EGD) WITH PROPOFOL;  Surgeon: [name], MD;  Location: [facility] ENDOSCOPY;  Service: Endoscopy;  Laterality: N/A;  . HERNIA REPAIR    . JOINT REPLACEMENT     knee  . KNEE SURGERY Right    joint removal with fusion and graft  . NISSEN FUNDOPLICATION      Home Medications:  Allergies as of 03/10/2016      Reactions   Adhesive [tape] Other (See Comments)   Heavy tape causes raw skin   Macrolides And Ketolides Swelling   "-mycin" drugs   Propranolol    Sulfa Antibiotics Swelling   Telbivudine Swelling   "mycin" drugs      Medication List       Accurate as of 03/10/16  3:30 PM. Always use your most recent med list.          amLODipine 10 MG tablet Commonly known as:  NORVASC Take 10 mg by mouth daily. AM   aspirin 325 MG tablet Take 325 mg by mouth daily. AM  atorvastatin 80 MG tablet Commonly known as:  LIPITOR Take 80 mg by mouth daily. PM   benazepril 20 MG tablet Commonly known as:  LOTENSIN Take 20 mg by mouth 2 (two) times daily. PM   clonazePAM 0.5 MG tablet Commonly known as:  KLONOPIN Take 0.5 mg by mouth at bedtime.   gabapentin 300 MG capsule Commonly known as:  NEURONTIN Take 300 mg by mouth 2 (two) times daily.   insulin aspart protamine- aspart (70-30) 100 UNIT/ML injection Commonly known as:  NOVOLOG MIX 70/30 Inject 12-28 Units into the skin 2 (two) times daily with a meal. 12 units before breakfast and 28 units before dinner   magnesium oxide 400 MG tablet Commonly known as:  MAG-OX Take 400 mg by mouth daily. PM   Melatonin 10 MG Tabs Take by mouth.   nitroGLYCERIN 0.4 MG SL tablet Commonly known as:   NITROSTAT Place under the tongue.   omeprazole 40 MG capsule Commonly known as:  PRILOSEC Take 40 mg by mouth daily. PM   tamsulosin 0.4 MG Caps capsule Commonly known as:  FLOMAX Take 0.4 mg by mouth daily after breakfast.       Allergies:  Allergies  Allergen Reactions  . Adhesive [Tape] Other (See Comments)    Heavy tape causes raw skin  . Macrolides And Ketolides Swelling    "-mycin" drugs  . Propranolol   . Sulfa Antibiotics Swelling  . Telbivudine Swelling    "mycin" drugs     Family History: Family History  Problem Relation Age of Onset  . Prostate cancer Father   . Heart disease Brother     Social History:  reports that he quit smoking about 43 years ago. He has never used smokeless tobacco. He reports that he drinks about 1.2 oz of alcohol per week . He reports that he does not use drugs.  ROS: UROLOGY Frequent Urination?: Yes Hard to postpone urination?: Yes Burning/pain with urination?: No Get up at night to urinate?: Yes Leakage of urine?: Yes Urine stream starts and stops?: Yes Trouble starting stream?: Yes Do you have to strain to urinate?: No Blood in urine?: No Urinary tract infection?: No Sexually transmitted disease?: No Injury to kidneys or bladder?: No Painful intercourse?: No Weak stream?: Yes Erection problems?: Yes Penile pain?: No  Gastrointestinal Nausea?: No Vomiting?: No Indigestion/heartburn?: No Diarrhea?: No Constipation?: Yes  Constitutional Fever: No Night sweats?: No Weight loss?: Yes Fatigue?: Yes  Skin Skin rash/lesions?: No Itching?: No  Eyes Blurred vision?: No Double vision?: No  Ears/Nose/Throat Sore throat?: No Sinus problems?: Yes  Hematologic/Lymphatic Swollen glands?: No Easy bruising?: Yes  Cardiovascular Leg swelling?: No Chest pain?: No  Respiratory Cough?: Yes Shortness of breath?: Yes  Endocrine Excessive thirst?: No  Musculoskeletal Back pain?: No Joint pain?:  Yes  Neurological Headaches?: No Dizziness?: No  Psychologic Depression?: Yes Anxiety?: Yes  Physical Exam: BP (!) 156/76   Pulse 74   Ht 5\' 9"  (1.753 m)   Wt 159 lb (72.1 kg)   BMI 23.48 kg/m   Constitutional:  Alert and oriented, No acute distress. HEENT: [place] AT, moist mucus membranes.  Trachea midline, no masses. Cardiovascular: No clubbing, cyanosis, or edema. Respiratory: Normal respiratory effort, no increased work of breathing. GI: Abdomen is soft, nontender, nondistended, no abdominal masses GU: No CVA tenderness. DRE: 60 g. No nodules. No induration. Skin: No rashes, bruises or suspicious lesions. Lymph: No cervical or inguinal adenopathy. Neurologic: Grossly intact, no focal deficits, moving all 4 extremities.  Psychiatric: Normal mood and affect.  Laboratory Data: Lab Results  Component Value Date   WBC 8.6 04/06/2015   HGB 13.9 04/06/2015   HCT 40.8 04/06/2015   MCV 84.6 04/06/2015   PLT 116 (L) 04/06/2015    Lab Results  Component Value Date   CREATININE 1.04 04/06/2015    No results found for: PSA  No results found for: TESTOSTERONE  No results found for: HGBA1C  Urinalysis    Component Value Date/Time   COLORURINE YELLOW (A) 07/26/2014 1335   APPEARANCEUR CLEAR (A) 07/26/2014 1335   APPEARANCEUR [place] 06/16/2012 1849   LABSPEC 1.011 07/26/2014 1335   LABSPEC 1.012 06/16/2012 1849   PHURINE 5.0 07/26/2014 1335   GLUCOSEU NEGATIVE 07/26/2014 1335   GLUCOSEU 50 mg/dL 06/16/2012 1849   HGBUR NEGATIVE 07/26/2014 1335   BILIRUBINUR NEGATIVE 07/26/2014 1335   BILIRUBINUR Negative 06/16/2012 1849   KETONESUR NEGATIVE 07/26/2014 [place] 07/26/2014 1335   NITRITE NEGATIVE 07/26/2014 1335   LEUKOCYTESUR NEGATIVE 07/26/2014 1335   LEUKOCYTESUR Trace 06/16/2012 1849     Assessment & Plan:    1. Elevated PSA I had a very long discussion with the patient regarding the role of PSA screening for prostate cancer guidelines. We went  over the American urological Association guidelines for PSA screening. He does understand that typically we don't screen patient's that are 31 for prostate cancer via PSA. He does however have a known trend for his PSA  which gone from essentially 7 to 10 in a little over a year. We also discussed the natural history of prostate cancer. We discussed further workup of his PSA which could include prostate biopsy versus continued surveillance of his PSA at regular intervals. I discussed the risks and benefits of a prostate biopsy as well as PSA surveillance. We talked about risks that include but are not limited to bleeding and infection. At this time, the patient is not sure if he wants to proceed with a prostate biopsy. I will like to get him back for close follow-up in 3 months with a PSA prior to continue to trend his level.  2. BPH -patient not interested in medication at this time  3. Nephrolithiasis -obtain KUB prior to next follow up appointment to monitor stone size.  Return in about 3 months (around 06/08/2016) for with PSA and KUB prior.  Nickie Retort, MD  [facility] [address], [place], [place] [address] [phone]

## 2016-03-17 DIAGNOSIS — M7540 Impingement syndrome of unspecified shoulder: Secondary | ICD-10-CM | POA: Insufficient documentation

## 2016-05-31 ENCOUNTER — Other Ambulatory Visit: Payer: Medicare Other

## 2016-05-31 DIAGNOSIS — N400 Enlarged prostate without lower urinary tract symptoms: Secondary | ICD-10-CM

## 2016-05-31 DIAGNOSIS — R972 Elevated prostate specific antigen [PSA]: Secondary | ICD-10-CM

## 2016-06-01 LAB — PSA TOTAL (REFLEX TO FREE): Prostate Specific Ag, Serum: 13 ng/mL — ABNORMAL HIGH (ref 0.0–4.0)

## 2016-06-07 ENCOUNTER — Other Ambulatory Visit: Payer: Self-pay

## 2016-06-09 ENCOUNTER — Ambulatory Visit
Admission: RE | Admit: 2016-06-09 | Discharge: 2016-06-09 | Disposition: A | Discharge status: Home / Self Care | Payer: Medicare Other | Source: Ambulatory Visit | Attending: Urology | Admitting: Urology

## 2016-06-09 ENCOUNTER — Encounter: Payer: Self-pay | Admitting: Urology

## 2016-06-09 ENCOUNTER — Ambulatory Visit (INDEPENDENT_AMBULATORY_CARE_PROVIDER_SITE_OTHER): Payer: Medicare Other | Admitting: Urology

## 2016-06-09 VITALS — BP 133/74 | HR 60 | Ht 72.0 in | Wt 157.6 lb

## 2016-06-09 DIAGNOSIS — N200 Calculus of kidney: Secondary | ICD-10-CM | POA: Diagnosis present

## 2016-06-09 DIAGNOSIS — R972 Elevated prostate specific antigen [PSA]: Secondary | ICD-10-CM

## 2016-06-09 DIAGNOSIS — Z87442 Personal history of urinary calculi: Secondary | ICD-10-CM

## 2016-06-09 NOTE — Progress Notes (Signed)
06/09/2016 9:23 AM   Todd Davidson Oct 10, 1935 [id number]  Referring provider: Perrin Maltese, MD [address] [place], [place] [address]  Chief Complaint  Patient presents with  . Follow-up    elevated PSA    HPI: The patient is a 81 year old gentleman who presents to transfer his care from Dr. Jacqlyn Davidson.  1. Elevated PSA PSAs were running in the 5 to 6 range until last year. His PSA rose to 7.05 in August 2016. It was 8.0 in February 2017. It rose again to 10.1 in November 2017. It is now 13.0 in March 2018. He has no history of previous prostate biopsy.    DRE benign 60 in November 2017.   Patient's father had prostate cancer. He was treated with radiation therapy. He lived until the age of 3 and did not die from prostate cancer.   2. Nephrolithiasis Per report, he has a 10 mm left upper pole stone one year ago on KUB. Images unavailable. However repeat KUB in March 2018 shows no stone. Also had history making uric acid stones.   PMH: Past Medical History:  Diagnosis Date  . Arthritis    neck, shoulders, hips, knees  . Asthma   . Barrett esophagus   . BPH (benign prostatic hyperplasia)   . Coronary artery disease    annual stress with Dr. Humphrey Rolls. no new findings  . Depression   . Diabetes mellitus without complication ([place])   . GERD (gastroesophageal reflux disease)   . Hypercholesteremia   . Hypertension   . Kidney stone   . Neuromuscular disorder (HCC)    neuropathy - feet  . Occasional tremors    hands  . Renal insufficiency   . Sleep apnea    doesn't wear CPAP  . Wears dentures    full upper    Surgical History: Past Surgical History:  Procedure Laterality Date  . CARDIAC CATHETERIZATION  1999, 2000   stents placed  . CATARACT EXTRACTION W/PHACO Left 10/29/2014   Procedure: CATARACT EXTRACTION PHACO AND INTRAOCULAR LENS PLACEMENT (IOC);  Surgeon: Todd Koyanagi, MD;  Location: [facility];  Service: Ophthalmology;  Laterality: Left;   DIABETIC - insulin MALYUGIN  . CERVICAL FUSION    . ESOPHAGOGASTRODUODENOSCOPY (EGD) WITH PROPOFOL N/A 12/17/2015   Procedure: ESOPHAGOGASTRODUODENOSCOPY (EGD) WITH PROPOFOL;  Surgeon: Todd Sails, MD;  Location: [facility] ENDOSCOPY;  Service: Endoscopy;  Laterality: N/A;  . HERNIA REPAIR    . JOINT REPLACEMENT     knee  . KNEE SURGERY Right    joint removal with fusion and graft  . NISSEN FUNDOPLICATION      Home Medications:  Allergies as of 06/09/2016      Reactions   Adhesive [tape] Other (See Comments)   Heavy tape causes raw skin   Macrolides And Ketolides Swelling   "-mycin" drugs   Propranolol    Sulfa Antibiotics Swelling   Telbivudine Swelling   "mycin" drugs      Medication List       Accurate as of 06/09/16  9:23 AM. Always use your most recent med list.          amLODipine 10 MG tablet Commonly known as:  NORVASC Take 10 mg by mouth daily. AM   aspirin 325 MG tablet Take 325 mg by mouth daily. AM   atorvastatin 80 MG tablet Commonly known as:  LIPITOR Take 80 mg by mouth daily. PM   benazepril 20 MG tablet Commonly known as:  LOTENSIN Take 20 mg  by mouth 2 (two) times daily. PM   clonazePAM 0.5 MG tablet Commonly known as:  KLONOPIN Take 0.5 mg by mouth at bedtime.   gabapentin 300 MG capsule Commonly known as:  NEURONTIN Take 300 mg by mouth 2 (two) times daily.   insulin aspart protamine- aspart (70-30) 100 UNIT/ML injection Commonly known as:  NOVOLOG MIX 70/30 Inject 12-28 Units into the skin 2 (two) times daily with a meal. 12 units before breakfast and 28 units before dinner   magnesium oxide 400 MG tablet Commonly known as:  MAG-OX Take 400 mg by mouth daily. PM   Melatonin 10 MG Tabs Take by mouth.   nitroGLYCERIN 0.4 MG SL tablet Commonly known as:  NITROSTAT Place under the tongue.   omeprazole 40 MG capsule Commonly known as:  PRILOSEC Take 40 mg by mouth daily. PM   tamsulosin 0.4 MG Caps capsule Commonly known as:   FLOMAX Take 0.4 mg by mouth daily after breakfast.       Allergies:  Allergies  Allergen Reactions  . Adhesive [Tape] Other (See Comments)    Heavy tape causes raw skin  . Macrolides And Ketolides Swelling    "-mycin" drugs  . Propranolol   . Sulfa Antibiotics Swelling  . Telbivudine Swelling    "mycin" drugs     Family History: Family History  Problem Relation Age of Onset  . Prostate cancer Father   . Heart disease Brother     Social History:  reports that he quit smoking about 43 years ago. He has never used smokeless tobacco. He reports that he drinks about 1.2 oz of alcohol per week . He reports that he does not use drugs.  ROS:                                        Physical Exam: BP 133/74   Pulse 60   Ht 6' (1.829 m)   Wt 157 lb 9.6 oz (71.5 kg)   BMI 21.37 kg/m   Constitutional:  Alert and oriented, No acute distress. HEENT: [place] AT, moist mucus membranes.  Trachea midline, no masses. Cardiovascular: No clubbing, cyanosis, or edema. Respiratory: Normal respiratory effort, no increased work of breathing. GI: Abdomen is soft, nontender, nondistended, no abdominal masses GU: No CVA tenderness.  Skin: No rashes, bruises or suspicious lesions. Lymph: No cervical or inguinal adenopathy. Neurologic: Grossly intact, no focal deficits, moving all 4 extremities. Psychiatric: Normal mood and affect.  Laboratory Data: Lab Results  Component Value Date   WBC 8.6 04/06/2015   HGB 13.9 04/06/2015   HCT 40.8 04/06/2015   MCV 84.6 04/06/2015   PLT 116 (L) 04/06/2015    Lab Results  Component Value Date   CREATININE 1.04 04/06/2015    No results found for: PSA  No results found for: TESTOSTERONE  No results found for: HGBA1C  Urinalysis    Component Value Date/Time   COLORURINE YELLOW (A) 07/26/2014 1335   APPEARANCEUR CLEAR (A) 07/26/2014 1335   APPEARANCEUR Hazy 06/16/2012 1849   LABSPEC 1.011 07/26/2014 1335   LABSPEC 1.012  06/16/2012 1849   PHURINE 5.0 07/26/2014 1335   GLUCOSEU NEGATIVE 07/26/2014 1335   GLUCOSEU 50 mg/dL 06/16/2012 1849   HGBUR NEGATIVE 07/26/2014 1335   BILIRUBINUR NEGATIVE 07/26/2014 1335   BILIRUBINUR Negative 06/16/2012 [place] 07/26/2014 1335   PROTEINUR NEGATIVE 07/26/2014 1335   NITRITE  NEGATIVE 07/26/2014 1335   LEUKOCYTESUR NEGATIVE 07/26/2014 1335   LEUKOCYTESUR Trace 06/16/2012 1849    Pertinent Imaging: KUB reviewed as above  Assessment & Plan:    1. Elevated  PSA I again had a long conversation with the patient regarding the natural history of prostate cancer as well as PSA screening and its role. I'm not terribly concerned about his current PSA value given his age, however what is somewhat concerning is that his PSA continues to rise. We discussed options at this point which included a prostate biopsy versus continued surveillance of his PSA. We discussed the biopsy in detail. We also discussed the angle of the biopsy and what he would do anything with the results. At this point, the patient has elected to recheck his PSA again in 3 months. If it continues to rise we may do need to reconsider biopsy to rule out a high-grade malignancy.  2. History of nephrolithiasis -no evidence of disease  Return in about 3 months (around 09/09/2016) for PSA prior.  Nickie Retort, MD  [facility] [address], [place], [place] [address] [phone]

## 2016-08-16 ENCOUNTER — Telehealth: Payer: Self-pay | Admitting: Urology

## 2016-08-16 NOTE — Telephone Encounter (Signed)
Pt canceled both appts and did not reschedule.  Just. F.Y.I.

## 2016-09-12 ENCOUNTER — Other Ambulatory Visit: Payer: Self-pay

## 2016-09-14 ENCOUNTER — Ambulatory Visit: Payer: Self-pay

## 2016-12-12 ENCOUNTER — Other Ambulatory Visit: Payer: Self-pay | Admitting: Internal Medicine

## 2016-12-12 ENCOUNTER — Ambulatory Visit
Admission: RE | Admit: 2016-12-12 | Discharge: 2016-12-12 | Disposition: A | Discharge status: Home / Self Care | Payer: Medicare Other | Source: Ambulatory Visit | Attending: Internal Medicine | Admitting: Internal Medicine

## 2016-12-12 DIAGNOSIS — R0602 Shortness of breath: Secondary | ICD-10-CM | POA: Diagnosis present

## 2017-04-11 ENCOUNTER — Other Ambulatory Visit: Payer: Self-pay | Admitting: Nurse Practitioner

## 2017-04-11 DIAGNOSIS — R269 Unspecified abnormalities of gait and mobility: Secondary | ICD-10-CM

## 2017-04-11 DIAGNOSIS — R251 Tremor, unspecified: Secondary | ICD-10-CM

## 2017-04-11 DIAGNOSIS — R413 Other amnesia: Secondary | ICD-10-CM

## 2017-04-15 ENCOUNTER — Ambulatory Visit
Admission: RE | Admit: 2017-04-15 | Discharge: 2017-04-15 | Disposition: A | Discharge status: Home / Self Care | Payer: Medicare Other | Source: Ambulatory Visit | Attending: Nurse Practitioner | Admitting: Nurse Practitioner

## 2017-04-15 DIAGNOSIS — R251 Tremor, unspecified: Secondary | ICD-10-CM | POA: Insufficient documentation

## 2017-04-15 DIAGNOSIS — R269 Unspecified abnormalities of gait and mobility: Secondary | ICD-10-CM | POA: Diagnosis present

## 2017-04-15 DIAGNOSIS — I6782 Cerebral ischemia: Secondary | ICD-10-CM | POA: Insufficient documentation

## 2017-04-15 DIAGNOSIS — R413 Other amnesia: Secondary | ICD-10-CM | POA: Diagnosis present

## 2017-04-27 ENCOUNTER — Ambulatory Visit: Payer: Self-pay | Admitting: Internal Medicine

## 2017-05-04 ENCOUNTER — Encounter: Payer: Self-pay | Admitting: Internal Medicine

## 2017-05-04 ENCOUNTER — Ambulatory Visit (INDEPENDENT_AMBULATORY_CARE_PROVIDER_SITE_OTHER): Payer: Medicare Other | Admitting: Internal Medicine

## 2017-05-04 VITALS — BP 146/73 | HR 63 | Resp 16 | Ht 72.0 in | Wt 148.4 lb

## 2017-05-04 DIAGNOSIS — E1122 Type 2 diabetes mellitus with diabetic chronic kidney disease: Secondary | ICD-10-CM | POA: Diagnosis not present

## 2017-05-04 DIAGNOSIS — I2510 Atherosclerotic heart disease of native coronary artery without angina pectoris: Secondary | ICD-10-CM

## 2017-05-04 DIAGNOSIS — N183 Chronic kidney disease, stage 3 (moderate): Secondary | ICD-10-CM | POA: Diagnosis not present

## 2017-05-04 DIAGNOSIS — Z794 Long term (current) use of insulin: Secondary | ICD-10-CM | POA: Diagnosis not present

## 2017-05-04 DIAGNOSIS — J449 Chronic obstructive pulmonary disease, unspecified: Secondary | ICD-10-CM

## 2017-05-04 DIAGNOSIS — R0602 Shortness of breath: Secondary | ICD-10-CM

## 2017-05-04 DIAGNOSIS — I2583 Coronary atherosclerosis due to lipid rich plaque: Secondary | ICD-10-CM

## 2017-05-04 MED ORDER — IPRATROPIUM-ALBUTEROL 0.5-2.5 (3) MG/3ML IN SOLN: 3.0000 mL | Freq: Four times a day (QID) | RESPIRATORY_TRACT | 4 refills | Status: DC | PRN

## 2017-05-04 NOTE — Patient Instructions (Signed)

## 2017-05-04 NOTE — Progress Notes (Signed)
[facility] [place], [place] [address]  Pulmonary Sleep Medicine  Office Visit Note  Patient Name: Mylen Mangan DOB: Mar 31, 1935 MRN [id number]  Date of Service: 05/04/2017  Complaints/HPI: He is doing about the same. States tha the has been having issues with cough and has clear sputum. Patient does not want any further workups done. He states that he does not want to be on mchines and actually told me he wants to be DNR. I suggested he get a home DNR order. He also is interested in using a nebulizer which I suggested now  ROS  General: (-) fever, (-) chills, (-) night sweats, (-) weakness Skin: (-) rashes, (-) itching,. Eyes: (-) visual changes, (-) redness, (-) itching. Nose and Sinuses: (-) nasal stuffiness or itchiness, (-) postnasal drip, (-) nosebleeds, (-) sinus trouble. Mouth and Throat: (-) sore throat, (-) hoarseness. Neck: (-) swollen glands, (-) enlarged thyroid, (-) neck pain. Respiratory: + cough, (-) bloody sputum, + shortness of breath, + wheezing. Cardiovascular: - ankle swelling, (-) chest pain. Lymphatic: (-) lymph node enlargement. Neurologic: (-) numbness, (-) tingling. Psychiatric: (-) anxiety, (-) depression   Current Medication: Outpatient Encounter Medications as of 05/04/2017  Medication Sig Note  . amLODipine (NORVASC) 10 MG tablet Take 10 mg by mouth daily. AM   . aspirin 325 MG tablet Take 325 mg by mouth daily. AM   . atorvastatin (LIPITOR) 80 MG tablet Take 80 mg by mouth daily. PM   . benazepril (LOTENSIN) 20 MG tablet Take 20 mg by mouth 2 (two) times daily. PM   . clonazePAM (KLONOPIN) 0.5 MG tablet Take 0.5 mg by mouth at bedtime.   . gabapentin (NEURONTIN) 300 MG capsule Take 300 mg by mouth 2 (two) times daily.   . insulin aspart protamine- aspart (NOVOLOG MIX 70/30) (70-30) 100 UNIT/ML injection Inject 12-28 Units into the skin 2 (two) times daily with a meal. 12 units before breakfast and 28 units before dinner    . magnesium oxide (MAG-OX) 400 MG tablet Take 400 mg by mouth daily. PM   . Melatonin 10 MG TABS Take by mouth. 03/10/2016: Received from: [place]: Take 10 mg by mouth nightly.  . nitroGLYCERIN (NITROSTAT) 0.4 MG SL tablet Place under the tongue. 03/10/2016: Received from: [place]: Place 0.4 mg under the tongue every 5 (five) minutes as needed. Reported on 04/21/2015  . omeprazole (PRILOSEC) 40 MG capsule Take 40 mg by mouth daily. PM   . tamsulosin (FLOMAX) 0.4 MG CAPS capsule Take 0.4 mg by mouth daily after breakfast.    No facility-administered encounter medications on file as of 05/04/2017.     Surgical History: Past Surgical History:  Procedure Laterality Date  . CARDIAC CATHETERIZATION  1999, 2000   stents placed  . CATARACT EXTRACTION W/PHACO Left 10/29/2014   Procedure: CATARACT EXTRACTION PHACO AND INTRAOCULAR LENS PLACEMENT (IOC);  Surgeon: Leandrew Koyanagi, MD;  Location: [facility];  Service: Ophthalmology;  Laterality: Left;  DIABETIC - insulin MALYUGIN  . CERVICAL FUSION    . ESOPHAGOGASTRODUODENOSCOPY (EGD) WITH PROPOFOL N/A 12/17/2015   Procedure: ESOPHAGOGASTRODUODENOSCOPY (EGD) WITH PROPOFOL;  Surgeon: [name], MD;  Location: [facility] ENDOSCOPY;  Service: Endoscopy;  Laterality: N/A;  . HERNIA REPAIR    . JOINT REPLACEMENT     knee  . KNEE SURGERY Right    joint removal with fusion and graft  . NISSEN FUNDOPLICATION      Medical History:  Past Medical History:  Diagnosis Date  . Arthritis    neck, shoulders, hips, knees  . Asthma   . Barrett esophagus   . BPH (benign prostatic hyperplasia)   . Coronary artery disease    annual stress with Dr. Humphrey Rolls. no new findings  . Depression   . Diabetes mellitus without complication ([place])   . GERD (gastroesophageal reflux disease)   . Hypercholesteremia   . Hypertension   . Kidney stone   . Neuromuscular disorder (HCC)    neuropathy  - feet  . Occasional tremors    hands  . Renal insufficiency   . Sleep apnea    doesn't wear CPAP  . Wears dentures    full upper    Family History: Family History  Problem Relation Age of Onset  . Prostate cancer Father   . Heart disease Brother     Social History: Social History   Socioeconomic History  . Marital status: Married    Spouse name: Not on file  . Number of children: Not on file  . Years of education: Not on file  . Highest education level: Not on file  Social Needs  . Financial resource strain: Not on file  . Food insecurity - worry: Not on file  . Food insecurity - inability: Not on file  . Transportation needs - medical: Not on file  . Transportation needs - non-medical: Not on file  Occupational History  . Not on file  Tobacco Use  . Smoking status: Former Smoker    Last attempt to quit: 03/21/1973    Years since quitting: 44.1  . Smokeless tobacco: Never Used  Substance and Sexual Activity  . Alcohol use: Yes    Alcohol/week: 1.2 oz    Types: 2 Cans of beer per week  . Drug use: No  . Sexual activity: Not on file  Other Topics Concern  . Not on file  Social History Narrative  . Not on file    Vital Signs: Blood pressure (!) 146/73, pulse 63, resp. rate 16, height 6' (1.829 m), weight 148 lb 6.4 oz (67.3 kg), SpO2 98 %.  Examination: General Appearance: The patient is well-developed, well-nourished, and in no distress. Skin: Gross inspection of skin unremarkable. Head: normocephalic, no gross deformities. Eyes: no gross deformities noted. ENT: ears appear grossly normal no exudates. Neck: Supple. No thyromegaly. No LAD. Respiratory: scattered rhonchi. Cardiovascular: Normal S1 and S2 without murmur or rub. Extremities: No cyanosis. pulses are equal. Neurologic: Alert and oriented. No involuntary movements.  LABS: No results found for this or any previous visit (from the past 2160 hour(s)).  Radiology: Mr Brain Wo Contrast  Result  Date: 04/15/2017 CLINICAL DATA:  Tremors.  History of Parkinson's disease. EXAM: MRI HEAD WITHOUT CONTRAST TECHNIQUE: Multiplanar, multiecho pulse sequences of the brain and surrounding structures were obtained without intravenous contrast. COMPARISON:  Head CT 07/26/2014.  No previous brain MRI. FINDINGS: Brain: Diffusion imaging does not show any acute or subacute infarction. There chronic small-vessel ischemic changes of the pons. 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 The mid brain does not show any abnormality by standard MR. No sign of volume loss in the substantia nigra region. High-resolution susceptibility weighted imaging is necessary to suggest Parkinson's disease by MRI, which was not performed. Positive findings on that sequence are only confirmatory and not primarily diagnostic. Cerebral hemispheres show an old lacunar infarction in the right caudate head and mild chronic small-vessel ischemic change of  the white matter. No cortical or large vessel territory infarction. No mass lesion, hemorrhage, hydrocephalus or extra-axial collection. Vascular: Major vessels at the base of the brain show flow. Skull and upper cervical spine: Normal Sinuses/Orbits: Clear except for some mucosal thickening in the region of the left maxillary sinus and nasal passages. Other: None IMPRESSION: No acute or reversible finding. Chronic small-vessel ischemic changes throughout the brain as outlined above. No finding specific for Parkinson's disease on this examination. See above discussion. Electronically Signed   By: Nelson Chimes M.D.   On: 04/15/2017 14:45    No results found.  Mr Brain Wo Contrast  Result Date: 04/15/2017 CLINICAL DATA:  Tremors.  History of Parkinson's disease. EXAM: MRI HEAD WITHOUT CONTRAST TECHNIQUE: Multiplanar, multiecho pulse sequences of the brain and surrounding structures were obtained without intravenous contrast. COMPARISON:  Head CT 07/26/2014.   No previous brain MRI. FINDINGS: Brain: Diffusion imaging does not show any acute or subacute infarction. There chronic small-vessel ischemic changes of the pons. 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 The mid brain does not show any abnormality by standard MR. No sign of volume loss in the substantia nigra region. High-resolution susceptibility weighted imaging is necessary to suggest Parkinson's disease by MRI, which was not performed. Positive findings on that sequence are only confirmatory and not primarily diagnostic. Cerebral hemispheres show an old lacunar infarction in the right caudate head and mild chronic small-vessel ischemic change of the white matter. No cortical or large vessel territory infarction. No mass lesion, hemorrhage, hydrocephalus or extra-axial collection. Vascular: Major vessels at the base of the brain show flow. Skull and upper cervical spine: Normal Sinuses/Orbits: Clear except for some mucosal thickening in the region of the left maxillary sinus and nasal passages. Other: None IMPRESSION: No acute or reversible finding. Chronic small-vessel ischemic changes throughout the brain as outlined above. No finding specific for Parkinson's disease on this examination. See above discussion. Electronically Signed   By: Nelson Chimes M.D.   On: 04/15/2017 14:45      Assessment and Plan: Patient Active Problem List   Diagnosis Date Noted  . Type II diabetes mellitus with neurological manifestations ([place]) 02/02/2014  . Peripheral polyneuropathy 02/02/2014  . Microalbuminuria 02/02/2014  . Long-term insulin use ([place]) 02/02/2014  . Chronic kidney disease, stage III (moderate) ([place]) 08/15/2012  . Uric acid nephrolithiasis 08/14/2012  . Uncertain tumor of kidney and ureter 08/14/2012  . Incomplete emptying of bladder 08/14/2012  . Enlarged prostate with lower urinary tract symptoms (LUTS) 08/14/2012  . Elevated prostate specific antigen (PSA)  08/14/2012  . Acquired cyst of kidney 08/14/2012  . Benign localized hyperplasia of prostate with urinary obstruction and other lower urinary tract symptoms (LUTS)(600.21) 08/14/2012    1. COPD severe disease at this time will continue with supportive care. Will place on duonebs at home I also discussed advanced directives with him. He is going to talk to his attorney to get this taken care of 2. SOB baseline does not want aggressive measures 3. CAD followed by cardiology 4. CKD Stage III stable follow labs  General Counseling: I have discussed the findings of the evaluation and examination with Jeani Hawking.  I have also discussed any further diagnostic evaluation thatmay be needed or ordered today. Davyd verbalizes understanding of the findings of todays visit. We also reviewed his medications today and discussed drug interactions and side effects including but not limited excessive drowsiness and altered mental states. We also discussed that there  is always a risk not just to him but also people around him. he has been encouraged to call the office with any questions or concerns that should arise related to todays visit.    Time spent: 1min  I have personally obtained a history, examined the patient, evaluated laboratory and imaging results, formulated the assessment and plan and placed orders.    Allyne Gee, MD [facility] Pulmonary and Critical Care Sleep medicine

## 2017-05-08 ENCOUNTER — Telehealth: Payer: Self-pay

## 2017-05-08 NOTE — Telephone Encounter (Signed)
completed forms and faxed rx for nebulizer and supplies and medication to [facility].  dbs

## 2017-09-05 ENCOUNTER — Ambulatory Visit (INDEPENDENT_AMBULATORY_CARE_PROVIDER_SITE_OTHER): Payer: Medicare Other | Admitting: Urology

## 2017-09-05 ENCOUNTER — Encounter: Payer: Self-pay | Admitting: Urology

## 2017-09-05 VITALS — BP 129/66 | HR 67 | Ht 72.0 in | Wt 146.8 lb

## 2017-09-05 DIAGNOSIS — R972 Elevated prostate specific antigen [PSA]: Secondary | ICD-10-CM | POA: Diagnosis not present

## 2017-09-05 NOTE — Progress Notes (Signed)
09/05/2017 10:07 AM   Todd Davidson 10-24-1935 [id number]  Referring provider: Perrin Maltese, MD [address] [place], [place] [address]  Chief Complaint  Patient presents with  . Elevated PSA    HPI: 82 year old male with a long history of an elevated PSA.  He had been followed for several years by [name] with a PSA in the 5-7 range.  He saw Dr. Pilar Jarvis in 2018 and his PSA had increased to 13.  He subsequently went back to [name] at [facility] who started him on finasteride.  He last saw [name] March 2019 and his uncorrected PSA was 8.78.  He has intermittent lower urinary tract symptoms with variable nocturia of 2-4 times per night.  He notes occasional urinary hesitancy.  He denies dysuria or gross hematuria.   PMH: Past Medical History:  Diagnosis Date  . Arthritis    neck, shoulders, hips, knees  . Asthma   . Barrett esophagus   . BPH (benign prostatic hyperplasia)   . Coronary artery disease    annual stress with Dr. Humphrey Rolls. no new findings  . Depression   . Diabetes mellitus without complication ([place])   . GERD (gastroesophageal reflux disease)   . Hypercholesteremia   . Hypertension   . Kidney stone   . Neuromuscular disorder (HCC)    neuropathy - feet  . Occasional tremors    hands  . Renal insufficiency   . Sleep apnea    doesn't wear CPAP  . Wears dentures    full upper    Surgical History: Past Surgical History:  Procedure Laterality Date  . CARDIAC CATHETERIZATION  1999, 2000   stents placed  . CATARACT EXTRACTION W/PHACO Left 10/29/2014   Procedure: CATARACT EXTRACTION PHACO AND INTRAOCULAR LENS PLACEMENT (IOC);  Surgeon: Leandrew Koyanagi, MD;  Location: [facility];  Service: Ophthalmology;  Laterality: Left;  DIABETIC - insulin MALYUGIN  . CERVICAL FUSION    . ESOPHAGOGASTRODUODENOSCOPY (EGD) WITH PROPOFOL N/A 12/17/2015   Procedure: ESOPHAGOGASTRODUODENOSCOPY (EGD) WITH PROPOFOL;  Surgeon: [name], MD;  Location: [facility]  ENDOSCOPY;  Service: Endoscopy;  Laterality: N/A;  . HERNIA REPAIR    . JOINT REPLACEMENT     knee  . KNEE SURGERY Right    joint removal with fusion and graft  . NISSEN FUNDOPLICATION      Home Medications:  Allergies as of 09/05/2017      Reactions   Macrolides And Ketolides Swelling   "-mycin" drugs "-mycin" drugs "-mycin" drugs   Erythromycin    Other reaction(s): Unknown   Propranolol    Sulfa Antibiotics Swelling, Other (See Comments)   Other reaction(s): Other (See Comments)   Sulfasalazine Swelling   Tapentadol Other (See Comments)   Heavy tape causes raw skin   Telbivudine Swelling   "mycin" drugs   Tape Other (See Comments), Rash   Heavy tape causes raw skin      Medication List        Accurate as of 09/05/17 10:07 AM. Always use your most recent med list.          ACCU-CHEK AVIVA PLUS test strip Generic drug:  glucose blood USE 1 STRIP TO CHECK GLUCOSE 4 TIMES DAILY   amLODipine 10 MG tablet Commonly known as:  NORVASC Take 10 mg by mouth daily. AM   aspirin 325 MG tablet Take 325 mg by mouth daily. AM   benazepril 40 MG tablet Commonly known as:  LOTENSIN   citalopram 10 MG tablet  Commonly known as:  CELEXA   fexofenadine 180 MG tablet Commonly known as:  ALLEGRA Take by mouth.   finasteride 5 MG tablet Commonly known as:  PROSCAR   gabapentin 300 MG capsule Commonly known as:  NEURONTIN Take 300 mg by mouth 2 (two) times daily.   HUMALOG MIX 75/25 KWIKPEN (75-25) 100 UNIT/ML Kwikpen Generic drug:  Insulin Lispro Prot & Lispro 28 Units Two (2) times a day (30 minutes before a meal). 28 units in evening, 12 units in the morning.   insulin aspart protamine- aspart (70-30) 100 UNIT/ML injection Commonly known as:  NOVOLOG MIX 70/30 Inject 12-28 Units into the skin 2 (two) times daily with a meal. 12 units before breakfast and 28 units before dinner   ipratropium-albuterol 0.5-2.5 (3) MG/3ML Soln Commonly known as:  DUONEB Take 3 mLs  by nebulization every 6 (six) hours as needed.   Melatonin 10 MG Tabs Take by mouth.   naproxen sodium 220 MG tablet Commonly known as:  ALEVE Take 220 mg by mouth.   nitroGLYCERIN 0.4 MG SL tablet Commonly known as:  NITROSTAT Place under the tongue.   omeprazole 40 MG capsule Commonly known as:  PRILOSEC Take 40 mg by mouth daily. PM   rosuvastatin 40 MG tablet Commonly known as:  CRESTOR   SYMBICORT 80-4.5 MCG/ACT inhaler Generic drug:  budesonide-formoterol   tamsulosin 0.4 MG Caps capsule Commonly known as:  FLOMAX Take 0.4 mg by mouth daily after breakfast.   traMADol 50 MG tablet Commonly known as:  ULTRAM Take 50 mg by mouth every 6 (six) hours as needed.       Allergies:  Allergies  Allergen Reactions  . Macrolides And Ketolides Swelling    "-mycin" drugs "-mycin" drugs "-mycin" drugs   . Erythromycin     Other reaction(s): Unknown  . Propranolol   . Sulfa Antibiotics Swelling and Other (See Comments)    Other reaction(s): Other (See Comments)  . Sulfasalazine Swelling  . Tapentadol Other (See Comments)    Heavy tape causes raw skin  . Telbivudine Swelling    "mycin" drugs   . Tape Other (See Comments) and Rash    Heavy tape causes raw skin    Family History: Family History  Problem Relation Age of Onset  . Prostate cancer Father   . Heart disease Brother   . Stroke Mother     Social History:  reports that he quit smoking about 44 years ago. He has never used smokeless tobacco. He reports that he drinks about 1.2 oz of alcohol per week. He reports that he does not use drugs.  ROS: UROLOGY Frequent Urination?: Yes Hard to postpone urination?: No Burning/pain with urination?: No Get up at night to urinate?: Yes Leakage of urine?: No Urine stream starts and stops?: Yes Trouble starting stream?: Yes Do you have to strain to urinate?: Yes Blood in urine?: No Urinary tract infection?: No Sexually transmitted disease?: No Injury to  kidneys or bladder?: No Painful intercourse?: No Weak stream?: Yes Erection problems?: Yes Penile pain?: No  Gastrointestinal Nausea?: No Vomiting?: No Indigestion/heartburn?: No Diarrhea?: No Constipation?: Yes  Constitutional Fever: No Night sweats?: No Weight loss?: Yes Fatigue?: Yes  Skin Skin rash/lesions?: Yes Itching?: No  Eyes Blurred vision?: No Double vision?: No  Ears/Nose/Throat Sore throat?: No Sinus problems?: No  Hematologic/Lymphatic Swollen glands?: Yes Easy bruising?: Yes  Cardiovascular Leg swelling?: No Chest pain?: No  Respiratory Cough?: No Shortness of breath?: Yes  Endocrine Excessive thirst?: No  Musculoskeletal Back pain?: No Joint pain?: Yes  Neurological Headaches?: No Dizziness?: Yes  Psychologic Depression?: Yes Anxiety?: Yes  Physical Exam: BP 129/66 (BP Location: Left Arm, Patient Position: Sitting, Cuff Size: Normal)   Pulse 67   Ht 6' (1.829 m)   Wt 146 lb 12.8 oz (66.6 kg)   BMI 19.91 kg/m   Constitutional:  Alert and oriented, No acute distress. HEENT: [place] AT, moist mucus membranes.  Trachea midline, no masses. Cardiovascular: No clubbing, cyanosis, or edema. Respiratory: Normal respiratory effort, no increased work of breathing. GI: Abdomen is soft, nontender, nondistended, no abdominal masses GU: No CVA tenderness.  Prostate 50 g, L>R without nodularity. Lymph: No cervical or inguinal lymphadenopathy. Skin: No rashes, bruises or suspicious lesions. Neurologic: Grossly intact, no focal deficits, moving all 4 extremities. Psychiatric: Normal mood and affect.   Assessment & Plan:   82 year old male with a long history of an elevated PSA. His most recent uncorrected PSA was above baseline.  His PSA will be repeated today.  I did discuss prostate biopsy should his PSA continue to rise and he would like to think this over.  If stable he will follow-up in 6 months.   Abbie Sons, [place] [address] [place], [place] [address] [phone]

## 2017-09-06 ENCOUNTER — Telehealth: Payer: Self-pay

## 2017-09-06 LAB — PSA: Prostate Specific Ag, Serum: 6.6 ng/mL — ABNORMAL HIGH (ref 0.0–4.0)

## 2017-09-06 NOTE — Telephone Encounter (Signed)
Left detailed message.   

## 2017-09-06 NOTE — Telephone Encounter (Signed)
-----   Message from Abbie Sons, MD sent at 09/06/2017  7:31 AM EDT ----- PSA stable at 6.6.  Keep scheduled follow-up.

## 2017-09-07 ENCOUNTER — Ambulatory Visit: Payer: Self-pay | Admitting: Internal Medicine

## 2017-09-28 ENCOUNTER — Telehealth: Payer: Self-pay | Admitting: Urology

## 2017-09-28 DIAGNOSIS — N400 Enlarged prostate without lower urinary tract symptoms: Secondary | ICD-10-CM

## 2017-09-28 DIAGNOSIS — R972 Elevated prostate specific antigen [PSA]: Secondary | ICD-10-CM

## 2017-09-28 NOTE — Telephone Encounter (Signed)
Pt would like to get a year supply on refills to [facility] for Finasteride 5 mg 90 pills 3 month supply w/3 refills and Tamsulosin 0.4 mg with same refills.  Please give pt a call and let him know when this has been done.  They are currently at [facility] on [address].  [employer] is cheaper for pt.

## 2017-09-29 MED ORDER — TAMSULOSIN HCL 0.4 MG PO CAPS: 0.4000 mg | ORAL_CAPSULE | Freq: Every day | ORAL | 3 refills | Status: DC

## 2017-09-29 MED ORDER — FINASTERIDE 5 MG PO TABS: 5.0000 mg | ORAL_TABLET | Freq: Every day | ORAL | 3 refills | Status: DC

## 2017-09-29 NOTE — Telephone Encounter (Signed)
Called pt informed him that prescriptions have been sent into mail order. Pt gave verbal understanding.

## 2017-09-29 NOTE — Addendum Note (Signed)
Addended by: Donalee Citrin on: 09/29/2017 10:07 AM   Modules accepted: Orders

## 2017-10-09 ENCOUNTER — Ambulatory Visit (INDEPENDENT_AMBULATORY_CARE_PROVIDER_SITE_OTHER): Payer: Medicare Other | Admitting: Internal Medicine

## 2017-10-09 ENCOUNTER — Encounter: Payer: Self-pay | Admitting: Internal Medicine

## 2017-10-09 VITALS — BP 142/68 | HR 70 | Resp 16 | Ht 72.0 in | Wt 147.0 lb

## 2017-10-09 DIAGNOSIS — R0602 Shortness of breath: Secondary | ICD-10-CM | POA: Diagnosis not present

## 2017-10-09 DIAGNOSIS — I2583 Coronary atherosclerosis due to lipid rich plaque: Secondary | ICD-10-CM | POA: Diagnosis not present

## 2017-10-09 DIAGNOSIS — I2510 Atherosclerotic heart disease of native coronary artery without angina pectoris: Secondary | ICD-10-CM | POA: Diagnosis not present

## 2017-10-09 DIAGNOSIS — N183 Chronic kidney disease, stage 3 unspecified: Secondary | ICD-10-CM

## 2017-10-09 DIAGNOSIS — J449 Chronic obstructive pulmonary disease, unspecified: Secondary | ICD-10-CM | POA: Diagnosis not present

## 2017-10-09 NOTE — Progress Notes (Signed)
[facility] [address] [place],  [address]  Pulmonary Sleep Medicine   Office Visit Note  Patient Name: Todd Davidson DOB: 01-May-1935 MRN [id number]  Date of Service: 10/09/2017  Complaints/HPI:  Patient is here for follow-up of COPD.  Generally he has been doing fairly well.  Patient states he has not had any 8 patient's the hospital.  No exacerbations have been noted.  Patient denies having any cough or congestion.  No chest pain as noted.  Denies fevers or chills.  No nausea no vomiting.  Also denies having any edema in his legs.  ROS  General: (-) fever, (-) chills, (-) night sweats, (-) weakness Skin: (-) rashes, (-) itching,. Eyes: (-) visual changes, (-) redness, (-) itching. Nose and Sinuses: (-) nasal stuffiness or itchiness, (-) postnasal drip, (-) nosebleeds, (-) sinus trouble. Mouth and Throat: (-) sore throat, (-) hoarseness. Neck: (-) swollen glands, (-) enlarged thyroid, (-) neck pain. Respiratory: + cough, (-) bloody sputum, + shortness of breath, - wheezing. Cardiovascular: - ankle swelling, (-) chest pain. Lymphatic: (-) lymph node enlargement. Neurologic: (-) numbness, (-) tingling. Psychiatric: (-) anxiety, (-) depression   Current Medication: Outpatient Encounter Medications as of 10/09/2017  Medication Sig Note  . ACCU-CHEK AVIVA PLUS test strip USE 1 STRIP TO CHECK GLUCOSE 4 TIMES DAILY   . amLODipine (NORVASC) 10 MG tablet Take 10 mg by mouth daily. AM   . aspirin 325 MG tablet Take 325 mg by mouth daily. AM   . benazepril (LOTENSIN) 40 MG tablet    . budesonide-formoterol (SYMBICORT) 80-4.5 MCG/ACT inhaler    . citalopram (CELEXA) 10 MG tablet    . fexofenadine (ALLEGRA) 180 MG tablet Take by mouth.   . finasteride (PROSCAR) 5 MG tablet Take 1 tablet (5 mg total) by mouth daily.   Marland Kitchen gabapentin (NEURONTIN) 300 MG capsule Take 300 mg by mouth 2 (two) times daily.   . insulin aspart protamine- aspart (NOVOLOG MIX 70/30) (70-30)  100 UNIT/ML injection Inject 12-28 Units into the skin 2 (two) times daily with a meal. 12 units before breakfast and 28 units before dinner   . Insulin Lispro Prot & Lispro (HUMALOG MIX 75/25 KWIKPEN) (75-25) 100 UNIT/ML Kwikpen 28 Units Two (2) times a day (30 minutes before a meal). 28 units in evening, 12 units in the morning.   Marland Kitchen ipratropium-albuterol (DUONEB) 0.5-2.5 (3) MG/3ML SOLN Take 3 mLs by nebulization every 6 (six) hours as needed.   . Melatonin 10 MG TABS Take by mouth. 03/10/2016: Received from: [facility]: Take 10 mg by mouth nightly.  . naproxen sodium (ALEVE) 220 MG tablet Take 220 mg by mouth.   . nitroGLYCERIN (NITROSTAT) 0.4 MG SL tablet Place under the tongue. 03/10/2016: Received from: [facility]: Place 0.4 mg under the tongue every 5 (five) minutes as needed. Reported on 04/21/2015  . omeprazole (PRILOSEC) 40 MG capsule Take 40 mg by mouth daily. PM   . rosuvastatin (CRESTOR) 40 MG tablet    . tamsulosin (FLOMAX) 0.4 MG CAPS capsule Take 1 capsule (0.4 mg total) by mouth daily after breakfast.   . traMADol (ULTRAM) 50 MG tablet Take 50 mg by mouth every 6 (six) hours as needed.    No facility-administered encounter medications on file as of 10/09/2017.     Surgical History: Past Surgical History:  Procedure Laterality Date  . CARDIAC CATHETERIZATION  1999, 2000   stents placed  . CATARACT EXTRACTION W/PHACO  Left 10/29/2014   Procedure: CATARACT EXTRACTION PHACO AND INTRAOCULAR LENS PLACEMENT (IOC);  Surgeon: Leandrew Koyanagi, MD;  Location: [facility];  Service: Ophthalmology;  Laterality: Left;  DIABETIC - insulin MALYUGIN  . CERVICAL FUSION    . ESOPHAGOGASTRODUODENOSCOPY (EGD) WITH PROPOFOL N/A 12/17/2015   Procedure: ESOPHAGOGASTRODUODENOSCOPY (EGD) WITH PROPOFOL;  Surgeon: [name], MD;  Location: [facility] ENDOSCOPY;  Service: Endoscopy;  Laterality: N/A;  . HERNIA REPAIR    . JOINT  REPLACEMENT     knee  . KNEE SURGERY Right    joint removal with fusion and graft  . NISSEN FUNDOPLICATION      Medical History: Past Medical History:  Diagnosis Date  . Arthritis    neck, shoulders, hips, knees  . Asthma   . Barrett esophagus   . BPH (benign prostatic hyperplasia)   . Coronary artery disease    annual stress with Dr. Humphrey Rolls. no new findings  . Depression   . Diabetes mellitus without complication ([place])   . GERD (gastroesophageal reflux disease)   . Hypercholesteremia   . Hypertension   . Kidney stone   . Neuromuscular disorder (HCC)    neuropathy - feet  . Occasional tremors    hands  . Renal insufficiency   . Sleep apnea    doesn't wear CPAP  . Wears dentures    full upper    Family History: Family History  Problem Relation Age of Onset  . Prostate cancer Father   . Heart disease Brother   . Stroke Mother     Social History: Social History   Socioeconomic History  . Marital status: Married    Spouse name: Not on file  . Number of children: Not on file  . Years of education: Not on file  . Highest education level: Not on file  Occupational History  . Not on file  Social Needs  . Financial resource strain: Not on file  . Food insecurity:    Worry: Not on file    Inability: Not on file  . Transportation needs:    Medical: Not on file    Non-medical: Not on file  Tobacco Use  . Smoking status: Former Smoker    Last attempt to quit: 03/21/1973    Years since quitting: 44.5  . Smokeless tobacco: Never Used  Substance and Sexual Activity  . Alcohol use: Yes    Alcohol/week: 1.2 oz    Types: 2 Cans of beer per week  . Drug use: No  . Sexual activity: Yes  Lifestyle  . Physical activity:    Days per week: Not on file    Minutes per session: Not on file  . Stress: Not on file  Relationships  . Social connections:    Talks on phone: Not on file    Gets together: Not on file    Attends religious service: Not on file    Active member  of club or organization: Not on file    Attends meetings of clubs or organizations: Not on file    Relationship status: Not on file  . Intimate partner violence:    Fear of current or ex partner: Not on file    Emotionally abused: Not on file    Physically abused: Not on file    Forced sexual activity: Not on file  Other Topics Concern  . Not on file  Social History Narrative  . Not on file    Vital Signs: Blood pressure (!) 142/68, pulse 70,  resp. rate 16, height 6' (1.829 m), weight 147 lb (66.7 kg), SpO2 95 %.  Examination: General Appearance: The patient is well-developed, well-nourished, and in no distress. Skin: Gross inspection of skin unremarkable. Head: normocephalic, no gross deformities. Eyes: no gross deformities noted. ENT: ears appear grossly normal no exudates. Neck: Supple. No thyromegaly. No LAD. Respiratory: few distant rhonchi. Cardiovascular: Normal S1 and S2 without murmur or rub. Extremities: No cyanosis. pulses are equal. Neurologic: Alert and oriented. No involuntary movements.  LABS: Recent Results (from the past 2160 hour(s))  PSA     Status: Abnormal   Collection Time: 09/05/17 10:00 AM  Result Value Ref Range   Prostate Specific Ag, Serum 6.6 (H) 0.0 - 4.0 ng/mL    Comment: Roche ECLIA methodology. According to the American Urological Association, Serum PSA should decrease and remain at undetectable levels after radical prostatectomy. The AUA defines biochemical recurrence as an initial PSA value 0.2 ng/mL or greater followed by a subsequent confirmatory PSA value 0.2 ng/mL or greater. Values obtained with different assay methods or kits cannot be used interchangeably. Results cannot be interpreted as absolute evidence of the presence or absence of malignant disease.     Radiology: Mr Brain Wo Contrast  Result Date: 04/15/2017 CLINICAL DATA:  Tremors.  History of Parkinson's disease. EXAM: MRI HEAD WITHOUT CONTRAST TECHNIQUE: Multiplanar,  multiecho pulse sequences of the brain and surrounding structures were obtained without intravenous contrast. COMPARISON:  Head CT 07/26/2014.  No previous brain MRI. FINDINGS: Brain: Diffusion imaging does not show any acute or subacute infarction. There chronic small-vessel ischemic changes of the pons. 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 The mid brain does not show any abnormality by standard MR. No sign of volume loss in the substantia nigra region. High-resolution susceptibility weighted imaging is necessary to suggest Parkinson's disease by MRI, which was not performed. Positive findings on that sequence are only confirmatory and not primarily diagnostic. Cerebral hemispheres show an old lacunar infarction in the right caudate head and mild chronic small-vessel ischemic change of the white matter. No cortical or large vessel territory infarction. No mass lesion, hemorrhage, hydrocephalus or extra-axial collection. Vascular: Major vessels at the base of the brain show flow. Skull and upper cervical spine: Normal Sinuses/Orbits: Clear except for some mucosal thickening in the region of the left maxillary sinus and nasal passages. Other: None IMPRESSION: No acute or reversible finding. Chronic small-vessel ischemic changes throughout the brain as outlined above. No finding specific for Parkinson's disease on this examination. See above discussion. Electronically Signed   By: Nelson Chimes M.D.   On: 04/15/2017 14:45    No results found.  No results found.    Assessment and Plan: Patient Active Problem List   Diagnosis Date Noted  . Impingement syndrome of shoulder region 03/17/2016  . Type II diabetes mellitus with neurological manifestations ([place]) 02/02/2014  . Peripheral polyneuropathy 02/02/2014  . Microalbuminuria 02/02/2014  . Long-term insulin use ([place]) 02/02/2014  . Chronic kidney disease, stage III (moderate) ([place]) 08/15/2012  . Uric acid  nephrolithiasis 08/14/2012  . Uncertain tumor of kidney and ureter 08/14/2012  . Incomplete emptying of bladder 08/14/2012  . Enlarged prostate with lower urinary tract symptoms (LUTS) 08/14/2012  . Elevated prostate specific antigen (PSA) 08/14/2012  . Acquired cyst of kidney 08/14/2012  . Benign localized hyperplasia of prostate with urinary obstruction and other lower urinary tract symptoms (LUTS)(600.21) 08/14/2012    1. COPD/Asthma  Spirometry was done in the  office actually looks good today.  He continues to uses inhalers as prescribed.  Medications were reviewed at this time. 2. SOB  States he is at baseline doing fairly well has been able to tolerate fairly well 3. CAD stable no active chest pain 4. CKD stage III continues to follow with his nephrologist  General Counseling: I have discussed the findings of the evaluation and examination with Jeani Hawking.  I have also discussed any further diagnostic evaluation thatmay be needed or ordered today. Donley verbalizes understanding of the findings of todays visit. We also reviewed his medications today and discussed drug interactions and side effects including but not limited excessive drowsiness and altered mental states. We also discussed that there is always a risk not just to him but also people around him. he has been encouraged to call the office with any questions or concerns that should arise related to todays visit.    Time spent: 58min  I have personally obtained a history, examined the patient, evaluated laboratory and imaging results, formulated the assessment and plan and placed orders.    Allyne Gee, MD [facility] Pulmonary and Critical Care Sleep medicine

## 2017-10-09 NOTE — Patient Instructions (Signed)

## 2017-12-23 DIAGNOSIS — D696 Thrombocytopenia, unspecified: Secondary | ICD-10-CM | POA: Insufficient documentation

## 2017-12-23 NOTE — Progress Notes (Signed)
[facility] day:  12/25/2017  Chief Complaint: Todd Davidson is a 82 y.o. adult with thrombocytopenia who is referred in consultation by  Evern Bio, NP for assessment and management.  HPI:  The patient denies any history of thrombocytopenia.  He notes that he bruises all of the time.  He takes aspirin 325 mg a day for his heart.  He denies any new medications or herbal products.  He denies any alcohol use.  He denies any history of hepatitis.  He had a PRBC transfusion with his knee fusion in 1973/1974.  Patient has had mild chronic thrombocytopenia dating back to 06/16/2012.    Platelet count has been followed:  136,000 on 06/16/2012, 114,000 on 07/26/2014, 116,000 on 04/06/2015, 120,000 on 11/16/2017, and 141,000 on 11/16/2017.  CBC on 11/16/2017 revealed a hematocrit of 43.2, hemoglobin 14.6, MCV 89.1, platelets 120,000, and WBC 7500.  CMP revealed a creatinine of 1.0, albumen 4.3, calcium 9.5, and normal LFTs.  CBC on 12/13/2017 revealed a hematocrit of 41.3, hemoglobin 13.9, MCV 86, platelets 141,000, and WBC 7600.  Symptomatically, he notes a weight loss of 55 pounds in 4-5 years (195 pounds to 140 pounds).  Over the last 2 months, weight has improved to 140 pounds.  He denies any nausea, vomiting or diarrhea.  He describes constipation.  He notes that his appetite is poor.  He eats 1/2 portions.  He denies any melena or hematochezia.   Past Medical History:  Diagnosis Date  . Arthritis    neck, shoulders, hips, knees  . Asthma   . Barrett esophagus   . BPH (benign prostatic hyperplasia)   . Coronary artery disease    annual stress with Dr. Humphrey Rolls. no new findings  . Depression   . Diabetes mellitus without complication ([place])   . Diverticulosis   . Duodenitis   . Gastritis   . GERD (gastroesophageal reflux disease)   . Hypercholesteremia   . Hypertension   . Kidney stone   . Neuromuscular disorder (HCC)    neuropathy - feet   . Occasional tremors    hands  . Renal insufficiency   . Sleep apnea    doesn't wear CPAP  . Wears dentures    full upper    Past Surgical History:  Procedure Laterality Date  . APPENDECTOMY    . CARDIAC CATHETERIZATION  1999, 2000   stents placed  . CATARACT EXTRACTION W/PHACO Left 10/29/2014   Procedure: CATARACT EXTRACTION PHACO AND INTRAOCULAR LENS PLACEMENT (IOC);  Surgeon: Leandrew Koyanagi, MD;  Location: [facility];  Service: Ophthalmology;  Laterality: Left;  DIABETIC - insulin MALYUGIN  . CERVICAL FUSION    . ESOPHAGOGASTRODUODENOSCOPY N/A 12/29/2017   Procedure: ESOPHAGOGASTRODUODENOSCOPY (EGD);  Surgeon: [name], MD;  Location: [facility];  Service: Endoscopy;  Laterality: N/A;  . ESOPHAGOGASTRODUODENOSCOPY (EGD) WITH PROPOFOL N/A 12/17/2015   Procedure: ESOPHAGOGASTRODUODENOSCOPY (EGD) WITH PROPOFOL;  Surgeon: [name], MD;  Location: [facility] ENDOSCOPY;  Service: Endoscopy;  Laterality: N/A;  . EYE SURGERY    . HERNIA REPAIR    . JOINT REPLACEMENT     knee  . KNEE SURGERY Right    joint removal with fusion and graft  . NISSEN FUNDOPLICATION      Family History  Problem Relation Age of Onset  . Prostate cancer Father   . Heart disease Brother   . Stroke Mother   . Breast cancer Sister     Social History:  reports  that he quit smoking about 44 years ago. His smoking use included cigarettes, pipe, and cigars. He has a 20.00 pack-year smoking history. His smokeless tobacco use includes chew. He reports that he drank alcohol. He reports that he does not use drugs. He occasionally chews tobacco.  He denies any exposure to radiation or toxins.  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  He lives in [place]. The patient is alone today.  Allergies:  Allergies  Allergen Reactions  . Macrolides And Ketolides Swelling    "-mycin" drugs "-mycin" drugs "-mycin" drugs   . Erythromycin      Other reaction(s): Unknown  . Propranolol   . Sulfa Antibiotics Swelling and Other (See Comments)    Other reaction(s): Other (See Comments)  . Sulfasalazine Swelling  . Tapentadol Other (See Comments)    Heavy tape causes raw skin  . Telbivudine Swelling    "mycin" drugs   . Tape Other (See Comments) and Rash    Heavy tape causes raw skin    Current Medications: Current Outpatient Medications  Medication Sig Dispense Refill  . amLODipine (NORVASC) 10 MG tablet Take 10 mg by mouth daily. AM    . aspirin 325 MG tablet Take 325 mg by mouth daily. AM    . benazepril (LOTENSIN) 40 MG tablet     . citalopram (CELEXA) 10 MG tablet Take 10 mg by mouth daily.     . fexofenadine (ALLEGRA) 180 MG tablet Take by mouth.    . finasteride (PROSCAR) 5 MG tablet Take 1 tablet (5 mg total) by mouth daily. 90 tablet 3  . gabapentin (NEURONTIN) 300 MG capsule Take 300 mg by mouth 2 (two) times daily.    . insulin aspart protamine- aspart (NOVOLOG MIX 70/30) (70-30) 100 UNIT/ML injection Inject 12-28 Units into the skin 2 (two) times daily with a meal. 12 units before breakfast and 28 units before dinner    . ipratropium-albuterol (DUONEB) 0.5-2.5 (3) MG/3ML SOLN Take 3 mLs by nebulization every 6 (six) hours as needed. 360 mL 4  . Melatonin 10 MG TABS Take by mouth.    Marland Kitchen omeprazole (PRILOSEC) 40 MG capsule Take 40 mg by mouth daily. PM    . rosuvastatin (CRESTOR) 40 MG tablet Take 40 mg by mouth daily.     . tamsulosin (FLOMAX) 0.4 MG CAPS capsule Take 1 capsule (0.4 mg total) by mouth daily after breakfast. 90 capsule 3  . ACCU-CHEK AVIVA PLUS test strip USE 1 STRIP TO CHECK GLUCOSE 4 TIMES DAILY  1  . budesonide-formoterol (SYMBICORT) 80-4.5 MCG/ACT inhaler Inhale 2 puffs into the lungs.     . docusate sodium (COLACE) 100 MG capsule Take 100 mg by mouth daily.    . furosemide (LASIX) 20 MG tablet Take 20 mg by mouth.    Marland Kitchen guaiFENesin (MUCINEX) 600 MG 12 hr tablet Take 600 mg by mouth 2 (two) times  daily.    . hydrALAZINE (APRESOLINE) 25 MG tablet hydralazine 25 mg tablet    . insulin NPH-regular Human (NOVOLIN 70/30) (70-30) 100 UNIT/ML injection Inject 5 Units into the skin daily with breakfast.    . insulin NPH-regular Human (NOVOLIN 70/30) (70-30) 100 UNIT/ML injection Inject 10 Units into the skin daily with supper.    . magnesium 30 MG tablet Take 30 mg by mouth 2 (two) times daily.    . magnesium oxide (MAG-OX) 400 MG tablet Take 400 mg by mouth daily.    Marland Kitchen  mupirocin ointment (BACTROBAN) 2 % Place 1 application into the nose 2 (two) times daily.    . naproxen sodium (ALEVE) 220 MG tablet Take 220 mg by mouth.    . nitroGLYCERIN (NITROSTAT) 0.4 MG SL tablet Place under the tongue.    . polyethylene glycol (MIRALAX / GLYCOLAX) packet Take 17 g by mouth daily.    Marland Kitchen saccharomyces boulardii (FLORASTOR) 250 MG capsule Take 250 mg by mouth 2 (two) times daily.    . traMADol (ULTRAM) 50 MG tablet Take 50 mg by mouth every 6 (six) hours as needed.     No current facility-administered medications for this visit.     Review of Systems:  GENERAL:  Feels "ok".  No fevers, sweats.  Weight loss of 55 pounds in 4-5 years (weight gain of 5 pounds in past 2 months). PERFORMANCE STATUS (ECOG):  1 HEENT:  No visual changes, runny nose, sore throat, mouth sores or tenderness. Lungs: No shortness of breath or cough.  No hemoptysis. Cardiac:  No chest pain, palpitations, orthopnea, or PND. GI:  Poor appetite.  Constipation.  No nausea, vomiting, diarrhea,melena or hematochezia. GU:  No urgency, frequency, dysuria, or hematuria. Musculoskeletal:  No back pain.  Right knee fused.  Left shoulder and neck pain.  No muscle tenderness. Extremities:  No pain or swelling. Skin:  Fragile skin.  No rashes or skin changes. Neuro:  Little headache (frontal).  No numbness or weakness, balance or coordination issues. Endocrine:  Diabetes.  No thyroid issues, hot flashes or night sweats. Psych:  No mood changes,  depression or anxiety. Pain:  No focal pain. Review of systems:  All other systems reviewed and found to be negative.  Physical Exam: Blood pressure (!) 148/72, pulse 66, temperature (!) 96.8 F (36 C), temperature source Tympanic, resp. rate 18, height 5' 9.69" (1.77 m), weight 145 lb 12.8 oz (66.1 kg). GENERAL:  Well developed, well nourished, gentleman sitting comfortably in the exam room in no acute distress.  He has a cane at his side. MENTAL STATUS:  Alert and oriented to person, place and time. HEAD:  Dark blonde hair.  Normocephalic, atraumatic, face symmetric, no Cushingoid features. EYES:  Blue eyes.  Pupils equal round and reactive to light and accomodation.  No conjunctivitis or scleral icterus. ENT:  Oropharynx clear without lesion.  Tongue normal. Mucous membranes moist.  RESPIRATORY:  Clear to auscultation without rales, wheezes or rhonchi. CARDIOVASCULAR:  Regular rate and rhythm without murmur, rub or gallop. ABDOMEN:  Soft, non-tender, with active bowel sounds, and no hepatosplenomegaly.  No masses. SKIN:  No rashes, ulcers or lesions. EXTREMITIES: Right knee extended s/p fusion.  No edema, no skin discoloration or tenderness.  No palpable cords. LYMPH NODES: No palpable cervical, supraclavicular, axillary or inguinal adenopathy  NEUROLOGICAL: Unremarkable. PSYCH:  Appropriate.   Orders Only on 12/25/2017  Component Date Value Ref Range Status  . aPTT 12/25/2017 28  24 - 36 seconds Final   Performed at [facility], [address] [place], [place] [address]  . Prothrombin Time 12/25/2017 12.3  11.4 - 15.2 seconds Final  . INR 12/25/2017 0.92   Final   Performed at [facility], [address] [place], [place] [address]  . HIV Screen 4th Generation wRfx 12/25/2017 Non Reactive  Non Reactive Final   Comment: (NOTE) Performed At: [facility] [address] [place], [place] [id number] Rush Farmer MD [id number]   . HCV Ab 12/25/2017  <0.1  0.0 - 0.9 s/co ratio  Final   Comment: (NOTE)                                  Negative:     < 0.8                             Indeterminate: 0.8 - 0.9                                  Positive:     > 0.9 The CDC recommends that a positive HCV antibody result be followed up with a HCV Nucleic Acid Amplification test ([id number]). Performed At: [facility] [place], [place] [id number] Rush Farmer MD [id number]   . Hep B Core Total Ab 12/25/2017 Negative  Negative Final   Comment: (NOTE) Performed At: [facility] [place], [place] [id number] Rush Farmer MD [id number]   . TSH 12/25/2017 2.790  0.350 - 4.500 uIU/mL Final   Comment: Performed by a 3rd Generation assay with a functional sensitivity of <=0.01 uIU/mL. Performed at [facility], [address] [place], [place] [address]   . Folate 12/25/2017 10.2  >5.9 ng/mL Final   Performed at [facility], [address] [place], [place] [address]  . Vitamin B-12 12/25/2017 790  180 - 914 pg/mL Final   Comment: (NOTE) This assay is not validated for testing neonatal or myeloproliferative syndrome specimens for Vitamin B12 levels. Performed at [facility], [place] [address], [place], [place] [address]   . Anti Nuclear Antibody(ANA) 12/25/2017 Negative  Negative Final   Comment: (NOTE) Performed At: [facility] [place], [place] [id number] Rush Farmer MD [id number]   . Platelet CT in [place] 12/25/2017 107   Final   Performed at [facility], [address], [place], [place] [address]  . WBC 12/25/2017 6.6  3.8 - 10.6 K/uL Final  . RBC 12/25/2017 4.58  4.40 - 5.90 MIL/uL Final  . Hemoglobin 12/25/2017 14.1  13.0 - 18.0 g/dL Final  . HCT 12/25/2017 40.3  40.0 - 52.0 % Final  . MCV 12/25/2017 88.1  80.0 - 100.0 fL Final  . MCH 12/25/2017 30.8  26.0 - 34.0 pg Final  . MCHC 12/25/2017 35.0  32.0 - 36.0 g/dL Final   . RDW 12/25/2017 14.2  11.5 - 14.5 % Final  . Platelets 12/25/2017 124* 150 - 440 K/uL Final  . Neutrophils Relative % 12/25/2017 63  % Final  . Neutro Abs 12/25/2017 4.2  1.4 - 6.5 K/uL Final  . Lymphocytes Relative 12/25/2017 24  % Final  . Lymphs Abs 12/25/2017 1.6  1.0 - 3.6 K/uL Final  . Monocytes Relative 12/25/2017 6  % Final  . Monocytes Absolute 12/25/2017 0.4  0.2 - 1.0 K/uL Final  . Eosinophils Relative 12/25/2017 6  % Final  . Eosinophils Absolute 12/25/2017 0.4  0 - 0.7 K/uL Final  . Basophils Relative 12/25/2017 1  % Final  . Basophils Absolute 12/25/2017 0.0  0 - 0.1 K/uL Final   Performed at [facility], [address] [place],  [address]    Assessment:  Todd Davidson is a 82 y.o. adult with mild thrombocytopenia dating back to 06/16/2012.  Platelet count has ranged between 114,000 - 141,000 without trend.  He denies any history of hepatitis.  He denies any new medications or herbal products.  None of his current medications are implicated.   He has a history of an elevated PSA.  PSA was 13 on 05/31/2016 and 6.6 on 09/05/2017.  EGD on 12/17/2015 revealed Barrett's esophagus without dysplasia.  Colonoscopy in 2014 revealed diverticulosis.  Symptomatically, he has lost 55 pounds in 4-5 years (over the last 2 months, weight has increased by 5 pounds).  He has a poor appetite.  Exam is unremarkable.    Plan: 1.  Labs today:  CBC with diff, platelet count in blue top tube, ANA with reflex, B12, folate, TSH, hepatitis B core antibody, hepatitis C antibody, HIV testing, PT, PTT. 2.  Thrombocytopenia:  Discuss differential diagnosis of thrombocytopenia (decreased production, sequestration, increased destruction, and pseudo-thrombocytopenia).    Platelet count has been mildly low for at least 5 years.    No medications or herbal products indicated.  Distant history of PRBC transfusion.  Discuss initial work-up. 3.  Weight loss:  Discuss concern for slow  unexplained weight loss over the past 4-5 years.  Check thyroid function.  Consider further evaluation (PSA, endoscopy, imaging) if weight loss persists. 4.  RTC in 1 week for MD assessment, review of work-up, and discussion regarding direction of therapy.   Lequita Asal, MD  12/25/2017, 11:21 AM

## 2017-12-25 ENCOUNTER — Encounter: Payer: Self-pay | Admitting: Hematology and Oncology

## 2017-12-25 ENCOUNTER — Other Ambulatory Visit: Payer: Self-pay

## 2017-12-25 ENCOUNTER — Inpatient Hospital Stay: Payer: Medicare Other | Attending: Hematology and Oncology | Admitting: Hematology and Oncology

## 2017-12-25 ENCOUNTER — Inpatient Hospital Stay: Payer: Medicare Other

## 2017-12-25 ENCOUNTER — Encounter (INDEPENDENT_AMBULATORY_CARE_PROVIDER_SITE_OTHER): Payer: Self-pay

## 2017-12-25 VITALS — BP 148/72 | HR 66 | Temp 96.8°F | Resp 18 | Ht 69.69 in | Wt 145.8 lb

## 2017-12-25 DIAGNOSIS — E119 Type 2 diabetes mellitus without complications: Secondary | ICD-10-CM

## 2017-12-25 DIAGNOSIS — Z87891 Personal history of nicotine dependence: Secondary | ICD-10-CM

## 2017-12-25 DIAGNOSIS — I10 Essential (primary) hypertension: Secondary | ICD-10-CM

## 2017-12-25 DIAGNOSIS — D696 Thrombocytopenia, unspecified: Secondary | ICD-10-CM | POA: Diagnosis present

## 2017-12-25 DIAGNOSIS — R634 Abnormal weight loss: Secondary | ICD-10-CM

## 2017-12-25 LAB — CBC WITH DIFFERENTIAL/PLATELET
BASOS ABS: 0 10*3/uL (ref 0–0.1)
Basophils Relative: 1 %
EOS ABS: 0.4 10*3/uL (ref 0–0.7)
EOS PCT: 6 %
HCT: 40.3 % (ref 40.0–52.0)
HEMOGLOBIN: 14.1 g/dL (ref 13.0–18.0)
LYMPHS PCT: 24 %
Lymphs Abs: 1.6 10*3/uL (ref 1.0–3.6)
MCH: 30.8 pg (ref 26.0–34.0)
MCHC: 35 g/dL (ref 32.0–36.0)
MCV: 88.1 fL (ref 80.0–100.0)
Monocytes Absolute: 0.4 10*3/uL (ref 0.2–1.0)
Monocytes Relative: 6 %
NEUTROS PCT: 63 %
Neutro Abs: 4.2 10*3/uL (ref 1.4–6.5)
PLATELETS: 124 10*3/uL — AB (ref 150–440)
RBC: 4.58 MIL/uL (ref 4.40–5.90)
RDW: 14.2 % (ref 11.5–14.5)
WBC: 6.6 10*3/uL (ref 3.8–10.6)

## 2017-12-25 LAB — VITAMIN B12: Vitamin B-12: 790 pg/mL (ref 180–914)

## 2017-12-25 LAB — APTT: aPTT: 28 seconds (ref 24–36)

## 2017-12-25 LAB — PROTIME-INR
INR: 0.92
Prothrombin Time: 12.3 seconds (ref 11.4–15.2)

## 2017-12-25 LAB — TSH: TSH: 2.79 u[IU]/mL (ref 0.350–4.500)

## 2017-12-25 LAB — PLATELET BY CITRATE: PLATELET CT IN CITRATE: 107

## 2017-12-25 LAB — FOLATE: FOLATE: 10.2 ng/mL (ref >5.9)

## 2017-12-25 NOTE — Progress Notes (Signed)
Here for new pt evaluation.  

## 2017-12-26 LAB — HEPATITIS B CORE ANTIBODY, TOTAL: HEP B C TOTAL AB: NEGATIVE

## 2017-12-26 LAB — ANA W/REFLEX: Anti Nuclear Antibody(ANA): NEGATIVE

## 2017-12-26 LAB — HIV ANTIBODY (ROUTINE TESTING W REFLEX): HIV Screen 4th Generation wRfx: NONREACTIVE

## 2017-12-26 LAB — HEPATITIS C ANTIBODY: HCV Ab: <0.1 s/co ratio (ref 0.0–0.9)

## 2017-12-28 ENCOUNTER — Encounter: Payer: Self-pay | Admitting: Student

## 2017-12-29 ENCOUNTER — Ambulatory Visit: Payer: Medicare Other | Admitting: Anesthesiology

## 2017-12-29 ENCOUNTER — Ambulatory Visit
Admission: RE | Admit: 2017-12-29 | Discharge: 2017-12-29 | Disposition: A | Discharge status: Home / Self Care | Payer: Medicare Other | Source: Ambulatory Visit | Attending: Gastroenterology | Admitting: Gastroenterology

## 2017-12-29 ENCOUNTER — Encounter: Payer: Self-pay | Admitting: *Deleted

## 2017-12-29 ENCOUNTER — Encounter
Admission: RE | Disposition: A | Discharge status: Home / Self Care | Payer: Self-pay | Source: Ambulatory Visit | Attending: Gastroenterology

## 2017-12-29 DIAGNOSIS — K2270 Barrett's esophagus without dysplasia: Secondary | ICD-10-CM | POA: Insufficient documentation

## 2017-12-29 DIAGNOSIS — I10 Essential (primary) hypertension: Secondary | ICD-10-CM | POA: Diagnosis not present

## 2017-12-29 DIAGNOSIS — F329 Major depressive disorder, single episode, unspecified: Secondary | ICD-10-CM | POA: Diagnosis not present

## 2017-12-29 DIAGNOSIS — Z791 Long term (current) use of non-steroidal anti-inflammatories (NSAID): Secondary | ICD-10-CM | POA: Diagnosis not present

## 2017-12-29 DIAGNOSIS — Z79899 Other long term (current) drug therapy: Secondary | ICD-10-CM | POA: Insufficient documentation

## 2017-12-29 DIAGNOSIS — G4730 Sleep apnea, unspecified: Secondary | ICD-10-CM | POA: Insufficient documentation

## 2017-12-29 DIAGNOSIS — E7800 Pure hypercholesterolemia, unspecified: Secondary | ICD-10-CM | POA: Diagnosis not present

## 2017-12-29 DIAGNOSIS — Z955 Presence of coronary angioplasty implant and graft: Secondary | ICD-10-CM | POA: Insufficient documentation

## 2017-12-29 DIAGNOSIS — Z7951 Long term (current) use of inhaled steroids: Secondary | ICD-10-CM | POA: Diagnosis not present

## 2017-12-29 DIAGNOSIS — K210 Gastro-esophageal reflux disease with esophagitis: Secondary | ICD-10-CM | POA: Diagnosis not present

## 2017-12-29 DIAGNOSIS — I2510 Atherosclerotic heart disease of native coronary artery without angina pectoris: Secondary | ICD-10-CM | POA: Diagnosis not present

## 2017-12-29 DIAGNOSIS — Z87891 Personal history of nicotine dependence: Secondary | ICD-10-CM | POA: Diagnosis not present

## 2017-12-29 DIAGNOSIS — Z1389 Encounter for screening for other disorder: Secondary | ICD-10-CM | POA: Diagnosis present

## 2017-12-29 DIAGNOSIS — Z7982 Long term (current) use of aspirin: Secondary | ICD-10-CM | POA: Insufficient documentation

## 2017-12-29 DIAGNOSIS — J45909 Unspecified asthma, uncomplicated: Secondary | ICD-10-CM | POA: Diagnosis not present

## 2017-12-29 DIAGNOSIS — Z794 Long term (current) use of insulin: Secondary | ICD-10-CM | POA: Insufficient documentation

## 2017-12-29 DIAGNOSIS — E119 Type 2 diabetes mellitus without complications: Secondary | ICD-10-CM | POA: Insufficient documentation

## 2017-12-29 HISTORY — PX: ESOPHAGOGASTRODUODENOSCOPY: SHX5428

## 2017-12-29 HISTORY — DX: Diverticulosis of intestine, part unspecified, without perforation or abscess without bleeding: K57.90

## 2017-12-29 HISTORY — DX: Duodenitis without bleeding: K29.80

## 2017-12-29 HISTORY — DX: Gastritis, unspecified, without bleeding: K29.70

## 2017-12-29 LAB — GLUCOSE, CAPILLARY
GLUCOSE-CAPILLARY: 77 mg/dL (ref 70–99)
Glucose-Capillary: 73 mg/dL (ref 70–99)

## 2017-12-29 SURGERY — EGD (ESOPHAGOGASTRODUODENOSCOPY)
Anesthesia: General

## 2017-12-29 MED ORDER — PROPOFOL 500 MG/50ML IV EMUL
INTRAVENOUS | Status: DC | PRN
  Administered 2017-12-29: 100 ug/kg/min via INTRAVENOUS

## 2017-12-29 MED ORDER — SODIUM CHLORIDE 0.9 % IV SOLN
INTRAVENOUS | Status: DC
  Administered 2017-12-29: 1000 mL via INTRAVENOUS

## 2017-12-29 MED ORDER — FENTANYL CITRATE (PF) 100 MCG/2ML IJ SOLN
INTRAMUSCULAR | Status: AC
  Filled 2017-12-29: qty 2

## 2017-12-29 MED ORDER — SODIUM CHLORIDE 0.9 % IV SOLN
INTRAVENOUS | Status: DC
  Administered 2017-12-29: 08:00:00 via INTRAVENOUS

## 2017-12-29 MED ORDER — LIDOCAINE HCL (CARDIAC) PF 100 MG/5ML IV SOSY
PREFILLED_SYRINGE | INTRAVENOUS | Status: DC | PRN
  Administered 2017-12-29: 30 mg via INTRAVENOUS

## 2017-12-29 MED ORDER — FENTANYL CITRATE (PF) 100 MCG/2ML IJ SOLN
INTRAMUSCULAR | Status: DC | PRN
  Administered 2017-12-29: 50 ug via INTRAVENOUS

## 2017-12-29 MED ORDER — BUTAMBEN-TETRACAINE-BENZOCAINE 2-2-14 % EX AERO
INHALATION_SPRAY | CUTANEOUS | Status: DC | PRN
  Administered 2017-12-29: 2 via TOPICAL

## 2017-12-29 MED ORDER — LIDOCAINE HCL (PF) 2 % IJ SOLN
INTRAMUSCULAR | Status: AC
  Filled 2017-12-29: qty 10

## 2017-12-29 MED ORDER — PROPOFOL 500 MG/50ML IV EMUL
INTRAVENOUS | Status: AC
  Filled 2017-12-29: qty 50

## 2017-12-29 NOTE — H&P (Signed)
Outpatient short stay form Pre-procedure 12/29/2017 7:20 AM Todd Sails MD  Primary Physician: Dr. Lamonte Sakai  Reason for visit: EGD  History of present illness: Patient is a 82 year old male presenting today for EGD in regards to his personal history of Barrett's esophagus.  His last EGD was about 2 years ago.  He takes omeprazole 40 mg daily.  No aspirin or aspirin products for the past several days.  He takes no blood thinning agent otherwise.  There is a history of thrombocytopenia, however he had a 6 CBC done 4 days ago indicating platelet count of 124.  This is stable over the past several years.    Current Facility-Administered Medications:  .  0.9 %  sodium chloride infusion, , Intravenous, Continuous, Todd Sails, MD .  0.9 %  sodium chloride infusion, , Intravenous, Continuous, Todd Sails, MD  Medications Prior to Admission  Medication Sig Dispense Refill Last Dose  . ACCU-CHEK AVIVA PLUS test strip USE 1 STRIP TO CHECK GLUCOSE 4 TIMES DAILY  1 12/29/2017 at Unknown time  . amLODipine (NORVASC) 10 MG tablet Take 10 mg by mouth daily. AM   12/28/2017 at Unknown time  . aspirin 325 MG tablet Take 325 mg by mouth daily. AM   Past Week at Unknown time  . benazepril (LOTENSIN) 40 MG tablet    12/28/2017 at Unknown time  . budesonide-formoterol (SYMBICORT) 80-4.5 MCG/ACT inhaler Inhale 2 puffs into the lungs.    12/28/2017 at Unknown time  . citalopram (CELEXA) 10 MG tablet Take 10 mg by mouth daily.    12/28/2017 at Unknown time  . docusate sodium (COLACE) 100 MG capsule Take 100 mg by mouth daily.   12/28/2017 at Unknown time  . fexofenadine (ALLEGRA) 180 MG tablet Take by mouth.   12/28/2017 at Unknown time  . finasteride (PROSCAR) 5 MG tablet Take 1 tablet (5 mg total) by mouth daily. 90 tablet 3 12/28/2017 at Unknown time  . gabapentin (NEURONTIN) 300 MG capsule Take 300 mg by mouth 2 (two) times daily.   12/28/2017 at Unknown time  . guaiFENesin (MUCINEX)  600 MG 12 hr tablet Take 600 mg by mouth 2 (two) times daily.   12/28/2017 at Unknown time  . hydrALAZINE (APRESOLINE) 25 MG tablet hydralazine 25 mg tablet   12/28/2017 at Unknown time  . insulin aspart protamine- aspart (NOVOLOG MIX 70/30) (70-30) 100 UNIT/ML injection Inject 12-28 Units into the skin 2 (two) times daily with a meal. 12 units before breakfast and 28 units before dinner   12/28/2017 at Unknown time  . insulin NPH-regular Human (NOVOLIN 70/30) (70-30) 100 UNIT/ML injection Inject 5 Units into the skin daily with breakfast.   12/28/2017 at Unknown time  . insulin NPH-regular Human (NOVOLIN 70/30) (70-30) 100 UNIT/ML injection Inject 10 Units into the skin daily with supper.   12/28/2017 at Unknown time  . magnesium 30 MG tablet Take 30 mg by mouth 2 (two) times daily.   12/28/2017 at Unknown time  . magnesium oxide (MAG-OX) 400 MG tablet Take 400 mg by mouth daily.   12/28/2017 at Unknown time  . Melatonin 10 MG TABS Take by mouth.   12/28/2017 at Unknown time  . mupirocin ointment (BACTROBAN) 2 % Place 1 application into the nose 2 (two) times daily.   12/28/2017 at Unknown time  . naproxen sodium (ALEVE) 220 MG tablet Take 220 mg by mouth.   Past Week at Unknown time  . omeprazole (PRILOSEC) 40 MG capsule Take  40 mg by mouth daily. PM   12/28/2017 at Unknown time  . rosuvastatin (CRESTOR) 40 MG tablet Take 40 mg by mouth daily.    12/28/2017 at Unknown time  . saccharomyces boulardii (FLORASTOR) 250 MG capsule Take 250 mg by mouth 2 (two) times daily.   12/28/2017 at Unknown time  . tamsulosin (FLOMAX) 0.4 MG CAPS capsule Take 1 capsule (0.4 mg total) by mouth daily after breakfast. 90 capsule 3 12/28/2017 at Unknown time  . traMADol (ULTRAM) 50 MG tablet Take 50 mg by mouth every 6 (six) hours as needed.   Past Week at Unknown time  . furosemide (LASIX) 20 MG tablet Take 20 mg by mouth.   Completed Course at Unknown time  . ipratropium-albuterol (DUONEB) 0.5-2.5 (3) MG/3ML SOLN  Take 3 mLs by nebulization every 6 (six) hours as needed. 360 mL 4 Taking  . nitroGLYCERIN (NITROSTAT) 0.4 MG SL tablet Place under the tongue.    at prn  . polyethylene glycol (MIRALAX / GLYCOLAX) packet Take 17 g by mouth daily.   Completed Course at Unknown time     Allergies  Allergen Reactions  . Macrolides And Ketolides Swelling    "-mycin" drugs "-mycin" drugs "-mycin" drugs   . Erythromycin     Other reaction(s): Unknown  . Propranolol   . Sulfa Antibiotics Swelling and Other (See Comments)    Other reaction(s): Other (See Comments)  . Sulfasalazine Swelling  . Tapentadol Other (See Comments)    Heavy tape causes raw skin  . Telbivudine Swelling    "mycin" drugs   . Tape Other (See Comments) and Rash    Heavy tape causes raw skin     Past Medical History:  Diagnosis Date  . Arthritis    neck, shoulders, hips, knees  . Asthma   . Barrett esophagus   . BPH (benign prostatic hyperplasia)   . Coronary artery disease    annual stress with Dr. Humphrey Rolls. no new findings  . Depression   . Diabetes mellitus without complication ([place])   . Diverticulosis   . Duodenitis   . Gastritis   . GERD (gastroesophageal reflux disease)   . Hypercholesteremia   . Hypertension   . Kidney stone   . Neuromuscular disorder (HCC)    neuropathy - feet  . Occasional tremors    hands  . Renal insufficiency   . Sleep apnea    doesn't wear CPAP  . Wears dentures    full upper    Review of systems:      Physical Exam    Heart and lungs: Good rate and rhythm without rub or gallop, lungs are bilaterally clear.    HEENT: Normocephalic atraumatic eyes are anicteric    Other:    Pertinant exam for procedure: Soft nontender nondistended bowel sounds positive normoactive    Planned proceedures: EGD and indicated procedures. I have discussed the risks benefits and complications of procedures to include not limited to bleeding, infection, perforation and the risk of sedation and the  patient wishes to proceed.    Todd Sails, MD Gastroenterology 12/29/2017  7:20 AM

## 2017-12-29 NOTE — Anesthesia Post-op Follow-up Note (Signed)
Anesthesia QCDR form completed.        

## 2017-12-29 NOTE — Transfer of Care (Signed)
Immediate Anesthesia Transfer of Care Note  Patient: Todd Davidson  Procedure(s) Performed: ESOPHAGOGASTRODUODENOSCOPY (EGD) (N/A )  Patient Location: PACU  Anesthesia Type:General  Level of Consciousness: awake and sedated  Airway & Oxygen Therapy: Patient Spontanous Breathing and Patient connected to nasal cannula oxygen  Post-op Assessment: Report given to RN and Post -op Vital signs reviewed and stable  Post vital signs: Reviewed and stable  Last Vitals:  Vitals Value Taken Time  BP    Temp    Pulse    Resp    SpO2      Last Pain:  Vitals:   12/29/17 0709  TempSrc: Tympanic  PainSc: 0-No pain         Complications: No apparent anesthesia complications

## 2017-12-29 NOTE — Op Note (Signed)
[facility] Gastroenterology Patient Name: Todd Davidson Procedure Date: 12/29/2017 7:30 AM MRN: [id number] Account #: [id number] Date of Birth: 1935-05-04 Admit Type: Outpatient Age: 82 Room: [facility] ENDO ROOM 3 Gender: Male Note Status: Finalized Procedure:            Upper GI endoscopy Indications:          Surveillance procedure, Follow-up of Barrett's esophagus Providers:            [name], MD Referring MD:         Perrin Maltese, MD (Referring MD) Medicines:            Monitored Anesthesia Care Complications:        No immediate complications. Procedure:            Pre-Anesthesia Assessment:                       - ASA Grade Assessment: III - A patient with severe                        systemic disease.                       After obtaining informed consent, the endoscope was                        passed under direct vision. Throughout the procedure,                        the patient's blood pressure, pulse, and oxygen                        saturations were monitored continuously. The Endoscope                        was introduced through the mouth, and advanced to the                        third part of duodenum. The upper GI endoscopy was                        accomplished without difficulty. The patient tolerated                        the procedure well. Findings:      There were esophageal mucosal changes consistent with long-segment       Barrett's esophagus present in the lower third of the esophagus. The       maximum longitudinal extent of these mucosal changes was 5 cm in length.       Mucosa was biopsied with a cold forceps for histology. A total of 3       specimen bottles were sent to pathology. Mucosa was biopsied with a cold       forceps for histology in a targeted manner and in 4 quadrants at       intervals of 2 cm at 40, 42 and 44 cm from the incisors. A total of 3       specimen bottles were sent to pathology.      The  exam of the esophagus was otherwise normal.      The GE junction is patulous.  Diffuse and patchy mild mucosal variance characterized by mild papillary       appearance. was found in the gastric body. Biopsies were taken with a       cold forceps for histology.      The cardia and gastric fundus were normal on retroflexion otherwise.      The examined duodenum was normal. Impression:           - Esophageal mucosal changes consistent with                        long-segment Barrett's esophagus. Biopsied.                       - Gastric mucosal variant. Biopsied.                       - Normal examined duodenum. Recommendation:       - Discharge patient to home.                       - Await pathology results.                       - Continue present medications.                       - Telephone GI clinic for pathology results in 1 week. Procedure Code(s):    --- Professional ---                       [phone], Esophagogastroduodenoscopy, flexible, transoral;                        with biopsy, single or multiple Diagnosis Code(s):    --- Professional ---                       K22.70, Barrett's esophagus without dysplasia                       K31.89, Other diseases of stomach and duodenum CPT copyright 2018 American Medical Association. All rights reserved. The codes documented in this report are preliminary and upon coder review may  be revised to meet current compliance requirements. [name], MD 12/29/2017 8:21:14 AM This report has been signed electronically. Number of Addenda: 0 Note Initiated On: 12/29/2017 7:30 AM      [facility]

## 2017-12-29 NOTE — Anesthesia Postprocedure Evaluation (Signed)
Anesthesia Post Note  Patient: Todd Davidson  Procedure(s) Performed: ESOPHAGOGASTRODUODENOSCOPY (EGD) (N/A )  Patient location during evaluation: Endoscopy Anesthesia Type: General Level of consciousness: awake and alert Pain management: pain level controlled Vital Signs Assessment: post-procedure vital signs reviewed and stable Respiratory status: spontaneous breathing, nonlabored ventilation, respiratory function stable and patient connected to nasal cannula oxygen Cardiovascular status: blood pressure returned to baseline and stable Postop Assessment: no apparent nausea or vomiting Anesthetic complications: no     Last Vitals:  Vitals:   12/29/17 0709 12/29/17 0819  BP: 128/71 110/62  Pulse: 65   Resp: 16 16  Temp: (!) 36.1 C (!) 36.1 C  SpO2: 99% 99%    Last Pain:  Vitals:   12/29/17 0849  TempSrc:   PainSc: 0-No pain                 Martha Clan

## 2017-12-29 NOTE — Anesthesia Procedure Notes (Signed)
Performed by: Cook-Martin, Andreka Stucki Pre-anesthesia Checklist: Patient identified, Emergency Drugs available, Suction available, Patient being monitored and Timeout performed Patient Re-evaluated:Patient Re-evaluated prior to induction Oxygen Delivery Method: Nasal cannula Preoxygenation: Pre-oxygenation with 100% oxygen Induction Type: IV induction Airway Equipment and Method: Bite block Placement Confirmation: positive ETCO2 and CO2 detector       

## 2017-12-29 NOTE — Anesthesia Preprocedure Evaluation (Signed)
Anesthesia Evaluation  Patient identified by MRN, date of birth, ID band Patient awake    Reviewed: Allergy & Precautions, H&P , NPO status , Patient's Chart, lab work & pertinent test results, reviewed documented beta blocker date and time   History of Anesthesia Complications Negative for: history of anesthetic complications  Airway Mallampati: I  TM Distance: >3 FB Neck ROM: full    Dental  (+) Edentulous Upper, Upper Dentures   Pulmonary neg pulmonary ROS, neg shortness of breath, asthma , sleep apnea , neg COPD, neg recent URI, former smoker,           Cardiovascular Exercise Tolerance: Good hypertension, (-) angina+ CAD and + Cardiac Stents  (-) Past MI and (-) CABG (-) dysrhythmias + Valvular Problems/Murmurs      Neuro/Psych neg Seizures PSYCHIATRIC DISORDERS (Depression)  Neuromuscular disease    GI/Hepatic Neg liver ROS, GERD  ,  Endo/Other  diabetes, Insulin Dependent  Renal/GU CRFRenal disease (kidney stones)  negative genitourinary   Musculoskeletal   Abdominal   Peds  Hematology negative hematology ROS (+)   Anesthesia Other Findings Past Medical History: No date: Arthritis     Comment: neck, shoulders, hips, knees No date: Asthma No date: Barrett esophagus No date: BPH (benign prostatic hyperplasia) No date: Coronary artery disease     Comment: annual stress with Dr. Humphrey Rolls. no new findings No date: Depression No date: Diabetes mellitus without complication (HCC) No date: GERD (gastroesophageal reflux disease) No date: Hypercholesteremia No date: Hypertension No date: Kidney stone No date: Neuromuscular disorder ([place])     Comment: neuropathy - feet No date: Occasional tremors     Comment: hands No date: Renal insufficiency No date: Sleep apnea     Comment: doesn't wear CPAP No date: Wears dentures     Comment: full upper   Reproductive/Obstetrics negative OB ROS                              Anesthesia Physical  Anesthesia Plan  ASA: III  Anesthesia Plan: General   Post-op Pain Management:    Induction: Intravenous  PONV Risk Score and Plan: 2 and Propofol infusion and TIVA  Airway Management Planned: Nasal Cannula and Natural Airway  Additional Equipment:   Intra-op Plan:   Post-operative Plan:   Informed Consent: I have reviewed the patients History and Physical, chart, labs and discussed the procedure including the risks, benefits and alternatives for the proposed anesthesia with the patient or authorized representative who has indicated his/her understanding and acceptance.   Dental Advisory Given  Plan Discussed with: Anesthesiologist, CRNA and Surgeon  Anesthesia Plan Comments:         Anesthesia Quick Evaluation

## 2017-12-30 DIAGNOSIS — R634 Abnormal weight loss: Secondary | ICD-10-CM | POA: Insufficient documentation

## 2017-12-31 NOTE — Progress Notes (Signed)
[facility] day:  01/01/18  Chief Complaint: Todd Davidson is a 82 y.o. adult with thrombocytopenia who is seen for a 1 week assessment for review of workup and discussion regarding direction of therapy.  HPI:  The patient was last seen in the hematology clinic, for an initial consultation, on 12/25/2017. At that time, patient noted a 55 pound weight loss in the preceding 4-5 year period. Recently had gained 5 pounds over the last 2 months. Baseline weight 195 pounds (140 pounds in clinic). Appetite was described as poor. He complained of constipation. Exam was unremarkable.  Workup on 12/25/2017 as follows: WBC 6600 ([place] 4200).  Hemoglobin 14.1, hematocrit 40.3, MCV 88.1, and platelets 124,000. Citrated platelet count was 107,000.  ANA was negative.  Vitamin B12 level normal at 790 pg/mL.  Folate level normal at 10.2 ng/mL.  TSH normal at 2.790 uIU/mL.  Hepatitis serologies were negative.  HIV Ab non-reactive.  INR was 0.92.  PTT normal at 28.  Patient seen on 12/29/2017 for EGD by Dr. Gustavo Lah. Findings revealed esophageal mucosal changes c/w long segment of Barrett's esophagus.  There was gastric mucosal variant.  Pathology is pending.  In the interim, he states "I'm here".  He states "I eat all I want to eat".  Weight is stable.   Past Medical History:  Diagnosis Date  . Arthritis    neck, shoulders, hips, knees  . Asthma   . Barrett esophagus   . BPH (benign prostatic hyperplasia)   . Coronary artery disease    annual stress with Dr. Humphrey Rolls. no new findings  . Depression   . Diabetes mellitus without complication ([place])   . Diverticulosis   . Duodenitis   . Gastritis   . GERD (gastroesophageal reflux disease)   . Hypercholesteremia   . Hypertension   . Kidney stone   . Neuromuscular disorder (HCC)    neuropathy - feet  . Occasional tremors    hands  . Renal insufficiency   . Sleep apnea    doesn't wear CPAP  . Wears dentures     full upper    Past Surgical History:  Procedure Laterality Date  . APPENDECTOMY    . CARDIAC CATHETERIZATION  1999, 2000   stents placed  . CATARACT EXTRACTION W/PHACO Left 10/29/2014   Procedure: CATARACT EXTRACTION PHACO AND INTRAOCULAR LENS PLACEMENT (IOC);  Surgeon: Leandrew Koyanagi, MD;  Location: [place];  Service: Ophthalmology;  Laterality: Left;  DIABETIC - insulin MALYUGIN  . CERVICAL FUSION    . ESOPHAGOGASTRODUODENOSCOPY N/A 12/29/2017   Procedure: ESOPHAGOGASTRODUODENOSCOPY (EGD);  Surgeon: [name], MD;  Location: [facility] ENDOSCOPY;  Service: Endoscopy;  Laterality: N/A;  . ESOPHAGOGASTRODUODENOSCOPY (EGD) WITH PROPOFOL N/A 12/17/2015   Procedure: ESOPHAGOGASTRODUODENOSCOPY (EGD) WITH PROPOFOL;  Surgeon: [name], MD;  Location: [facility] ENDOSCOPY;  Service: Endoscopy;  Laterality: N/A;  . EYE SURGERY    . HERNIA REPAIR    . JOINT REPLACEMENT     knee  . KNEE SURGERY Right    joint removal with fusion and graft  . NISSEN FUNDOPLICATION      Family History  Problem Relation Age of Onset  . Prostate cancer Father   . Heart disease Brother   . Stroke Mother   . Breast cancer Sister     Social History:  reports that he quit smoking about 44 years ago. His smoking use included cigarettes, pipe, and cigars. He has a 20.00 pack-year smoking history. His  smokeless tobacco use includes chew. He reports that he drank alcohol. He reports that he does not use drugs. He occasionally chews tobacco.  He denies any exposure to radiation or toxins.  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  He lives in [place]. The patient is alone today.  Allergies:  Allergies  Allergen Reactions  . Macrolides And Ketolides Swelling    "-mycin" drugs "-mycin" drugs "-mycin" drugs   . Erythromycin     Other reaction(s): Unknown  . Propranolol   . Sulfa Antibiotics Swelling and Other (See Comments)    Other  reaction(s): Other (See Comments)  . Sulfasalazine Swelling  . Tapentadol Other (See Comments)    Heavy tape causes raw skin  . Telbivudine Swelling    "mycin" drugs   . Tape Other (See Comments) and Rash    Heavy tape causes raw skin    Current Medications: Current Outpatient Medications  Medication Sig Dispense Refill  . ACCU-CHEK AVIVA PLUS test strip USE 1 STRIP TO CHECK GLUCOSE 4 TIMES DAILY  1  . amLODipine (NORVASC) 10 MG tablet Take 10 mg by mouth daily. AM    . aspirin 325 MG tablet Take 325 mg by mouth daily. AM    . benazepril (LOTENSIN) 40 MG tablet     . budesonide-formoterol (SYMBICORT) 80-4.5 MCG/ACT inhaler Inhale 2 puffs into the lungs.     . citalopram (CELEXA) 10 MG tablet Take 10 mg by mouth daily.     Marland Kitchen docusate sodium (COLACE) 100 MG capsule Take 100 mg by mouth daily.    . fexofenadine (ALLEGRA) 180 MG tablet Take by mouth.    . finasteride (PROSCAR) 5 MG tablet Take 1 tablet (5 mg total) by mouth daily. 90 tablet 3  . furosemide (LASIX) 20 MG tablet Take 20 mg by mouth.    . gabapentin (NEURONTIN) 300 MG capsule Take 300 mg by mouth 2 (two) times daily.    . hydrALAZINE (APRESOLINE) 25 MG tablet hydralazine 25 mg tablet    . insulin aspart protamine- aspart (NOVOLOG MIX 70/30) (70-30) 100 UNIT/ML injection Inject 12-28 Units into the skin 2 (two) times daily with a meal. 12 units before breakfast and 28 units before dinner    . insulin NPH-regular Human (NOVOLIN 70/30) (70-30) 100 UNIT/ML injection Inject 5 Units into the skin daily with breakfast.    . insulin NPH-regular Human (NOVOLIN 70/30) (70-30) 100 UNIT/ML injection Inject 10 Units into the skin daily with supper.    Marland Kitchen ipratropium-albuterol (DUONEB) 0.5-2.5 (3) MG/3ML SOLN Take 3 mLs by nebulization every 6 (six) hours as needed. 360 mL 4  . magnesium 30 MG tablet Take 30 mg by mouth 2 (two) times daily.    . magnesium oxide (MAG-OX) 400 MG tablet Take 400 mg by mouth daily.    . Melatonin 10 MG TABS  Take by mouth.    Marland Kitchen omeprazole (PRILOSEC) 40 MG capsule Take 40 mg by mouth daily. PM    . rosuvastatin (CRESTOR) 40 MG tablet Take 40 mg by mouth daily.     Marland Kitchen saccharomyces boulardii (FLORASTOR) 250 MG capsule Take 250 mg by mouth 2 (two) times daily.    . tamsulosin (FLOMAX) 0.4 MG CAPS capsule Take 1 capsule (0.4 mg total) by mouth daily after breakfast. 90 capsule 3  . guaiFENesin (MUCINEX) 600 MG 12 hr tablet Take 600 mg by mouth 2 (two) times daily.    . mupirocin ointment (  BACTROBAN) 2 % Place 1 application into the nose 2 (two) times daily.    . naproxen sodium (ALEVE) 220 MG tablet Take 220 mg by mouth.    . nitroGLYCERIN (NITROSTAT) 0.4 MG SL tablet Place under the tongue.    . traMADol (ULTRAM) 50 MG tablet Take 50 mg by mouth every 6 (six) hours as needed.     No current facility-administered medications for this visit.     Review of Systems  Constitutional: Negative for diaphoresis, fever, malaise/fatigue and weight loss (55 pound loss in 4-5 years; weight stable since last visit).       "I'm here".  HENT: Negative.  Negative for congestion, ear discharge, ear pain, nosebleeds, sinus pain and sore throat.   Eyes: Negative.  Negative for blurred vision, double vision, photophobia, pain and discharge.  Respiratory: Negative.  Negative for cough, hemoptysis, sputum production and shortness of breath.   Cardiovascular: Negative.  Negative for chest pain, palpitations, orthopnea, leg swelling and PND.  Gastrointestinal: Positive for constipation. Negative for abdominal pain, blood in stool, diarrhea, melena and nausea.  Genitourinary: Negative.  Negative for dysuria, frequency, hematuria and urgency.  Musculoskeletal: Positive for joint pain (LEFT shoulder) and neck pain. Negative for back pain, falls and myalgias.       RIGHT knee fused.  Skin: Negative for itching and rash.       Fragile skin.  Neurological: Negative for dizziness, tremors, sensory change, speech change, focal  weakness, weakness and headaches.  Endo/Heme/Allergies: Does not bruise/bleed easily.       Diabetes.  Psychiatric/Behavioral: Negative for depression and memory loss. The patient is not nervous/anxious and does not have insomnia.   All other systems reviewed and are negative.  Performance status (ECOG): 1 - Symptomatic but completely ambulatory  Vital Signs BP (!) 135/51 (BP Location: Left Arm, Patient Position: Sitting)   Pulse 71   Temp (!) 95.9 F (35.5 C) (Tympanic)   Resp 18   Wt 145 lb (65.8 kg)   BMI 21.41 kg/m   Physical Exam  Constitutional: He is oriented to person, place, and time and well-developed, well-nourished, and in no distress. No distress.  HENT:  Head: Atraumatic.  Mouth/Throat: Mucous membranes are normal.  Dark blonde hair.  Eyes: Conjunctivae and EOM are normal. No scleral icterus.  Blue eyes.  Musculoskeletal: Normal range of motion. He exhibits deformity (RIGHT lower extremity (knee) fusion). He exhibits no edema or tenderness.  Neurological: He is alert and oriented to person, place, and time.  Skin: He is not diaphoretic.  Psychiatric: Mood, affect and judgment normal.  Nursing note and vitals reviewed.   No visits with results within 3 Day(s) from this visit.  Latest known visit with results is:  Admission on 12/29/2017, Discharged on 12/29/2017  Component Date Value Ref Range Status  . Glucose-Capillary 12/29/2017 73  70 - 99 mg/dL Final  . SURGICAL PATHOLOGY 12/29/2017    Final                   Value:Surgical Pathology CASE: [id number] PATIENT: Todd Davidson Surgical Pathology Report     SPECIMEN SUBMITTED: A. Stomach, body, papillary mucosa; cbx B. Esophagus, 44 cm from the incisors; cbx C. Esophagus, 42 cm from the incisors; cbx D. Esophagus, 40 cm from the incisors; cbx  CLINICAL HISTORY: None provided  PRE-OPERATIVE DIAGNOSIS: HX of Barrett's esophagus  POST-OPERATIVE DIAGNOSIS: Barrett's esophagus and  gastritis     DIAGNOSIS: A.  STOMACH, BODY, PAPILLARY MUCOSA; COLD BIOPSY: -  HYPERPLASTIC OXYNTIC MUCOSA WITH FEATURES CONSISTENT WITH PROTON PUMP INHIBITOR EFFECT. - NEGATIVE FOR INTESTINAL METAPLASIA, DYSPLASIA, AND MALIGNANCY.  B.  ESOPHAGUS, 44 CM FROM THE INCISORS; COLD BIOPSY: - OXYNTIC-TYPE MUCOSA WITH MILD CHRONIC INFLAMMATION. - NEGATIVE FOR GOBLET CELLS, DYSPLASIA, AND MALIGNANCY.  C.  ESOPHAGUS, 42 CM FROM THE INCISORS; COLD BIOPSY: - BARRETT'S ESOPHAGUS. - NEGATIVE FOR DYSPLASIA AND MALIGNANCY.  D.  ESOPHAGUS, 40 CM FROM THE INC                         ISORS; COLD BIOPSY: - BARRETT'S ESOPHAGUS. - NEGATIVE FOR DYSPLASIA AND MALIGNANCY.   GROSS DESCRIPTION: A. Labeled: Cbx gastric body capillary mucosa Received: In formalin Tissue fragment(s): 2 Size: 0.3 and 0.5 cm Description: Pink-tan fragments Entirely submitted in one cassette.  B. Labeled: Cbx esophageal 44 cm from incisors Received: In formalin Tissue fragment(s): 1 Size: 0.4 cm Description: Pink-tan fragment Entirely submitted in one cassette.  C. Labeled: Cbx esophageal 42 cm from incisor Received: In formalin Tissue fragment(s): 3 Size: 0.2-0.4 cm Description: Pink-tan fragments Entirely submitted in one cassette.  D. Labeled: Cbx esophageal 40 cm from incisor Received: In formalin Tissue fragment(s): 3 Size: 0.2-0.5 cm Description: Pink-tan fragments Entirely submitted in one cassette.   Final Diagnosis performed by Bryan Lemma, MD.   Electronically signed 01/02/2018 5:54:10PM The electronic signature indicates that the named Attending Pat                         hologist has evaluated the specimen  Technical component performed at [facility], [address], [place], [place] [address] Lab: [phone] Dir: Rush Farmer, MD, MMM  Professional component performed at [facility], [facility], [place], [place], [place] [address] Lab: [phone] Dir: Dellia Nims. Rubinas, MD   . Glucose-Capillary 12/29/2017 77  70 - 99 mg/dL Final  . Comment 1 12/29/2017 Notify RN   Final    Assessment:  Todd Davidson is a 82 y.o. adult with mild thrombocytopenia dating back to 06/16/2012.  Platelet count has ranged between 114,000 - 141,000 without trend.  He denies any history of hepatitis.  He denies any new medications or herbal products.  None of his current medications are implicated.   He has a history of an elevated PSA.  PSA was 13 on 05/31/2016 and 6.6 on 09/05/2017.  EGD on 12/17/2015 revealed Barrett's esophagus without dysplasia.  Colonoscopy in 2014 revealed diverticulosis.  Workup on 12/25/2017: WBC 6600 ([place] 4200).  Hemoglobin 14.1, hematocrit 40.3, MCV 88.1, and platelets 124,000. Citrated platelet count was 107,000.  Normal studies included:  ANA, vitamin B12, folate, TSH, hepatitis B and C serologies, HIV antibody, PT and PTT.  Symptomatically, he denies any new complaints.  He denies any bruising or bleeding.  He has a poor appetite.  Exam is unremarkable.    Plan: 1. Review labs from 12/25/2017 2. Thrombocytopenia  Platelet count 124,000.  No pseudothrombocytopenia - citrated platelet count, which was found to be 107,000.  Negative for viral etiology - hepatitis and HIV serologies negative.  No concern for autoimmune induced - ANA with reflex negative.   GI blood loss not of concern - CBC and coagulation studies normal.  Patient denies new medications or herbal products.  Review that platelet count has been mildly low for at least the last 5 years.  3. Weight loss  TSH normal.  Vitamin levels normal.   Etiology of > 50 pound  weight loss over last 4-5 years unclear.   Discuss further evaluation if weight loss persists.  4. RTC in 6 months for MD assessment and labs (CBC with diff, CMP, LDH, uric acid).   Honor Loh, NP 01/01/18, 4:35 PM   I saw and evaluated the patient, participating in the key portions of the service  and reviewing pertinent diagnostic studies and records.  I reviewed the nurse practitioner's note and agree with the findings and the plan.  The assessment and plan were discussed with the patient.  Multiple questions were asked by the patient and answered.   Nolon Stalls, MD 01/01/18, 4:35 PM

## 2018-01-01 ENCOUNTER — Inpatient Hospital Stay (HOSPITAL_BASED_OUTPATIENT_CLINIC_OR_DEPARTMENT_OTHER): Payer: Medicare Other | Admitting: Hematology and Oncology

## 2018-01-01 ENCOUNTER — Encounter: Payer: Self-pay | Admitting: Hematology and Oncology

## 2018-01-01 ENCOUNTER — Other Ambulatory Visit: Payer: Self-pay

## 2018-01-01 VITALS — BP 135/51 | HR 71 | Temp 95.9°F | Resp 18 | Wt 145.0 lb

## 2018-01-01 DIAGNOSIS — E119 Type 2 diabetes mellitus without complications: Secondary | ICD-10-CM | POA: Diagnosis not present

## 2018-01-01 DIAGNOSIS — D696 Thrombocytopenia, unspecified: Secondary | ICD-10-CM | POA: Diagnosis not present

## 2018-01-01 DIAGNOSIS — I10 Essential (primary) hypertension: Secondary | ICD-10-CM

## 2018-01-01 DIAGNOSIS — R634 Abnormal weight loss: Secondary | ICD-10-CM | POA: Diagnosis not present

## 2018-01-01 DIAGNOSIS — Z87891 Personal history of nicotine dependence: Secondary | ICD-10-CM

## 2018-01-01 NOTE — Progress Notes (Signed)
Here for follow up . Stated overall feels" fair " stated he had esophagus biopsy last week and will get results this Fri pt stated.

## 2018-01-02 LAB — SURGICAL PATHOLOGY

## 2018-03-02 ENCOUNTER — Other Ambulatory Visit: Payer: Medicare Other

## 2018-03-02 ENCOUNTER — Other Ambulatory Visit: Payer: Self-pay | Admitting: Family Medicine

## 2018-03-02 DIAGNOSIS — R972 Elevated prostate specific antigen [PSA]: Secondary | ICD-10-CM

## 2018-03-03 LAB — PSA: Prostate Specific Ag, Serum: 6.8 ng/mL — ABNORMAL HIGH (ref 0.0–4.0)

## 2018-03-07 ENCOUNTER — Encounter: Payer: Self-pay | Admitting: Urology

## 2018-03-07 ENCOUNTER — Ambulatory Visit (INDEPENDENT_AMBULATORY_CARE_PROVIDER_SITE_OTHER): Payer: Medicare Other | Admitting: Urology

## 2018-03-07 VITALS — BP 145/70 | HR 76 | Ht 71.0 in | Wt 145.4 lb

## 2018-03-07 DIAGNOSIS — N401 Enlarged prostate with lower urinary tract symptoms: Secondary | ICD-10-CM

## 2018-03-07 DIAGNOSIS — R350 Frequency of micturition: Secondary | ICD-10-CM | POA: Diagnosis not present

## 2018-03-07 DIAGNOSIS — R972 Elevated prostate specific antigen [PSA]: Secondary | ICD-10-CM | POA: Diagnosis not present

## 2018-03-07 NOTE — Progress Notes (Signed)
03/07/2018 8:48 AM   Thomasenia Bottoms Kise 03/07/1936 [id number]  Referring provider: Perrin Maltese, MD [address] [place], [place] [address]  Chief Complaint  Patient presents with  . Elevated PSA   Urologic history: 1.  Elevated PSA  -Elevated PSA dating back to 2014 which has slowly risen  -Previously followed by [name].  He declined prostate biopsy  -Started finasteride September 2018 when PSA rose to 13  2.  BPH with lower urinary tract symptoms  -On tamsulosin/finasteride  3.  History nephrolithiasis  HPI: 82 year old male presents for six-month follow-up. -Stable lower urinary tract symptoms on tamsulosin/finasteride -Denies dysuria, gross hematuria or flank/abdominal/pelvic/scrotal pain -Repeat PSA December 2019 stable at 6.8   PMH: Past Medical History:  Diagnosis Date  . Arthritis    neck, shoulders, hips, knees  . Asthma   . Barrett esophagus   . BPH (benign prostatic hyperplasia)   . Coronary artery disease    annual stress with Dr. Humphrey Rolls. no new findings  . Depression   . Diabetes mellitus without complication ([place])   . Diverticulosis   . Duodenitis   . Gastritis   . GERD (gastroesophageal reflux disease)   . Hypercholesteremia   . Hypertension   . Kidney stone   . Neuromuscular disorder (HCC)    neuropathy - feet  . Occasional tremors    hands  . Renal insufficiency   . Sleep apnea    doesn't wear CPAP  . Wears dentures    full upper    Surgical History: Past Surgical History:  Procedure Laterality Date  . APPENDECTOMY    . CARDIAC CATHETERIZATION  1999, 2000   stents placed  . CATARACT EXTRACTION W/PHACO Left 10/29/2014   Procedure: CATARACT EXTRACTION PHACO AND INTRAOCULAR LENS PLACEMENT (IOC);  Surgeon: Leandrew Koyanagi, MD;  Location: [facility];  Service: Ophthalmology;  Laterality: Left;  DIABETIC - insulin MALYUGIN  . CERVICAL FUSION    . ESOPHAGOGASTRODUODENOSCOPY N/A 12/29/2017   Procedure:  ESOPHAGOGASTRODUODENOSCOPY (EGD);  Surgeon: [name], MD;  [facility] ENDOSCOPY;  Service: Endoscopy;  Laterality: N/A;  . ESOPHAGOGASTRODUODENOSCOPY (EGD) WITH PROPOFOL N/A 12/17/2015   Procedure: ESOPHAGOGASTRODUODENOSCOPY (EGD) WITH PROPOFOL;  Surgeon: [name], MD;  Location: [facility] ENDOSCOPY;  Service: Endoscopy;  Laterality: N/A;  . EYE SURGERY    . HERNIA REPAIR    . JOINT REPLACEMENT     knee  . KNEE SURGERY Right    joint removal with fusion and graft  . NISSEN FUNDOPLICATION      Home Medications:  Allergies as of 03/07/2018      Reactions   Macrolides And Ketolides Swelling   "-mycin" drugs "-mycin" drugs "-mycin" drugs   Erythromycin    Other reaction(s): Unknown   Propranolol    Sulfa Antibiotics Swelling, Other (See Comments)   Other reaction(s): Other (See Comments)   Sulfasalazine Swelling   Tapentadol Other (See Comments)   Heavy tape causes raw skin   Telbivudine Swelling   "mycin" drugs   Tape Other (See Comments), Rash   Heavy tape causes raw skin      Medication List       Accurate as of March 07, 2018  8:48 AM. Always use your most recent med list.        ACCU-CHEK AVIVA PLUS test strip Generic drug:  glucose blood USE 1 STRIP TO CHECK GLUCOSE 4 TIMES DAILY   amLODipine 10 MG tablet Commonly known as:  NORVASC Take 10 mg by mouth daily.  AM   aspirin 325 MG tablet Take 325 mg by mouth daily. AM   benazepril 40 MG tablet Commonly known as:  LOTENSIN   citalopram 10 MG tablet Commonly known as:  CELEXA Take 10 mg by mouth daily.   docusate sodium 100 MG capsule Commonly known as:  COLACE Take 100 mg by mouth daily.   fexofenadine 180 MG tablet Commonly known as:  ALLEGRA Take by mouth.   finasteride 5 MG tablet Commonly known as:  PROSCAR Take 1 tablet (5 mg total) by mouth daily.   furosemide 20 MG tablet Commonly known as:  LASIX Take 20 mg by mouth.   gabapentin 300 MG capsule Commonly known as:   NEURONTIN Take 300 mg by mouth 2 (two) times daily.   hydrALAZINE 25 MG tablet Commonly known as:  APRESOLINE hydralazine 25 mg tablet   insulin aspart protamine- aspart (70-30) 100 UNIT/ML injection Commonly known as:  NOVOLOG MIX 70/30 Inject 12-28 Units into the skin 2 (two) times daily with a meal. 12 units before breakfast and 28 units before dinner   insulin NPH-regular Human (70-30) 100 UNIT/ML injection Inject 5 Units into the skin daily with breakfast.   insulin NPH-regular Human (70-30) 100 UNIT/ML injection Inject 10 Units into the skin daily with supper.   ipratropium-albuterol 0.5-2.5 (3) MG/3ML Soln Commonly known as:  DUONEB Take 3 mLs by nebulization every 6 (six) hours as needed.   magnesium 30 MG tablet Take 30 mg by mouth 2 (two) times daily.   magnesium oxide 400 MG tablet Commonly known as:  MAG-OX Take 400 mg by mouth daily.   Melatonin 10 MG Tabs Take by mouth.   MUCINEX 600 MG 12 hr tablet Generic drug:  guaiFENesin Take 600 mg by mouth 2 (two) times daily.   mupirocin ointment 2 % Commonly known as:  BACTROBAN Place 1 application into the nose 2 (two) times daily.   naproxen sodium 220 MG tablet Commonly known as:  ALEVE Take 220 mg by mouth.   nitroGLYCERIN 0.4 MG SL tablet Commonly known as:  NITROSTAT Place under the tongue.   omeprazole 40 MG capsule Commonly known as:  PRILOSEC Take 40 mg by mouth daily. PM   rosuvastatin 40 MG tablet Commonly known as:  CRESTOR Take 40 mg by mouth daily.   saccharomyces boulardii 250 MG capsule Commonly known as:  FLORASTOR Take 250 mg by mouth 2 (two) times daily.   SYMBICORT 80-4.5 MCG/ACT inhaler Generic drug:  budesonide-formoterol Inhale 2 puffs into the lungs.   tamsulosin 0.4 MG Caps capsule Commonly known as:  FLOMAX Take 1 capsule (0.4 mg total) by mouth daily after breakfast.   traMADol 50 MG tablet Commonly known as:  ULTRAM Take 50 mg by mouth every 6 (six) hours as  needed.       Allergies:  Allergies  Allergen Reactions  . Macrolides And Ketolides Swelling    "-mycin" drugs "-mycin" drugs "-mycin" drugs   . Erythromycin     Other reaction(s): Unknown  . Propranolol   . Sulfa Antibiotics Swelling and Other (See Comments)    Other reaction(s): Other (See Comments)  . Sulfasalazine Swelling  . Tapentadol Other (See Comments)    Heavy tape causes raw skin  . Telbivudine Swelling    "mycin" drugs   . Tape Other (See Comments) and Rash    Heavy tape causes raw skin    Family History: Family History  Problem Relation Age of Onset  . Prostate cancer Father   .  Heart disease Brother   . Stroke Mother   . Breast cancer Sister     Social History:  reports that she quit smoking about 44 years ago. Her smoking use included cigarettes, pipe, and cigars. She has a 20.00 pack-year smoking history. Her smokeless tobacco use includes chew. She reports previous alcohol use. She reports that she does not use drugs.  ROS: UROLOGY Frequent Urination?: Yes Hard to postpone urination?: No Burning/pain with urination?: No Get up at night to urinate?: Yes Leakage of urine?: No Urine stream starts and stops?: No Trouble starting stream?: Yes Do you have to strain to urinate?: No Blood in urine?: No Urinary tract infection?: No Sexually transmitted disease?: No Injury to kidneys or bladder?: No Painful intercourse?: No Weak stream?: Yes Erection problems?: No Penile pain?: No Currently pregnant?: No Vaginal bleeding?: No Last menstrual period?: n  Gastrointestinal Nausea?: No Vomiting?: No Indigestion/heartburn?: No Diarrhea?: No Constipation?: No  Constitutional Fever: No Night sweats?: No Weight loss?: No Fatigue?: No  Skin Skin rash/lesions?: No Itching?: No  Eyes Blurred vision?: No Double vision?: No  Ears/Nose/Throat Sore throat?: No Sinus problems?: No  Hematologic/Lymphatic Swollen glands?: No Easy bruising?:  No  Cardiovascular Leg swelling?: No Chest pain?: No  Respiratory Cough?: No Shortness of breath?: No  Endocrine Excessive thirst?: No  Musculoskeletal Back pain?: No Joint pain?: No  Neurological Headaches?: No Dizziness?: No  Psychologic Depression?: No Anxiety?: No  Physical Exam: BP (!) 145/70 (BP Location: Left Arm, Patient Position: Standing, Cuff Size: Normal)   Pulse 76   Ht 5\' 11"  (1.803 m)   Wt 145 lb 6.4 oz (66 kg)   BMI 20.28 kg/m   Constitutional:  Alert and oriented, No acute distress. HEENT: [place] AT, moist mucus membranes.  Trachea midline, no masses. Cardiovascular: No clubbing, cyanosis, or edema. Respiratory: Normal respiratory effort, no increased work of breathing. GI: Abdomen is soft, nontender, nondistended, no abdominal masses GU: No CVA tenderness Lymph: No cervical or inguinal lymphadenopathy. Skin: No rashes, bruises or suspicious lesions. Neurologic: Grossly intact, no focal deficits, moving all 4 extremities. Psychiatric: Normal mood and affect.   Assessment & Plan:   82 year old male with an elevated PSA and BPH with moderate lower urinary tract symptoms   -Most recent uncorrected PSA is stable at 6.8.  Potential etiologies were again discussed including BPH, inflammation and prostate cancer.  -Options of continued surveillance, prostate biopsy and MRI were again discussed  -He is interested in pursuing an MRI if insurance will cover   Abbie Sons, MD  [place] [address] [place], [place] [address] [phone]

## 2018-04-02 ENCOUNTER — Other Ambulatory Visit: Payer: 59

## 2018-04-09 ENCOUNTER — Encounter: Payer: Self-pay | Admitting: Internal Medicine

## 2018-04-09 ENCOUNTER — Ambulatory Visit (INDEPENDENT_AMBULATORY_CARE_PROVIDER_SITE_OTHER): Payer: Medicare Other | Admitting: Internal Medicine

## 2018-04-09 VITALS — BP 147/64 | HR 56 | Resp 16 | Ht 72.0 in | Wt 149.0 lb

## 2018-04-09 DIAGNOSIS — I2510 Atherosclerotic heart disease of native coronary artery without angina pectoris: Secondary | ICD-10-CM

## 2018-04-09 DIAGNOSIS — J449 Chronic obstructive pulmonary disease, unspecified: Secondary | ICD-10-CM | POA: Diagnosis not present

## 2018-04-09 DIAGNOSIS — N183 Chronic kidney disease, stage 3 unspecified: Secondary | ICD-10-CM

## 2018-04-09 DIAGNOSIS — I2583 Coronary atherosclerosis due to lipid rich plaque: Secondary | ICD-10-CM

## 2018-04-09 DIAGNOSIS — R0602 Shortness of breath: Secondary | ICD-10-CM

## 2018-04-09 DIAGNOSIS — J4489 Other specified chronic obstructive pulmonary disease: Secondary | ICD-10-CM

## 2018-04-09 NOTE — Patient Instructions (Signed)

## 2018-04-09 NOTE — Progress Notes (Signed)
[facility] [place], [place] [address]  Pulmonary Sleep Medicine   Office Visit Note  Patient Name: Todd Davidson DOB: 07-10-1935 MRN [id number]  Date of Service: 04/09/2018  Complaints/HPI: COPD using nebs doing well.  Overall he states he is doing fine he has not had any admissions to the hospital.  He has noticed that he is more weak in his lower extremities.  He has a fused right knee joint so therefore he is not able to walk much.  He has significant atrophy of the right leg.  I spoke to him about doing physical therapy he was not interested.  Also spoke to him about doing therapy at home and explained to him the simple exercises that he can do and he states that he would try to do that still experiences some shortness of breath when he exerts himself and still has a bit of a cough.  He notes that the symptoms are more seasonal which would be consistent with his asthma and COPD  ROS  General: (-) fever, (-) chills, (-) night sweats, (-) weakness Skin: (-) rashes, (-) itching,. Eyes: (-) visual changes, (-) redness, (-) itching. Nose and Sinuses: (-) nasal stuffiness or itchiness, (-) postnasal drip, (-) nosebleeds, (-) sinus trouble. Mouth and Throat: (-) sore throat, (-) hoarseness. Neck: (-) swollen glands, (-) enlarged thyroid, (-) neck pain. Respiratory: + cough, (-) bloody sputum, + shortness of breath, - wheezing. Cardiovascular: - ankle swelling, (-) chest pain. Lymphatic: (-) lymph node enlargement. Neurologic: (-) numbness, (-) tingling. Psychiatric: (-) anxiety, (-) depression   Current Medication: Outpatient Encounter Medications as of 04/09/2018  Medication Sig Note  . ACCU-CHEK AVIVA PLUS test strip USE 1 STRIP TO CHECK GLUCOSE 4 TIMES DAILY   . amLODipine (NORVASC) 10 MG tablet Take 10 mg by mouth daily. AM   . aspirin 325 MG tablet Take 325 mg by mouth daily. AM   . benazepril (LOTENSIN) 40 MG tablet    . budesonide-formoterol  (SYMBICORT) 80-4.5 MCG/ACT inhaler Inhale 2 puffs into the lungs.    . carbidopa-levodopa (SINEMET CR) 50-200 MG tablet Take 1 tablet by mouth 3 (three) times daily.   . fexofenadine (ALLEGRA) 180 MG tablet Take by mouth.   . finasteride (PROSCAR) 5 MG tablet Take 1 tablet (5 mg total) by mouth daily.   Marland Kitchen gabapentin (NEURONTIN) 300 MG capsule Take 300 mg by mouth 2 (two) times daily.   Marland Kitchen guaiFENesin (MUCINEX) 600 MG 12 hr tablet Take 600 mg by mouth 2 (two) times daily.   . insulin aspart protamine- aspart (NOVOLOG MIX 70/30) (70-30) 100 UNIT/ML injection Inject 12-28 Units into the skin 2 (two) times daily with a meal. 12 units before breakfast and 28 units before dinner   . insulin lispro protamine-lispro (HUMALOG 75/25 MIX) (75-25) 100 UNIT/ML SUSP injection Inject 28 Units into the skin 2 (two) times daily with a meal.   . insulin NPH-regular Human (NOVOLIN 70/30) (70-30) 100 UNIT/ML injection Inject 5 Units into the skin daily with breakfast.   . ipratropium-albuterol (DUONEB) 0.5-2.5 (3) MG/3ML SOLN Take 3 mLs by nebulization every 6 (six) hours as needed.   . magnesium oxide (MAG-OX) 400 MG tablet Take 400 mg by mouth daily.   . nitroGLYCERIN (NITROSTAT) 0.4 MG SL tablet Place under the tongue. 03/10/2016: Received from: [facility]: Place 0.4 mg under the tongue every 5 (five) minutes as needed. Reported on 04/21/2015  . omeprazole (PRILOSEC) 40 MG capsule  Take 40 mg by mouth daily. PM   . rosuvastatin (CRESTOR) 40 MG tablet Take 40 mg by mouth daily.    Marland Kitchen saccharomyces boulardii (FLORASTOR) 250 MG capsule Take 250 mg by mouth 2 (two) times daily.   . tamsulosin (FLOMAX) 0.4 MG CAPS capsule Take 1 capsule (0.4 mg total) by mouth daily after breakfast.   . citalopram (CELEXA) 10 MG tablet Take 10 mg by mouth daily.    Marland Kitchen docusate sodium (COLACE) 100 MG capsule Take 100 mg by mouth daily.   . furosemide (LASIX) 20 MG tablet Take 20 mg by mouth.   . hydrALAZINE  (APRESOLINE) 25 MG tablet hydralazine 25 mg tablet   . magnesium 30 MG tablet Take 30 mg by mouth 2 (two) times daily.   . Melatonin 10 MG TABS Take by mouth. 03/10/2016: Received from: [facility]: Take 10 mg by mouth nightly.  . mupirocin ointment (BACTROBAN) 2 % Place 1 application into the nose 2 (two) times daily.   . naproxen sodium (ALEVE) 220 MG tablet Take 220 mg by mouth.   . traMADol (ULTRAM) 50 MG tablet Take 50 mg by mouth every 6 (six) hours as needed.   . [DISCONTINUED] insulin NPH-regular Human (NOVOLIN 70/30) (70-30) 100 UNIT/ML injection Inject 10 Units into the skin daily with supper.    No facility-administered encounter medications on file as of 04/09/2018.     Surgical History: Past Surgical History:  Procedure Laterality Date  . APPENDECTOMY    . CARDIAC CATHETERIZATION  1999, 2000   stents placed  . CATARACT EXTRACTION W/PHACO Left 10/29/2014   Procedure: CATARACT EXTRACTION PHACO AND INTRAOCULAR LENS PLACEMENT (IOC);  Surgeon: Leandrew Koyanagi, MD;  Location: [facility];  Service: Ophthalmology;  Laterality: Left;  DIABETIC - insulin MALYUGIN  . CERVICAL FUSION    . ESOPHAGOGASTRODUODENOSCOPY N/A 12/29/2017   Procedure: ESOPHAGOGASTRODUODENOSCOPY (EGD);  Surgeon: [name], MD;  [facility] ENDOSCOPY;  Service: Endoscopy;  Laterality: N/A;  . ESOPHAGOGASTRODUODENOSCOPY (EGD) WITH PROPOFOL N/A 12/17/2015   Procedure: ESOPHAGOGASTRODUODENOSCOPY (EGD) WITH PROPOFOL;  Surgeon: [name], MD;  Location: [facility] ENDOSCOPY;  Service: Endoscopy;  Laterality: N/A;  . EYE SURGERY    . HERNIA REPAIR    . JOINT REPLACEMENT     knee  . KNEE SURGERY Right    joint removal with fusion and graft  . NISSEN FUNDOPLICATION      Medical History: Past Medical History:  Diagnosis Date  . Arthritis    neck, shoulders, hips, knees  . Asthma   . Barrett esophagus   . BPH (benign prostatic hyperplasia)   . Coronary  artery disease    annual stress with Dr. Humphrey Rolls. no new findings  . Depression   . Diabetes mellitus without complication ([place])   . Diverticulosis   . Duodenitis   . Gastritis   . GERD (gastroesophageal reflux disease)   . Hypercholesteremia   . Hypertension   . Kidney stone   . Neuromuscular disorder (HCC)    neuropathy - feet  . Occasional tremors    hands  . Renal insufficiency   . Sleep apnea    doesn't wear CPAP  . Wears dentures    full upper    Family History: Family History  Problem Relation Age of Onset  . Prostate cancer Father   . Heart disease Brother   . Stroke Mother   . Breast cancer Sister     Social History: Social History   Socioeconomic  History  . Marital status: Married    Spouse name: Not on file  . Number of children: Not on file  . Years of education: Not on file  . Highest education level: Not on file  Occupational History  . Not on file  Social Needs  . Financial resource strain: Not on file  . Food insecurity:    Worry: Not on file    Inability: Not on file  . Transportation needs:    Medical: Not on file    Non-medical: Not on file  Tobacco Use  . Smoking status: Former Smoker    Packs/day: 1.00    Years: 20.00    Pack years: 20.00    Types: Cigarettes, Pipe, Cigars    Last attempt to quit: 03/21/1973    Years since quitting: 45.0  . Smokeless tobacco: Current User    Types: Chew  Substance and Sexual Activity  . Alcohol use: Not Currently  . Drug use: No  . Sexual activity: Yes  Lifestyle  . Physical activity:    Days per week: Not on file    Minutes per session: Not on file  . Stress: Not on file  Relationships  . Social connections:    Talks on phone: Not on file    Gets together: Not on file    Attends religious service: Not on file    Active member of club or organization: Not on file    Attends meetings of clubs or organizations: Not on file    Relationship status: Not on file  . Intimate partner violence:     Fear of current or ex partner: Not on file    Emotionally abused: Not on file    Physically abused: Not on file    Forced sexual activity: Not on file  Other Topics Concern  . Not on file  Social History Narrative  . Not on file    Vital Signs: Blood pressure (!) 147/64, pulse (!) 56, resp. rate 16, height 6' (1.829 m), weight 149 lb (67.6 kg), SpO2 95 %.  Examination: General Appearance: The patient is well-developed, well-nourished, and in no distress. Skin: Gross inspection of skin unremarkable. Head: normocephalic, no gross deformities. Eyes: no gross deformities noted. ENT: ears appear grossly normal no exudates. Neck: Supple. No thyromegaly. No LAD. Respiratory: no rhonchi noted at this time. Cardiovascular: Normal S1 and S2 without murmur or rub. Extremities: No cyanosis. pulses are equal. Neurologic: Alert and oriented. No involuntary movements.  LABS: Recent Results (from the past 2160 hour(s))  PSA     Status: Abnormal   Collection Time: 03/02/18  9:35 AM  Result Value Ref Range   Prostate Specific Ag, Serum 6.8 (H) 0.0 - 4.0 ng/mL    Comment: Roche ECLIA methodology. According to the American Urological Association, Serum PSA should decrease and remain at undetectable levels after radical prostatectomy. The AUA defines biochemical recurrence as an initial PSA value 0.2 ng/mL or greater followed by a subsequent confirmatory PSA value 0.2 ng/mL or greater. Values obtained with different assay methods or kits cannot be used interchangeably. Results cannot be interpreted as absolute evidence of the presence or absence of malignant disease.     Radiology: No results found.  No results found.  No results found.    Assessment and Plan: Patient Active Problem List   Diagnosis Date Noted  . Weight loss 12/30/2017  . Thrombocytopenia ([place]) 12/23/2017  . Impingement syndrome of shoulder region 03/17/2016  . Type II diabetes mellitus with  neurological  manifestations ([place]) 02/02/2014  . Peripheral polyneuropathy 02/02/2014  . Microalbuminuria 02/02/2014  . Long-term insulin use ([place]) 02/02/2014  . Chronic kidney disease, stage III (moderate) ([place]) 08/15/2012  . Uric acid nephrolithiasis 08/14/2012  . Uncertain tumor of kidney and ureter 08/14/2012  . Incomplete emptying of bladder 08/14/2012  . Enlarged prostate with lower urinary tract symptoms (LUTS) 08/14/2012  . Elevated prostate specific antigen (PSA) 08/14/2012  . Acquired cyst of kidney 08/14/2012  . Benign localized hyperplasia of prostate with urinary obstruction and other lower urinary tract symptoms (LUTS)(600.21) 08/14/2012    1. COPD we will get a follow-up pulmonary function study ordered.  Continue with his inhalers as ordered.  Patient seems to be gaining benefit from his current regimen which he is tolerating fairly well. 2. SOB ordered PFT follow up  3. CAD stable no active chest pain noted.  He is followed by his primary cardiologist. 4. CKD III labs have been reviewed he states that he does not want to we will follow-up with nephrology  General Counseling: I have discussed the findings of the evaluation and examination with Jeani Hawking.  I have also discussed any further diagnostic evaluation thatmay be needed or ordered today. Ala verbalizes understanding of the findings of todays visit. We also reviewed her medications today and discussed drug interactions and side effects including but not limited excessive drowsiness and altered mental states. We also discussed that there is always a risk not just to her but also people around her. she has been encouraged to call the office with any questions or concerns that should arise related to todays visit.    Time spent: 15 minutes  I have personally obtained a history, examined the patient, evaluated laboratory and imaging results, formulated the assessment and plan and placed orders.    Allyne Gee, MD [facility] Pulmonary and  Critical Care Sleep medicine

## 2018-04-20 ENCOUNTER — Ambulatory Visit
Admission: RE | Admit: 2018-04-20 | Discharge: 2018-04-20 | Disposition: A | Discharge status: Home / Self Care | Payer: Medicare Other | Source: Ambulatory Visit | Attending: Urology | Admitting: Urology

## 2018-04-20 DIAGNOSIS — R972 Elevated prostate specific antigen [PSA]: Secondary | ICD-10-CM

## 2018-04-20 MED ORDER — GADOBENATE DIMEGLUMINE 529 MG/ML IV SOLN
14.0000 mL | Freq: Once | INTRAVENOUS | Status: AC | PRN
  Administered 2018-04-20: 14 mL via INTRAVENOUS

## 2018-04-24 ENCOUNTER — Ambulatory Visit (INDEPENDENT_AMBULATORY_CARE_PROVIDER_SITE_OTHER): Payer: Medicare Other | Admitting: Urology

## 2018-04-24 ENCOUNTER — Encounter: Payer: Self-pay | Admitting: Urology

## 2018-04-24 VITALS — BP 105/57 | HR 77 | Ht 72.0 in

## 2018-04-24 DIAGNOSIS — N401 Enlarged prostate with lower urinary tract symptoms: Secondary | ICD-10-CM | POA: Diagnosis not present

## 2018-04-24 DIAGNOSIS — R972 Elevated prostate specific antigen [PSA]: Secondary | ICD-10-CM | POA: Diagnosis not present

## 2018-04-24 DIAGNOSIS — R3911 Hesitancy of micturition: Secondary | ICD-10-CM

## 2018-04-25 ENCOUNTER — Ambulatory Visit (INDEPENDENT_AMBULATORY_CARE_PROVIDER_SITE_OTHER): Payer: Medicare Other | Admitting: Internal Medicine

## 2018-04-25 ENCOUNTER — Encounter: Payer: Self-pay | Admitting: Urology

## 2018-04-25 ENCOUNTER — Telehealth: Payer: Self-pay | Admitting: Urology

## 2018-04-25 DIAGNOSIS — R0602 Shortness of breath: Secondary | ICD-10-CM

## 2018-04-25 LAB — PULMONARY FUNCTION TEST

## 2018-04-25 MED ORDER — SILODOSIN 4 MG PO CAPS: ORAL_CAPSULE | ORAL | 0 refills | Status: DC

## 2018-04-25 NOTE — Progress Notes (Signed)
04/24/2018 7:33 AM   Todd Davidson 1935/04/24 [id number]  Referring provider: Perrin Maltese, MD [address] [place], [place] [address]  Chief Complaint  Patient presents with  . Follow-up   Urologic history: 1.  Elevated PSA             -Elevated PSA dating back to 2014 which has slowly risen             -Previously followed by [name].  He declined prostate biopsy             -Started finasteride September 2018 when PSA rose to 13  2.  BPH with lower urinary tract symptoms             -On tamsulosin/finasteride  3.  History nephrolithiasis   HPI: 83 year old male presents for prostate MRI follow-up.  His PSA in December 2019 was 6.8 (uncorrected).  After discussing options including prostate biopsy he elected to pursue an MRI if covered by his insurance.  MRI performed on 04/20/2018 was remarkable for a prostate volume of 60 cc.  There was a PI-RADS 4 lesion in the left prostate extending from the mid gland to the apex in the posterior lateral PZ.  There was no evidence of trans-capsular or seminal vesicle abnormalities.  No pelvic adenopathy was present.  He is having bothersome lower urinary tract symptoms primarily hesitancy and decreased force and caliber of his urinary stream at night.  He is on tamsulosin/finasteride.  PMH: Past Medical History:  Diagnosis Date  . Arthritis    neck, shoulders, hips, knees  . Asthma   . Barrett esophagus   . BPH (benign prostatic hyperplasia)   . Coronary artery disease    annual stress with Dr. Humphrey Rolls. no new findings  . Depression   . Diabetes mellitus without complication ([facility])   . Diverticulosis   . Duodenitis   . Gastritis   . GERD (gastroesophageal reflux disease)   . Hypercholesteremia   . Hypertension   . Kidney stone   . Neuromuscular disorder ([facility])    neuropathy - feet  . Occasional tremors    hands  . Renal insufficiency   . Sleep apnea    doesn't wear CPAP  . Wears dentures    full upper     Surgical History: Past Surgical History:  Procedure Laterality Date  . APPENDECTOMY    . CARDIAC CATHETERIZATION  1999, 2000   stents placed  . CATARACT EXTRACTION W/PHACO Left 10/29/2014   Procedure: CATARACT EXTRACTION PHACO AND INTRAOCULAR LENS PLACEMENT (IOC);  Surgeon: Leandrew Koyanagi, MD;  Location: [facility];  Service: Ophthalmology;  Laterality: Left;  DIABETIC - insulin MALYUGIN  . CERVICAL FUSION    . ESOPHAGOGASTRODUODENOSCOPY N/A 12/29/2017   Procedure: ESOPHAGOGASTRODUODENOSCOPY (EGD);  Surgeon: [name], MD;  Location: [facility] ENDOSCOPY;  Service: Endoscopy;  Laterality: N/A;  . ESOPHAGOGASTRODUODENOSCOPY (EGD) WITH PROPOFOL N/A 12/17/2015   Procedure: ESOPHAGOGASTRODUODENOSCOPY (EGD) WITH PROPOFOL;  Surgeon: [name], MD;  Location: [facility] ENDOSCOPY;  Service: Endoscopy;  Laterality: N/A;  . EYE SURGERY    . HERNIA REPAIR    . JOINT REPLACEMENT     knee  . KNEE SURGERY Right    joint removal with fusion and graft  . NISSEN FUNDOPLICATION      Home Medications:  Allergies as of 04/24/2018      Reactions   Macrolides And Ketolides Swelling   "-mycin" drugs "-mycin" drugs "-mycin" drugs   Erythromycin    Other  reaction(s): Unknown   Propranolol    Sulfa Antibiotics Swelling, Other (See Comments)   Other reaction(s): Other (See Comments)   Sulfasalazine Swelling   Tapentadol Other (See Comments)   Heavy tape causes raw skin   Telbivudine Swelling   "mycin" drugs   Tape Other (See Comments), Rash   Heavy tape causes raw skin      Medication List       Accurate as of April 24, 2018 11:59 PM. Always use your most recent med list.        ACCU-CHEK AVIVA PLUS test strip Generic drug:  glucose blood USE 1 STRIP TO CHECK GLUCOSE 4 TIMES DAILY   amLODipine 10 MG tablet Commonly known as:  NORVASC Take 10 mg by mouth daily. AM   aspirin 325 MG tablet Take 325 mg by mouth daily. AM   benazepril 40 MG tablet Commonly known  as:  LOTENSIN   carbidopa-levodopa 50-200 MG tablet Commonly known as:  SINEMET CR Take 1 tablet by mouth 3 (three) times daily.   citalopram 10 MG tablet Commonly known as:  CELEXA Take 10 mg by mouth daily.   docusate sodium 100 MG capsule Commonly known as:  COLACE Take 100 mg by mouth daily.   fexofenadine 180 MG tablet Commonly known as:  ALLEGRA Take by mouth.   finasteride 5 MG tablet Commonly known as:  PROSCAR Take 1 tablet (5 mg total) by mouth daily.   furosemide 20 MG tablet Commonly known as:  LASIX Take 20 mg by mouth.   gabapentin 300 MG capsule Commonly known as:  NEURONTIN Take 300 mg by mouth 2 (two) times daily.   hydrALAZINE 25 MG tablet Commonly known as:  APRESOLINE hydralazine 25 mg tablet   insulin aspart protamine- aspart (70-30) 100 UNIT/ML injection Commonly known as:  NOVOLOG MIX 70/30 Inject 12-28 Units into the skin 2 (two) times daily with a meal. 12 units before breakfast and 28 units before dinner   insulin lispro protamine-lispro (75-25) 100 UNIT/ML Susp injection Commonly known as:  HUMALOG 75/25 MIX Inject 28 Units into the skin 2 (two) times daily with a meal.   insulin NPH-regular Human (70-30) 100 UNIT/ML injection Inject 5 Units into the skin daily with breakfast.   ipratropium-albuterol 0.5-2.5 (3) MG/3ML Soln Commonly known as:  DUONEB Take 3 mLs by nebulization every 6 (six) hours as needed.   magnesium 30 MG tablet Take 30 mg by mouth 2 (two) times daily.   magnesium oxide 400 MG tablet Commonly known as:  MAG-OX Take 400 mg by mouth daily.   Melatonin 10 MG Tabs Take by mouth.   MUCINEX 600 MG 12 hr tablet Generic drug:  guaiFENesin Take 600 mg by mouth 2 (two) times daily.   mupirocin ointment 2 % Commonly known as:  BACTROBAN Place 1 application into the nose 2 (two) times daily.   naproxen sodium 220 MG tablet Commonly known as:  ALEVE Take 220 mg by mouth.   nitroGLYCERIN 0.4 MG SL tablet Commonly  known as:  NITROSTAT Place under the tongue.   omeprazole 40 MG capsule Commonly known as:  PRILOSEC Take 40 mg by mouth daily. PM   rosuvastatin 40 MG tablet Commonly known as:  CRESTOR Take 40 mg by mouth daily.   saccharomyces boulardii 250 MG capsule Commonly known as:  FLORASTOR Take 250 mg by mouth 2 (two) times daily.   SYMBICORT 80-4.5 MCG/ACT inhaler Generic drug:  budesonide-formoterol Inhale 2 puffs into the lungs.  tamsulosin 0.4 MG Caps capsule Commonly known as:  FLOMAX Take 1 capsule (0.4 mg total) by mouth daily after breakfast.   traMADol 50 MG tablet Commonly known as:  ULTRAM Take 50 mg by mouth every 6 (six) hours as needed.       Allergies:  Allergies  Allergen Reactions  . Macrolides And Ketolides Swelling    "-mycin" drugs "-mycin" drugs "-mycin" drugs   . Erythromycin     Other reaction(s): Unknown  . Propranolol   . Sulfa Antibiotics Swelling and Other (See Comments)    Other reaction(s): Other (See Comments)  . Sulfasalazine Swelling  . Tapentadol Other (See Comments)    Heavy tape causes raw skin  . Telbivudine Swelling    "mycin" drugs   . Tape Other (See Comments) and Rash    Heavy tape causes raw skin    Family History: Family History  Problem Relation Age of Onset  . Prostate cancer Father   . Heart disease Brother   . Stroke Mother   . Breast cancer Sister     Social History:  reports that he quit smoking about 45 years ago. His smoking use included cigarettes, pipe, and cigars. He has a 20.00 pack-year smoking history. His smokeless tobacco use includes chew. He reports previous alcohol use. He reports that he does not use drugs.  ROS: UROLOGY Frequent Urination?: No Hard to postpone urination?: No Burning/pain with urination?: No Get up at night to urinate?: No Leakage of urine?: No Urine stream starts and stops?: No Trouble starting stream?: Yes Do you have to strain to urinate?: Yes Blood in urine?:  No Urinary tract infection?: No Sexually transmitted disease?: No Injury to kidneys or bladder?: No Painful intercourse?: No Weak stream?: No Erection problems?: No Penile pain?: No Currently pregnant?: No Vaginal bleeding?: No Last menstrual period?: n  Gastrointestinal Nausea?: No Vomiting?: No Indigestion/heartburn?: No Diarrhea?: No Constipation?: No  Constitutional Fever: No Night sweats?: No Weight loss?: No Fatigue?: No  Skin Skin rash/lesions?: No Itching?: No  Eyes Blurred vision?: No Double vision?: No  Ears/Nose/Throat Sore throat?: No Sinus problems?: No  Hematologic/Lymphatic Swollen glands?: No Easy bruising?: Yes  Cardiovascular Leg swelling?: No Chest pain?: No  Respiratory Cough?: No Shortness of breath?: No  Endocrine Excessive thirst?: No  Musculoskeletal Back pain?: No Joint pain?: No  Neurological Headaches?: No Dizziness?: No  Psychologic Depression?: Yes Anxiety?: No  Physical Exam: BP (!) 105/57 (BP Location: Left Arm, Patient Position: Sitting, Cuff Size: Normal)   Pulse 77   Ht 6' (1.829 m)   BMI 20.21 kg/m   Constitutional:  Alert and oriented, No acute distress.   Assessment & Plan:   83 year old male with a corrected PSA of 13.6 and prostate MRI showing a PI-RADS 4 lesion in the left PZ.  We discussed the association of MRI abnormalities with Gleason 7 or greater prostate cancer.  Fusion biopsy was discussed and he would like to think over before scheduling.  He does have bothersome lower urinary tract symptoms.  He is on maximum medical management.  His most bothersome symptoms are obstructive voiding symptoms at night.  Will give a trial of silodosin in the early evening  Abbie Sons, MD  [facility] [address] [place], [place] [address] [phone]

## 2018-04-25 NOTE — Telephone Encounter (Signed)
Pt states that Dr. Bernardo Heater prescribed him a medication yesterday, pt couldn't recall the medication, I found  silodosin in the chart from the office visit. Pt went to [facility] to pick up medication states that the pharmacy said it was expensive and said they faxed [facility] to make Dr. Bernardo Heater aware. Pt is following up. Please advise. Thanks.

## 2018-04-27 NOTE — Telephone Encounter (Signed)
He is already on tamsulosin and finasteride so there are no other effective alternative medications.  If he elects to have a fusion biopsy and it is negative we could discuss UroLift.

## 2018-04-27 NOTE — Telephone Encounter (Signed)
Spoke to patient and he will continue on his original medications. He cannot afford the Rapaflo.

## 2018-04-27 NOTE — Telephone Encounter (Signed)
Pt wants to know if there is something cheaper as the original Rx was too expensive. Please advise. Thanks.

## 2018-04-27 NOTE — Telephone Encounter (Signed)
Do you have any other medications that may be more cost effective for patient?

## 2018-05-08 NOTE — Procedures (Signed)
[place], [id number]  DATE OF SERVICE: April 25, 2018  Complete Pulmonary Function Testing Interpretation:  FINDINGS:  The forced vital capacity is normal.  The FEV1 is 2.65 L and is 89% of predicted.  The FEV1 FVC ratio is moderately decreased.  Postbronchodilator there is no significant change in the FEV1 however clinical improvement may occur in the absence of spirometric improvement.  Total capacity is normal residual volume is normal residual volume capacity ratio is increased the DLCO is within normal limits.  IMPRESSION:  This pulmonary function study is consistent with normal spirometry some normal lung volumes.  The DLCO is also normal.  Allyne Gee, MD [facility] Pulmonary Critical Care Medicine Sleep Medicine

## 2018-05-15 ENCOUNTER — Telehealth: Payer: Self-pay | Admitting: Urology

## 2018-05-15 DIAGNOSIS — R972 Elevated prostate specific antigen [PSA]: Secondary | ICD-10-CM

## 2018-05-15 NOTE — Telephone Encounter (Signed)
Pt stopped by office and would like to proceed with Fusion Biopsy.

## 2018-05-15 NOTE — Telephone Encounter (Signed)
Order was entered 

## 2018-05-15 NOTE — Telephone Encounter (Signed)
See the message below  Will need a referral placed  Thanks, Sharyn Lull

## 2018-05-25 ENCOUNTER — Telehealth: Payer: Self-pay | Admitting: Urology

## 2018-05-25 NOTE — Telephone Encounter (Signed)
I would recommend coming off the aspirin

## 2018-05-25 NOTE — Telephone Encounter (Signed)
We will need someone to get a clearance for the patient to come off of his aspirin for him to have a fusion Bx see the message below.  Thanks, Sharyn Lull

## 2018-05-25 NOTE — Telephone Encounter (Signed)
Spoke with Todd Davidson @ [facility] and she said the patient told her that he was currently taking a 325 mg aspirin due to a stent in his heart he  had placed years ago by Dr. Humphrey Rolls. She wants to know if he needs to come off of this prior to having the Fusion BX?   Todd Davidson

## 2018-05-28 NOTE — Telephone Encounter (Signed)
Clearance has been faxed.  

## 2018-05-31 ENCOUNTER — Other Ambulatory Visit: Payer: Self-pay

## 2018-05-31 DIAGNOSIS — J449 Chronic obstructive pulmonary disease, unspecified: Secondary | ICD-10-CM

## 2018-05-31 MED ORDER — IPRATROPIUM-ALBUTEROL 0.5-2.5 (3) MG/3ML IN SOLN: 3.0000 mL | Freq: Four times a day (QID) | RESPIRATORY_TRACT | 4 refills | Status: DC | PRN

## 2018-06-05 ENCOUNTER — Telehealth: Payer: Self-pay | Admitting: Family Medicine

## 2018-06-05 NOTE — Telephone Encounter (Signed)
Patient notified he may stop the ASA 7 days prior to procedure.

## 2018-06-25 ENCOUNTER — Telehealth: Payer: Self-pay | Admitting: Urology

## 2018-06-25 ENCOUNTER — Other Ambulatory Visit: Payer: Self-pay | Admitting: Urology

## 2018-06-25 NOTE — Telephone Encounter (Signed)
Patient came into the office today requesting results of his MRI fusion biopsy that he had at [facility] on 06/14/2018.  Results were obtained and scanned into the patient's chart.    Do you want to call the patient or have him come into the office for results?  Patient can be reached at [phone].

## 2018-06-25 NOTE — Telephone Encounter (Signed)
Please schedule a virtual or telephone visit.

## 2018-06-27 ENCOUNTER — Telehealth (INDEPENDENT_AMBULATORY_CARE_PROVIDER_SITE_OTHER): Payer: Medicare Other | Admitting: Urology

## 2018-06-27 ENCOUNTER — Other Ambulatory Visit: Payer: Self-pay

## 2018-06-27 DIAGNOSIS — C61 Malignant neoplasm of prostate: Secondary | ICD-10-CM

## 2018-06-27 NOTE — Progress Notes (Signed)
Virtual Visit via Telephone Note  I connected with Todd Davidson on 06/27/18 at 10:30 AM EDT by telephone and verified that I am speaking with the correct person using two identifiers.   I discussed the limitations, risks, security and privacy concerns of performing an evaluation and management service by telephone and the availability of in person appointments. We discussed the impact of the COVID-19 on the healthcare system, and the importance of social distancing and reducing patient and provider exposure. I also discussed with the patient that there may be a patient responsible charge related to this service. The patient expressed understanding and agreed to proceed.  Reason for visit: Prostate biopsy report  History of Present Illness: 83 year old male with a history of an elevated PSA which rose to 13.6 (corrected value on finasteride).  He agreed to a prostate MRI which showed a PI-RADS 4 lesion in the left mid/apical prostate.  Prostate volume was 63 cc.  He had no post biopsy complaints.  Pathology: The ROI lesion showed 1 of 2 cores positive for Gleason 3+4 adenocarcinoma (10%).  Additionally there was Gleason 3+3 adenocarcinoma (5%) LML and Gleason 3+4 adenocarcinoma (40%) LM.      Assessment and Plan: The pathology report was discussed in detail with Todd Davidson.  He is favorable intermediate risk prostate cancer by NCCN guidelines.  His life expectancy based on SSA tables is <10 years.  We discussed management options of observation and radiation therapy.  He is undecided at this time but would like more information on radiation therapy and will refer to radiation oncology.  Follow Up: 1.  Radiation oncology referral 2.  Follow-up 3 months in the event he elects surveillance.   I discussed the assessment and treatment plan with the patient. The patient was provided an opportunity to ask questions and all were answered. The patient agreed with the plan and demonstrated an  understanding of the instructions.   The patient was advised to call back or seek an in-person evaluation if the symptoms worsen or if the condition fails to improve as anticipated.  I provided 12 minutes of non-face-to-face time during this encounter.   Abbie Sons, MD

## 2018-07-02 ENCOUNTER — Other Ambulatory Visit: Payer: 59

## 2018-07-02 ENCOUNTER — Ambulatory Visit: Payer: 59 | Admitting: Hematology and Oncology

## 2018-07-04 ENCOUNTER — Other Ambulatory Visit: Payer: Self-pay | Admitting: *Deleted

## 2018-07-04 ENCOUNTER — Other Ambulatory Visit: Payer: Self-pay

## 2018-07-04 DIAGNOSIS — N400 Enlarged prostate without lower urinary tract symptoms: Secondary | ICD-10-CM

## 2018-07-04 DIAGNOSIS — R972 Elevated prostate specific antigen [PSA]: Secondary | ICD-10-CM

## 2018-07-04 MED ORDER — TAMSULOSIN HCL 0.4 MG PO CAPS: 0.4000 mg | ORAL_CAPSULE | Freq: Every day | ORAL | 3 refills | Status: DC

## 2018-07-04 MED ORDER — FINASTERIDE 5 MG PO TABS: 5.0000 mg | ORAL_TABLET | Freq: Every day | ORAL | 3 refills | Status: DC

## 2018-07-05 ENCOUNTER — Ambulatory Visit
Admission: RE | Admit: 2018-07-05 | Discharge: 2018-07-05 | Disposition: A | Discharge status: Home / Self Care | Payer: Medicare Other | Source: Ambulatory Visit | Attending: Radiation Oncology | Admitting: Radiation Oncology

## 2018-07-05 ENCOUNTER — Other Ambulatory Visit: Payer: Self-pay

## 2018-07-05 ENCOUNTER — Encounter: Payer: Self-pay | Admitting: Radiation Oncology

## 2018-07-05 VITALS — BP 136/71 | HR 76 | Resp 18

## 2018-07-05 DIAGNOSIS — Z87891 Personal history of nicotine dependence: Secondary | ICD-10-CM | POA: Insufficient documentation

## 2018-07-05 DIAGNOSIS — Z79899 Other long term (current) drug therapy: Secondary | ICD-10-CM | POA: Insufficient documentation

## 2018-07-05 DIAGNOSIS — C61 Malignant neoplasm of prostate: Secondary | ICD-10-CM | POA: Diagnosis present

## 2018-07-05 DIAGNOSIS — F329 Major depressive disorder, single episode, unspecified: Secondary | ICD-10-CM | POA: Insufficient documentation

## 2018-07-05 DIAGNOSIS — M1990 Unspecified osteoarthritis, unspecified site: Secondary | ICD-10-CM | POA: Insufficient documentation

## 2018-07-05 DIAGNOSIS — E119 Type 2 diabetes mellitus without complications: Secondary | ICD-10-CM | POA: Diagnosis not present

## 2018-07-05 DIAGNOSIS — R972 Elevated prostate specific antigen [PSA]: Secondary | ICD-10-CM | POA: Insufficient documentation

## 2018-07-05 DIAGNOSIS — J45909 Unspecified asthma, uncomplicated: Secondary | ICD-10-CM | POA: Diagnosis not present

## 2018-07-05 DIAGNOSIS — G4730 Sleep apnea, unspecified: Secondary | ICD-10-CM | POA: Diagnosis not present

## 2018-07-05 DIAGNOSIS — Z794 Long term (current) use of insulin: Secondary | ICD-10-CM | POA: Insufficient documentation

## 2018-07-05 DIAGNOSIS — R351 Nocturia: Secondary | ICD-10-CM | POA: Insufficient documentation

## 2018-07-05 DIAGNOSIS — K219 Gastro-esophageal reflux disease without esophagitis: Secondary | ICD-10-CM | POA: Diagnosis not present

## 2018-07-05 DIAGNOSIS — Z7982 Long term (current) use of aspirin: Secondary | ICD-10-CM | POA: Insufficient documentation

## 2018-07-05 DIAGNOSIS — Z87442 Personal history of urinary calculi: Secondary | ICD-10-CM | POA: Insufficient documentation

## 2018-07-05 DIAGNOSIS — I2510 Atherosclerotic heart disease of native coronary artery without angina pectoris: Secondary | ICD-10-CM | POA: Insufficient documentation

## 2018-07-05 DIAGNOSIS — I10 Essential (primary) hypertension: Secondary | ICD-10-CM | POA: Insufficient documentation

## 2018-07-05 DIAGNOSIS — R251 Tremor, unspecified: Secondary | ICD-10-CM | POA: Insufficient documentation

## 2018-07-05 DIAGNOSIS — E7800 Pure hypercholesterolemia, unspecified: Secondary | ICD-10-CM | POA: Diagnosis not present

## 2018-07-05 NOTE — Consult Note (Signed)
NEW PATIENT EVALUATION  Name: Todd Davidson  MRN: [id number]  Date:   07/05/2018     DOB: 1935-05-27   This 83 y.o. male patient presents to the clinic for initial evaluation of stage IIb (T1 cN0 M0) Gleason 7 (3+4) adenocarcinoma the prostate presenting with a PSA of 13.6.  REFERRING PHYSICIAN: Abbie Sons, MD  CHIEF COMPLAINT:  Chief Complaint  Patient presents with  . Prostate Cancer    Initial consultation    DIAGNOSIS: The encounter diagnosis was Malignant neoplasm of prostate ([place]).   PREVIOUS INVESTIGATIONS:  Pathology report reviewed MRI of prostate reviewed Clinical notes reviewed  HPI: Patient is an 83 year old male who is been tracking elevated PSA over 10 for the past 2 years.  Most recent PSA adjusted for finasteride was 13.6.  He had a prostate MRI by Dr. Bernardo Heater recently showing a PI-RADS category 4 lesion extending from the mid gland to the apex in the left posterior lateral peripheral zone.  This area is targeted with biopsy and was positive for 1 of 2 biopsies a Gleason 7 (3+4) adenocarcinoma.  Patient has multiple medical comorbidities including coronary artery disease with stents significant arthritis adult-onset diabetes a neuromuscular disorder with occasional tremors.  He states he has nocturia x1-2 no significant urgency or frequency.  He is seen today for radiation oncology opinion.  PLANNED TREATMENT REGIMEN: IMRT radiation therapy  PAST MEDICAL HISTORY:  has a past medical history of Arthritis, Asthma, Barrett esophagus, BPH (benign prostatic hyperplasia), Coronary artery disease, Depression, Diabetes mellitus without complication ([place]), Diverticulosis, Duodenitis, Gastritis, GERD (gastroesophageal reflux disease), Hypercholesteremia, Hypertension, Kidney stone, Neuromuscular disorder ([place]), Occasional tremors, Renal insufficiency, Sleep apnea, and Wears dentures.    PAST SURGICAL HISTORY:  Past Surgical History:  Procedure Laterality Date  .  APPENDECTOMY    . CARDIAC CATHETERIZATION  1999, 2000   stents placed  . CATARACT EXTRACTION W/PHACO Left 10/29/2014   Procedure: CATARACT EXTRACTION PHACO AND INTRAOCULAR LENS PLACEMENT (IOC);  Surgeon: Leandrew Koyanagi, MD;  Location: [facility];  Service: Ophthalmology;  Laterality: Left;  DIABETIC - insulin MALYUGIN  . CERVICAL FUSION    . ESOPHAGOGASTRODUODENOSCOPY N/A 12/29/2017   Procedure: ESOPHAGOGASTRODUODENOSCOPY (EGD);  Surgeon: [name], MD;  Location[facility] ENDOSCOPY;  Service: Endoscopy;  Laterality: N/A;  . ESOPHAGOGASTRODUODENOSCOPY (EGD) WITH PROPOFOL N/A 12/17/2015   Procedure: ESOPHAGOGASTRODUODENOSCOPY (EGD) WITH PROPOFOL;  Surgeon: [name], MD;  Location: [facility] ENDOSCOPY;  Service: Endoscopy;  Laterality: N/A;  . EYE SURGERY    . HERNIA REPAIR    . JOINT REPLACEMENT     knee  . KNEE SURGERY Right    joint removal with fusion and graft  . NISSEN FUNDOPLICATION      FAMILY HISTORY: family history includes Breast cancer in his sister; Heart disease in his brother; Prostate cancer in his father; Stroke in his mother.  SOCIAL HISTORY:  reports that he quit smoking about 45 years ago. His smoking use included cigarettes, pipe, and cigars. He has a 20.00 pack-year smoking history. His smokeless tobacco use includes chew. He reports previous alcohol use. He reports that he does not use drugs.  ALLERGIES: Macrolides and ketolides; Erythromycin; Propranolol; Sulfa antibiotics; Sulfasalazine; Tapentadol; Telbivudine; and Tape  MEDICATIONS:  Current Outpatient Medications  Medication Sig Dispense Refill  . ACCU-CHEK AVIVA PLUS test strip USE 1 STRIP TO CHECK GLUCOSE 4 TIMES DAILY  1  . amLODipine (NORVASC) 10 MG tablet Take 10 mg by mouth daily. AM    . aspirin 325  MG tablet Take 325 mg by mouth daily. AM    . benazepril (LOTENSIN) 40 MG tablet     . citalopram (CELEXA) 10 MG tablet Take 10 mg by mouth daily.     . fexofenadine (ALLEGRA) 180 MG  tablet Take by mouth.    . finasteride (PROSCAR) 5 MG tablet Take 1 tablet (5 mg total) by mouth daily. 90 tablet 3  . gabapentin (NEURONTIN) 300 MG capsule Take 300 mg by mouth 2 (two) times daily.    . insulin aspart protamine- aspart (NOVOLOG MIX 70/30) (70-30) 100 UNIT/ML injection Inject 12-28 Units into the skin 2 (two) times daily with a meal. 12 units before breakfast and 28 units before dinner    . insulin NPH-regular Human (NOVOLIN 70/30) (70-30) 100 UNIT/ML injection Inject 5 Units into the skin daily with breakfast.    . ipratropium-albuterol (DUONEB) 0.5-2.5 (3) MG/3ML SOLN Take 3 mLs by nebulization every 6 (six) hours as needed. 360 mL 4  . magnesium 30 MG tablet Take 30 mg by mouth 2 (two) times daily.    . magnesium oxide (MAG-OX) 400 MG tablet Take 400 mg by mouth daily.    . Melatonin 10 MG TABS Take by mouth.    . mupirocin ointment (BACTROBAN) 2 % Place 1 application into the nose 2 (two) times daily.    . naproxen sodium (ALEVE) 220 MG tablet Take 220 mg by mouth.    . nitroGLYCERIN (NITROSTAT) 0.4 MG SL tablet Place under the tongue.    Marland Kitchen omeprazole (PRILOSEC) 40 MG capsule Take 40 mg by mouth daily. PM    . rosuvastatin (CRESTOR) 40 MG tablet Take 40 mg by mouth daily.     Marland Kitchen saccharomyces boulardii (FLORASTOR) 250 MG capsule Take 250 mg by mouth 2 (two) times daily.    . tamsulosin (FLOMAX) 0.4 MG CAPS capsule Take 1 capsule (0.4 mg total) by mouth daily after breakfast. 90 capsule 3  . budesonide-formoterol (SYMBICORT) 80-4.5 MCG/ACT inhaler Inhale 2 puffs into the lungs.     . fluticasone (FLONASE) 50 MCG/ACT nasal spray USE 1 SPRAY(S) IN EACH NOSTRIL AT BEDTIME FOR NASAL CONGESTION BEND HEAD FORWARD     No current facility-administered medications for this encounter.     ECOG PERFORMANCE STATUS:  0 - Asymptomatic  REVIEW OF SYSTEMS: Patient denies any weight loss, fatigue, weakness, fever, chills or night sweats. Patient denies any loss of vision, blurred vision.  Patient denies any ringing  of the ears or hearing loss. No irregular heartbeat. Patient denies heart murmur or history of fainting. Patient denies any chest pain or pain radiating to her upper extremities. Patient denies any shortness of breath, difficulty breathing at night, cough or hemoptysis. Patient denies any swelling in the lower legs. Patient denies any nausea vomiting, vomiting of blood, or coffee ground material in the vomitus. Patient denies any stomach pain. Patient states has had normal bowel movements no significant constipation or diarrhea. Patient denies any dysuria, hematuria or significant nocturia. Patient denies any problems walking, swelling in the joints or loss of balance. Patient denies any skin changes, loss of hair or loss of weight. Patient denies any excessive worrying or anxiety or significant depression. Patient denies any problems with insomnia. Patient denies excessive thirst, polyuria, polydipsia. Patient denies any swollen glands, patient denies easy bruising or easy bleeding. Patient denies any recent infections, allergies or URI. Patient "s visual fields have not changed significantly in recent time.   PHYSICAL EXAM: BP 136/71   Pulse  31   Resp 8  Elderly male with some decreased range of motion of his lower extremities wheelchair-bound.  Well-developed well-nourished patient in NAD. HEENT reveals PERLA, EOMI, discs not visualized.  Oral cavity is clear. No oral mucosal lesions are identified. Neck is clear without evidence of cervical or supraclavicular adenopathy. Lungs are clear to A&P. Cardiac examination is essentially unremarkable with regular rate and rhythm without murmur rub or thrill. Abdomen is benign with no organomegaly or masses noted. Motor sensory and DTR levels are equal and symmetric in the upper and lower extremities. Cranial nerves II through XII are grossly intact. Proprioception is intact. No peripheral adenopathy or edema is identified. No motor or  sensory levels are noted. Crude visual fields are within normal range.  LABORATORY DATA: Pathology reports reviewed    RADIOLOGY RESULTS: Prostate MRI scan reviewed   IMPRESSION: Stage IIb Gleason 7 (3+4) adenocarcinoma the prostate presenting with a PSA of 73.41 in 83 year old male  PLAN: We had a long discussion today about continue to observe the patient versus going ahead with treatment.  Patient is concerned about developing metastatic disease in the future.  He is interested in treatment.  I have explained to him the risks and benefits of IMRT radiation therapy.  I would plan on delivering 80 Gy over 8 weeks using IMRT treatment planning and delivery.  Risks and benefits of treatment occluding increased lower urinary tract symptoms diarrhea fatigue alteration of blood counts and skin reaction all were described in detail to the patient.  I will not target his pelvic nodes.  I have personally set up and ordered CT simulation for next week.  Patient comprehends my treatment plan well.  I would like to take this opportunity to thank you for allowing me to participate in the care of your patient.Noreene Filbert, MD

## 2018-07-10 ENCOUNTER — Other Ambulatory Visit: Payer: Self-pay

## 2018-07-11 ENCOUNTER — Other Ambulatory Visit: Payer: Self-pay

## 2018-07-11 ENCOUNTER — Ambulatory Visit
Admission: RE | Admit: 2018-07-11 | Discharge: 2018-07-11 | Disposition: A | Discharge status: Home / Self Care | Payer: Medicare Other | Source: Ambulatory Visit | Attending: Radiation Oncology | Admitting: Radiation Oncology

## 2018-07-11 DIAGNOSIS — C61 Malignant neoplasm of prostate: Secondary | ICD-10-CM | POA: Insufficient documentation

## 2018-07-11 DIAGNOSIS — E119 Type 2 diabetes mellitus without complications: Secondary | ICD-10-CM | POA: Diagnosis not present

## 2018-07-11 DIAGNOSIS — Z794 Long term (current) use of insulin: Secondary | ICD-10-CM | POA: Insufficient documentation

## 2018-07-11 DIAGNOSIS — K219 Gastro-esophageal reflux disease without esophagitis: Secondary | ICD-10-CM | POA: Insufficient documentation

## 2018-07-11 DIAGNOSIS — I2510 Atherosclerotic heart disease of native coronary artery without angina pectoris: Secondary | ICD-10-CM | POA: Insufficient documentation

## 2018-07-11 DIAGNOSIS — I10 Essential (primary) hypertension: Secondary | ICD-10-CM | POA: Diagnosis not present

## 2018-07-11 DIAGNOSIS — N400 Enlarged prostate without lower urinary tract symptoms: Secondary | ICD-10-CM | POA: Diagnosis not present

## 2018-07-11 DIAGNOSIS — Z510 Encounter for antineoplastic radiation therapy: Secondary | ICD-10-CM | POA: Insufficient documentation

## 2018-07-11 DIAGNOSIS — Z79899 Other long term (current) drug therapy: Secondary | ICD-10-CM | POA: Diagnosis not present

## 2018-07-11 DIAGNOSIS — G4730 Sleep apnea, unspecified: Secondary | ICD-10-CM | POA: Insufficient documentation

## 2018-07-11 DIAGNOSIS — Z7982 Long term (current) use of aspirin: Secondary | ICD-10-CM | POA: Insufficient documentation

## 2018-07-16 DIAGNOSIS — Z510 Encounter for antineoplastic radiation therapy: Secondary | ICD-10-CM | POA: Diagnosis not present

## 2018-07-20 ENCOUNTER — Other Ambulatory Visit: Payer: Self-pay

## 2018-07-20 ENCOUNTER — Other Ambulatory Visit: Payer: Self-pay | Admitting: *Deleted

## 2018-07-20 DIAGNOSIS — C61 Malignant neoplasm of prostate: Secondary | ICD-10-CM

## 2018-07-23 ENCOUNTER — Ambulatory Visit
Admission: RE | Admit: 2018-07-23 | Discharge: 2018-07-23 | Disposition: A | Discharge status: Home / Self Care | Payer: Medicare Other | Source: Ambulatory Visit | Attending: Radiation Oncology | Admitting: Radiation Oncology

## 2018-07-23 DIAGNOSIS — N400 Enlarged prostate without lower urinary tract symptoms: Secondary | ICD-10-CM | POA: Insufficient documentation

## 2018-07-23 DIAGNOSIS — C61 Malignant neoplasm of prostate: Secondary | ICD-10-CM | POA: Insufficient documentation

## 2018-07-23 DIAGNOSIS — Z79899 Other long term (current) drug therapy: Secondary | ICD-10-CM | POA: Insufficient documentation

## 2018-07-23 DIAGNOSIS — Z7982 Long term (current) use of aspirin: Secondary | ICD-10-CM | POA: Insufficient documentation

## 2018-07-23 DIAGNOSIS — G4730 Sleep apnea, unspecified: Secondary | ICD-10-CM | POA: Insufficient documentation

## 2018-07-23 DIAGNOSIS — I2510 Atherosclerotic heart disease of native coronary artery without angina pectoris: Secondary | ICD-10-CM | POA: Insufficient documentation

## 2018-07-23 DIAGNOSIS — E119 Type 2 diabetes mellitus without complications: Secondary | ICD-10-CM | POA: Insufficient documentation

## 2018-07-23 DIAGNOSIS — Z510 Encounter for antineoplastic radiation therapy: Secondary | ICD-10-CM | POA: Insufficient documentation

## 2018-07-23 DIAGNOSIS — Z794 Long term (current) use of insulin: Secondary | ICD-10-CM | POA: Insufficient documentation

## 2018-07-23 DIAGNOSIS — I10 Essential (primary) hypertension: Secondary | ICD-10-CM | POA: Insufficient documentation

## 2018-07-23 DIAGNOSIS — K219 Gastro-esophageal reflux disease without esophagitis: Secondary | ICD-10-CM | POA: Insufficient documentation

## 2018-07-24 ENCOUNTER — Ambulatory Visit
Admission: RE | Admit: 2018-07-24 | Discharge: 2018-07-24 | Disposition: A | Discharge status: Home / Self Care | Payer: Medicare Other | Source: Ambulatory Visit | Attending: Radiation Oncology | Admitting: Radiation Oncology

## 2018-07-24 ENCOUNTER — Other Ambulatory Visit: Payer: Self-pay

## 2018-07-24 DIAGNOSIS — Z7982 Long term (current) use of aspirin: Secondary | ICD-10-CM | POA: Diagnosis not present

## 2018-07-24 DIAGNOSIS — G4730 Sleep apnea, unspecified: Secondary | ICD-10-CM | POA: Diagnosis not present

## 2018-07-24 DIAGNOSIS — Z794 Long term (current) use of insulin: Secondary | ICD-10-CM | POA: Diagnosis not present

## 2018-07-24 DIAGNOSIS — C61 Malignant neoplasm of prostate: Secondary | ICD-10-CM | POA: Diagnosis not present

## 2018-07-24 DIAGNOSIS — Z510 Encounter for antineoplastic radiation therapy: Secondary | ICD-10-CM | POA: Diagnosis present

## 2018-07-24 DIAGNOSIS — I2510 Atherosclerotic heart disease of native coronary artery without angina pectoris: Secondary | ICD-10-CM | POA: Diagnosis not present

## 2018-07-24 DIAGNOSIS — Z79899 Other long term (current) drug therapy: Secondary | ICD-10-CM | POA: Diagnosis not present

## 2018-07-24 DIAGNOSIS — E119 Type 2 diabetes mellitus without complications: Secondary | ICD-10-CM | POA: Diagnosis not present

## 2018-07-24 DIAGNOSIS — K219 Gastro-esophageal reflux disease without esophagitis: Secondary | ICD-10-CM | POA: Diagnosis not present

## 2018-07-24 DIAGNOSIS — N400 Enlarged prostate without lower urinary tract symptoms: Secondary | ICD-10-CM | POA: Diagnosis not present

## 2018-07-24 DIAGNOSIS — I10 Essential (primary) hypertension: Secondary | ICD-10-CM | POA: Diagnosis not present

## 2018-07-25 ENCOUNTER — Other Ambulatory Visit: Payer: Self-pay

## 2018-07-25 ENCOUNTER — Ambulatory Visit
Admission: RE | Admit: 2018-07-25 | Discharge: 2018-07-25 | Disposition: A | Discharge status: Home / Self Care | Payer: Medicare Other | Source: Ambulatory Visit | Attending: Radiation Oncology | Admitting: Radiation Oncology

## 2018-07-25 DIAGNOSIS — Z510 Encounter for antineoplastic radiation therapy: Secondary | ICD-10-CM | POA: Diagnosis not present

## 2018-07-26 ENCOUNTER — Other Ambulatory Visit: Payer: Self-pay

## 2018-07-26 ENCOUNTER — Ambulatory Visit
Admission: RE | Admit: 2018-07-26 | Discharge: 2018-07-26 | Disposition: A | Discharge status: Home / Self Care | Payer: Medicare Other | Source: Ambulatory Visit | Attending: Radiation Oncology | Admitting: Radiation Oncology

## 2018-07-26 DIAGNOSIS — Z510 Encounter for antineoplastic radiation therapy: Secondary | ICD-10-CM | POA: Diagnosis not present

## 2018-07-27 ENCOUNTER — Other Ambulatory Visit: Payer: Self-pay

## 2018-07-27 ENCOUNTER — Ambulatory Visit
Admission: RE | Admit: 2018-07-27 | Discharge: 2018-07-27 | Disposition: A | Discharge status: Home / Self Care | Payer: Medicare Other | Source: Ambulatory Visit | Attending: Radiation Oncology | Admitting: Radiation Oncology

## 2018-07-27 DIAGNOSIS — Z510 Encounter for antineoplastic radiation therapy: Secondary | ICD-10-CM | POA: Diagnosis not present

## 2018-07-30 ENCOUNTER — Other Ambulatory Visit: Payer: Self-pay

## 2018-07-30 ENCOUNTER — Ambulatory Visit
Admission: RE | Admit: 2018-07-30 | Discharge: 2018-07-30 | Disposition: A | Discharge status: Home / Self Care | Payer: Medicare Other | Source: Ambulatory Visit | Attending: Radiation Oncology | Admitting: Radiation Oncology

## 2018-07-30 DIAGNOSIS — Z510 Encounter for antineoplastic radiation therapy: Secondary | ICD-10-CM | POA: Diagnosis not present

## 2018-07-31 ENCOUNTER — Ambulatory Visit
Admission: RE | Admit: 2018-07-31 | Discharge: 2018-07-31 | Disposition: A | Discharge status: Home / Self Care | Payer: Medicare Other | Source: Ambulatory Visit | Attending: Radiation Oncology | Admitting: Radiation Oncology

## 2018-07-31 ENCOUNTER — Other Ambulatory Visit: Payer: Self-pay

## 2018-07-31 DIAGNOSIS — Z510 Encounter for antineoplastic radiation therapy: Secondary | ICD-10-CM | POA: Diagnosis not present

## 2018-08-01 ENCOUNTER — Ambulatory Visit
Admission: RE | Admit: 2018-08-01 | Discharge: 2018-08-01 | Disposition: A | Discharge status: Home / Self Care | Payer: Medicare Other | Source: Ambulatory Visit | Attending: Radiation Oncology | Admitting: Radiation Oncology

## 2018-08-01 ENCOUNTER — Other Ambulatory Visit: Payer: Self-pay

## 2018-08-01 DIAGNOSIS — Z510 Encounter for antineoplastic radiation therapy: Secondary | ICD-10-CM | POA: Diagnosis not present

## 2018-08-02 ENCOUNTER — Other Ambulatory Visit: Payer: Self-pay

## 2018-08-02 ENCOUNTER — Ambulatory Visit
Admission: RE | Admit: 2018-08-02 | Discharge: 2018-08-02 | Disposition: A | Discharge status: Home / Self Care | Payer: Medicare Other | Source: Ambulatory Visit | Attending: Radiation Oncology | Admitting: Radiation Oncology

## 2018-08-02 DIAGNOSIS — Z510 Encounter for antineoplastic radiation therapy: Secondary | ICD-10-CM | POA: Diagnosis not present

## 2018-08-03 ENCOUNTER — Other Ambulatory Visit: Payer: Self-pay

## 2018-08-03 ENCOUNTER — Ambulatory Visit
Admission: RE | Admit: 2018-08-03 | Discharge: 2018-08-03 | Disposition: A | Discharge status: Home / Self Care | Payer: Medicare Other | Source: Ambulatory Visit | Attending: Radiation Oncology | Admitting: Radiation Oncology

## 2018-08-03 DIAGNOSIS — Z510 Encounter for antineoplastic radiation therapy: Secondary | ICD-10-CM | POA: Diagnosis not present

## 2018-08-06 ENCOUNTER — Ambulatory Visit
Admission: RE | Admit: 2018-08-06 | Discharge: 2018-08-06 | Disposition: A | Discharge status: Home / Self Care | Payer: Medicare Other | Source: Ambulatory Visit | Attending: Radiation Oncology | Admitting: Radiation Oncology

## 2018-08-06 ENCOUNTER — Other Ambulatory Visit: Payer: Self-pay

## 2018-08-06 DIAGNOSIS — Z510 Encounter for antineoplastic radiation therapy: Secondary | ICD-10-CM | POA: Diagnosis not present

## 2018-08-07 ENCOUNTER — Ambulatory Visit
Admission: RE | Admit: 2018-08-07 | Discharge: 2018-08-07 | Disposition: A | Discharge status: Home / Self Care | Payer: Medicare Other | Source: Ambulatory Visit | Attending: Radiation Oncology | Admitting: Radiation Oncology

## 2018-08-07 ENCOUNTER — Inpatient Hospital Stay: Payer: Medicare Other | Attending: Radiation Oncology

## 2018-08-07 ENCOUNTER — Other Ambulatory Visit: Payer: Self-pay

## 2018-08-07 DIAGNOSIS — D696 Thrombocytopenia, unspecified: Secondary | ICD-10-CM | POA: Insufficient documentation

## 2018-08-07 DIAGNOSIS — Z510 Encounter for antineoplastic radiation therapy: Secondary | ICD-10-CM | POA: Diagnosis not present

## 2018-08-07 DIAGNOSIS — C61 Malignant neoplasm of prostate: Secondary | ICD-10-CM

## 2018-08-07 LAB — CBC
HCT: 41.6 % (ref 39.0–52.0)
Hemoglobin: 14.1 g/dL (ref 13.0–17.0)
MCH: 30.2 pg (ref 26.0–34.0)
MCHC: 33.9 g/dL (ref 30.0–36.0)
MCV: 89.1 fL (ref 80.0–100.0)
Platelets: 115 10*3/uL — ABNORMAL LOW (ref 150–400)
RBC: 4.67 MIL/uL (ref 4.22–5.81)
RDW: 13.4 % (ref 11.5–15.5)
WBC: 6.1 10*3/uL (ref 4.0–10.5)
nRBC: 0 % (ref 0.0–0.2)

## 2018-08-08 ENCOUNTER — Ambulatory Visit
Admission: RE | Admit: 2018-08-08 | Discharge: 2018-08-08 | Disposition: A | Discharge status: Home / Self Care | Payer: Medicare Other | Source: Ambulatory Visit | Attending: Radiation Oncology | Admitting: Radiation Oncology

## 2018-08-08 ENCOUNTER — Other Ambulatory Visit: Payer: Self-pay

## 2018-08-08 DIAGNOSIS — Z510 Encounter for antineoplastic radiation therapy: Secondary | ICD-10-CM | POA: Diagnosis not present

## 2018-08-09 ENCOUNTER — Other Ambulatory Visit: Payer: Self-pay

## 2018-08-09 ENCOUNTER — Ambulatory Visit
Admission: RE | Admit: 2018-08-09 | Discharge: 2018-08-09 | Disposition: A | Discharge status: Home / Self Care | Payer: Medicare Other | Source: Ambulatory Visit | Attending: Radiation Oncology | Admitting: Radiation Oncology

## 2018-08-09 DIAGNOSIS — Z510 Encounter for antineoplastic radiation therapy: Secondary | ICD-10-CM | POA: Diagnosis not present

## 2018-08-10 ENCOUNTER — Other Ambulatory Visit: Payer: Self-pay

## 2018-08-10 ENCOUNTER — Ambulatory Visit
Admission: RE | Admit: 2018-08-10 | Discharge: 2018-08-10 | Disposition: A | Discharge status: Home / Self Care | Payer: Medicare Other | Source: Ambulatory Visit | Attending: Radiation Oncology | Admitting: Radiation Oncology

## 2018-08-10 DIAGNOSIS — Z510 Encounter for antineoplastic radiation therapy: Secondary | ICD-10-CM | POA: Diagnosis not present

## 2018-08-14 ENCOUNTER — Other Ambulatory Visit: Payer: Self-pay

## 2018-08-14 ENCOUNTER — Ambulatory Visit
Admission: RE | Admit: 2018-08-14 | Discharge: 2018-08-14 | Disposition: A | Discharge status: Home / Self Care | Payer: Medicare Other | Source: Ambulatory Visit | Attending: Radiation Oncology | Admitting: Radiation Oncology

## 2018-08-14 DIAGNOSIS — Z510 Encounter for antineoplastic radiation therapy: Secondary | ICD-10-CM | POA: Diagnosis not present

## 2018-08-15 ENCOUNTER — Other Ambulatory Visit: Payer: Self-pay

## 2018-08-15 ENCOUNTER — Ambulatory Visit
Admission: RE | Admit: 2018-08-15 | Discharge: 2018-08-15 | Disposition: A | Discharge status: Home / Self Care | Payer: Medicare Other | Source: Ambulatory Visit | Attending: Radiation Oncology | Admitting: Radiation Oncology

## 2018-08-15 DIAGNOSIS — Z510 Encounter for antineoplastic radiation therapy: Secondary | ICD-10-CM | POA: Diagnosis not present

## 2018-08-16 ENCOUNTER — Other Ambulatory Visit: Payer: Self-pay

## 2018-08-16 ENCOUNTER — Ambulatory Visit
Admission: RE | Admit: 2018-08-16 | Discharge: 2018-08-16 | Disposition: A | Discharge status: Home / Self Care | Payer: Medicare Other | Source: Ambulatory Visit | Attending: Radiation Oncology | Admitting: Radiation Oncology

## 2018-08-16 DIAGNOSIS — Z510 Encounter for antineoplastic radiation therapy: Secondary | ICD-10-CM | POA: Diagnosis not present

## 2018-08-17 ENCOUNTER — Other Ambulatory Visit: Payer: Self-pay

## 2018-08-17 ENCOUNTER — Ambulatory Visit
Admission: RE | Admit: 2018-08-17 | Discharge: 2018-08-17 | Disposition: A | Discharge status: Home / Self Care | Payer: Medicare Other | Source: Ambulatory Visit | Attending: Radiation Oncology | Admitting: Radiation Oncology

## 2018-08-17 DIAGNOSIS — Z510 Encounter for antineoplastic radiation therapy: Secondary | ICD-10-CM | POA: Diagnosis not present

## 2018-08-20 ENCOUNTER — Other Ambulatory Visit: Payer: Self-pay

## 2018-08-20 ENCOUNTER — Ambulatory Visit
Admission: RE | Admit: 2018-08-20 | Discharge: 2018-08-20 | Disposition: A | Discharge status: Home / Self Care | Payer: Medicare Other | Source: Ambulatory Visit | Attending: Radiation Oncology | Admitting: Radiation Oncology

## 2018-08-20 DIAGNOSIS — I2510 Atherosclerotic heart disease of native coronary artery without angina pectoris: Secondary | ICD-10-CM | POA: Insufficient documentation

## 2018-08-20 DIAGNOSIS — I10 Essential (primary) hypertension: Secondary | ICD-10-CM | POA: Diagnosis not present

## 2018-08-20 DIAGNOSIS — Z794 Long term (current) use of insulin: Secondary | ICD-10-CM | POA: Insufficient documentation

## 2018-08-20 DIAGNOSIS — Z7982 Long term (current) use of aspirin: Secondary | ICD-10-CM | POA: Diagnosis not present

## 2018-08-20 DIAGNOSIS — K219 Gastro-esophageal reflux disease without esophagitis: Secondary | ICD-10-CM | POA: Diagnosis not present

## 2018-08-20 DIAGNOSIS — C61 Malignant neoplasm of prostate: Secondary | ICD-10-CM | POA: Insufficient documentation

## 2018-08-20 DIAGNOSIS — E119 Type 2 diabetes mellitus without complications: Secondary | ICD-10-CM | POA: Insufficient documentation

## 2018-08-20 DIAGNOSIS — N400 Enlarged prostate without lower urinary tract symptoms: Secondary | ICD-10-CM | POA: Diagnosis not present

## 2018-08-20 DIAGNOSIS — G4730 Sleep apnea, unspecified: Secondary | ICD-10-CM | POA: Insufficient documentation

## 2018-08-20 DIAGNOSIS — Z510 Encounter for antineoplastic radiation therapy: Secondary | ICD-10-CM | POA: Insufficient documentation

## 2018-08-20 DIAGNOSIS — Z79899 Other long term (current) drug therapy: Secondary | ICD-10-CM | POA: Insufficient documentation

## 2018-08-21 ENCOUNTER — Inpatient Hospital Stay: Payer: Medicare Other | Attending: Radiation Oncology

## 2018-08-21 ENCOUNTER — Other Ambulatory Visit: Payer: Self-pay

## 2018-08-21 ENCOUNTER — Ambulatory Visit
Admission: RE | Admit: 2018-08-21 | Discharge: 2018-08-21 | Disposition: A | Discharge status: Home / Self Care | Payer: Medicare Other | Source: Ambulatory Visit | Attending: Radiation Oncology | Admitting: Radiation Oncology

## 2018-08-21 DIAGNOSIS — Z510 Encounter for antineoplastic radiation therapy: Secondary | ICD-10-CM | POA: Diagnosis not present

## 2018-08-21 DIAGNOSIS — D696 Thrombocytopenia, unspecified: Secondary | ICD-10-CM | POA: Insufficient documentation

## 2018-08-21 DIAGNOSIS — C61 Malignant neoplasm of prostate: Secondary | ICD-10-CM

## 2018-08-21 LAB — CBC
HCT: 41.7 % (ref 39.0–52.0)
Hemoglobin: 14 g/dL (ref 13.0–17.0)
MCH: 30.1 pg (ref 26.0–34.0)
MCHC: 33.6 g/dL (ref 30.0–36.0)
MCV: 89.7 fL (ref 80.0–100.0)
Platelets: 113 10*3/uL — ABNORMAL LOW (ref 150–400)
RBC: 4.65 MIL/uL (ref 4.22–5.81)
RDW: 13.7 % (ref 11.5–15.5)
WBC: 7.1 10*3/uL (ref 4.0–10.5)
nRBC: 0 % (ref 0.0–0.2)

## 2018-08-22 ENCOUNTER — Other Ambulatory Visit: Payer: Self-pay

## 2018-08-22 ENCOUNTER — Ambulatory Visit
Admission: RE | Admit: 2018-08-22 | Discharge: 2018-08-22 | Disposition: A | Discharge status: Home / Self Care | Payer: Medicare Other | Source: Ambulatory Visit | Attending: Radiation Oncology | Admitting: Radiation Oncology

## 2018-08-22 DIAGNOSIS — Z510 Encounter for antineoplastic radiation therapy: Secondary | ICD-10-CM | POA: Diagnosis not present

## 2018-08-23 ENCOUNTER — Other Ambulatory Visit: Payer: Self-pay

## 2018-08-23 ENCOUNTER — Ambulatory Visit: Payer: Medicare Other

## 2018-08-24 ENCOUNTER — Ambulatory Visit: Payer: Medicare Other

## 2018-08-25 ENCOUNTER — Encounter: Payer: Self-pay | Admitting: *Deleted

## 2018-08-25 ENCOUNTER — Other Ambulatory Visit: Payer: Self-pay

## 2018-08-25 ENCOUNTER — Emergency Department: Payer: Medicare Other

## 2018-08-25 ENCOUNTER — Emergency Department
Admission: EM | Admit: 2018-08-25 | Discharge: 2018-08-25 | Disposition: A | Discharge status: Home / Self Care | Payer: Medicare Other | Attending: Emergency Medicine | Admitting: Emergency Medicine

## 2018-08-25 DIAGNOSIS — I2510 Atherosclerotic heart disease of native coronary artery without angina pectoris: Secondary | ICD-10-CM | POA: Diagnosis not present

## 2018-08-25 DIAGNOSIS — Z79899 Other long term (current) drug therapy: Secondary | ICD-10-CM | POA: Diagnosis not present

## 2018-08-25 DIAGNOSIS — Z87891 Personal history of nicotine dependence: Secondary | ICD-10-CM | POA: Insufficient documentation

## 2018-08-25 DIAGNOSIS — R410 Disorientation, unspecified: Secondary | ICD-10-CM | POA: Insufficient documentation

## 2018-08-25 DIAGNOSIS — I10 Essential (primary) hypertension: Secondary | ICD-10-CM | POA: Diagnosis not present

## 2018-08-25 DIAGNOSIS — E11649 Type 2 diabetes mellitus with hypoglycemia without coma: Secondary | ICD-10-CM | POA: Diagnosis not present

## 2018-08-25 DIAGNOSIS — E162 Hypoglycemia, unspecified: Secondary | ICD-10-CM

## 2018-08-25 LAB — COMPREHENSIVE METABOLIC PANEL
ALT: 25 U/L (ref 0–44)
AST: 30 U/L (ref 15–41)
Albumin: 4.3 g/dL (ref 3.5–5.0)
Alkaline Phosphatase: 92 U/L (ref 38–126)
Anion gap: 9 (ref 5–15)
BUN: 25 mg/dL — ABNORMAL HIGH (ref 8–23)
CO2: 24 mmol/L (ref 22–32)
Calcium: 8.9 mg/dL (ref 8.9–10.3)
Chloride: 105 mmol/L (ref 98–111)
Creatinine, Ser: 0.91 mg/dL (ref 0.61–1.24)
GFR calc Af Amer: >60 mL/min (ref >60)
GFR calc non Af Amer: >60 mL/min (ref >60)
Glucose, Bld: 280 mg/dL — ABNORMAL HIGH (ref 70–99)
Potassium: 3.5 mmol/L (ref 3.5–5.1)
Sodium: 138 mmol/L (ref 135–145)
Total Bilirubin: 0.5 mg/dL (ref 0.3–1.2)
Total Protein: 7.4 g/dL (ref 6.5–8.1)

## 2018-08-25 LAB — CBC WITH DIFFERENTIAL/PLATELET
Abs Immature Granulocytes: 0.03 10*3/uL (ref 0.00–0.07)
Basophils Absolute: 0 10*3/uL (ref 0.0–0.1)
Basophils Relative: 1 %
Eosinophils Absolute: 0.2 10*3/uL (ref 0.0–0.5)
Eosinophils Relative: 2 %
HCT: 44.8 % (ref 39.0–52.0)
Hemoglobin: 15.2 g/dL (ref 13.0–17.0)
Immature Granulocytes: 0 %
Lymphocytes Relative: 10 %
Lymphs Abs: 0.8 10*3/uL (ref 0.7–4.0)
MCH: 30.2 pg (ref 26.0–34.0)
MCHC: 33.9 g/dL (ref 30.0–36.0)
MCV: 89.1 fL (ref 80.0–100.0)
Monocytes Absolute: 0.5 10*3/uL (ref 0.1–1.0)
Monocytes Relative: 6 %
Neutro Abs: 6.6 10*3/uL (ref 1.7–7.7)
Neutrophils Relative %: 81 %
Platelets: 106 10*3/uL — ABNORMAL LOW (ref 150–400)
RBC: 5.03 MIL/uL (ref 4.22–5.81)
RDW: 13.6 % (ref 11.5–15.5)
WBC: 8 10*3/uL (ref 4.0–10.5)
nRBC: 0 % (ref 0.0–0.2)

## 2018-08-25 LAB — URINALYSIS, COMPLETE (UACMP) WITH MICROSCOPIC
Bacteria, UA: NONE SEEN
Bilirubin Urine: NEGATIVE
Glucose, UA: 150 mg/dL — AB
Hgb urine dipstick: NEGATIVE
Ketones, ur: NEGATIVE mg/dL
Leukocytes,Ua: NEGATIVE
Nitrite: NEGATIVE
Protein, ur: 100 mg/dL — AB
Specific Gravity, Urine: 1.014 (ref 1.005–1.030)
pH: 5 (ref 5.0–8.0)

## 2018-08-25 LAB — LIPASE, BLOOD: Lipase: 28 U/L (ref 11–51)

## 2018-08-25 LAB — ACETAMINOPHEN LEVEL: Acetaminophen (Tylenol), Serum: <10 ug/mL — ABNORMAL LOW (ref 10–30)

## 2018-08-25 LAB — SALICYLATE LEVEL: Salicylate Lvl: <7 mg/dL (ref 2.8–30.0)

## 2018-08-25 LAB — ETHANOL: Alcohol, Ethyl (B): <10 mg/dL (ref <10)

## 2018-08-25 LAB — TROPONIN I: Troponin I: <0.03 ng/mL (ref <0.03)

## 2018-08-25 MED ORDER — SODIUM CHLORIDE 0.9 % IV BOLUS
1000.0000 mL | Freq: Once | INTRAVENOUS | Status: AC
  Administered 2018-08-25: 1000 mL via INTRAVENOUS

## 2018-08-25 NOTE — ED Notes (Addendum)
Pt ambulatory outside of room asking when he will be allowed to leave. Pt could tell RN why he was in ED and remembered events from yesterday as well. NAD noted at this time. Pt sitting in bed, heat turned up and MD made aware pt wants to be discharged.   CBG rechecked - 212

## 2018-08-25 NOTE — ED Notes (Signed)
Pt was able to contact his nephew for a ride. Pt requesting to wait outside. Pt escorted to lobby area and waiting for a ride. Pt in NAD and had paperwork in hand.

## 2018-08-25 NOTE — ED Notes (Signed)
RN called and updated pts brother- Zakaria Sedor (contact person).

## 2018-08-25 NOTE — Discharge Instructions (Signed)
You were seen for low blood sugar which happened today and yesterday.  Your tests were all okay in the ED and your blood sugar remained stable.  To keep this from happening again, please decrease your insulin doses by half until you can follow up with your doctor. Continue to eat regularly and check your blood sugar regularly.  Keep a log book of the times that you check your blood sugar and the result, as well as a record of what you eat and when.

## 2018-08-25 NOTE — ED Triage Notes (Addendum)
Pt to ED form home via EMS after family found him unresponsive in his recliner for an unknown amount of time. CBG per EMS was 40. Pts HR was 40 as well with PVCs. Per EMS pt had a similar episode yesterday where he drove onto a curb and had a CBG then of 38. Pt is alert to person place and time but confused with the situation. EMS administered 500cc of 10%Dextrose via IV and repeat CBG was 238 upon arrival to ED.   No changes in medication reported.

## 2018-08-25 NOTE — ED Notes (Signed)
Pt given a meal tray at this time. Pt stated, "if my sugars are good and I can eat why can't I just go home?"

## 2018-08-25 NOTE — ED Notes (Signed)
Attempted to call patients brother for transport. Number went directly to voicemail. Message left that pt was ready for discharge.

## 2018-08-25 NOTE — ED Provider Notes (Signed)
[facility] Emergency Department Provider Note  ____________________________________________  Time seen: Approximately 11:08 PM  I have reviewed the triage vital signs and the nursing notes.   HISTORY  Chief Complaint Hypoglycemia and Altered Mental Status    Level 5 Caveat: Portions of the History and Physical including HPI and review of systems are unable to be completely obtained due to patient being a poor historian   HPI Todd Davidson is a 83 y.o. male with a history of BPH diverticulosis hypertension diabetes  who was found unresponsive in his recliner at home.  EMS came and found his blood sugar was 40.  They gave D10 IV fluid prior to arrival which increased his blood glucose to about 240.  The patient denies any symptoms and states he feels fine.  Notably yesterday the patient was also found confused in his car after having driven up onto the curb at [place].     Past Medical History:  Diagnosis Date  . Arthritis    neck, shoulders, hips, knees  . Asthma   . Barrett esophagus   . BPH (benign prostatic hyperplasia)   . Coronary artery disease    annual stress with Dr. Humphrey Rolls. no new findings  . Depression   . Diabetes mellitus without complication ([place])   . Diverticulosis   . Duodenitis   . Gastritis   . GERD (gastroesophageal reflux disease)   . Hypercholesteremia   . Hypertension   . Kidney stone   . Neuromuscular disorder (HCC)    neuropathy - feet  . Occasional tremors    hands  . Renal insufficiency   . Sleep apnea    doesn't wear CPAP  . Wears dentures    full upper     Patient Active Problem List   Diagnosis Date Noted  . Weight loss 12/30/2017  . Thrombocytopenia ([place]) 12/23/2017  . Impingement syndrome of shoulder region 03/17/2016  . Type II diabetes mellitus with neurological manifestations ([place]) 02/02/2014  . Peripheral polyneuropathy 02/02/2014  . Microalbuminuria 02/02/2014  . Long-term insulin use ([place])  02/02/2014  . Chronic kidney disease, stage III (moderate) ([place]) 08/15/2012  . Uric acid nephrolithiasis 08/14/2012  . Uncertain tumor of kidney and ureter 08/14/2012  . Incomplete emptying of bladder 08/14/2012  . Enlarged prostate with lower urinary tract symptoms (LUTS) 08/14/2012  . Elevated prostate specific antigen (PSA) 08/14/2012  . Acquired cyst of kidney 08/14/2012  . Benign localized hyperplasia of prostate with urinary obstruction and other lower urinary tract symptoms (LUTS)(600.21) 08/14/2012     Past Surgical History:  Procedure Laterality Date  . APPENDECTOMY    . CARDIAC CATHETERIZATION  1999, 2000   stents placed  . CATARACT EXTRACTION W/PHACO Left 10/29/2014   Procedure: CATARACT EXTRACTION PHACO AND INTRAOCULAR LENS PLACEMENT (IOC);  Surgeon: Leandrew Koyanagi, MD;  Location: [place];  Service: Ophthalmology;  Laterality: Left;  DIABETIC - insulin MALYUGIN  . CERVICAL FUSION    . ESOPHAGOGASTRODUODENOSCOPY N/A 12/29/2017   Procedure: ESOPHAGOGASTRODUODENOSCOPY (EGD);  Surgeon: [name], MD;  Location[facility] ENDOSCOPY;  Service: Endoscopy;  Laterality: N/A;  . ESOPHAGOGASTRODUODENOSCOPY (EGD) WITH PROPOFOL N/A 12/17/2015   Procedure: ESOPHAGOGASTRODUODENOSCOPY (EGD) WITH PROPOFOL;  Surgeon: [name], MD;  Location[facility] ENDOSCOPY;  Service: Endoscopy;  Laterality: N/A;  . EYE SURGERY    . HERNIA REPAIR    . JOINT REPLACEMENT     knee  . KNEE SURGERY Right    joint removal with fusion and graft  . NISSEN FUNDOPLICATION  Prior to Admission medications   Medication Sig Start Date End Date Taking? Authorizing Provider  ACCU-CHEK AVIVA PLUS test strip USE 1 STRIP TO CHECK GLUCOSE 4 TIMES DAILY 08/13/17  Yes [provider]  amLODipine (NORVASC) 10 MG tablet Take 10 mg by mouth daily. AM   Yes [provider]  aspirin 325 MG tablet Take 325 mg by mouth daily. AM   Yes [provider]  benazepril  (LOTENSIN) 40 MG tablet  08/28/17  Yes [provider]  citalopram (CELEXA) 10 MG tablet Take 10 mg by mouth daily.  08/18/17  Yes [provider]  finasteride (PROSCAR) 5 MG tablet Take 1 tablet (5 mg total) by mouth daily. 07/04/18  Yes Stoioff, Ronda Fairly, MD  gabapentin (NEURONTIN) 300 MG capsule Take 300 mg by mouth 2 (two) times daily.   Yes [provider]  insulin NPH-regular Human (NOVOLIN 70/30) (70-30) 100 UNIT/ML injection Inject 5 Units into the skin 2 (two) times daily with a meal.    Yes [provider]  magnesium 30 MG tablet Take 30 mg by mouth 2 (two) times daily.   Yes [provider]  magnesium oxide (MAG-OX) 400 MG tablet Take 400 mg by mouth daily.   Yes [provider]  Melatonin 10 MG TABS Take 10 mg by mouth at bedtime.    Yes [provider]  mupirocin ointment (BACTROBAN) 2 % Place 1 application into the nose 2 (two) times daily.   Yes [provider]  omeprazole (PRILOSEC) 40 MG capsule Take 40 mg by mouth daily. PM   Yes [provider]  rosuvastatin (CRESTOR) 40 MG tablet Take 40 mg by mouth daily.  07/14/17  Yes [provider]  saccharomyces boulardii (FLORASTOR) 250 MG capsule Take 250 mg by mouth 2 (two) times daily.   Yes [provider]  tamsulosin (FLOMAX) 0.4 MG CAPS capsule Take 1 capsule (0.4 mg total) by mouth daily after breakfast. 07/04/18  Yes Stoioff, Ronda Fairly, MD  budesonide-formoterol (SYMBICORT) 80-4.5 MCG/ACT inhaler Inhale 2 puffs into the lungs.  12/12/16   [provider]  fexofenadine (ALLEGRA) 180 MG tablet Take by mouth.    [provider]  fluticasone (FLONASE) 50 MCG/ACT nasal spray USE 1 SPRAY(S) IN EACH NOSTRIL AT BEDTIME FOR NASAL CONGESTION BEND HEAD FORWARD 06/08/18   [provider]  ipratropium-albuterol (DUONEB) 0.5-2.5 (3) MG/3ML SOLN Take 3 mLs by nebulization every 6 (six) hours as needed. 05/31/18   Allyne Gee, MD   naproxen sodium (ALEVE) 220 MG tablet Take 220 mg by mouth daily as needed.     [provider]  nitroGLYCERIN (NITROSTAT) 0.4 MG SL tablet Place under the tongue. 06/05/12   [provider]     Allergies Macrolides and ketolides; Erythromycin; Propranolol; Sulfa antibiotics; Sulfasalazine; Tapentadol; Telbivudine; and Tape   Family History  Problem Relation Age of Onset  . Prostate cancer Father   . Heart disease Brother   . Stroke Mother   . Breast cancer Sister     Social History Social History   Tobacco Use  . Smoking status: Former Smoker    Packs/day: 1.00    Years: 20.00    Pack years: 20.00    Types: Cigarettes, Pipe, Cigars    Last attempt to quit: 03/21/1973    Years since quitting: 45.4  . Smokeless tobacco: Current User    Types: Chew  Substance Use Topics  . Alcohol use: Not Currently  . Drug use:  No    Review of Systems Level 5 Caveat: Portions of the History and Physical including HPI and review of systems are unable to be completely obtained due to patient being a poor historian   Constitutional:   No known fever.  ENT:   No rhinorrhea. Cardiovascular:   No chest pain or syncope. Respiratory:   No dyspnea or cough. Gastrointestinal:   Negative for abdominal pain, vomiting and diarrhea.  Musculoskeletal:   Negative for focal pain or swelling ____________________________________________   PHYSICAL EXAM:  VITAL SIGNS: ED Triage Vitals  Enc Vitals Group     BP 08/25/18 1957 (!) 139/92     Pulse Rate 08/25/18 1957 75     Resp 08/25/18 1957 10     Temp --      Temp src --      SpO2 08/25/18 1957 100 %     Weight --      Height 08/25/18 1941 5\' 11"  (1.803 m)     Head Circumference --      Peak Flow --      Pain Score 08/25/18 1941 0     Pain Loc --      Pain Edu? --      Excl. in [place]? --     Vital signs reviewed, nursing assessments reviewed.   Constitutional:   Alert and oriented. Non-toxic appearance. Eyes:    Conjunctivae are normal. EOMI. PERRL. ENT      Head:   Normocephalic and atraumatic.      Nose:   No congestion/rhinnorhea.       Mouth/Throat:   MMM, no pharyngeal erythema. No peritonsillar mass.       Neck:   No meningismus. Full ROM. Hematological/Lymphatic/Immunilogical:   No cervical lymphadenopathy. Cardiovascular:   RRR. Symmetric bilateral radial and DP pulses.  No murmurs. Cap refill less than 2 seconds. Respiratory:   Normal respiratory effort without tachypnea/retractions. Breath sounds are clear and equal bilaterally. No wheezes/rales/rhonchi. Gastrointestinal:   Soft and nontender. Non distended. There is no CVA tenderness.  No rebound, rigidity, or guarding. Genitourinary:   deferred Musculoskeletal:   Normal range of motion in all extremities. No joint effusions.  No lower extremity tenderness.  No edema. Neurologic:   Normal speech and language.  Motor grossly intact. No acute focal neurologic deficits are appreciated.  Skin:    Skin is warm, dry and intact. No rash noted.  No petechiae, purpura, or bullae.  ____________________________________________    LABS (pertinent positives/negatives) (all labs ordered are listed, but only abnormal results are displayed) Labs Reviewed  COMPREHENSIVE METABOLIC PANEL - Abnormal; Notable for the following components:      Result Value   Glucose, Bld 280 (*)    BUN 25 (*)    All other components within normal limits  ACETAMINOPHEN LEVEL - Abnormal; Notable for the following components:   Acetaminophen (Tylenol), Serum <10 (*)    All other components within normal limits  CBC WITH DIFFERENTIAL/PLATELET - Abnormal; Notable for the following components:   Platelets 106 (*)    All other components within normal limits  URINALYSIS, COMPLETE (UACMP) WITH MICROSCOPIC - Abnormal; Notable for the following components:   Color, Urine YELLOW (*)    APPearance CLEAR (*)    Glucose, UA 150 (*)    Protein, ur 100 (*)    All other  components within normal limits  URINE CULTURE  SARS CORONAVIRUS 2 (HOSPITAL ORDER, [facility])  ETHANOL  LIPASE, BLOOD  SALICYLATE LEVEL  TROPONIN I  CBG MONITORING, ED   ____________________________________________   EKG  Interpreted by me Sinus rhythm rate of 81, normal axis and intervals.  Normal QRS ST segments and T waves.  2 premature junctional complexes on the strip, 1 PVC on the strip.  ____________________________________________    RADIOLOGY  Ct Head Wo Contrast  Result Date: 08/25/2018 CLINICAL DATA:  Unresponsive EXAM: CT HEAD WITHOUT CONTRAST TECHNIQUE: Contiguous axial images were obtained from the base of the skull through the vertex without intravenous contrast. COMPARISON:  07/26/2014 FINDINGS: Brain: No evidence of acute infarction, hemorrhage, hydrocephalus, extra-axial collection or mass lesion/mass effect. Mild periventricular white matter hypodensity. Vascular: No hyperdense vessel or unexpected calcification. Skull: Normal. Negative for fracture or focal lesion. Sinuses/Orbits: No acute finding. Status post bilateral maxillary antrostomy. Other: None. IMPRESSION: No acute intracranial pathology.  Small-vessel white matter disease. Electronically Signed   By: Eddie Candle M.D.   On: 08/25/2018 20:55   Dg Chest Portable 1 View  Result Date: 08/25/2018 CLINICAL DATA:  83 year old male found unresponsive. EXAM: PORTABLE CHEST 1 VIEW COMPARISON:  Chest x-ray 12/12/2016. FINDINGS: Lung volumes are normal. No consolidative airspace disease. No pleural effusions. No pneumothorax. No pulmonary nodule or mass noted. Pulmonary vasculature and the cardiomediastinal silhouette are within normal limits. Atherosclerosis in the thoracic aorta. IMPRESSION: 1.  No radiographic evidence of acute cardiopulmonary disease. 2. Aortic atherosclerosis. Electronically Signed   By: Vinnie Langton M.D.   On: 08/25/2018 20:15     ____________________________________________   PROCEDURES Procedures  ____________________________________________  DIFFERENTIAL DIAGNOSIS   Dehydration, electrolyte abnormality, insulin overdose, poor oral intake, UTI, intracranial hemorrhage, stroke, pneumonia  CLINICAL IMPRESSION / ASSESSMENT AND PLAN / ED COURSE  Pertinent labs & imaging results that were available during my care of the patient were reviewed by me and considered in my medical decision making (see chart for details).   Todd Davidson was evaluated in Emergency Department on 08/25/2018 for the symptoms described in the history of present illness. He was evaluated in the context of the global COVID-19 pandemic, which necessitated consideration that the patient might be at risk for infection with the SARS-CoV-2 virus that causes COVID-19. Institutional protocols and algorithms that pertain to the evaluation of patients at risk for COVID-19 are in a state of rapid change based on information released by regulatory bodies including the CDC and federal and state organizations. These policies and algorithms were followed during the patient's care in the ED.   Patient presents with hypoglycemia.  No other acute symptoms.  Exam is nonrevealing.  On arrival to the ED blood sugar is greater than 200.  Check labs CT scan of the head, x-ray of the chest, urinalysis which are all unremarkable.  On reassessment at 11:00 PM, blood sugar still above 240, patient's mental status is normal and he remains asymptomatic and eager to go home.  Advised the patient to decrease his insulin doses by half and keep a meticulous log of fingerstick checks and eating until following up with his doctor in 2 days/as soon as possible.      ____________________________________________   FINAL CLINICAL IMPRESSION(S) / ED DIAGNOSES    Final diagnoses:  Disorientation  Hypoglycemia     ED Discharge Orders    None      Portions of this  note were generated with dragon dictation software. Dictation errors may occur despite best attempts at proofreading.   Carrie Mew, MD 08/25/18 2312

## 2018-08-26 LAB — GLUCOSE, CAPILLARY: Glucose-Capillary: 216 mg/dL — ABNORMAL HIGH (ref 70–99)

## 2018-08-27 ENCOUNTER — Ambulatory Visit
Admission: RE | Admit: 2018-08-27 | Discharge: 2018-08-27 | Disposition: A | Discharge status: Home / Self Care | Payer: Medicare Other | Source: Ambulatory Visit | Attending: Radiation Oncology | Admitting: Radiation Oncology

## 2018-08-27 ENCOUNTER — Other Ambulatory Visit: Payer: Self-pay

## 2018-08-27 DIAGNOSIS — Z510 Encounter for antineoplastic radiation therapy: Secondary | ICD-10-CM | POA: Diagnosis not present

## 2018-08-27 LAB — URINE CULTURE: Culture: <10000 — AB

## 2018-08-28 ENCOUNTER — Ambulatory Visit
Admission: RE | Admit: 2018-08-28 | Discharge: 2018-08-28 | Disposition: A | Discharge status: Home / Self Care | Payer: Medicare Other | Source: Ambulatory Visit | Attending: Radiation Oncology | Admitting: Radiation Oncology

## 2018-08-28 ENCOUNTER — Other Ambulatory Visit: Payer: Self-pay

## 2018-08-28 DIAGNOSIS — Z510 Encounter for antineoplastic radiation therapy: Secondary | ICD-10-CM | POA: Diagnosis not present

## 2018-08-29 ENCOUNTER — Other Ambulatory Visit: Payer: Self-pay

## 2018-08-29 ENCOUNTER — Ambulatory Visit
Admission: RE | Admit: 2018-08-29 | Discharge: 2018-08-29 | Disposition: A | Discharge status: Home / Self Care | Payer: Medicare Other | Source: Ambulatory Visit | Attending: Radiation Oncology | Admitting: Radiation Oncology

## 2018-08-29 DIAGNOSIS — Z510 Encounter for antineoplastic radiation therapy: Secondary | ICD-10-CM | POA: Diagnosis not present

## 2018-08-30 ENCOUNTER — Other Ambulatory Visit: Payer: Self-pay

## 2018-08-30 ENCOUNTER — Ambulatory Visit
Admission: RE | Admit: 2018-08-30 | Discharge: 2018-08-30 | Disposition: A | Discharge status: Home / Self Care | Payer: Medicare Other | Source: Ambulatory Visit | Attending: Radiation Oncology | Admitting: Radiation Oncology

## 2018-08-30 DIAGNOSIS — Z510 Encounter for antineoplastic radiation therapy: Secondary | ICD-10-CM | POA: Diagnosis not present

## 2018-08-31 ENCOUNTER — Other Ambulatory Visit: Payer: Self-pay

## 2018-08-31 ENCOUNTER — Ambulatory Visit
Admission: RE | Admit: 2018-08-31 | Discharge: 2018-08-31 | Disposition: A | Discharge status: Home / Self Care | Payer: Medicare Other | Source: Ambulatory Visit | Attending: Radiation Oncology | Admitting: Radiation Oncology

## 2018-08-31 DIAGNOSIS — Z510 Encounter for antineoplastic radiation therapy: Secondary | ICD-10-CM | POA: Diagnosis not present

## 2018-09-03 ENCOUNTER — Ambulatory Visit
Admission: RE | Admit: 2018-09-03 | Discharge: 2018-09-03 | Disposition: A | Discharge status: Home / Self Care | Payer: Medicare Other | Source: Ambulatory Visit | Attending: Radiation Oncology | Admitting: Radiation Oncology

## 2018-09-03 ENCOUNTER — Other Ambulatory Visit: Payer: Self-pay

## 2018-09-03 DIAGNOSIS — Z510 Encounter for antineoplastic radiation therapy: Secondary | ICD-10-CM | POA: Diagnosis not present

## 2018-09-04 ENCOUNTER — Other Ambulatory Visit: Payer: Self-pay

## 2018-09-04 ENCOUNTER — Ambulatory Visit
Admission: RE | Admit: 2018-09-04 | Discharge: 2018-09-04 | Disposition: A | Discharge status: Home / Self Care | Payer: Medicare Other | Source: Ambulatory Visit | Attending: Radiation Oncology | Admitting: Radiation Oncology

## 2018-09-04 ENCOUNTER — Inpatient Hospital Stay: Payer: Medicare Other

## 2018-09-04 DIAGNOSIS — D696 Thrombocytopenia, unspecified: Secondary | ICD-10-CM | POA: Diagnosis not present

## 2018-09-04 DIAGNOSIS — Z510 Encounter for antineoplastic radiation therapy: Secondary | ICD-10-CM | POA: Diagnosis not present

## 2018-09-04 DIAGNOSIS — C61 Malignant neoplasm of prostate: Secondary | ICD-10-CM

## 2018-09-04 LAB — CBC
HCT: 40.3 % (ref 39.0–52.0)
Hemoglobin: 13.8 g/dL (ref 13.0–17.0)
MCH: 30.4 pg (ref 26.0–34.0)
MCHC: 34.2 g/dL (ref 30.0–36.0)
MCV: 88.8 fL (ref 80.0–100.0)
Platelets: 112 10*3/uL — ABNORMAL LOW (ref 150–400)
RBC: 4.54 MIL/uL (ref 4.22–5.81)
RDW: 13.5 % (ref 11.5–15.5)
WBC: 5.9 10*3/uL (ref 4.0–10.5)
nRBC: 0 % (ref 0.0–0.2)

## 2018-09-05 ENCOUNTER — Ambulatory Visit
Admission: RE | Admit: 2018-09-05 | Discharge: 2018-09-05 | Disposition: A | Discharge status: Home / Self Care | Payer: Medicare Other | Source: Ambulatory Visit | Attending: Radiation Oncology | Admitting: Radiation Oncology

## 2018-09-05 ENCOUNTER — Other Ambulatory Visit: Payer: Self-pay

## 2018-09-05 DIAGNOSIS — Z510 Encounter for antineoplastic radiation therapy: Secondary | ICD-10-CM | POA: Diagnosis not present

## 2018-09-06 ENCOUNTER — Ambulatory Visit
Admission: RE | Admit: 2018-09-06 | Discharge: 2018-09-06 | Disposition: A | Discharge status: Home / Self Care | Payer: Medicare Other | Source: Ambulatory Visit | Attending: Radiation Oncology | Admitting: Radiation Oncology

## 2018-09-06 ENCOUNTER — Other Ambulatory Visit: Payer: Self-pay

## 2018-09-06 DIAGNOSIS — Z510 Encounter for antineoplastic radiation therapy: Secondary | ICD-10-CM | POA: Diagnosis not present

## 2018-09-07 ENCOUNTER — Other Ambulatory Visit: Payer: Self-pay

## 2018-09-07 ENCOUNTER — Ambulatory Visit
Admission: RE | Admit: 2018-09-07 | Discharge: 2018-09-07 | Disposition: A | Discharge status: Home / Self Care | Payer: Medicare Other | Source: Ambulatory Visit | Attending: Radiation Oncology | Admitting: Radiation Oncology

## 2018-09-07 DIAGNOSIS — Z510 Encounter for antineoplastic radiation therapy: Secondary | ICD-10-CM | POA: Diagnosis not present

## 2018-09-10 ENCOUNTER — Ambulatory Visit
Admission: RE | Admit: 2018-09-10 | Discharge: 2018-09-10 | Disposition: A | Discharge status: Home / Self Care | Payer: Medicare Other | Source: Ambulatory Visit | Attending: Radiation Oncology | Admitting: Radiation Oncology

## 2018-09-10 ENCOUNTER — Other Ambulatory Visit: Payer: Self-pay

## 2018-09-10 DIAGNOSIS — Z510 Encounter for antineoplastic radiation therapy: Secondary | ICD-10-CM | POA: Diagnosis not present

## 2018-09-11 ENCOUNTER — Other Ambulatory Visit: Payer: Self-pay

## 2018-09-11 ENCOUNTER — Ambulatory Visit
Admission: RE | Admit: 2018-09-11 | Discharge: 2018-09-11 | Disposition: A | Discharge status: Home / Self Care | Payer: Medicare Other | Source: Ambulatory Visit | Attending: Radiation Oncology | Admitting: Radiation Oncology

## 2018-09-11 DIAGNOSIS — Z510 Encounter for antineoplastic radiation therapy: Secondary | ICD-10-CM | POA: Diagnosis not present

## 2018-09-12 ENCOUNTER — Other Ambulatory Visit: Payer: Self-pay

## 2018-09-12 ENCOUNTER — Ambulatory Visit
Admission: RE | Admit: 2018-09-12 | Discharge: 2018-09-12 | Disposition: A | Discharge status: Home / Self Care | Payer: Medicare Other | Source: Ambulatory Visit | Attending: Radiation Oncology | Admitting: Radiation Oncology

## 2018-09-12 DIAGNOSIS — Z510 Encounter for antineoplastic radiation therapy: Secondary | ICD-10-CM | POA: Diagnosis not present

## 2018-09-13 ENCOUNTER — Other Ambulatory Visit: Payer: Self-pay

## 2018-09-13 ENCOUNTER — Ambulatory Visit
Admission: RE | Admit: 2018-09-13 | Discharge: 2018-09-13 | Disposition: A | Discharge status: Home / Self Care | Payer: Medicare Other | Source: Ambulatory Visit | Attending: Radiation Oncology | Admitting: Radiation Oncology

## 2018-09-13 DIAGNOSIS — Z510 Encounter for antineoplastic radiation therapy: Secondary | ICD-10-CM | POA: Diagnosis not present

## 2018-09-14 ENCOUNTER — Other Ambulatory Visit: Payer: Self-pay

## 2018-09-14 ENCOUNTER — Ambulatory Visit
Admission: RE | Admit: 2018-09-14 | Discharge: 2018-09-14 | Disposition: A | Discharge status: Home / Self Care | Payer: Medicare Other | Source: Ambulatory Visit | Attending: Radiation Oncology | Admitting: Radiation Oncology

## 2018-09-14 DIAGNOSIS — Z510 Encounter for antineoplastic radiation therapy: Secondary | ICD-10-CM | POA: Diagnosis not present

## 2018-09-17 ENCOUNTER — Other Ambulatory Visit: Payer: Self-pay

## 2018-09-17 ENCOUNTER — Ambulatory Visit
Admission: RE | Admit: 2018-09-17 | Discharge: 2018-09-17 | Disposition: A | Discharge status: Home / Self Care | Payer: Medicare Other | Source: Ambulatory Visit | Attending: Radiation Oncology | Admitting: Radiation Oncology

## 2018-09-17 DIAGNOSIS — Z510 Encounter for antineoplastic radiation therapy: Secondary | ICD-10-CM | POA: Diagnosis not present

## 2018-09-18 ENCOUNTER — Ambulatory Visit: Payer: Medicare Other

## 2018-09-18 ENCOUNTER — Other Ambulatory Visit: Payer: Self-pay

## 2018-09-18 ENCOUNTER — Ambulatory Visit
Admission: RE | Admit: 2018-09-18 | Discharge: 2018-09-18 | Disposition: A | Discharge status: Home / Self Care | Payer: Medicare Other | Source: Ambulatory Visit | Attending: Radiation Oncology | Admitting: Radiation Oncology

## 2018-09-18 DIAGNOSIS — Z510 Encounter for antineoplastic radiation therapy: Secondary | ICD-10-CM | POA: Diagnosis not present

## 2018-09-19 ENCOUNTER — Other Ambulatory Visit: Payer: Self-pay

## 2018-09-19 ENCOUNTER — Ambulatory Visit
Admission: RE | Admit: 2018-09-19 | Discharge: 2018-09-19 | Disposition: A | Discharge status: Home / Self Care | Payer: Medicare Other | Source: Ambulatory Visit | Attending: Radiation Oncology | Admitting: Radiation Oncology

## 2018-09-19 ENCOUNTER — Ambulatory Visit: Payer: Medicare Other

## 2018-09-19 DIAGNOSIS — Z79899 Other long term (current) drug therapy: Secondary | ICD-10-CM | POA: Diagnosis not present

## 2018-09-19 DIAGNOSIS — Z7982 Long term (current) use of aspirin: Secondary | ICD-10-CM | POA: Insufficient documentation

## 2018-09-19 DIAGNOSIS — G4730 Sleep apnea, unspecified: Secondary | ICD-10-CM | POA: Insufficient documentation

## 2018-09-19 DIAGNOSIS — K219 Gastro-esophageal reflux disease without esophagitis: Secondary | ICD-10-CM | POA: Diagnosis not present

## 2018-09-19 DIAGNOSIS — Z510 Encounter for antineoplastic radiation therapy: Secondary | ICD-10-CM | POA: Diagnosis present

## 2018-09-19 DIAGNOSIS — C61 Malignant neoplasm of prostate: Secondary | ICD-10-CM | POA: Diagnosis not present

## 2018-09-19 DIAGNOSIS — I10 Essential (primary) hypertension: Secondary | ICD-10-CM | POA: Insufficient documentation

## 2018-09-19 DIAGNOSIS — E119 Type 2 diabetes mellitus without complications: Secondary | ICD-10-CM | POA: Insufficient documentation

## 2018-09-19 DIAGNOSIS — I2510 Atherosclerotic heart disease of native coronary artery without angina pectoris: Secondary | ICD-10-CM | POA: Insufficient documentation

## 2018-09-19 DIAGNOSIS — Z794 Long term (current) use of insulin: Secondary | ICD-10-CM | POA: Insufficient documentation

## 2018-09-19 DIAGNOSIS — N400 Enlarged prostate without lower urinary tract symptoms: Secondary | ICD-10-CM | POA: Diagnosis not present

## 2018-09-20 ENCOUNTER — Ambulatory Visit
Admission: RE | Admit: 2018-09-20 | Discharge: 2018-09-20 | Disposition: A | Discharge status: Home / Self Care | Payer: Medicare Other | Source: Ambulatory Visit | Attending: Radiation Oncology | Admitting: Radiation Oncology

## 2018-09-20 ENCOUNTER — Other Ambulatory Visit: Payer: Self-pay

## 2018-09-20 DIAGNOSIS — Z510 Encounter for antineoplastic radiation therapy: Secondary | ICD-10-CM | POA: Diagnosis not present

## 2018-10-03 ENCOUNTER — Ambulatory Visit: Payer: Self-pay | Admitting: Urology

## 2018-10-18 ENCOUNTER — Encounter: Payer: Self-pay | Admitting: Internal Medicine

## 2018-10-18 ENCOUNTER — Other Ambulatory Visit: Payer: Self-pay

## 2018-10-18 ENCOUNTER — Ambulatory Visit (INDEPENDENT_AMBULATORY_CARE_PROVIDER_SITE_OTHER): Payer: Medicare Other | Admitting: Internal Medicine

## 2018-10-18 VITALS — BP 124/65 | HR 61 | Resp 16 | Ht 71.0 in | Wt 144.0 lb

## 2018-10-18 DIAGNOSIS — J449 Chronic obstructive pulmonary disease, unspecified: Secondary | ICD-10-CM

## 2018-10-18 DIAGNOSIS — C61 Malignant neoplasm of prostate: Secondary | ICD-10-CM | POA: Diagnosis not present

## 2018-10-18 DIAGNOSIS — R0602 Shortness of breath: Secondary | ICD-10-CM | POA: Diagnosis not present

## 2018-10-18 DIAGNOSIS — I25811 Atherosclerosis of native coronary artery of transplanted heart without angina pectoris: Secondary | ICD-10-CM

## 2018-10-18 DIAGNOSIS — N183 Chronic kidney disease, stage 3 unspecified: Secondary | ICD-10-CM

## 2018-10-18 NOTE — Patient Instructions (Signed)
Asthma, Adult ° °Asthma is a long-term (chronic) condition in which the airways get tight and narrow. The airways are the breathing passages that lead from the nose and mouth down into the lungs. A person with asthma will have times when symptoms get worse. These are called asthma attacks. They can cause coughing, whistling sounds when you breathe (wheezing), shortness of breath, and chest pain. They can make it hard to breathe. There is no cure for asthma, but medicines and lifestyle changes can help control it. °There are many things that can bring on an asthma attack or make asthma symptoms worse (triggers). Common triggers include: °· Mold. °· Dust. °· Cigarette smoke. °· Cockroaches. °· Things that can cause allergy symptoms (allergens). These include animal skin flakes (dander) and pollen from trees or grass. °· Things that pollute the air. These may include household cleaners, wood smoke, smog, or chemical odors. °· Cold air, weather changes, and wind. °· Crying or laughing hard. °· Stress. °· Certain medicines or drugs. °· Certain foods such as dried fruit, potato chips, and grape juice. °· Infections, such as a cold or the flu. °· Certain medical conditions or diseases. °· Exercise or tiring activities. °Asthma may be treated with medicines and by staying away from the things that cause asthma attacks. Types of medicines may include: °· Controller medicines. These help prevent asthma symptoms. They are usually taken every day. °· Fast-acting reliever or rescue medicines. These quickly relieve asthma symptoms. They are used as needed and provide short-term relief. °· Allergy medicines if your attacks are brought on by allergens. °· Medicines to help control the body's defense (immune) system. °Follow these instructions at home: °Avoiding triggers in your home °· Change your heating and air conditioning filter often. °· Limit your use of fireplaces and wood stoves. °· Get rid of pests (such as roaches and  mice) and their droppings. °· Throw away plants if you see mold on them. °· Clean your floors. Dust regularly. Use cleaning products that do not smell. °· Have someone vacuum when you are not home. Use a vacuum cleaner with a HEPA filter if possible. °· Replace carpet with wood, tile, or vinyl flooring. Carpet can trap animal skin flakes and dust. °· Use allergy-proof pillows, mattress covers, and box spring covers. °· Wash bed sheets and blankets every week in hot water. Dry them in a dryer. °· Keep your bedroom free of any triggers. °· Avoid pets and keep windows closed when things that cause allergy symptoms are in the air. °· Use blankets that are made of polyester or cotton. °· Clean bathrooms and kitchens with bleach. If possible, have someone repaint the walls in these rooms with mold-resistant paint. Keep out of the rooms that are being cleaned and painted. °· Wash your hands often with soap and water. If soap and water are not available, use hand sanitizer. °· Do not allow anyone to smoke in your home. °General instructions °· Take over-the-counter and prescription medicines only as told by your doctor. °? Talk with your doctor if you have questions about how or when to take your medicines. °? Make note if you need to use your medicines more often than usual. °· Do not use any products that contain nicotine or tobacco, such as cigarettes and e-cigarettes. If you need help quitting, ask your doctor. °· Stay away from secondhand smoke. °· Avoid doing things outdoors when allergen counts are high and when air quality is low. °· Wear a ski mask   when doing outdoor activities in the winter. The mask should cover your nose and mouth. Exercise indoors on cold days if you can. °· Warm up before you exercise. Take time to cool down after exercise. °· Use a peak flow meter as told by your doctor. A peak flow meter is a tool that measures how well the lungs are working. °· Keep track of the peak flow meter's readings.  Write them down. °· Follow your asthma action plan. This is a written plan for taking care of your asthma and treating your attacks. °· Make sure you get all the shots (vaccines) that your doctor recommends. Ask your doctor about a flu shot and a pneumonia shot. °· Keep all follow-up visits as told by your doctor. This is important. °Contact a doctor if: °· You have wheezing, shortness of breath, or a cough even while taking medicine to prevent attacks. °· The mucus you cough up (sputum) is thicker than usual. °· The mucus you cough up changes from clear or white to yellow, green, gray, or bloody. °· You have problems from the medicine you are taking, such as: °? A rash. °? Itching. °? Swelling. °? Trouble breathing. °· You need reliever medicines more than 2-3 times a week. °· Your peak flow reading is still at 50-79% of your personal best after following the action plan for 1 hour. °· You have a fever. °Get help right away if: °· You seem to be worse and are not responding to medicine during an asthma attack. °· You are short of breath even at rest. °· You get short of breath when doing very little activity. °· You have trouble eating, drinking, or talking. °· You have chest pain or tightness. °· You have a fast heartbeat. °· Your lips or fingernails start to turn blue. °· You are light-headed or dizzy, or you faint. °· Your peak flow is less than 50% of your personal best. °· You feel too tired to breathe normally. °Summary °· Asthma is a long-term (chronic) condition in which the airways get tight and narrow. An asthma attack can make it hard to breathe. °· Asthma cannot be cured, but medicines and lifestyle changes can help control it. °· Make sure you understand how to avoid triggers and how and when to use your medicines. °This information is not intended to replace advice given to you by your health care provider. Make sure you discuss any questions you have with your health care provider. °Document  Released: 08/24/2007 Document Revised: 05/10/2018 Document Reviewed: 04/11/2016 °Elsevier Patient Education © 2020 [employer] ° °

## 2018-10-18 NOTE — Progress Notes (Signed)
[facility] [place], [place] [address]  Pulmonary Sleep Medicine   Office Visit Note  Patient Name: Todd Davidson DOB: 05-06-1935 MRN [id number]  Date of Service: 10/18/2018  Complaints/HPI: He states he has been doing about the same. Has been noting some difficulty with his breathing this summer due to the heat. Patient states he is using nebulizer. He is not able to afford inhalers due to cost. He has been getting samples from the clinic. He states it did make a difference with better breathing. Now has no cough. Has been staying indoors. He has no exposure to covid  ROS  General: (-) fever, (-) chills, (-) night sweats, (-) weakness Skin: (-) rashes, (-) itching,. Eyes: (-) visual changes, (-) redness, (-) itching. Nose and Sinuses: (-) nasal stuffiness or itchiness, (-) postnasal drip, (-) nosebleeds, (-) sinus trouble. Mouth and Throat: (-) sore throat, (-) hoarseness. Neck: (-) swollen glands, (-) enlarged thyroid, (-) neck pain. Respiratory: + cough, (-) bloody sputum, + shortness of breath, - wheezing. Cardiovascular: - ankle swelling, (-) chest pain. Lymphatic: (-) lymph node enlargement. Neurologic: (-) numbness, (-) tingling. Psychiatric: (-) anxiety, (-) depression   Current Medication: Outpatient Encounter Medications as of 10/18/2018  Medication Sig  . ACCU-CHEK AVIVA PLUS test strip USE 1 STRIP TO CHECK GLUCOSE 4 TIMES DAILY  . amLODipine (NORVASC) 10 MG tablet Take 10 mg by mouth daily. AM  . aspirin 325 MG tablet Take 325 mg by mouth daily. AM  . benazepril (LOTENSIN) 40 MG tablet   . citalopram (CELEXA) 10 MG tablet Take 10 mg by mouth daily.   . fexofenadine (ALLEGRA) 180 MG tablet Take by mouth.  . finasteride (PROSCAR) 5 MG tablet Take 1 tablet (5 mg total) by mouth daily.  . fluticasone (FLONASE) 50 MCG/ACT nasal spray USE 1 SPRAY(S) IN EACH NOSTRIL AT BEDTIME FOR NASAL CONGESTION BEND HEAD FORWARD  . gabapentin (NEURONTIN) 300  MG capsule Take 300 mg by mouth 2 (two) times daily.  . insulin NPH-regular Human (NOVOLIN 70/30) (70-30) 100 UNIT/ML injection Inject 5 Units into the skin 2 (two) times daily with a meal.   . ipratropium-albuterol (DUONEB) 0.5-2.5 (3) MG/3ML SOLN Take 3 mLs by nebulization every 6 (six) hours as needed.  . magnesium 30 MG tablet Take 30 mg by mouth 2 (two) times daily.  . magnesium oxide (MAG-OX) 400 MG tablet Take 400 mg by mouth daily.  . Melatonin 10 MG TABS Take 10 mg by mouth at bedtime.   . mupirocin ointment (BACTROBAN) 2 % Place 1 application into the nose 2 (two) times daily.  . naproxen sodium (ALEVE) 220 MG tablet Take 220 mg by mouth daily as needed.   . nitroGLYCERIN (NITROSTAT) 0.4 MG SL tablet Place under the tongue.  Marland Kitchen omeprazole (PRILOSEC) 40 MG capsule Take 40 mg by mouth daily. PM  . rosuvastatin (CRESTOR) 40 MG tablet Take 40 mg by mouth daily.   Marland Kitchen saccharomyces boulardii (FLORASTOR) 250 MG capsule Take 250 mg by mouth 2 (two) times daily.  . tamsulosin (FLOMAX) 0.4 MG CAPS capsule Take 1 capsule (0.4 mg total) by mouth daily after breakfast.  . [DISCONTINUED] budesonide-formoterol (SYMBICORT) 80-4.5 MCG/ACT inhaler Inhale 2 puffs into the lungs.    No facility-administered encounter medications on file as of 10/18/2018.     Surgical History: Past Surgical History:  Procedure Laterality Date  . APPENDECTOMY    . CARDIAC CATHETERIZATION  1999, 2000   stents placed  . CATARACT EXTRACTION  W/PHACO Left 10/29/2014   Procedure: CATARACT EXTRACTION PHACO AND INTRAOCULAR LENS PLACEMENT (IOC);  Surgeon: Leandrew Koyanagi, MD;  Location: [facility];  Service: Ophthalmology;  Laterality: Left;  DIABETIC - insulin MALYUGIN  . CERVICAL FUSION    . ESOPHAGOGASTRODUODENOSCOPY N/A 12/29/2017   Procedure: ESOPHAGOGASTRODUODENOSCOPY (EGD);  Surgeon: [name], MD;  Location: [facility] ENDOSCOPY;  Service: Endoscopy;  Laterality: N/A;  . ESOPHAGOGASTRODUODENOSCOPY (EGD)  WITH PROPOFOL N/A 12/17/2015   Procedure: ESOPHAGOGASTRODUODENOSCOPY (EGD) WITH PROPOFOL;  Surgeon: [name], MD;  Location: [facility] ENDOSCOPY;  Service: Endoscopy;  Laterality: N/A;  . EYE SURGERY    . HERNIA REPAIR    . JOINT REPLACEMENT     knee  . KNEE SURGERY Right    joint removal with fusion and graft  . NISSEN FUNDOPLICATION      Medical History: Past Medical History:  Diagnosis Date  . Arthritis    neck, shoulders, hips, knees  . Asthma   . Barrett esophagus   . BPH (benign prostatic hyperplasia)   . Coronary artery disease    annual stress with Dr. Humphrey Rolls. no new findings  . Depression   . Diabetes mellitus without complication ([place])   . Diverticulosis   . Duodenitis   . Gastritis   . GERD (gastroesophageal reflux disease)   . Hypercholesteremia   . Hypertension   . Kidney stone   . Neuromuscular disorder (HCC)    neuropathy - feet  . Occasional tremors    hands  . Renal insufficiency   . Sleep apnea    doesn't wear CPAP  . Wears dentures    full upper    Family History: Family History  Problem Relation Age of Onset  . Prostate cancer Father   . Heart disease Brother   . Stroke Mother   . Breast cancer Sister     Social History: Social History   Socioeconomic History  . Marital status: Married    Spouse name: Not on file  . Number of children: Not on file  . Years of education: Not on file  . Highest education level: Not on file  Occupational History  . Not on file  Social Needs  . Financial resource strain: Not on file  . Food insecurity    Worry: Not on file    Inability: Not on file  . Transportation needs    Medical: Not on file    Non-medical: Not on file  Tobacco Use  . Smoking status: Former Smoker    Packs/day: 1.00    Years: 20.00    Pack years: 20.00    Types: Cigarettes, Pipe, Cigars    Quit date: 03/21/1973    Years since quitting: 45.6  . Smokeless tobacco: Current User    Types: Chew  Substance and Sexual Activity   . Alcohol use: Not Currently  . Drug use: No  . Sexual activity: Yes  Lifestyle  . Physical activity    Days per week: Not on file    Minutes per session: Not on file  . Stress: Not on file  Relationships  . Social Herbalist on phone: Not on file    Gets together: Not on file    Attends religious service: Not on file    Active member of club or organization: Not on file    Attends meetings of clubs or organizations: Not on file    Relationship status: Not on file  . Intimate partner violence    Fear  of current or ex partner: Not on file    Emotionally abused: Not on file    Physically abused: Not on file    Forced sexual activity: Not on file  Other Topics Concern  . Not on file  Social History Narrative  . Not on file    Vital Signs: Blood pressure 124/65, pulse 61, resp. rate 16, height 5\' 11"  (1.803 m), weight 144 lb (65.3 kg), SpO2 96 %.  Examination: General Appearance: The patient is well-developed, well-nourished, and in no distress. Skin: Gross inspection of skin unremarkable. Head: normocephalic, no gross deformities. Eyes: no gross deformities noted. ENT: ears appear grossly normal no exudates. Neck: Supple. No thyromegaly. No LAD. Respiratory: no rhonchi noted at this time. Cardiovascular: Normal S1 and S2 without murmur or rub. Extremities: No cyanosis. pulses are equal. Neurologic: Alert and oriented. No involuntary movements.  LABS: Recent Results (from the past 2160 hour(s))  CBC     Status: Abnormal   Collection Time: 08/07/18 10:43 AM  Result Value Ref Range   WBC 6.1 4.0 - 10.5 K/uL   RBC 4.67 4.22 - 5.81 MIL/uL   Hemoglobin 14.1 13.0 - 17.0 g/dL   HCT 41.6 39.0 - 52.0 %   MCV 89.1 80.0 - 100.0 fL   MCH 30.2 26.0 - 34.0 pg   MCHC 33.9 30.0 - 36.0 g/dL   RDW 13.4 11.5 - 15.5 %   Platelets 115 (L) 150 - 400 K/uL   nRBC 0.0 0.0 - 0.2 %    Comment: Performed at [facility], [address] [place], [place] [address]  CBC      Status: Abnormal   Collection Time: 08/21/18 10:50 AM  Result Value Ref Range   WBC 7.1 4.0 - 10.5 K/uL   RBC 4.65 4.22 - 5.81 MIL/uL   Hemoglobin 14.0 13.0 - 17.0 g/dL   HCT 41.7 39.0 - 52.0 %   MCV 89.7 80.0 - 100.0 fL   MCH 30.1 26.0 - 34.0 pg   MCHC 33.6 30.0 - 36.0 g/dL   RDW 13.7 11.5 - 15.5 %   Platelets 113 (L) 150 - 400 K/uL   nRBC 0.0 0.0 - 0.2 %    Comment: Performed at [facility], [address] [place], [place] [address]  Comprehensive metabolic panel     Status: Abnormal   Collection Time: 08/25/18  7:51 PM  Result Value Ref Range   Sodium 138 135 - 145 mmol/L   Potassium 3.5 3.5 - 5.1 mmol/L   Chloride 105 98 - 111 mmol/L   CO2 24 22 - 32 mmol/L   Glucose, Bld 280 (H) 70 - 99 mg/dL   BUN 25 (H) 8 - 23 mg/dL   Creatinine, Ser 0.91 0.61 - 1.24 mg/dL   Calcium 8.9 8.9 - 10.3 mg/dL   Total Protein 7.4 6.5 - 8.1 g/dL   Albumin 4.3 3.5 - 5.0 g/dL   AST 30 15 - 41 U/L   ALT 25 0 - 44 U/L   Alkaline Phosphatase 92 38 - 126 U/L   Total Bilirubin 0.5 0.3 - 1.2 mg/dL   GFR calc non Af Amer >60 >60 mL/min   GFR calc Af Amer >60 >60 mL/min   Anion gap 9 5 - 15    Comment: Performed at [facility], [address] [place], [place] [address]  Ethanol     Status: None   Collection Time: 08/25/18  7:51 PM  Result Value Ref Range  Alcohol, Ethyl (B) <10 <10 mg/dL    Comment: (NOTE) Lowest detectable limit for serum alcohol is 10 mg/dL. For medical purposes only. Performed at [facility], [address] [place], [place] [address]   Lipase, blood     Status: None   Collection Time: 08/25/18  7:51 PM  Result Value Ref Range   Lipase 28 11 - 51 U/L    Comment: Performed at [facility], [address] [place], [place] [address]  Acetaminophen level     Status: Abnormal   Collection Time: 08/25/18  7:51 PM  Result Value Ref Range   Acetaminophen (Tylenol), Serum <10 (L) 10 - 30 ug/mL    Comment: (NOTE) Therapeutic  concentrations vary significantly. A range of 10-30 ug/mL  may be an effective concentration for many patients. However, some  are best treated at concentrations outside of this range. Acetaminophen concentrations >150 ug/mL at 4 hours after ingestion  and >50 ug/mL at 12 hours after ingestion are often associated with  toxic reactions. Performed at [facility], [address] [place], [place] [address]   Salicylate level     Status: None   Collection Time: 08/25/18  7:51 PM  Result Value Ref Range   Salicylate Lvl <1.0 2.8 - 30.0 mg/dL    Comment: Performed at [facility], [address] [place], [place] [address]  Troponin I - Once     Status: None   Collection Time: 08/25/18  7:51 PM  Result Value Ref Range   Troponin I <0.03 <0.03 ng/mL    Comment: Performed at [facility], [address] [place], [place] [address]  CBC with Differential     Status: Abnormal   Collection Time: 08/25/18  7:51 PM  Result Value Ref Range   WBC 8.0 4.0 - 10.5 K/uL   RBC 5.03 4.22 - 5.81 MIL/uL   Hemoglobin 15.2 13.0 - 17.0 g/dL   HCT 44.8 39.0 - 52.0 %   MCV 89.1 80.0 - 100.0 fL   MCH 30.2 26.0 - 34.0 pg   MCHC 33.9 30.0 - 36.0 g/dL   RDW 13.6 11.5 - 15.5 %   Platelets 106 (L) 150 - 400 K/uL    Comment: Immature Platelet Fraction may be clinically indicated, consider ordering this additional test [id number]    nRBC 0.0 0.0 - 0.2 %   Neutrophils Relative % 81 %   Neutro Abs 6.6 1.7 - 7.7 K/uL   Lymphocytes Relative 10 %   Lymphs Abs 0.8 0.7 - 4.0 K/uL   Monocytes Relative 6 %   Monocytes Absolute 0.5 0.1 - 1.0 K/uL   Eosinophils Relative 2 %   Eosinophils Absolute 0.2 0.0 - 0.5 K/uL   Basophils Relative 1 %   Basophils Absolute 0.0 0.0 - 0.1 K/uL   Immature Granulocytes 0 %   Abs Immature Granulocytes 0.03 0.00 - 0.07 K/uL    Comment: Performed at [facility], [address] [place], [place] [address]  Urinalysis, Complete w Microscopic      Status: Abnormal   Collection Time: 08/25/18  7:51 PM  Result Value Ref Range   Color, Urine YELLOW (A) YELLOW   APPearance CLEAR (A) CLEAR   Specific Gravity, Urine 1.014 1.005 - 1.030   pH 5.0 5.0 - 8.0   Glucose, UA 150 (A) NEGATIVE mg/dL   Hgb urine dipstick NEGATIVE NEGATIVE   Bilirubin Urine NEGATIVE NEGATIVE   Ketones, ur NEGATIVE NEGATIVE mg/dL   Protein, ur  100 (A) NEGATIVE mg/dL   Nitrite NEGATIVE NEGATIVE   Leukocytes,Ua NEGATIVE NEGATIVE   RBC / HPF 0-5 0 - 5 RBC/hpf   WBC, UA 0-5 0 - 5 WBC/hpf   Bacteria, UA NONE SEEN NONE SEEN   Squamous Epithelial / LPF 0-5 0 - 5   Mucus PRESENT     Comment: Performed at [facility], [address], [place], [place] [address]  Urine culture     Status: Abnormal   Collection Time: 08/25/18  7:51 PM   Specimen: Urine, Random  Result Value Ref Range   Specimen Description      URINE, RANDOM Performed at [facility], [address], [place], [place] [address]    Special Requests      NONE Performed at [facility], [address], [place], [place] [address]    Culture (A)     <10,000 COLONIES/mL INSIGNIFICANT GROWTH Performed at [facility], [place] [address] [place], [place] [address]    Report Status 08/27/2018 FINAL   Glucose, capillary     Status: Abnormal   Collection Time: 08/25/18 10:22 PM  Result Value Ref Range   Glucose-Capillary 216 (H) 70 - 99 mg/dL  CBC     Status: Abnormal   Collection Time: 09/04/18 10:44 AM  Result Value Ref Range   WBC 5.9 4.0 - 10.5 K/uL   RBC 4.54 4.22 - 5.81 MIL/uL   Hemoglobin 13.8 13.0 - 17.0 g/dL   HCT 40.3 39.0 - 52.0 %   MCV 88.8 80.0 - 100.0 fL   MCH 30.4 26.0 - 34.0 pg   MCHC 34.2 30.0 - 36.0 g/dL   RDW 13.5 11.5 - 15.5 %   Platelets 112 (L) 150 - 400 K/uL   nRBC 0.0 0.0 - 0.2 %    Comment: Performed at [facility], [address], [place], [place] [address]    Radiology: No results found.  No results found.  No results  found.    Assessment and Plan: Patient Active Problem List   Diagnosis Date Noted  . Weight loss 12/30/2017  . Thrombocytopenia ([place]) 12/23/2017  . Impingement syndrome of shoulder region 03/17/2016  . Type II diabetes mellitus with neurological manifestations ([place]) 02/02/2014  . Peripheral polyneuropathy 02/02/2014  . Microalbuminuria 02/02/2014  . Long-term insulin use ([place]) 02/02/2014  . Chronic kidney disease, stage III (moderate) ([place]) 08/15/2012  . Uric acid nephrolithiasis 08/14/2012  . Uncertain tumor of kidney and ureter 08/14/2012  . Incomplete emptying of bladder 08/14/2012  . Enlarged prostate with lower urinary tract symptoms (LUTS) 08/14/2012  . Elevated prostate specific antigen (PSA) 08/14/2012  . Acquired cyst of kidney 08/14/2012  . Benign localized hyperplasia of prostate with urinary obstruction and other lower urinary tract symptoms (LUTS)(600.21) 08/14/2012    1. Chronic Obstructive Asthma. He had normal PFT in February. Still with symptoms needs to be on laba and steroids. We had also given samples of trelegy which he seems to gain benefit from best 2. CKD III will be followed by his primary team.  3. Prostate cancer has been seen by oncology will follow up 4. CAD has been stable will monitor with his cardiologist  General Counseling: I have discussed the findings of the evaluation and examination with Jeani Hawking.  I have also discussed any further diagnostic evaluation thatmay be needed or ordered today. Azir verbalizes understanding of the findings of todays visit. We also reviewed his medications today and discussed drug interactions and side effects  including but not limited excessive drowsiness and altered mental states. We also discussed that there is always a risk not just to him but also people around him. he has been encouraged to call the office with any questions or concerns that should arise related to todays visit.    Time spent: 68min  I have  personally obtained a history, examined the patient, evaluated laboratory and imaging results, formulated the assessment and plan and placed orders.    Allyne Gee, MD [facility] Pulmonary and Critical Care Sleep medicine

## 2018-10-25 ENCOUNTER — Other Ambulatory Visit: Payer: Self-pay

## 2018-10-26 ENCOUNTER — Ambulatory Visit
Admission: RE | Admit: 2018-10-26 | Discharge: 2018-10-26 | Disposition: A | Discharge status: Home / Self Care | Payer: Medicare Other | Source: Ambulatory Visit | Attending: Radiation Oncology | Admitting: Radiation Oncology

## 2018-10-26 ENCOUNTER — Encounter: Payer: Self-pay | Admitting: Radiation Oncology

## 2018-10-26 ENCOUNTER — Other Ambulatory Visit: Payer: Self-pay

## 2018-10-26 VITALS — BP 134/68 | HR 60 | Temp 97.6°F | Resp 18 | Wt 143.1 lb

## 2018-10-26 DIAGNOSIS — C61 Malignant neoplasm of prostate: Secondary | ICD-10-CM | POA: Insufficient documentation

## 2018-10-26 DIAGNOSIS — Z923 Personal history of irradiation: Secondary | ICD-10-CM | POA: Diagnosis not present

## 2018-10-26 NOTE — Progress Notes (Signed)
Radiation Oncology Follow up Note  Name: Todd Davidson   Date:   10/26/2018 MRN:  [id number] DOB: 22-Sep-1935    This 83 y.o. 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  REFERRING PROVIDER: Perrin Maltese, MD  HPI: Patient is an 83 year old male now out 1 month having completed IMRT radiation therapy for Gleason 7 (3+4) adenocarcinoma the prostate presenting with a PSA of 13.6.  Seen today in routine follow-up he is doing well he specifically denies diarrhea dysuria or any other GI/GU complaints..  COMPLICATIONS OF TREATMENT: none  FOLLOW UP COMPLIANCE: keeps appointments   PHYSICAL EXAM:  BP 134/68 (BP Location: Left Arm, Patient Position: Sitting)   Pulse 60   Temp 97.6 F (36.4 C)   Resp 18   Wt 143 lb 1.6 oz (64.9 kg)   BMI 19.96 kg/m  Well-developed well-nourished patient in NAD. HEENT reveals PERLA, EOMI, discs not visualized.  Oral cavity is clear. No oral mucosal lesions are identified. Neck is clear without evidence of cervical or supraclavicular adenopathy. Lungs are clear to A&P. Cardiac examination is essentially unremarkable with regular rate and rhythm without murmur rub or thrill. Abdomen is benign with no organomegaly or masses noted. Motor sensory and DTR levels are equal and symmetric in the upper and lower extremities. Cranial nerves II through XII are grossly intact. Proprioception is intact. No peripheral adenopathy or edema is identified. No motor or sensory levels are noted. Crude visual fields are within normal range.  RADIOLOGY RESULTS: No current films to review  PLAN: Present time patient is doing well with very low side effect profile from his prostate radiation therapy.  I am pleased with his overall progress.  I have asked to see him back in 3 months with a PSA prior to that visit.  Patient knows to call sooner with any  concerns.  I would like to take this opportunity to thank you for allowing me to participate in the care of your patient.Noreene Filbert, MD

## 2018-10-31 ENCOUNTER — Emergency Department (HOSPITAL_COMMUNITY): Payer: Medicare Other

## 2018-10-31 ENCOUNTER — Inpatient Hospital Stay (HOSPITAL_COMMUNITY): Payer: Medicare Other | Admitting: Certified Registered Nurse Anesthetist

## 2018-10-31 ENCOUNTER — Inpatient Hospital Stay (HOSPITAL_COMMUNITY)
Admission: EM | Admit: 2018-10-31 | Discharge: 2018-11-06 | DRG: 481 | Disposition: A | Discharge status: Skilled Nursing Facility | Payer: Medicare Other | Attending: General Surgery | Admitting: General Surgery

## 2018-10-31 ENCOUNTER — Inpatient Hospital Stay (HOSPITAL_COMMUNITY): Payer: Medicare Other

## 2018-10-31 ENCOUNTER — Encounter (HOSPITAL_COMMUNITY): Admission: EM | Disposition: A | Discharge status: Skilled Nursing Facility | Payer: Self-pay | Source: Home / Self Care

## 2018-10-31 ENCOUNTER — Encounter (HOSPITAL_COMMUNITY): Payer: Self-pay | Admitting: Student

## 2018-10-31 DIAGNOSIS — S32048A Other fracture of fourth lumbar vertebra, initial encounter for closed fracture: Secondary | ICD-10-CM | POA: Diagnosis present

## 2018-10-31 DIAGNOSIS — D62 Acute posthemorrhagic anemia: Secondary | ICD-10-CM | POA: Diagnosis not present

## 2018-10-31 DIAGNOSIS — Y92410 Unspecified street and highway as the place of occurrence of the external cause: Secondary | ICD-10-CM

## 2018-10-31 DIAGNOSIS — K2270 Barrett's esophagus without dysplasia: Secondary | ICD-10-CM | POA: Diagnosis present

## 2018-10-31 DIAGNOSIS — S2239XA Fracture of one rib, unspecified side, initial encounter for closed fracture: Secondary | ICD-10-CM

## 2018-10-31 DIAGNOSIS — Z8673 Personal history of transient ischemic attack (TIA), and cerebral infarction without residual deficits: Secondary | ICD-10-CM

## 2018-10-31 DIAGNOSIS — S2222XA Fracture of body of sternum, initial encounter for closed fracture: Secondary | ICD-10-CM | POA: Diagnosis not present

## 2018-10-31 DIAGNOSIS — I10 Essential (primary) hypertension: Secondary | ICD-10-CM | POA: Diagnosis present

## 2018-10-31 DIAGNOSIS — S2241XA Multiple fractures of ribs, right side, initial encounter for closed fracture: Secondary | ICD-10-CM | POA: Diagnosis not present

## 2018-10-31 DIAGNOSIS — Z955 Presence of coronary angioplasty implant and graft: Secondary | ICD-10-CM | POA: Diagnosis not present

## 2018-10-31 DIAGNOSIS — Z23 Encounter for immunization: Secondary | ICD-10-CM

## 2018-10-31 DIAGNOSIS — M79661 Pain in right lower leg: Secondary | ICD-10-CM | POA: Diagnosis present

## 2018-10-31 DIAGNOSIS — N400 Enlarged prostate without lower urinary tract symptoms: Secondary | ICD-10-CM | POA: Diagnosis present

## 2018-10-31 DIAGNOSIS — S32000A Wedge compression fracture of unspecified lumbar vertebra, initial encounter for closed fracture: Secondary | ICD-10-CM

## 2018-10-31 DIAGNOSIS — J45909 Unspecified asthma, uncomplicated: Secondary | ICD-10-CM | POA: Diagnosis present

## 2018-10-31 DIAGNOSIS — E1142 Type 2 diabetes mellitus with diabetic polyneuropathy: Secondary | ICD-10-CM | POA: Diagnosis present

## 2018-10-31 DIAGNOSIS — G8929 Other chronic pain: Secondary | ICD-10-CM | POA: Diagnosis present

## 2018-10-31 DIAGNOSIS — Z7951 Long term (current) use of inhaled steroids: Secondary | ICD-10-CM

## 2018-10-31 DIAGNOSIS — Z79899 Other long term (current) drug therapy: Secondary | ICD-10-CM | POA: Diagnosis not present

## 2018-10-31 DIAGNOSIS — S81011A Laceration without foreign body, right knee, initial encounter: Secondary | ICD-10-CM | POA: Diagnosis present

## 2018-10-31 DIAGNOSIS — S82142A Displaced bicondylar fracture of left tibia, initial encounter for closed fracture: Secondary | ICD-10-CM

## 2018-10-31 DIAGNOSIS — M9711XA Periprosthetic fracture around internal prosthetic right knee joint, initial encounter: Secondary | ICD-10-CM | POA: Diagnosis not present

## 2018-10-31 DIAGNOSIS — C642 Malignant neoplasm of left kidney, except renal pelvis: Secondary | ICD-10-CM | POA: Diagnosis present

## 2018-10-31 DIAGNOSIS — N2889 Other specified disorders of kidney and ureter: Secondary | ICD-10-CM

## 2018-10-31 DIAGNOSIS — E1140 Type 2 diabetes mellitus with diabetic neuropathy, unspecified: Secondary | ICD-10-CM | POA: Diagnosis present

## 2018-10-31 DIAGNOSIS — K219 Gastro-esophageal reflux disease without esophagitis: Secondary | ICD-10-CM | POA: Diagnosis present

## 2018-10-31 DIAGNOSIS — Z20828 Contact with and (suspected) exposure to other viral communicable diseases: Secondary | ICD-10-CM

## 2018-10-31 DIAGNOSIS — I2510 Atherosclerotic heart disease of native coronary artery without angina pectoris: Secondary | ICD-10-CM | POA: Diagnosis present

## 2018-10-31 DIAGNOSIS — S32018A Other fracture of first lumbar vertebra, initial encounter for closed fracture: Secondary | ICD-10-CM | POA: Diagnosis not present

## 2018-10-31 DIAGNOSIS — Z419 Encounter for procedure for purposes other than remedying health state, unspecified: Secondary | ICD-10-CM

## 2018-10-31 DIAGNOSIS — S2249XA Multiple fractures of ribs, unspecified side, initial encounter for closed fracture: Secondary | ICD-10-CM

## 2018-10-31 DIAGNOSIS — S72431C Displaced fracture of medial condyle of right femur, initial encounter for open fracture type IIIA, IIIB, or IIIC: Principal | ICD-10-CM | POA: Diagnosis present

## 2018-10-31 DIAGNOSIS — T1490XA Injury, unspecified, initial encounter: Secondary | ICD-10-CM

## 2018-10-31 DIAGNOSIS — K5900 Constipation, unspecified: Secondary | ICD-10-CM | POA: Diagnosis present

## 2018-10-31 HISTORY — PX: IRRIGATION AND DEBRIDEMENT KNEE: SHX5185

## 2018-10-31 HISTORY — PX: I & D EXTREMITY: SHX5045

## 2018-10-31 LAB — GLUCOSE, CAPILLARY
Glucose-Capillary: 154 mg/dL — ABNORMAL HIGH (ref 70–99)
Glucose-Capillary: 169 mg/dL — ABNORMAL HIGH (ref 70–99)
Glucose-Capillary: 184 mg/dL — ABNORMAL HIGH (ref 70–99)
Glucose-Capillary: 216 mg/dL — ABNORMAL HIGH (ref 70–99)

## 2018-10-31 LAB — PREPARE FRESH FROZEN PLASMA: Unit division: 0

## 2018-10-31 LAB — TYPE AND SCREEN
ABO/RH(D): A NEG
Antibody Screen: NEGATIVE
Unit division: 0
Unit division: 0

## 2018-10-31 LAB — CBC
HCT: 38.3 % — ABNORMAL LOW (ref 39.0–52.0)
Hemoglobin: 12.8 g/dL — ABNORMAL LOW (ref 13.0–17.0)
MCH: 30.4 pg (ref 26.0–34.0)
MCHC: 33.4 g/dL (ref 30.0–36.0)
MCV: 91 fL (ref 80.0–100.0)
Platelets: 103 10*3/uL — ABNORMAL LOW (ref 150–400)
RBC: 4.21 MIL/uL — ABNORMAL LOW (ref 4.22–5.81)
RDW: 13.2 % (ref 11.5–15.5)
WBC: 10.2 10*3/uL (ref 4.0–10.5)
nRBC: 0 % (ref 0.0–0.2)

## 2018-10-31 LAB — BPAM FFP
Blood Product Expiration Date: 202008162359
Blood Product Expiration Date: 202008162359
ISSUE DATE / TIME: 202008120915
ISSUE DATE / TIME: 202008120915
Unit Type and Rh: 6200
Unit Type and Rh: 6200

## 2018-10-31 LAB — COMPREHENSIVE METABOLIC PANEL
ALT: 69 U/L — ABNORMAL HIGH (ref 0–44)
AST: 88 U/L — ABNORMAL HIGH (ref 15–41)
Albumin: 3.5 g/dL (ref 3.5–5.0)
Alkaline Phosphatase: 67 U/L (ref 38–126)
Anion gap: 9 (ref 5–15)
BUN: 25 mg/dL — ABNORMAL HIGH (ref 8–23)
CO2: 24 mmol/L (ref 22–32)
Calcium: 8.8 mg/dL — ABNORMAL LOW (ref 8.9–10.3)
Chloride: 107 mmol/L (ref 98–111)
Creatinine, Ser: 1.02 mg/dL (ref 0.61–1.24)
GFR calc Af Amer: >60 mL/min (ref >60)
GFR calc non Af Amer: >60 mL/min (ref >60)
Glucose, Bld: 179 mg/dL — ABNORMAL HIGH (ref 70–99)
Potassium: 4.4 mmol/L (ref 3.5–5.1)
Sodium: 140 mmol/L (ref 135–145)
Total Bilirubin: 0.5 mg/dL (ref 0.3–1.2)
Total Protein: 5.8 g/dL — ABNORMAL LOW (ref 6.5–8.1)

## 2018-10-31 LAB — I-STAT CHEM 8, ED
BUN: 26 mg/dL — ABNORMAL HIGH (ref 8–23)
Calcium, Ion: 1.13 mmol/L — ABNORMAL LOW (ref 1.15–1.40)
Chloride: 107 mmol/L (ref 98–111)
Creatinine, Ser: 0.9 mg/dL (ref 0.61–1.24)
Glucose, Bld: 173 mg/dL — ABNORMAL HIGH (ref 70–99)
HCT: 38 % — ABNORMAL LOW (ref 39.0–52.0)
Hemoglobin: 12.9 g/dL — ABNORMAL LOW (ref 13.0–17.0)
Potassium: 4.4 mmol/L (ref 3.5–5.1)
Sodium: 140 mmol/L (ref 135–145)
TCO2: 23 mmol/L (ref 22–32)

## 2018-10-31 LAB — CDS SEROLOGY

## 2018-10-31 LAB — BLOOD PRODUCT ORDER (VERBAL) VERIFICATION

## 2018-10-31 LAB — HEMOGLOBIN A1C
Hgb A1c MFr Bld: 6.8 % — ABNORMAL HIGH (ref 4.8–5.6)
Mean Plasma Glucose: 148.46 mg/dL

## 2018-10-31 LAB — PROTIME-INR
INR: 1 (ref 0.8–1.2)
Prothrombin Time: 13.4 seconds (ref 11.4–15.2)

## 2018-10-31 LAB — ETHANOL: Alcohol, Ethyl (B): <10 mg/dL (ref <10)

## 2018-10-31 LAB — SARS CORONAVIRUS 2 BY RT PCR (HOSPITAL ORDER, PERFORMED IN ~~LOC~~ HOSPITAL LAB): SARS Coronavirus 2: NEGATIVE

## 2018-10-31 LAB — BPAM RBC
Blood Product Expiration Date: 202009092359
Blood Product Expiration Date: 202009092359
ISSUE DATE / TIME: 202008120915
ISSUE DATE / TIME: 202008120915
Unit Type and Rh: 5100
Unit Type and Rh: 5100

## 2018-10-31 LAB — URINALYSIS, ROUTINE W REFLEX MICROSCOPIC
Bacteria, UA: NONE SEEN
Bilirubin Urine: NEGATIVE
Glucose, UA: NEGATIVE mg/dL
Ketones, ur: 5 mg/dL — AB
Leukocytes,Ua: NEGATIVE
Nitrite: NEGATIVE
Protein, ur: 100 mg/dL — AB
Specific Gravity, Urine: 1.044 — ABNORMAL HIGH (ref 1.005–1.030)
pH: 5 (ref 5.0–8.0)

## 2018-10-31 LAB — LACTIC ACID, PLASMA: Lactic Acid, Venous: 1.8 mmol/L (ref 0.5–1.9)

## 2018-10-31 LAB — ABO/RH: ABO/RH(D): A NEG

## 2018-10-31 SURGERY — IRRIGATION AND DEBRIDEMENT EXTREMITY
Anesthesia: General | Laterality: Right

## 2018-10-31 MED ORDER — ONDANSETRON HCL 4 MG/2ML IJ SOLN
INTRAMUSCULAR | Status: DC | PRN
  Administered 2018-10-31: 4 mg via INTRAVENOUS

## 2018-10-31 MED ORDER — VANCOMYCIN HCL 1000 MG IV SOLR
INTRAVENOUS | Status: DC | PRN
  Administered 2018-10-31: 1000 mg via TOPICAL

## 2018-10-31 MED ORDER — ACETAMINOPHEN 325 MG PO TABS
650.0000 mg | ORAL_TABLET | Freq: Four times a day (QID) | ORAL | Status: DC
  Administered 2018-10-31 – 2018-11-06 (×23): 650 mg via ORAL
  Filled 2018-10-31 (×24): qty 2

## 2018-10-31 MED ORDER — FENTANYL CITRATE (PF) 100 MCG/2ML IJ SOLN
INTRAMUSCULAR | Status: AC
  Filled 2018-10-31: qty 2

## 2018-10-31 MED ORDER — FENTANYL CITRATE (PF) 100 MCG/2ML IJ SOLN
50.0000 ug | Freq: Once | INTRAMUSCULAR | Status: AC
  Administered 2018-10-31: 50 ug via INTRAVENOUS
  Filled 2018-10-31: qty 2

## 2018-10-31 MED ORDER — LACTATED RINGERS IV SOLN
INTRAVENOUS | Status: DC | PRN
  Administered 2018-10-31: 13:00:00 via INTRAVENOUS

## 2018-10-31 MED ORDER — PANTOPRAZOLE SODIUM 40 MG IV SOLR: 40.0000 mg | Freq: Every day | INTRAVENOUS | Status: DC

## 2018-10-31 MED ORDER — ONDANSETRON 4 MG PO TBDP: 4.0000 mg | ORAL_TABLET | Freq: Four times a day (QID) | ORAL | Status: DC | PRN

## 2018-10-31 MED ORDER — FENTANYL CITRATE (PF) 100 MCG/2ML IJ SOLN
25.0000 ug | INTRAMUSCULAR | Status: DC | PRN
  Administered 2018-10-31 (×2): 25 ug via INTRAVENOUS

## 2018-10-31 MED ORDER — ROCURONIUM BROMIDE 100 MG/10ML IV SOLN
INTRAVENOUS | Status: DC | PRN
  Administered 2018-10-31 (×2): 10 mg via INTRAVENOUS
  Administered 2018-10-31: 30 mg via INTRAVENOUS

## 2018-10-31 MED ORDER — 0.9 % SODIUM CHLORIDE (POUR BTL) OPTIME
TOPICAL | Status: DC | PRN
  Administered 2018-10-31: 15:00:00 1000 mL

## 2018-10-31 MED ORDER — TETANUS-DIPHTH-ACELL PERTUSSIS 5-2.5-18.5 LF-MCG/0.5 IM SUSP
0.5000 mL | Freq: Once | INTRAMUSCULAR | Status: AC
  Administered 2018-10-31: 0.5 mL via INTRAMUSCULAR
  Filled 2018-10-31: qty 0.5

## 2018-10-31 MED ORDER — DEXAMETHASONE SODIUM PHOSPHATE 10 MG/ML IJ SOLN
INTRAMUSCULAR | Status: DC | PRN
  Administered 2018-10-31: 10 mg via INTRAVENOUS

## 2018-10-31 MED ORDER — LIDOCAINE 2% (20 MG/ML) 5 ML SYRINGE
INTRAMUSCULAR | Status: DC | PRN
  Administered 2018-10-31: 80 mg via INTRAVENOUS

## 2018-10-31 MED ORDER — SUCCINYLCHOLINE CHLORIDE 200 MG/10ML IV SOSY
PREFILLED_SYRINGE | INTRAVENOUS | Status: DC | PRN
  Administered 2018-10-31: 100 mg via INTRAVENOUS

## 2018-10-31 MED ORDER — FENTANYL CITRATE (PF) 250 MCG/5ML IJ SOLN
INTRAMUSCULAR | Status: AC
  Filled 2018-10-31: qty 5

## 2018-10-31 MED ORDER — OXYCODONE HCL 5 MG PO TABS
5.0000 mg | ORAL_TABLET | ORAL | Status: DC | PRN
  Administered 2018-10-31 (×2): 5 mg via ORAL
  Filled 2018-10-31 (×2): qty 1

## 2018-10-31 MED ORDER — PROMETHAZINE HCL 25 MG/ML IJ SOLN: 6.2500 mg | INTRAMUSCULAR | Status: DC | PRN

## 2018-10-31 MED ORDER — SODIUM CHLORIDE 0.9 % IV SOLN
INTRAVENOUS | Status: DC | PRN
  Administered 2018-10-31: 20 ug/min via INTRAVENOUS

## 2018-10-31 MED ORDER — ACETAMINOPHEN 325 MG PO TABS: 650.0000 mg | ORAL_TABLET | ORAL | Status: DC | PRN

## 2018-10-31 MED ORDER — VANCOMYCIN HCL 1000 MG IV SOLR
INTRAVENOUS | Status: AC
  Filled 2018-10-31: qty 1000

## 2018-10-31 MED ORDER — ONDANSETRON HCL 4 MG/2ML IJ SOLN
4.0000 mg | Freq: Four times a day (QID) | INTRAMUSCULAR | Status: DC | PRN
  Administered 2018-10-31: 4 mg via INTRAVENOUS
  Filled 2018-10-31: qty 2

## 2018-10-31 MED ORDER — PHENYLEPHRINE 40 MCG/ML (10ML) SYRINGE FOR IV PUSH (FOR BLOOD PRESSURE SUPPORT)
PREFILLED_SYRINGE | INTRAVENOUS | Status: DC | PRN
  Administered 2018-10-31 (×5): 80 ug via INTRAVENOUS

## 2018-10-31 MED ORDER — PROPOFOL 10 MG/ML IV BOLUS
INTRAVENOUS | Status: AC
  Filled 2018-10-31: qty 20

## 2018-10-31 MED ORDER — IOPAMIDOL (ISOVUE-300) INJECTION 61%
100.0000 mL | Freq: Once | INTRAVENOUS | Status: AC | PRN
  Administered 2018-10-31: 100 mL via INTRAVENOUS

## 2018-10-31 MED ORDER — TOBRAMYCIN SULFATE 1.2 G IJ SOLR
INTRAMUSCULAR | Status: DC | PRN
  Administered 2018-10-31: 1.2 g via TOPICAL

## 2018-10-31 MED ORDER — OXYCODONE HCL 5 MG PO TABS
10.0000 mg | ORAL_TABLET | ORAL | Status: DC | PRN
  Administered 2018-11-01 – 2018-11-04 (×10): 10 mg via ORAL
  Filled 2018-10-31 (×10): qty 2

## 2018-10-31 MED ORDER — ACETAMINOPHEN 500 MG PO TABS
1000.0000 mg | ORAL_TABLET | Freq: Once | ORAL | Status: AC
  Administered 2018-10-31: 14:00:00 1000 mg via ORAL
  Filled 2018-10-31: qty 2

## 2018-10-31 MED ORDER — ROCURONIUM BROMIDE 10 MG/ML (PF) SYRINGE
PREFILLED_SYRINGE | INTRAVENOUS | Status: AC
  Filled 2018-10-31: qty 20

## 2018-10-31 MED ORDER — IPRATROPIUM-ALBUTEROL 0.5-2.5 (3) MG/3ML IN SOLN
3.0000 mL | Freq: Four times a day (QID) | RESPIRATORY_TRACT | Status: DC | PRN
  Administered 2018-10-31: 3 mL via RESPIRATORY_TRACT
  Filled 2018-10-31: qty 3

## 2018-10-31 MED ORDER — SODIUM CHLORIDE 0.9 % IR SOLN
Status: DC | PRN
  Administered 2018-10-31: 6000 mL

## 2018-10-31 MED ORDER — FENTANYL CITRATE (PF) 100 MCG/2ML IJ SOLN
INTRAMUSCULAR | Status: DC | PRN
  Administered 2018-10-31: 75 ug via INTRAVENOUS
  Administered 2018-10-31: 25 ug via INTRAVENOUS

## 2018-10-31 MED ORDER — DEXAMETHASONE SODIUM PHOSPHATE 10 MG/ML IJ SOLN
INTRAMUSCULAR | Status: AC
  Filled 2018-10-31: qty 2

## 2018-10-31 MED ORDER — BACITRACIN ZINC 500 UNIT/GM EX OINT
TOPICAL_OINTMENT | CUTANEOUS | Status: AC
  Filled 2018-10-31: qty 28.35

## 2018-10-31 MED ORDER — HEPARIN SODIUM (PORCINE) 5000 UNIT/ML IJ SOLN
5000.0000 [IU] | Freq: Three times a day (TID) | INTRAMUSCULAR | Status: DC
  Administered 2018-10-31 – 2018-11-06 (×17): 5000 [IU] via SUBCUTANEOUS
  Filled 2018-10-31 (×17): qty 1

## 2018-10-31 MED ORDER — LIDOCAINE 2% (20 MG/ML) 5 ML SYRINGE
INTRAMUSCULAR | Status: AC
  Filled 2018-10-31: qty 5

## 2018-10-31 MED ORDER — ONDANSETRON HCL 4 MG/2ML IJ SOLN
INTRAMUSCULAR | Status: AC
  Filled 2018-10-31: qty 2

## 2018-10-31 MED ORDER — MORPHINE SULFATE (PF) 2 MG/ML IV SOLN
1.0000 mg | INTRAVENOUS | Status: DC | PRN
  Administered 2018-10-31 – 2018-11-02 (×3): 2 mg via INTRAVENOUS
  Filled 2018-10-31 (×3): qty 1

## 2018-10-31 MED ORDER — ALBUMIN HUMAN 5 % IV SOLN
INTRAVENOUS | Status: DC | PRN
  Administered 2018-10-31 (×2): via INTRAVENOUS

## 2018-10-31 MED ORDER — SODIUM CHLORIDE 0.9 % IV SOLN
2.0000 g | INTRAVENOUS | Status: AC
  Administered 2018-10-31 – 2018-11-02 (×3): 2 g via INTRAVENOUS
  Filled 2018-10-31 (×3): qty 20

## 2018-10-31 MED ORDER — CEFAZOLIN SODIUM-DEXTROSE 1-4 GM/50ML-% IV SOLN
1.0000 g | Freq: Three times a day (TID) | INTRAVENOUS | Status: DC
  Administered 2018-10-31: 1 g via INTRAVENOUS
  Filled 2018-10-31: qty 50

## 2018-10-31 MED ORDER — SUGAMMADEX SODIUM 200 MG/2ML IV SOLN
INTRAVENOUS | Status: DC | PRN
  Administered 2018-10-31: 150 mg via INTRAVENOUS

## 2018-10-31 MED ORDER — POTASSIUM CHLORIDE IN NACL 20-0.9 MEQ/L-% IV SOLN
INTRAVENOUS | Status: DC
  Administered 2018-10-31 – 2018-11-03 (×6): via INTRAVENOUS
  Filled 2018-10-31 (×5): qty 1000

## 2018-10-31 MED ORDER — INSULIN ASPART 100 UNIT/ML ~~LOC~~ SOLN
0.0000 [IU] | SUBCUTANEOUS | Status: DC
  Administered 2018-10-31: 2 [IU] via SUBCUTANEOUS
  Administered 2018-10-31: 3 [IU] via SUBCUTANEOUS
  Administered 2018-11-01 (×2): 2 [IU] via SUBCUTANEOUS

## 2018-10-31 MED ORDER — HEPARIN SODIUM (PORCINE) 5000 UNIT/ML IJ SOLN: 5000.0000 [IU] | Freq: Three times a day (TID) | INTRAMUSCULAR | Status: DC

## 2018-10-31 MED ORDER — GABAPENTIN 100 MG PO CAPS
100.0000 mg | ORAL_CAPSULE | Freq: Three times a day (TID) | ORAL | Status: DC
  Administered 2018-10-31 – 2018-11-01 (×3): 100 mg via ORAL
  Filled 2018-10-31 (×3): qty 1

## 2018-10-31 MED ORDER — PANTOPRAZOLE SODIUM 40 MG PO TBEC
40.0000 mg | DELAYED_RELEASE_TABLET | Freq: Every day | ORAL | Status: DC
  Administered 2018-11-01 – 2018-11-06 (×6): 40 mg via ORAL
  Filled 2018-10-31 (×6): qty 1

## 2018-10-31 MED ORDER — ESMOLOL HCL 100 MG/10ML IV SOLN
INTRAVENOUS | Status: AC
  Filled 2018-10-31: qty 10

## 2018-10-31 MED ORDER — HYDRALAZINE HCL 20 MG/ML IJ SOLN: 10.0000 mg | INTRAMUSCULAR | Status: DC | PRN

## 2018-10-31 MED ORDER — PROPOFOL 10 MG/ML IV BOLUS
INTRAVENOUS | Status: DC | PRN
  Administered 2018-10-31: 130 mg via INTRAVENOUS

## 2018-10-31 SURGICAL SUPPLY — 61 items
APL PRP STRL LF DISP 70% ISPRP (MISCELLANEOUS) ×2
BAR GLASS FIBER EXFX 11X500 (EXFIX) ×4 IMPLANT
BIT DRILL CANN MED FLUTE 4.0 (BIT) ×1 IMPLANT
BLADE OSCILLATING /SAGITTAL (BLADE) ×2 IMPLANT
BLADE SAW RECIP 87.9 MT (BLADE) ×2 IMPLANT
BLADE SAW SGTL 18X1.27X75 (BLADE) ×2 IMPLANT
BNDG COHESIVE 4X5 TAN STRL (GAUZE/BANDAGES/DRESSINGS) ×2 IMPLANT
BNDG ELASTIC 6X10 VLCR STRL LF (GAUZE/BANDAGES/DRESSINGS) ×2 IMPLANT
BNDG GAUZE ELAST 4 BULKY (GAUZE/BANDAGES/DRESSINGS) ×2 IMPLANT
BRUSH SCRUB EZ PLAIN DRY (MISCELLANEOUS) ×4 IMPLANT
CANISTER WOUND CARE 500ML ATS (WOUND CARE) ×2 IMPLANT
CHLORAPREP W/TINT 26 (MISCELLANEOUS) ×4 IMPLANT
COVER MAYO STAND STRL (DRAPES) IMPLANT
COVER SURGICAL LIGHT HANDLE (MISCELLANEOUS) IMPLANT
COVER WAND RF STERILE (DRAPES) IMPLANT
DRAPE C-ARMOR (DRAPES) ×2 IMPLANT
DRAPE ORTHO SPLIT 77X108 STRL (DRAPES) ×1
DRAPE SURG 17X23 STRL (DRAPES) ×2 IMPLANT
DRAPE SURG ORHT 6 SPLT 77X108 (DRAPES) ×1 IMPLANT
DRAPE U-SHAPE 47X51 STRL (DRAPES) ×2 IMPLANT
DRILL CANN 4.0MM (BIT) ×2
DRSG ADAPTIC 3X8 NADH LF (GAUZE/BANDAGES/DRESSINGS) ×2 IMPLANT
ELECT REM PT RETURN 9FT ADLT (ELECTROSURGICAL)
ELECTRODE REM PT RTRN 9FT ADLT (ELECTROSURGICAL) IMPLANT
EVACUATOR 1/8 PVC DRAIN (DRAIN) IMPLANT
GAUZE SPONGE 4X4 12PLY STRL (GAUZE/BANDAGES/DRESSINGS) ×4 IMPLANT
GLOVE BIO SURGEON STRL SZ 6.5 (GLOVE) ×6 IMPLANT
GLOVE BIO SURGEON STRL SZ7.5 (GLOVE) ×10 IMPLANT
GLOVE BIOGEL PI IND STRL 6.5 (GLOVE) ×1 IMPLANT
GLOVE BIOGEL PI IND STRL 7.5 (GLOVE) ×1 IMPLANT
GLOVE BIOGEL PI INDICATOR 6.5 (GLOVE) ×1
GLOVE BIOGEL PI INDICATOR 7.5 (GLOVE) ×1
GOWN STRL REUS W/ TWL LRG LVL3 (GOWN DISPOSABLE) ×2 IMPLANT
GOWN STRL REUS W/TWL LRG LVL3 (GOWN DISPOSABLE) ×2
HANDPIECE INTERPULSE COAX TIP (DISPOSABLE) ×1
IV NS IRRIG 3000ML ARTHROMATIC (IV SOLUTION) ×4 IMPLANT
KIT BASIN OR (CUSTOM PROCEDURE TRAY) ×2 IMPLANT
KIT PREVENA INCISION MGT20CM45 (CANNISTER) ×2 IMPLANT
KIT TURNOVER KIT B (KITS) ×2 IMPLANT
MANIFOLD NEPTUNE II (INSTRUMENTS) ×2 IMPLANT
NS IRRIG 1000ML POUR BTL (IV SOLUTION) ×2 IMPLANT
PACK ORTHO EXTREMITY (CUSTOM PROCEDURE TRAY) ×2 IMPLANT
PAD ARMBOARD 7.5X6 YLW CONV (MISCELLANEOUS) IMPLANT
PADDING CAST COTTON 6X4 STRL (CAST SUPPLIES) ×2 IMPLANT
PIN BLUNT XTRFIX LG 5X160X55MM (Pin) ×4 IMPLANT
PIN CLAMP 2BAR 75MM BLUE (EXFIX) ×4 IMPLANT
PIN HALF 5X160X35 BLUNT TIP (EXFIX) ×4 IMPLANT
SET HNDPC FAN SPRY TIP SCT (DISPOSABLE) ×1 IMPLANT
SPONGE LAP 18X18 RF (DISPOSABLE) ×2 IMPLANT
SUT ETHILON 2 0 FS 18 (SUTURE) IMPLANT
SUT ETHILON 3 0 PS 1 (SUTURE) ×4 IMPLANT
SUT MON AB 2-0 CT1 36 (SUTURE) ×2 IMPLANT
SUT PDS AB 0 CT 36 (SUTURE) IMPLANT
SWAB CULTURE ESWAB REG 1ML (MISCELLANEOUS) IMPLANT
TOWEL GREEN STERILE (TOWEL DISPOSABLE) ×4 IMPLANT
TOWEL GREEN STERILE FF (TOWEL DISPOSABLE) ×2 IMPLANT
TRAY FOLEY MTR SLVR 16FR STAT (SET/KITS/TRAYS/PACK) ×2 IMPLANT
TUBE CONNECTING 12X1/4 (SUCTIONS) ×2 IMPLANT
UNDERPAD 30X30 (UNDERPADS AND DIAPERS) ×2 IMPLANT
WATER STERILE IRR 1000ML POUR (IV SOLUTION) ×2 IMPLANT
YANKAUER SUCT BULB TIP NO VENT (SUCTIONS) ×2 IMPLANT
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
## 2018-10-31 NOTE — Anesthesia Postprocedure Evaluation (Signed)
Anesthesia Post Note  Patient: Todd Davidson  Procedure(s) Performed: IRRIGATION AND DEBRIDEMENT RIGHT KNEE  EXTERNAL FIXATION OF FRACTURE (Right )     Patient location during evaluation: PACU Anesthesia Type: General Level of consciousness: sedated Pain management: pain level controlled Vital Signs Assessment: post-procedure vital signs reviewed and stable Respiratory status: spontaneous breathing and respiratory function stable Cardiovascular status: stable Postop Assessment: no apparent nausea or vomiting Anesthetic complications: no    Last Vitals:  Vitals:   10/31/18 1645 10/31/18 1700  BP: (!) 118/58 (!) 113/55  Pulse: 77 71  Resp: 15 13  Temp:    SpO2: 95% 92%    Last Pain:  Vitals:   10/31/18 1630  TempSrc:   PainSc: 2         RLE Motor Response: Purposeful movement;Responds to commands (10/31/18 1700) RLE Sensation: Full sensation (10/31/18 1700)      [place]
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
## 2018-10-31 NOTE — ED Notes (Signed)
Pt transported to CT ?

## 2018-10-31 NOTE — Anesthesia Procedure Notes (Signed)
Procedure Name: Intubation Date/Time: 10/31/2018 2:09 PM Performed by: Candis Shine, CRNA Pre-anesthesia Checklist: Patient identified, Emergency Drugs available, Suction available and Patient being monitored Patient Re-evaluated:Patient Re-evaluated prior to induction Oxygen Delivery Method: Circle System Utilized Preoxygenation: Pre-oxygenation with 100% oxygen Induction Type: IV induction, Rapid sequence and Cricoid Pressure applied Laryngoscope Size: Mac and 4 Grade View: Grade I Tube type: Oral Tube size: 7.5 mm Number of attempts: 1 Airway Equipment and Method: Stylet Placement Confirmation: ETT inserted through vocal cords under direct vision,  positive ETCO2 and breath sounds checked- equal and bilateral Secured at: 23 cm Tube secured with: Tape Dental Injury: Teeth and Oropharynx as per pre-operative assessment

## 2018-10-31 NOTE — ED Notes (Signed)
Pt returned from CT at this time.  

## 2018-10-31 NOTE — ED Notes (Signed)
Radiology at bedside for xrays. 

## 2018-10-31 NOTE — ED Notes (Signed)
Ortho paged. 

## 2018-10-31 NOTE — Anesthesia Preprocedure Evaluation (Addendum)
Anesthesia Evaluation  Patient identified by MRN, date of birth, ID band Patient awake    Reviewed: Allergy & Precautions, NPO status , Patient's Chart, lab work & pertinent test results  History of Anesthesia Complications Negative for: history of anesthetic complications  Airway Mallampati: II  TM Distance: >3 FB Neck ROM: Full    Dental  (+) Dental Advisory Given, Edentulous Upper   Pulmonary neg pulmonary ROS,  Rib FXs   Pulmonary exam normal        Cardiovascular hypertension, Pt. on medications Normal cardiovascular exam     Neuro/Psych Lumbar spine compression fractures: stable negative psych ROS   GI/Hepatic negative GI ROS, Neg liver ROS,   Endo/Other  negative endocrine ROS  Renal/GU negative Renal ROS  negative genitourinary   Musculoskeletal negative musculoskeletal ROS (+)   Abdominal   Peds negative pediatric ROS (+)  Hematology negative hematology ROS (+)   Anesthesia Other Findings   Reproductive/Obstetrics negative OB ROS                            Anesthesia Physical Anesthesia Plan  ASA: III  Anesthesia Plan: General   Post-op Pain Management:    Induction: Intravenous  PONV Risk Score and Plan: 3 and Ondansetron, Dexamethasone and Diphenhydramine  Airway Management Planned: Oral ETT  Additional Equipment:   Intra-op Plan:   Post-operative Plan: Possible Post-op intubation/ventilation  Informed Consent: I have reviewed the patients History and Physical, chart, labs and discussed the procedure including the risks, benefits and alternatives for the proposed anesthesia with the patient or authorized representative who has indicated his/her understanding and acceptance.     Dental advisory given  Plan Discussed with: CRNA and Anesthesiologist  Anesthesia Plan Comments:         Anesthesia Quick Evaluation
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
## 2018-10-31 NOTE — Progress Notes (Signed)
RT instructed patient on the use of incentive spirometer. Patient able to reach 2500 mL using the device.

## 2018-10-31 NOTE — Transfer of Care (Signed)
Immediate Anesthesia Transfer of Care Note  Patient: Todd Davidson  Procedure(s) Performed: IRRIGATION AND DEBRIDEMENT RIGHT KNEE  EXTERNAL FIXATION OF FRACTURE (Right )  Patient Location: PACU  Anesthesia Type:General  Level of Consciousness: awake, alert  and oriented  Airway & Oxygen Therapy: Patient Spontanous Breathing and Patient connected to face mask oxygen  Post-op Assessment: Report given to RN and Post -op Vital signs reviewed and stable  Post vital signs: Reviewed and stable  Last Vitals:  Vitals Value Taken Time  BP 117/60 10/31/18 1559  Temp    Pulse 77 10/31/18 1600  Resp 15 10/31/18 1600  SpO2 100 % 10/31/18 1600  Vitals shown include unvalidated device data.  Last Pain:  Vitals:   10/31/18 1050  TempSrc:   PainSc: 4          Complications: No apparent anesthesia complications
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
## 2018-11-01 ENCOUNTER — Encounter (HOSPITAL_COMMUNITY): Payer: Self-pay | Admitting: *Deleted

## 2018-11-01 ENCOUNTER — Inpatient Hospital Stay (HOSPITAL_COMMUNITY): Payer: Medicare Other

## 2018-11-01 ENCOUNTER — Other Ambulatory Visit: Payer: Self-pay

## 2018-11-01 LAB — COMPREHENSIVE METABOLIC PANEL
ALT: 42 U/L (ref 0–44)
AST: 45 U/L — ABNORMAL HIGH (ref 15–41)
Albumin: 3.2 g/dL — ABNORMAL LOW (ref 3.5–5.0)
Alkaline Phosphatase: 49 U/L (ref 38–126)
Anion gap: 8 (ref 5–15)
BUN: 26 mg/dL — ABNORMAL HIGH (ref 8–23)
CO2: 22 mmol/L (ref 22–32)
Calcium: 8.2 mg/dL — ABNORMAL LOW (ref 8.9–10.3)
Chloride: 108 mmol/L (ref 98–111)
Creatinine, Ser: 1.01 mg/dL (ref 0.61–1.24)
GFR calc Af Amer: >60 mL/min (ref >60)
GFR calc non Af Amer: >60 mL/min (ref >60)
Glucose, Bld: 206 mg/dL — ABNORMAL HIGH (ref 70–99)
Potassium: 4.8 mmol/L (ref 3.5–5.1)
Sodium: 138 mmol/L (ref 135–145)
Total Bilirubin: 0.5 mg/dL (ref 0.3–1.2)
Total Protein: 5 g/dL — ABNORMAL LOW (ref 6.5–8.1)

## 2018-11-01 LAB — CBC
HCT: 24.1 % — ABNORMAL LOW (ref 39.0–52.0)
HCT: 24.5 % — ABNORMAL LOW (ref 39.0–52.0)
Hemoglobin: 8.4 g/dL — ABNORMAL LOW (ref 13.0–17.0)
Hemoglobin: 8.4 g/dL — ABNORMAL LOW (ref 13.0–17.0)
MCH: 31 pg (ref 26.0–34.0)
MCH: 31.1 pg (ref 26.0–34.0)
MCHC: 34.9 g/dL (ref 30.0–36.0)
MCHC: 34.3 g/dL (ref 30.0–36.0)
MCV: 88.9 fL (ref 80.0–100.0)
MCV: 90.7 fL (ref 80.0–100.0)
Platelets: 70 10*3/uL — ABNORMAL LOW (ref 150–400)
Platelets: 75 10*3/uL — ABNORMAL LOW (ref 150–400)
RBC: 2.71 MIL/uL — ABNORMAL LOW (ref 4.22–5.81)
RBC: 2.7 MIL/uL — ABNORMAL LOW (ref 4.22–5.81)
RDW: 13.3 % (ref 11.5–15.5)
RDW: 13.8 % (ref 11.5–15.5)
WBC: 7.1 10*3/uL (ref 4.0–10.5)
WBC: 11 10*3/uL — ABNORMAL HIGH (ref 4.0–10.5)
nRBC: 0 % (ref 0.0–0.2)
nRBC: 0 % (ref 0.0–0.2)

## 2018-11-01 LAB — GLUCOSE, CAPILLARY
Glucose-Capillary: 128 mg/dL — ABNORMAL HIGH (ref 70–99)
Glucose-Capillary: 139 mg/dL — ABNORMAL HIGH (ref 70–99)
Glucose-Capillary: 153 mg/dL — ABNORMAL HIGH (ref 70–99)
Glucose-Capillary: 182 mg/dL — ABNORMAL HIGH (ref 70–99)
Glucose-Capillary: 192 mg/dL — ABNORMAL HIGH (ref 70–99)
Glucose-Capillary: 215 mg/dL — ABNORMAL HIGH (ref 70–99)

## 2018-11-01 LAB — VITAMIN D 25 HYDROXY (VIT D DEFICIENCY, FRACTURES): Vit D, 25-Hydroxy: 39.4 ng/mL (ref 30.0–100.0)

## 2018-11-01 MED ORDER — GABAPENTIN 400 MG PO CAPS
800.0000 mg | ORAL_CAPSULE | Freq: Two times a day (BID) | ORAL | Status: DC
  Administered 2018-11-01 – 2018-11-06 (×11): 800 mg via ORAL
  Filled 2018-11-01 (×11): qty 2

## 2018-11-01 MED ORDER — ENSURE ENLIVE PO LIQD
237.0000 mL | Freq: Two times a day (BID) | ORAL | Status: DC
  Administered 2018-11-01 – 2018-11-06 (×10): 237 mL via ORAL

## 2018-11-01 MED ORDER — BENAZEPRIL HCL 20 MG PO TABS: 40.0000 mg | ORAL_TABLET | Freq: Every day | ORAL | Status: DC

## 2018-11-01 MED ORDER — FINASTERIDE 5 MG PO TABS
5.0000 mg | ORAL_TABLET | Freq: Every day | ORAL | Status: DC
  Administered 2018-11-01 – 2018-11-06 (×6): 5 mg via ORAL
  Filled 2018-11-01 (×6): qty 1

## 2018-11-01 MED ORDER — INSULIN ASPART 100 UNIT/ML ~~LOC~~ SOLN
0.0000 [IU] | Freq: Three times a day (TID) | SUBCUTANEOUS | Status: DC
  Administered 2018-11-01: 1 [IU] via SUBCUTANEOUS
  Administered 2018-11-01: 18:00:00 3 [IU] via SUBCUTANEOUS
  Administered 2018-11-02: 1 [IU] via SUBCUTANEOUS
  Administered 2018-11-02 – 2018-11-03 (×4): 2 [IU] via SUBCUTANEOUS
  Administered 2018-11-04: 3 [IU] via SUBCUTANEOUS
  Administered 2018-11-04 – 2018-11-05 (×3): 1 [IU] via SUBCUTANEOUS
  Administered 2018-11-05 (×2): 2 [IU] via SUBCUTANEOUS
  Administered 2018-11-06: 3 [IU] via SUBCUTANEOUS
  Administered 2018-11-06: 09:00:00 1 [IU] via SUBCUTANEOUS

## 2018-11-01 MED ORDER — AMLODIPINE BESYLATE 10 MG PO TABS: 10.0000 mg | ORAL_TABLET | Freq: Every day | ORAL | Status: DC

## 2018-11-01 MED ORDER — TAMSULOSIN HCL 0.4 MG PO CAPS
0.4000 mg | ORAL_CAPSULE | Freq: Every day | ORAL | Status: DC
  Administered 2018-11-01 – 2018-11-05 (×5): 0.4 mg via ORAL
  Filled 2018-11-01 (×5): qty 1

## 2018-11-01 MED ORDER — INSULIN ASPART 100 UNIT/ML ~~LOC~~ SOLN: 0.0000 [IU] | Freq: Every day | SUBCUTANEOUS | Status: DC

## 2018-11-01 NOTE — Discharge Instructions (Addendum)
Orthopaedic Trauma Service Discharge Instructions   General Discharge Instructions  WEIGHT BEARING STATUS: Non-weightbearing on right leg for 8 weeks  RANGE OF MOTION/ACTIVITY: Work on ankle range of motion  Wound Care: Incisions can be left open to air if there is no drainage. If incision continues to have drainage, follow wound care instructions below. Okay to shower if no drainage from incisions. Okay to get external fixator wet. Follow instructions below for how to change pin site dressings   DVT/PE prophylaxis: Lovenox  Diet: as you were eating previously.  Can use over the counter stool softeners and bowel preparations, such as Miralax, to help with bowel movements.  Narcotics can be constipating.  Be sure to drink plenty of fluids  PAIN MEDICATION USE AND EXPECTATIONS  You have likely been given narcotic medications to help control your pain.  After a traumatic event that results in an fracture (broken bone) with or without surgery, it is ok to use narcotic pain medications to help control one's pain.  We understand that everyone responds to pain differently and each individual patient will be evaluated on a regular basis for the continued need for narcotic medications. Ideally, narcotic medication use should last no more than 6-8 weeks (coinciding with fracture healing).   As a patient it is your responsibility as well to monitor narcotic medication use and report the amount and frequency you use these medications when you come to your office visit.   We would also advise that if you are using narcotic medications, you should take a dose prior to therapy to maximize you participation.  IF YOU ARE ON NARCOTIC MEDICATIONS IT IS NOT PERMISSIBLE TO OPERATE A MOTOR VEHICLE (MOTORCYCLE/CAR/TRUCK/MOPED) OR HEAVY MACHINERY DO NOT MIX NARCOTICS WITH OTHER CNS (CENTRAL NERVOUS SYSTEM) DEPRESSANTS SUCH AS ALCOHOL   STOP SMOKING OR USING NICOTINE PRODUCTS!!!!  As discussed nicotine severely  impairs your body's ability to heal surgical and traumatic wounds but also impairs bone healing.  Wounds and bone heal by forming microscopic blood vessels (angiogenesis) and nicotine is a vasoconstrictor (essentially, shrinks blood vessels).  Therefore, if vasoconstriction occurs to these microscopic blood vessels they essentially disappear and are unable to deliver necessary nutrients to the healing tissue.  This is one modifiable factor that you can do to dramatically increase your chances of healing your injury.    (This means no smoking, no nicotine gum, patches, etc)  DO NOT USE NONSTEROIDAL ANTI-INFLAMMATORY DRUGS (NSAID'S)  Using products such as Advil (ibuprofen), Aleve (naproxen), Motrin (ibuprofen) for additional pain control during fracture healing can delay and/or prevent the healing response.  If you would like to take over the counter (OTC) medication, Tylenol (acetaminophen) is ok.  However, some narcotic medications that are given for pain control contain acetaminophen as well. Therefore, you should not exceed more than 4000 mg of tylenol in a day if you do not have liver disease.  Also note that there are may OTC medicines, such as cold medicines and allergy medicines that my contain tylenol as well.  If you have any questions about medications and/or interactions please ask your doctor/PA or your pharmacist.      ICE AND ELEVATE INJURED/OPERATIVE EXTREMITY  Using ice and elevating the injured extremity above your heart can help with swelling and pain control.  Icing in a pulsatile fashion, such as 20 minutes on and 20 minutes off, can be followed.    Do not place ice directly on skin. Make sure there is a barrier between to  skin and the ice pack.    Using frozen items such as frozen peas works well as the conform nicely to the are that needs to be iced.  USE AN ACE WRAP OR TED HOSE FOR SWELLING CONTROL  In addition to icing and elevation, Ace wraps or TED hose are used to help limit  and resolve swelling.  It is recommended to use Ace wraps or TED hose until you are informed to stop.    When using Ace Wraps start the wrapping distally (farthest away from the body) and wrap proximally (closer to the body)   Example: If you had surgery on your leg or thing and you do not have a splint on, start the ace wrap at the toes and work your way up to the thigh        If you had surgery on your upper extremity and do not have a splint on, start the ace wrap at your fingers and work your way up to the upper arm    [place]: [phone]   VISIT OUR WEBSITE FOR ADDITIONAL INFORMATION: [email or web address]      Discharge Pin Site Instructions  Dress pins daily with Kerlix roll starting on POD 2. Wrap the Kerlix so that it tamps the skin down around the pin-skin interface to prevent/limit motion of the skin relative to the pin.  (Pin-skin motion is the primary cause of pain and infection related to external fixator pin sites).  Remove any crust or coagulum that may obstruct drainage with a saline moistened gauze or soap and water.  After POD 3, if there is no discernable drainage on the pin site dressing, the interval for change can by increased to every other day.  You may shower with the fixator, cleaning all pin sites gently with soap and water.  If you have a surgical wound this needs to be completely dry and without drainage before showering.  The extremity can be lifted by the fixator to facilitate wound care and transfers.  Notify the office/Doctor if you experience increasing drainage, redness, or pain from a pin site, or if you notice purulent (thick, snot-like) drainage.  Discharge Wound Care Instructions  Do NOT apply any ointments, solutions or lotions to pin sites or surgical wounds.  These prevent needed drainage and even though solutions like hydrogen peroxide kill bacteria, they also damage cells lining the pin sites that help  fight infection.  Applying lotions or ointments can keep the wounds moist and can cause them to breakdown and open up as well. This can increase the risk for infection. When in doubt call the office.  Surgical incisions should be dressed daily.  If any drainage is noted, use one layer of adaptic, then gauze, Kerlix, and an ace wrap.  Once the incision is completely dry and without drainage, it may be left open to air out.  Showering may begin 36-48 hours later.  Cleaning gently with soap and water.  Traumatic wounds should be dressed daily as well.    One layer of adaptic, gauze, Kerlix, then ace wrap.  The adaptic can be discontinued once the draining has ceased    If you have a wet to dry dressing: wet the gauze with saline the squeeze as much saline out so the gauze is moist (not soaking wet), place moistened gauze over wound, then place a dry gauze over the moist one, followed by Kerlix wrap, then ace wrap.    How to  Use a Back Brace  A back brace is a form-fitting device that wraps around your trunk to support your lower back, abdomen, and hips. You may need to wear a back brace to relieve back pain or to correct a medical condition related to the back, such as abnormal curvature of the spine (scoliosis). A back brace can maintain or correct the shape of the spine and prevent a spinal problem from getting worse. A back brace can also take pressure off the layers of tissue (disks) between the bones of the spine (vertebrae). You may need a back brace to keep your back and spine in place while you heal from an injury or recover from surgery. Back braces can be either plastic (rigid brace) or soft elastic (dynamic brace).  A rigid brace usually covers both the front and back of the entire upper body.  A soft brace may cover only the lower back and abdomen and may fasten with self-adhesive elastic straps. Your health care provider will recommend the proper brace for your needs and medical  condition. What are the risks?  A back brace may not help if you do not wear it as directed by your health care provider. Be sure to wear the brace exactly as instructed in order to prevent further back problems.  Wearing the brace may be uncomfortable at first. You may have trouble sleeping with it on. It may also be hard for you to do certain activities while wearing it.  If the back brace is not worn as instructed, it may cause rubbing and pressure on your skin and may cause sores. How to use a back brace Different types of braces will have different instructions for use. Follow instructions from your health care provider about:  How to put on the brace.  When and how often to wear the brace. In some cases, braces may need to be worn for long stretches of time. For example, a brace may need to be worn for 16-23 hours a day when used for scoliosis.  How to take off the brace.  Any safety tips you should follow when wearing the brace. This may include: ? Move carefully while wearing the brace. The brace restricts your movement and could lead to additional injuries. ? Use a cane or walker for support if you feel unsteady. ? Sit in high, firm chairs. It may be difficult to stand up from low, soft chairs. ? Check the skin around the brace every day. Tell your health care provider about any concerns. How to care for a back brace  Do not let the back brace get wet. Typically, you will remove the brace for bathing and then put it back on afterward.  If you have a rigid brace, be sure to store it in a safe place when you are not wearing it. This will help to prevent damage.  Clean or wash the back brace with mild soap and water as told by your health care provider. Contact a health care provider if:  Your brace gets damaged.  You have pain or discomfort when wearing the back brace.  Your back pain is getting worse or is not improving over time.  You notice redness or skin breakdown  from wearing the brace. Summary  You may need to wear a back brace to relieve back pain or to correct a medical condition related to the back, such as abnormal curvature of the spine (scoliosis).  Follow instructions as told by your health care  provider about how to use the brace and how to take care of it.  A back brace may not help if you do not wear it as directed by your health care provider. Wear the brace exactly as instructed in order to prevent further back problems.  Move carefully while wearing the brace.  Contact a health care provider if you have pain or discomfort when wearing the back brace or your back pain is getting worse or is not improving over time. This information is not intended to replace advice given to you by your health care provider. Make sure you discuss any questions you have with your health care provider. Document Released: 02/24/2011 Document Revised: 01/31/2018 Document Reviewed: 01/31/2018 Elsevier Patient Education  2020 [employer].

## 2018-11-01 NOTE — Progress Notes (Signed)
Md was paged about foley since there is no orders. Foley was placed in OR. Per Dr. Alcide Clever to keep foley since pt should be flat for now.
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
## 2018-11-01 NOTE — Progress Notes (Signed)
Talked to Dr. Doreatha Martin .told him that pt wanted still  to have the foley in placed;he said ok but it has to come out tomorrow.
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
## 2018-11-01 NOTE — Plan of Care (Signed)
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
## 2018-11-02 ENCOUNTER — Encounter (HOSPITAL_COMMUNITY): Payer: Self-pay | Admitting: Student

## 2018-11-02 LAB — CBC
HCT: 22.9 % — ABNORMAL LOW (ref 39.0–52.0)
Hemoglobin: 7.7 g/dL — ABNORMAL LOW (ref 13.0–17.0)
MCH: 30.6 pg (ref 26.0–34.0)
MCHC: 33.6 g/dL (ref 30.0–36.0)
MCV: 90.9 fL (ref 80.0–100.0)
Platelets: 60 10*3/uL — ABNORMAL LOW (ref 150–400)
RBC: 2.52 MIL/uL — ABNORMAL LOW (ref 4.22–5.81)
RDW: 13.7 % (ref 11.5–15.5)
WBC: 6.8 10*3/uL (ref 4.0–10.5)
nRBC: 0 % (ref 0.0–0.2)

## 2018-11-02 LAB — GLUCOSE, CAPILLARY
Glucose-Capillary: 147 mg/dL — ABNORMAL HIGH (ref 70–99)
Glucose-Capillary: 164 mg/dL — ABNORMAL HIGH (ref 70–99)
Glucose-Capillary: 137 mg/dL — ABNORMAL HIGH (ref 70–99)
Glucose-Capillary: 158 mg/dL — ABNORMAL HIGH (ref 70–99)
Glucose-Capillary: 157 mg/dL — ABNORMAL HIGH (ref 70–99)

## 2018-11-02 MED ORDER — METHOCARBAMOL 500 MG PO TABS
500.0000 mg | ORAL_TABLET | Freq: Three times a day (TID) | ORAL | Status: DC
  Administered 2018-11-02 – 2018-11-06 (×13): 500 mg via ORAL
  Filled 2018-11-02 (×13): qty 1
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
## 2018-11-03 ENCOUNTER — Inpatient Hospital Stay (HOSPITAL_COMMUNITY): Payer: Medicare Other

## 2018-11-03 LAB — CBC
HCT: 23.3 % — ABNORMAL LOW (ref 39.0–52.0)
Hemoglobin: 7.9 g/dL — ABNORMAL LOW (ref 13.0–17.0)
MCH: 31.1 pg (ref 26.0–34.0)
MCHC: 33.9 g/dL (ref 30.0–36.0)
MCV: 91.7 fL (ref 80.0–100.0)
Platelets: 65 10*3/uL — ABNORMAL LOW (ref 150–400)
RBC: 2.54 MIL/uL — ABNORMAL LOW (ref 4.22–5.81)
RDW: 13.6 % (ref 11.5–15.5)
WBC: 6.1 10*3/uL (ref 4.0–10.5)
nRBC: 0 % (ref 0.0–0.2)

## 2018-11-03 LAB — BASIC METABOLIC PANEL
Anion gap: 7 (ref 5–15)
BUN: 15 mg/dL (ref 8–23)
CO2: 23 mmol/L (ref 22–32)
Calcium: 8.3 mg/dL — ABNORMAL LOW (ref 8.9–10.3)
Chloride: 108 mmol/L (ref 98–111)
Creatinine, Ser: 0.85 mg/dL (ref 0.61–1.24)
GFR calc Af Amer: >60 mL/min (ref >60)
GFR calc non Af Amer: >60 mL/min (ref >60)
Glucose, Bld: 138 mg/dL — ABNORMAL HIGH (ref 70–99)
Potassium: 4.1 mmol/L (ref 3.5–5.1)
Sodium: 138 mmol/L (ref 135–145)

## 2018-11-03 LAB — GLUCOSE, CAPILLARY
Glucose-Capillary: 120 mg/dL — ABNORMAL HIGH (ref 70–99)
Glucose-Capillary: 178 mg/dL — ABNORMAL HIGH (ref 70–99)
Glucose-Capillary: 155 mg/dL — ABNORMAL HIGH (ref 70–99)
Glucose-Capillary: 178 mg/dL — ABNORMAL HIGH (ref 70–99)

## 2018-11-03 LAB — NOVEL CORONAVIRUS, NAA (HOSP ORDER, SEND-OUT TO REF LAB; TAT 18-24 HRS): SARS-CoV-2, NAA: NOT DETECTED

## 2018-11-03 MED ORDER — POLYETHYLENE GLYCOL 3350 17 G PO PACK
17.0000 g | PACK | Freq: Every day | ORAL | Status: DC
  Administered 2018-11-03 – 2018-11-05 (×3): 17 g via ORAL
  Filled 2018-11-03 (×3): qty 1
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
## 2018-11-03 NOTE — Progress Notes (Signed)
Patient ID: Todd Davidson, male   DOB: 1935/12/19, 83 y.o.   MRN: [id number] xrays reviewed, mild compression of L1 and L4, no new recs, cont brace
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
## 2018-11-04 LAB — CBC
HCT: 23.5 % — ABNORMAL LOW (ref 39.0–52.0)
Hemoglobin: 8 g/dL — ABNORMAL LOW (ref 13.0–17.0)
MCH: 30.8 pg (ref 26.0–34.0)
MCHC: 34 g/dL (ref 30.0–36.0)
MCV: 90.4 fL (ref 80.0–100.0)
Platelets: 66 10*3/uL — ABNORMAL LOW (ref 150–400)
RBC: 2.6 MIL/uL — ABNORMAL LOW (ref 4.22–5.81)
RDW: 13.6 % (ref 11.5–15.5)
WBC: 5.7 10*3/uL (ref 4.0–10.5)
nRBC: 0 % (ref 0.0–0.2)

## 2018-11-04 LAB — GLUCOSE, CAPILLARY
Glucose-Capillary: 142 mg/dL — ABNORMAL HIGH (ref 70–99)
Glucose-Capillary: 142 mg/dL — ABNORMAL HIGH (ref 70–99)
Glucose-Capillary: 239 mg/dL — ABNORMAL HIGH (ref 70–99)
Glucose-Capillary: 175 mg/dL — ABNORMAL HIGH (ref 70–99)

## 2018-11-04 NOTE — Progress Notes (Signed)
Patient ID: Todd Davidson, male   DOB: 15-Dec-1935, 83 y.o.   MRN: [id number] Subjective: Patient reports some back soreness.  Complains of some cramping in the right leg.  Objective: Vital signs in last 24 hours: Temp:  [97.9 F (36.6 C)-98.4 F (36.9 C)] 98.4 F (36.9 C) (08/16 0544) Pulse Rate:  [60-87] 82 (08/16 0822) Resp:  [16-18] 18 (08/16 0822) BP: (130-154)/(51-90) 147/51 (08/16 0822) SpO2:  [94 %-99 %] 97 % (08/16 0822)  Intake/Output from previous day: 08/15 0701 - 08/16 0700 In: 720 [P.O.:720] Out: 1800 [Urine:1800] Intake/Output this shift: No intake/output data recorded.  Neurologic: Grossly normal  Lab Results: Lab Results  Component Value Date   WBC 5.7 11/04/2018   HGB 8.0 (L) 11/04/2018   HCT 23.5 (L) 11/04/2018   MCV 90.4 11/04/2018   PLT 66 (L) 11/04/2018   Lab Results  Component Value Date   INR 1.0 10/31/2018   BMET Lab Results  Component Value Date   NA 138 11/03/2018   K 4.1 11/03/2018   CL 108 11/03/2018   CO2 23 11/03/2018   GLUCOSE 138 (H) 11/03/2018   BUN 15 11/03/2018   CREATININE 0.85 11/03/2018   CALCIUM 8.3 (L) 11/03/2018    Studies/Results: Dg Lumbar Spine 2-3 Views  Result Date: 11/03/2018 CLINICAL DATA:  Lumbar compression fracture. EXAM: LUMBAR SPINE - 2-3 VIEW COMPARISON:  CT abdomen pelvis dated October 31, 2018. FINDINGS: Five lumbar type vertebral bodies. Acute L1 and L4 compression fractures again noted with new mild height loss. Remaining vertebral body heights are preserved. Alignment is normal. Unchanged mild to moderate disc height loss at L2-L3. IMPRESSION: 1. Acute L1 and L4 compression fractures with new mild height loss compared to CT from 3 days ago. Electronically Signed   By: Titus Dubin M.D.   On: 11/03/2018 12:25    Assessment/Plan: L1 and L4 fractures.  Probably discharged to a skilled nursing facility tomorrow.  Please have him follow-up with me 2 weeks after discharge.  Continue to wear brace when out  of bed.  Estimated body mass index is 18.99 kg/m as calculated from the following:   Height as of this encounter: 6' (1.829 m).   Weight as of this encounter: 63.5 kg.    LOS: 4 days    Eustace Moore 11/04/2018, 8:59 AM
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
## 2018-11-05 LAB — GLUCOSE, CAPILLARY
Glucose-Capillary: 191 mg/dL — ABNORMAL HIGH (ref 70–99)
Glucose-Capillary: 181 mg/dL — ABNORMAL HIGH (ref 70–99)
Glucose-Capillary: 141 mg/dL — ABNORMAL HIGH (ref 70–99)
Glucose-Capillary: 181 mg/dL — ABNORMAL HIGH (ref 70–99)

## 2018-11-05 MED ORDER — POLYETHYLENE GLYCOL 3350 17 G PO PACK
17.0000 g | PACK | Freq: Every day | ORAL | Status: DC
  Administered 2018-11-06: 10:00:00 17 g via ORAL
  Filled 2018-11-05: qty 1

## 2018-11-05 MED ORDER — MAGNESIUM HYDROXIDE 400 MG/5ML PO SUSP: 30.0000 mL | Freq: Every day | ORAL | 0 refills | Status: DC | PRN

## 2018-11-05 MED ORDER — POLYETHYLENE GLYCOL 3350 17 G PO PACK: 17.0000 g | PACK | Freq: Every day | ORAL | 0 refills | Status: DC

## 2018-11-05 MED ORDER — HEPARIN SODIUM (PORCINE) 5000 UNIT/ML IJ SOLN: 5000.0000 [IU] | Freq: Three times a day (TID) | INTRAMUSCULAR | Status: DC

## 2018-11-05 MED ORDER — OXYCODONE HCL 5 MG PO TABS: 5.0000 mg | ORAL_TABLET | ORAL | 0 refills | Status: DC | PRN

## 2018-11-05 MED ORDER — MAGNESIUM CITRATE PO SOLN
0.5000 | Freq: Once | ORAL | Status: AC | PRN
  Administered 2018-11-05: 0.5 via ORAL
  Filled 2018-11-05: qty 296

## 2018-11-05 MED ORDER — POLYETHYLENE GLYCOL 3350 17 G PO PACK: 17.0000 g | PACK | Freq: Two times a day (BID) | ORAL | Status: DC

## 2018-11-05 MED ORDER — METHOCARBAMOL 500 MG PO TABS: 500.0000 mg | ORAL_TABLET | Freq: Three times a day (TID) | ORAL | Status: DC | PRN

## 2018-11-05 MED ORDER — DOCUSATE SODIUM 100 MG PO CAPS
100.0000 mg | ORAL_CAPSULE | Freq: Two times a day (BID) | ORAL | Status: DC
  Administered 2018-11-05 – 2018-11-06 (×3): 100 mg via ORAL
  Filled 2018-11-05 (×3): qty 1

## 2018-11-05 MED ORDER — BISACODYL 10 MG RE SUPP
10.0000 mg | Freq: Once | RECTAL | Status: AC
  Administered 2018-11-05: 10 mg via RECTAL
  Filled 2018-11-05: qty 1

## 2018-11-05 MED ORDER — MAGNESIUM HYDROXIDE 400 MG/5ML PO SUSP
30.0000 mL | Freq: Once | ORAL | Status: AC
  Administered 2018-11-05: 30 mL via ORAL
  Filled 2018-11-05: qty 30

## 2018-11-05 MED ORDER — DOCUSATE SODIUM 100 MG PO CAPS: 100.0000 mg | ORAL_CAPSULE | Freq: Two times a day (BID) | ORAL | 0 refills | Status: DC

## 2018-11-05 MED ORDER — IPRATROPIUM-ALBUTEROL 0.5-2.5 (3) MG/3ML IN SOLN: 3.0000 mL | Freq: Four times a day (QID) | RESPIRATORY_TRACT | Status: DC | PRN

## 2018-11-05 MED ORDER — ACETAMINOPHEN 325 MG PO TABS: 650.0000 mg | ORAL_TABLET | Freq: Four times a day (QID) | ORAL | Status: DC | PRN
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
## 2018-11-05 NOTE — Care Management Important Message (Signed)
Important Message  Patient Details  Name: Todd Davidson MRN: [id number] Date of Birth: 08/05/1935   Medicare Important Message Given:  Yes     Todd Davidson Montine Circle 11/05/2018, 2:59 PM
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
## 2018-11-05 NOTE — Plan of Care (Signed)
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
## 2018-11-05 NOTE — Plan of Care (Signed)
  Problem: Pain Managment: Goal: General experience of comfort will improve Outcome: Progressing   Problem: Safety: Goal: Ability to remain free from injury will improve Outcome: Progressing   Problem: Skin Integrity: Goal: Risk for impaired skin integrity will decrease Outcome: Progressing   

## 2018-11-05 NOTE — Care Management (Signed)
Todd Davidson is frustrated about going to SNF in [place], and wishes there was a bed available in [place] or [place] for him.  He is somewhat overwhelmed about the admission paperwork for the facility and has a lot of questions.  I have touched base with [name] with [facility], and she plans to come to hospital tomorrow after 10am to review paperwork with pt and answer any questions he may have.  We will plan discharge tomorrow.    [name], RN, BSN  Trauma/Neuro ICU Case Manager [phone]
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
## 2018-11-05 NOTE — Progress Notes (Signed)
PT Cancellation Note  Patient Details Name: Todd Davidson MRN: [id number] DOB: 06/23/1935   Cancelled Treatment:    Reason Eval/Treat Not Completed: (P) Patient at procedure or test/unavailable(Pt working with OT for foot plate application at 8948, returned at 16:13 and he was on the phone with someone and trauma casemanager was waiting to speak with him, will f/u if time permits.)   [name] J Stann Mainland 11/05/2018, 4:21 PM Governor Rooks, PTA Acute Rehabilitation Services Pager [phone] Office [phone]

## 2018-11-05 NOTE — Care Management (Addendum)
Pt given bed offers for SNF.  He has chosen [facility] [place].  Will notify attending MD and admissions coordinator for SNF to check bed availability for today.  [name], RN, BSN  Trauma/Neuro ICU Case Manager [phone]
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
## 2018-11-06 LAB — GLUCOSE, CAPILLARY
Glucose-Capillary: 138 mg/dL — ABNORMAL HIGH (ref 70–99)
Glucose-Capillary: 243 mg/dL — ABNORMAL HIGH (ref 70–99)
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
## 2018-11-06 NOTE — Plan of Care (Signed)
  Problem: Pain Managment: Goal: General experience of comfort will improve Outcome: Progressing   Problem: Safety: Goal: Ability to remain free from injury will improve Outcome: Progressing   Problem: Skin Integrity: Goal: Risk for impaired skin integrity will decrease Outcome: Progressing   
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
## 2018-11-06 NOTE — Progress Notes (Signed)
A discharge packet has been printed for [facility] .  It will be given to the nursing staff at [facility].  Discharge instructions were reviewed with [place], (nurse).  The patient will be discharged to [facility] via [facility] today.

## 2018-11-06 NOTE — Care Management (Addendum)
Pt has signed all admission paperwork with [facility] representative.  Pt to dc to room 113 at facility.  Bedside nurse to call report to [phone] at SNF.  Pt updated.  [facility] notified for transport at 1240.  Packet prepared and with chart.   [name], RN, BSN  Trauma/Neuro ICU Case Manager [phone]

## 2018-11-07 ENCOUNTER — Encounter: Payer: Self-pay | Admitting: Student

## 2018-11-07 ENCOUNTER — Encounter: Payer: Self-pay | Admitting: Radiation Oncology

## 2018-11-07 DIAGNOSIS — S2220XA Unspecified fracture of sternum, initial encounter for closed fracture: Secondary | ICD-10-CM | POA: Insufficient documentation

## 2018-11-07 DIAGNOSIS — S2239XA Fracture of one rib, unspecified side, initial encounter for closed fracture: Secondary | ICD-10-CM | POA: Insufficient documentation

## 2018-11-07 DIAGNOSIS — S72451C Displaced supracondylar fracture without intracondylar extension of lower end of right femur, initial encounter for open fracture type IIIA, IIIB, or IIIC: Secondary | ICD-10-CM | POA: Insufficient documentation

## 2018-11-07 DIAGNOSIS — S32009A Unspecified fracture of unspecified lumbar vertebra, initial encounter for closed fracture: Secondary | ICD-10-CM | POA: Insufficient documentation

## 2018-11-07 DIAGNOSIS — S2249XA Multiple fractures of ribs, unspecified side, initial encounter for closed fracture: Secondary | ICD-10-CM | POA: Insufficient documentation

## 2018-11-07 DIAGNOSIS — I2510 Atherosclerotic heart disease of native coronary artery without angina pectoris: Secondary | ICD-10-CM | POA: Insufficient documentation

## 2019-01-24 ENCOUNTER — Other Ambulatory Visit: Payer: Self-pay

## 2019-01-25 ENCOUNTER — Inpatient Hospital Stay: Payer: Medicare Other | Attending: Radiation Oncology

## 2019-01-25 ENCOUNTER — Other Ambulatory Visit: Payer: Self-pay

## 2019-01-25 DIAGNOSIS — C61 Malignant neoplasm of prostate: Secondary | ICD-10-CM | POA: Insufficient documentation

## 2019-01-25 DIAGNOSIS — R972 Elevated prostate specific antigen [PSA]: Secondary | ICD-10-CM

## 2019-01-25 LAB — PSA: Prostatic Specific Antigen: 1.68 ng/mL (ref 0.00–4.00)

## 2019-01-31 ENCOUNTER — Other Ambulatory Visit: Payer: Self-pay

## 2019-02-01 ENCOUNTER — Other Ambulatory Visit: Payer: Self-pay

## 2019-02-01 ENCOUNTER — Other Ambulatory Visit: Payer: Self-pay | Admitting: *Deleted

## 2019-02-01 ENCOUNTER — Encounter: Payer: Self-pay | Admitting: Radiation Oncology

## 2019-02-01 ENCOUNTER — Ambulatory Visit
Admission: RE | Admit: 2019-02-01 | Discharge: 2019-02-01 | Disposition: A | Discharge status: Home / Self Care | Payer: Medicare Other | Source: Ambulatory Visit | Attending: Radiation Oncology | Admitting: Radiation Oncology

## 2019-02-01 DIAGNOSIS — N2889 Other specified disorders of kidney and ureter: Secondary | ICD-10-CM | POA: Diagnosis not present

## 2019-02-01 DIAGNOSIS — C61 Malignant neoplasm of prostate: Secondary | ICD-10-CM | POA: Insufficient documentation
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
## 2019-02-04 ENCOUNTER — Telehealth: Payer: Self-pay | Admitting: Urology
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
## 2019-02-04 NOTE — Telephone Encounter (Signed)
Left message for patient to call back to schedule an app with Dr. Sinclair Grooms

## 2019-02-05 ENCOUNTER — Encounter: Payer: Self-pay | Admitting: Urology

## 2019-02-05 ENCOUNTER — Other Ambulatory Visit: Payer: Self-pay

## 2019-02-05 ENCOUNTER — Ambulatory Visit (INDEPENDENT_AMBULATORY_CARE_PROVIDER_SITE_OTHER): Payer: Medicare Other | Admitting: Urology

## 2019-02-05 VITALS — BP 149/74 | HR 72 | Ht 71.0 in | Wt 132.0 lb

## 2019-02-05 DIAGNOSIS — C61 Malignant neoplasm of prostate: Secondary | ICD-10-CM

## 2019-02-05 DIAGNOSIS — I25811 Atherosclerosis of native coronary artery of transplanted heart without angina pectoris: Secondary | ICD-10-CM | POA: Diagnosis not present

## 2019-02-05 DIAGNOSIS — N2889 Other specified disorders of kidney and ureter: Secondary | ICD-10-CM
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
## 2019-02-05 NOTE — Patient Instructions (Signed)
Cryoablation of Renal Tumors What is Cryoablation of Kidney Tumors? Cryoablation is a minimally invasive treatment for cancer.  During a cryoablation, an Interventional Radiologist uses imaging techniques such as ultrasound and computed tomography (CT) to guide a special needle to the tumor location.  Cooling gas is then passed into the needle (probe) to create an ice ball and to destroy the abnormal cells, sparing the healthy kidney tissue. How is the Procedure Performed? At [facility] cryoablation of kidney tumors is performed by a specially trained radiologist in the Interventional Radiology department. The procedure is typically performed on an outpatient basis and often you will be discharged home following a recovery period.  The procedure is performed under general anesthesia, so you will be asleep.    Once you are anesthetized and positioned on the procedure table, the area where the physician will insert the cryoprobes will be cleansed to sterilize the skin.  Your physician will then numb the skin and make a small nick where the cryoprobe will be inserted.  The physician will use ultrasound and CT to guide the probe to the site of the tumor.  Sometimes another organ like liver or bowel can be too close to the kidney for safe treatment of the tumor. In this case another needle may be inserted that is used to inject saline to push the  other organ further away from the treatment zone. This fluid is absorbed by your body rapidly after the procedure.  The tumor can then be treated with cryoablative ice, destroying the cancerous cells.  Once the radiologist has treated all of the tumors the probe is removed and a bandage is applied to the site.   What Should I Expect After the Procedure? At the completion of the procedure you will be monitored in the recovery room for approximately 4 hours. You may experience some discomfort after the procedure which will be controlled with pain medications  given to you through either your intravenous or by mouth.  After you have completely recovered from the procedure and anesthesia you will be discharged home with a friend or family member.  You will be sent home with prescriptions for pain medications and antibiotics. You will also have an appointment to be seen in the Interventional Radiology clinic in about one week. It is expected that you will be able to return to normal activity within a few days after your cryoablation.    What are the Risks of RFA? [place] Kitchen Infection . Pain - often in the shoulder . "Post Ablation Syndrome" - flu-like symptoms occurring 3-5 days after the procedure . Bleeding . Injury to the Organs and Tissues near the Kidney . Freeze injury to the skin

## 2019-02-19 ENCOUNTER — Other Ambulatory Visit: Payer: Self-pay | Admitting: Radiology

## 2019-02-19 DIAGNOSIS — Z8616 Personal history of COVID-19: Secondary | ICD-10-CM

## 2019-02-19 DIAGNOSIS — C61 Malignant neoplasm of prostate: Secondary | ICD-10-CM

## 2019-02-19 DIAGNOSIS — N2889 Other specified disorders of kidney and ureter: Secondary | ICD-10-CM

## 2019-02-19 HISTORY — DX: Personal history of COVID-19: Z86.16

## 2019-02-28 ENCOUNTER — Other Ambulatory Visit: Payer: Self-pay

## 2019-02-28 ENCOUNTER — Ambulatory Visit
Admission: RE | Admit: 2019-02-28 | Discharge: 2019-02-28 | Disposition: A | Discharge status: Home / Self Care | Payer: Medicare Other | Source: Ambulatory Visit | Attending: Urology | Admitting: Urology

## 2019-02-28 DIAGNOSIS — C61 Malignant neoplasm of prostate: Secondary | ICD-10-CM

## 2019-02-28 DIAGNOSIS — N2889 Other specified disorders of kidney and ureter: Secondary | ICD-10-CM

## 2019-02-28 HISTORY — PX: IR RADIOLOGIST EVAL & MGMT: IMG5224
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
## 2019-03-01 ENCOUNTER — Other Ambulatory Visit: Payer: Self-pay | Admitting: Interventional Radiology

## 2019-03-01 DIAGNOSIS — N2889 Other specified disorders of kidney and ureter: Secondary | ICD-10-CM

## 2019-03-04 ENCOUNTER — Ambulatory Visit (INDEPENDENT_AMBULATORY_CARE_PROVIDER_SITE_OTHER): Payer: Medicare Other | Admitting: Urology

## 2019-03-04 ENCOUNTER — Encounter: Payer: Self-pay | Admitting: Urology

## 2019-03-04 ENCOUNTER — Other Ambulatory Visit: Payer: Self-pay

## 2019-03-04 VITALS — BP 137/73 | HR 73 | Ht 71.0 in | Wt 130.0 lb

## 2019-03-04 DIAGNOSIS — N2889 Other specified disorders of kidney and ureter: Secondary | ICD-10-CM

## 2019-03-04 DIAGNOSIS — C61 Malignant neoplasm of prostate: Secondary | ICD-10-CM
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
## 2019-03-21 ENCOUNTER — Telehealth: Payer: Self-pay | Admitting: Infectious Diseases

## 2019-03-21 NOTE — Telephone Encounter (Signed)
Called to discuss with patient about Covid symptoms and the use of bamlanivimab, a monoclonal antibody infusion for those with mild to moderate Covid symptoms and at a high risk of hospitalization.  Pt is qualified for this infusion at the [facility] infusion center due to Age > 19.    Patient with dementia and unreliable to provide history. Spoke with his caretaker and brother [name]. He tested positive 03/19/19 - he has no upper respiratory problems from description of his brother (the patient was unaware he had COVID test recently). He has not had any change in quality. He did describe subjective fevers/chills on Christmas 12/25.   He currently seems to be feeling well but not eating normally. He has a lot of health conditions his brother worries about. Will see about appts open for Monday and if so call him back to discuss

## 2019-03-21 NOTE — Telephone Encounter (Signed)
No appointments available for Monday - patient will not be qualified to get infusion beyond this date. He feels that he would be fine to sit in recliner for up to 3 hours to receive even without [name] present.   Will place on cancellation for Monday.

## 2019-03-26 ENCOUNTER — Ambulatory Visit: Payer: Medicare Other

## 2019-03-27 ENCOUNTER — Telehealth: Payer: Self-pay | Admitting: Adult Health Nurse Practitioner

## 2019-03-27 ENCOUNTER — Other Ambulatory Visit: Payer: Medicare Other | Admitting: Adult Health Nurse Practitioner

## 2019-03-27 ENCOUNTER — Other Ambulatory Visit: Payer: Self-pay

## 2019-03-27 DIAGNOSIS — Z515 Encounter for palliative care: Secondary | ICD-10-CM

## 2019-03-27 DIAGNOSIS — F0391 Unspecified dementia with behavioral disturbance: Secondary | ICD-10-CM

## 2019-03-27 NOTE — Telephone Encounter (Signed)
Called for scheduled telephone visit.  No answer.  Left VM with call back info to reschedule. [place] Miers K. Olena Heckle NP
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
## 2019-04-02 ENCOUNTER — Other Ambulatory Visit: Payer: Self-pay | Admitting: Student

## 2019-04-02 DIAGNOSIS — S72414A Nondisplaced unspecified condyle fracture of lower end of right femur, initial encounter for closed fracture: Secondary | ICD-10-CM

## 2019-04-05 ENCOUNTER — Other Ambulatory Visit: Payer: Self-pay | Admitting: Urology

## 2019-04-09 ENCOUNTER — Telehealth: Payer: Self-pay

## 2019-04-09 ENCOUNTER — Other Ambulatory Visit: Payer: Self-pay

## 2019-04-09 ENCOUNTER — Ambulatory Visit
Admission: RE | Admit: 2019-04-09 | Discharge: 2019-04-09 | Disposition: A | Discharge status: Home / Self Care | Payer: Medicare Other | Source: Ambulatory Visit | Attending: Interventional Radiology | Admitting: Interventional Radiology

## 2019-04-09 DIAGNOSIS — N2889 Other specified disorders of kidney and ureter: Secondary | ICD-10-CM

## 2019-04-09 DIAGNOSIS — S72414A Nondisplaced unspecified condyle fracture of lower end of right femur, initial encounter for closed fracture: Secondary | ICD-10-CM | POA: Diagnosis present

## 2019-04-09 LAB — POCT I-STAT CREATININE: Creatinine, Ser: 1 mg/dL (ref 0.61–1.24)

## 2019-04-09 MED ORDER — IOHEXOL 300 MG/ML  SOLN
125.0000 mL | Freq: Once | INTRAMUSCULAR | Status: AC | PRN
  Administered 2019-04-09: 10:00:00 125 mL via INTRAVENOUS

## 2019-04-10 NOTE — Telephone Encounter (Signed)
SW received referral from Amy, NP regarding long-term care options. SW contacted Ronalee Belts (patient's brother) to discuss. Ronalee Belts provided brief history and overview. Ronalee Belts reports that patient has improved and that they feel that things are "okay" for now but did request information to be mailed to the home in case anything changed and they needed to consider other options in the future. SW agreed and mailed information, as well as SW contact information. Ronalee Belts was appreciative of palliative care team support.

## 2019-04-17 ENCOUNTER — Ambulatory Visit: Payer: 59

## 2019-04-17 ENCOUNTER — Telehealth: Payer: Self-pay | Admitting: *Deleted

## 2019-04-17 ENCOUNTER — Telehealth: Payer: Self-pay | Admitting: Urology

## 2019-04-17 NOTE — Telephone Encounter (Signed)
Pt's brother called and would like a call back to discuss results from his CT.

## 2019-04-17 NOTE — Telephone Encounter (Signed)
Son [name] called requesting a return call [phone]

## 2019-04-17 NOTE — Telephone Encounter (Signed)
Spoke with Todd Davidson and referred him to [facility] for the information he needed.

## 2019-04-17 NOTE — Telephone Encounter (Signed)
The Brother is not on [facility]

## 2019-04-18 ENCOUNTER — Telehealth: Payer: Self-pay

## 2019-04-18 NOTE — Telephone Encounter (Signed)
Called lmom/not in service informing patient of virtual visit. klh

## 2019-04-19 ENCOUNTER — Other Ambulatory Visit: Payer: Self-pay | Admitting: Interventional Radiology

## 2019-04-19 DIAGNOSIS — C61 Malignant neoplasm of prostate: Secondary | ICD-10-CM

## 2019-04-19 DIAGNOSIS — N2889 Other specified disorders of kidney and ureter: Secondary | ICD-10-CM

## 2019-04-22 ENCOUNTER — Ambulatory Visit: Payer: Medicare Other | Admitting: Internal Medicine

## 2019-04-30 ENCOUNTER — Ambulatory Visit
Admission: RE | Admit: 2019-04-30 | Discharge: 2019-04-30 | Disposition: A | Discharge status: Home / Self Care | Payer: Medicare Other | Source: Ambulatory Visit | Attending: Interventional Radiology | Admitting: Interventional Radiology

## 2019-04-30 ENCOUNTER — Other Ambulatory Visit: Payer: Self-pay

## 2019-04-30 ENCOUNTER — Encounter: Payer: Self-pay | Admitting: *Deleted

## 2019-04-30 DIAGNOSIS — C61 Malignant neoplasm of prostate: Secondary | ICD-10-CM

## 2019-04-30 DIAGNOSIS — N2889 Other specified disorders of kidney and ureter: Secondary | ICD-10-CM

## 2019-04-30 HISTORY — PX: IR RADIOLOGIST EVAL & MGMT: IMG5224
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
## 2019-05-01 ENCOUNTER — Other Ambulatory Visit (HOSPITAL_COMMUNITY): Payer: Self-pay | Admitting: Interventional Radiology

## 2019-05-01 DIAGNOSIS — N2889 Other specified disorders of kidney and ureter: Secondary | ICD-10-CM

## 2019-05-02 ENCOUNTER — Telehealth: Payer: Self-pay | Admitting: Adult Health Nurse Practitioner

## 2019-05-02 NOTE — Telephone Encounter (Signed)
Spoke with brother and set up appointment for 05/07/2019 at 11am Cyleigh Massaro K. Olena Heckle NP

## 2019-05-07 ENCOUNTER — Other Ambulatory Visit: Payer: Self-pay

## 2019-05-07 ENCOUNTER — Other Ambulatory Visit: Payer: Medicare Other | Admitting: Adult Health Nurse Practitioner

## 2019-05-07 DIAGNOSIS — F0391 Unspecified dementia with behavioral disturbance: Secondary | ICD-10-CM

## 2019-05-07 DIAGNOSIS — Z515 Encounter for palliative care: Secondary | ICD-10-CM
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
## 2019-05-17 ENCOUNTER — Ambulatory Visit: Payer: Medicare Other | Attending: Internal Medicine

## 2019-05-17 DIAGNOSIS — Z23 Encounter for immunization: Secondary | ICD-10-CM

## 2019-05-17 NOTE — Progress Notes (Signed)
   Covid-19 Vaccination Clinic  Name:  Todd Davidson    MRN: [id number] DOB: 09/16/1935  05/17/2019  [name] was observed post Covid-19 immunization for 15 minutes without incidence. He was provided with Vaccine Information Sheet and instruction to access the V-Safe system.   [name] was instructed to call 911 with any severe reactions post vaccine: Marland Kitchen Difficulty breathing  . Swelling of your face and throat  . A fast heartbeat  . A bad rash all over your body  . Dizziness and weakness    Immunizations Administered    Name Date Dose VIS Date Route   Pfizer COVID-19 Vaccine 05/17/2019  9:37 AM 0.3 mL 03/01/2019 Intramuscular   Manufacturer: [place]   Lot: [id number]   [place]: [id number]

## 2019-05-27 ENCOUNTER — Encounter (HOSPITAL_COMMUNITY): Payer: Self-pay

## 2019-05-27 NOTE — Patient Instructions (Addendum)
DUE TO COVID-19 ONLY ONE VISITOR IS ALLOWED TO COME WITH YOU AND STAY IN THE WAITING ROOM ONLY DURING PRE OP AND PROCEDURE. THE ONE VISITOR MAY VISIT WITH YOU IN YOUR PRIVATE ROOM DURING VISITING HOURS ONLY!!   COVID SWAB TESTING MUST BE COMPLETED ON: COVID WITHIN LAST 90 DAY NO RECHECK NEEDED AT THIS TIME      Your procedure is scheduled on: Wednesday, June 05, 2019   Report to [facility] Main  Entrance    Report to admitting at 6:30 AM   Call this number if you have problems the morning of surgery [phone]   Do not eat food or drink liquids :After Midnight.   Oral Hygiene is also important to reduce your risk of infection.                                    Remember - BRUSH YOUR TEETH THE MORNING OF SURGERY WITH YOUR REGULAR TOOTHPASTE   Do NOT smoke after Midnight   Take these medicines the morning of surgery with A SIP OF WATER: Amlodipine, Gabapentin, Omeprazole                               You may not have any metal on your body including jewelry, and body piercings             Do not wear lotions, powders, perfumes/cologne, or deodorant                          Men may shave face and neck.   Do not bring valuables to the hospital. [place].   Contacts, dentures or bridgework may not be worn into surgery.   Bring small overnight bag day of surgery.    Special Instructions: Bring a copy of your healthcare power of attorney and living will documents         the day of surgery if you haven't scanned them in before.              Please read over the following fact sheets you were given:  [facility] - Preparing for Surgery Before surgery, you can play an important role.  Because skin is not sterile, your skin needs to be as free of germs as possible.  You can reduce the number of germs on your skin by washing with CHG (chlorahexidine gluconate) soap before surgery.  CHG is an antiseptic cleaner which kills  germs and bonds with the skin to continue killing germs even after washing. Please DO NOT use if you have an allergy to CHG or antibacterial soaps.  If your skin becomes reddened/irritated stop using the CHG and inform your nurse when you arrive at Short Stay. Do not shave (including legs and underarms) for at least 48 hours prior to the first CHG shower.  You may shave your face/neck.  Please follow these instructions carefully:  1.  Shower with CHG Soap the night before surgery and the  morning of surgery.  2.  If you choose to wash your hair, wash your hair first as usual with your normal  shampoo.  3.  After you shampoo, rinse your hair and body thoroughly to remove the shampoo.  4.  Use CHG as you would any other liquid soap.  You can apply chg directly to the skin and wash.  Gently with a scrungie or clean washcloth.  5.  Apply the CHG Soap to your body ONLY FROM THE NECK DOWN.   Do   not use on face/ open                           Wound or open sores. Avoid contact with eyes, ears mouth and   genitals (private parts).                       Wash face,  Genitals (private parts) with your normal soap.             6.  Wash thoroughly, paying special attention to the area where your    surgery  will be performed.  7.  Thoroughly rinse your body with warm water from the neck down.  8.  DO NOT shower/wash with your normal soap after using and rinsing off the CHG Soap.                9.  Pat yourself dry with a clean towel.            10.  Wear clean pajamas.            11.  Place clean sheets on your bed the night of your first shower and do not  sleep with pets. Day of Surgery : Do not apply any lotions/deodorants the morning of surgery.  Please wear clean clothes to the hospital/surgery center.  FAILURE TO FOLLOW THESE INSTRUCTIONS MAY RESULT IN THE CANCELLATION OF YOUR SURGERY  PATIENT SIGNATURE_________________________________  NURSE  SIGNATURE__________________________________  ________________________________________________________________________

## 2019-05-28 ENCOUNTER — Encounter (HOSPITAL_COMMUNITY)
Admission: RE | Admit: 2019-05-28 | Discharge: 2019-05-28 | Disposition: A | Discharge status: Home / Self Care | Payer: Medicare Other | Source: Ambulatory Visit | Attending: Interventional Radiology | Admitting: Interventional Radiology

## 2019-05-28 ENCOUNTER — Encounter (HOSPITAL_COMMUNITY): Payer: Self-pay

## 2019-05-28 ENCOUNTER — Other Ambulatory Visit: Payer: Self-pay

## 2019-05-28 DIAGNOSIS — Z01812 Encounter for preprocedural laboratory examination: Secondary | ICD-10-CM | POA: Diagnosis not present

## 2019-05-28 HISTORY — DX: Unspecified dementia, unspecified severity, without behavioral disturbance, psychotic disturbance, mood disturbance, and anxiety: F03.90

## 2019-05-28 HISTORY — DX: Malignant neoplasm of prostate: C61

## 2019-05-28 HISTORY — DX: Unspecified fracture of right lower leg, initial encounter for closed fracture: S82.91XA

## 2019-05-28 HISTORY — DX: Other specified disorders of kidney and ureter: N28.89

## 2019-05-28 HISTORY — DX: Personal history of urinary calculi: Z87.442

## 2019-05-28 LAB — BASIC METABOLIC PANEL
Anion gap: 9 (ref 5–15)
BUN: 26 mg/dL — ABNORMAL HIGH (ref 8–23)
CO2: 23 mmol/L (ref 22–32)
Calcium: 9.6 mg/dL (ref 8.9–10.3)
Chloride: 106 mmol/L (ref 98–111)
Creatinine, Ser: 0.86 mg/dL (ref 0.61–1.24)
GFR calc Af Amer: >60 mL/min (ref >60)
GFR calc non Af Amer: >60 mL/min (ref >60)
Glucose, Bld: 212 mg/dL — ABNORMAL HIGH (ref 70–99)
Potassium: 4.6 mmol/L (ref 3.5–5.1)
Sodium: 138 mmol/L (ref 135–145)

## 2019-05-28 LAB — CBC
HCT: 39.6 % (ref 39.0–52.0)
Hemoglobin: 12.9 g/dL — ABNORMAL LOW (ref 13.0–17.0)
MCH: 29 pg (ref 26.0–34.0)
MCHC: 32.6 g/dL (ref 30.0–36.0)
MCV: 89 fL (ref 80.0–100.0)
Platelets: 174 10*3/uL (ref 150–400)
RBC: 4.45 MIL/uL (ref 4.22–5.81)
RDW: 16.1 % — ABNORMAL HIGH (ref 11.5–15.5)
WBC: 8.1 10*3/uL (ref 4.0–10.5)
nRBC: 0 % (ref 0.0–0.2)

## 2019-05-28 LAB — HEMOGLOBIN A1C
Hgb A1c MFr Bld: 7.5 % — ABNORMAL HIGH (ref 4.8–5.6)
Mean Plasma Glucose: 168.55 mg/dL

## 2019-05-28 NOTE — Progress Notes (Signed)
PCP - Dr. Wolfgang Phoenix Cardiologist - Dr. Laurelyn Sickle  Chest x-ray - 11/01/18 in epic EKG - 08/25/18 in epic Stress Test - greater than 2 years ECHO - greater than 2 years Cardiac Cath - greater than 2 years  Sleep Study - Yes CPAP - does not have a CPAP, patient denies he has OSA  Fasting Blood Sugar - does not check at home Checks Blood Sugar _N/A___ times a day  Blood Thinner Instructions: N/A Aspirin Instructions:  per patient no instructions have been given about stopping Aspirin Last Dose:  Anesthesia review: N/A  Patient denies shortness of breath, fever, cough and chest pain at PAT appointment   Patient verbalized understanding of instructions that were given to them at the PAT appointment. Patient was also instructed that they will need to review over the PAT instructions again at home before surgery.

## 2019-06-01 ENCOUNTER — Other Ambulatory Visit (HOSPITAL_COMMUNITY): Payer: 59

## 2019-06-03 ENCOUNTER — Other Ambulatory Visit: Payer: Self-pay | Admitting: Radiology

## 2019-06-04 ENCOUNTER — Other Ambulatory Visit: Payer: Self-pay | Admitting: Physician Assistant

## 2019-06-04 NOTE — Anesthesia Preprocedure Evaluation (Addendum)
Anesthesia Evaluation  Patient identified by MRN, date of birth, ID band Patient awake    Reviewed: Allergy & Precautions, NPO status , Patient's Chart, lab work & pertinent test results  History of Anesthesia Complications Negative for: history of anesthetic complications  Airway Mallampati: I  TM Distance: >3 FB Neck ROM: Full    Dental  (+) Upper Dentures, Chipped, Dental Advisory Given   Pulmonary sleep apnea (does not use CPAP) , COPD,  COPD inhaler, former smoker,   Daily neb   + wheezing      Cardiovascular hypertension, Pt. on medications (-) angina+ CAD and + Cardiac Stents ('06 LAD stent patent, no other sig ASCAD)   Rhythm:Regular Rate:Normal     Neuro/Psych Depression Neuropathy Essential tremor    GI/Hepatic Neg liver ROS, GERD  Medicated and Controlled,  Endo/Other  diabetes (no meds presently, glu 155)  Renal/GU Renal InsufficiencyRenal disease     Musculoskeletal  (+) Arthritis ,   Abdominal   Peds  Hematology   Anesthesia Other Findings   Reproductive/Obstetrics                            Anesthesia Physical Anesthesia Plan  ASA: III  Anesthesia Plan: General   Post-op Pain Management:    Induction: Intravenous  PONV Risk Score and Plan: 2 and Treatment may vary due to age or medical condition, Ondansetron and Dexamethasone  Airway Management Planned: Oral ETT  Additional Equipment:   Intra-op Plan:   Post-operative Plan: Extubation in OR  Informed Consent: I have reviewed the patients History and Physical, chart, labs and discussed the procedure including the risks, benefits and alternatives for the proposed anesthesia with the patient or authorized representative who has indicated his/her understanding and acceptance.     Dental advisory given  Plan Discussed with: CRNA and Surgeon  Anesthesia Plan Comments:         Anesthesia Quick  Evaluation

## 2019-06-05 ENCOUNTER — Encounter (HOSPITAL_COMMUNITY)
Admission: RE | Disposition: A | Discharge status: Home / Self Care | Payer: Self-pay | Source: Ambulatory Visit | Attending: Interventional Radiology

## 2019-06-05 ENCOUNTER — Ambulatory Visit (HOSPITAL_COMMUNITY)
Admission: RE | Admit: 2019-06-05 | Discharge: 2019-06-05 | Disposition: A | Discharge status: Home / Self Care | Payer: Medicare Other | Source: Ambulatory Visit | Attending: Interventional Radiology | Admitting: Interventional Radiology

## 2019-06-05 ENCOUNTER — Observation Stay (HOSPITAL_COMMUNITY)
Admission: RE | Admit: 2019-06-05 | Discharge: 2019-06-06 | Disposition: A | Discharge status: Home / Self Care | Payer: Medicare Other | Source: Ambulatory Visit | Attending: Interventional Radiology | Admitting: Interventional Radiology

## 2019-06-05 ENCOUNTER — Encounter (HOSPITAL_COMMUNITY): Payer: Self-pay | Admitting: Interventional Radiology

## 2019-06-05 ENCOUNTER — Ambulatory Visit (HOSPITAL_COMMUNITY): Payer: Medicare Other | Admitting: Certified Registered Nurse Anesthetist

## 2019-06-05 ENCOUNTER — Ambulatory Visit (HOSPITAL_COMMUNITY): Payer: Medicare Other

## 2019-06-05 ENCOUNTER — Other Ambulatory Visit: Payer: Self-pay

## 2019-06-05 DIAGNOSIS — J449 Chronic obstructive pulmonary disease, unspecified: Secondary | ICD-10-CM | POA: Diagnosis not present

## 2019-06-05 DIAGNOSIS — Z01818 Encounter for other preprocedural examination: Secondary | ICD-10-CM

## 2019-06-05 DIAGNOSIS — Z87891 Personal history of nicotine dependence: Secondary | ICD-10-CM | POA: Diagnosis not present

## 2019-06-05 DIAGNOSIS — F329 Major depressive disorder, single episode, unspecified: Secondary | ICD-10-CM | POA: Diagnosis not present

## 2019-06-05 DIAGNOSIS — Z79899 Other long term (current) drug therapy: Secondary | ICD-10-CM | POA: Insufficient documentation

## 2019-06-05 DIAGNOSIS — Z7982 Long term (current) use of aspirin: Secondary | ICD-10-CM | POA: Diagnosis not present

## 2019-06-05 DIAGNOSIS — I10 Essential (primary) hypertension: Secondary | ICD-10-CM | POA: Insufficient documentation

## 2019-06-05 DIAGNOSIS — N400 Enlarged prostate without lower urinary tract symptoms: Secondary | ICD-10-CM | POA: Insufficient documentation

## 2019-06-05 DIAGNOSIS — G4730 Sleep apnea, unspecified: Secondary | ICD-10-CM | POA: Insufficient documentation

## 2019-06-05 DIAGNOSIS — I2510 Atherosclerotic heart disease of native coronary artery without angina pectoris: Secondary | ICD-10-CM | POA: Diagnosis not present

## 2019-06-05 DIAGNOSIS — K219 Gastro-esophageal reflux disease without esophagitis: Secondary | ICD-10-CM | POA: Diagnosis not present

## 2019-06-05 DIAGNOSIS — C642 Malignant neoplasm of left kidney, except renal pelvis: Principal | ICD-10-CM | POA: Insufficient documentation

## 2019-06-05 DIAGNOSIS — Z87442 Personal history of urinary calculi: Secondary | ICD-10-CM | POA: Insufficient documentation

## 2019-06-05 DIAGNOSIS — Z8546 Personal history of malignant neoplasm of prostate: Secondary | ICD-10-CM | POA: Diagnosis not present

## 2019-06-05 DIAGNOSIS — F0390 Unspecified dementia without behavioral disturbance: Secondary | ICD-10-CM | POA: Insufficient documentation

## 2019-06-05 DIAGNOSIS — Z8616 Personal history of COVID-19: Secondary | ICD-10-CM | POA: Diagnosis not present

## 2019-06-05 DIAGNOSIS — E7800 Pure hypercholesterolemia, unspecified: Secondary | ICD-10-CM | POA: Insufficient documentation

## 2019-06-05 DIAGNOSIS — Z955 Presence of coronary angioplasty implant and graft: Secondary | ICD-10-CM | POA: Diagnosis not present

## 2019-06-05 DIAGNOSIS — E1140 Type 2 diabetes mellitus with diabetic neuropathy, unspecified: Secondary | ICD-10-CM | POA: Diagnosis not present

## 2019-06-05 DIAGNOSIS — N2889 Other specified disorders of kidney and ureter: Secondary | ICD-10-CM | POA: Diagnosis present

## 2019-06-05 HISTORY — PX: RADIOFREQUENCY ABLATION: SHX2290

## 2019-06-05 LAB — GLUCOSE, CAPILLARY
Glucose-Capillary: 155 mg/dL — ABNORMAL HIGH (ref 70–99)
Glucose-Capillary: 203 mg/dL — ABNORMAL HIGH (ref 70–99)
Glucose-Capillary: 245 mg/dL — ABNORMAL HIGH (ref 70–99)

## 2019-06-05 LAB — CBC WITH DIFFERENTIAL/PLATELET
Abs Immature Granulocytes: 0.01 10*3/uL (ref 0.00–0.07)
Basophils Absolute: 0.1 10*3/uL (ref 0.0–0.1)
Basophils Relative: 1 %
Eosinophils Absolute: 0.7 10*3/uL — ABNORMAL HIGH (ref 0.0–0.5)
Eosinophils Relative: 11 %
HCT: 39.6 % (ref 39.0–52.0)
Hemoglobin: 13.3 g/dL (ref 13.0–17.0)
Immature Granulocytes: 0 %
Lymphocytes Relative: 28 %
Lymphs Abs: 1.7 10*3/uL (ref 0.7–4.0)
MCH: 29.4 pg (ref 26.0–34.0)
MCHC: 33.6 g/dL (ref 30.0–36.0)
MCV: 87.6 fL (ref 80.0–100.0)
Monocytes Absolute: 0.4 10*3/uL (ref 0.1–1.0)
Monocytes Relative: 7 %
Neutro Abs: 3.2 10*3/uL (ref 1.7–7.7)
Neutrophils Relative %: 53 %
Platelets: 168 10*3/uL (ref 150–400)
RBC: 4.52 MIL/uL (ref 4.22–5.81)
RDW: 15.2 % (ref 11.5–15.5)
WBC: 6.1 10*3/uL (ref 4.0–10.5)
nRBC: 0 % (ref 0.0–0.2)

## 2019-06-05 LAB — BASIC METABOLIC PANEL
Anion gap: 11 (ref 5–15)
BUN: 21 mg/dL (ref 8–23)
CO2: 24 mmol/L (ref 22–32)
Calcium: 9.9 mg/dL (ref 8.9–10.3)
Chloride: 101 mmol/L (ref 98–111)
Creatinine, Ser: 0.97 mg/dL (ref 0.61–1.24)
GFR calc Af Amer: >60 mL/min (ref >60)
GFR calc non Af Amer: >60 mL/min (ref >60)
Glucose, Bld: 161 mg/dL — ABNORMAL HIGH (ref 70–99)
Potassium: 4.7 mmol/L (ref 3.5–5.1)
Sodium: 136 mmol/L (ref 135–145)

## 2019-06-05 LAB — PROTIME-INR
INR: 0.9 (ref 0.8–1.2)
Prothrombin Time: 12.2 seconds (ref 11.4–15.2)

## 2019-06-05 SURGERY — RADIO FREQUENCY ABLATION
Anesthesia: General | Laterality: Left

## 2019-06-05 MED ORDER — PANTOPRAZOLE SODIUM 40 MG PO TBEC
40.0000 mg | DELAYED_RELEASE_TABLET | Freq: Every day | ORAL | Status: DC
  Administered 2019-06-05 – 2019-06-06 (×2): 40 mg via ORAL
  Filled 2019-06-05 (×2): qty 1

## 2019-06-05 MED ORDER — CITALOPRAM HYDROBROMIDE 20 MG PO TABS
10.0000 mg | ORAL_TABLET | Freq: Every day | ORAL | Status: DC
  Administered 2019-06-05: 10 mg via ORAL
  Filled 2019-06-05: qty 1

## 2019-06-05 MED ORDER — FENTANYL CITRATE (PF) 100 MCG/2ML IJ SOLN
INTRAMUSCULAR | Status: AC
  Filled 2019-06-05: qty 4

## 2019-06-05 MED ORDER — IOHEXOL 350 MG/ML SOLN
100.0000 mL | Freq: Once | INTRAVENOUS | Status: AC | PRN
  Administered 2019-06-05: 80 mL via INTRAVENOUS

## 2019-06-05 MED ORDER — DEXAMETHASONE SODIUM PHOSPHATE 4 MG/ML IJ SOLN
INTRAMUSCULAR | Status: DC | PRN
  Administered 2019-06-05: 5 mg via INTRAVENOUS

## 2019-06-05 MED ORDER — PROPOFOL 10 MG/ML IV BOLUS
INTRAVENOUS | Status: DC | PRN
  Administered 2019-06-05: 80 mg via INTRAVENOUS

## 2019-06-05 MED ORDER — IPRATROPIUM-ALBUTEROL 0.5-2.5 (3) MG/3ML IN SOLN: 3.0000 mL | Freq: Four times a day (QID) | RESPIRATORY_TRACT | Status: DC | PRN

## 2019-06-05 MED ORDER — CEFAZOLIN SODIUM-DEXTROSE 2-4 GM/100ML-% IV SOLN
2.0000 g | INTRAVENOUS | Status: AC
  Administered 2019-06-05: 2 g via INTRAVENOUS

## 2019-06-05 MED ORDER — PHENYLEPHRINE 40 MCG/ML (10ML) SYRINGE FOR IV PUSH (FOR BLOOD PRESSURE SUPPORT)
PREFILLED_SYRINGE | INTRAVENOUS | Status: DC | PRN
  Administered 2019-06-05: 20 ug via INTRAVENOUS
  Administered 2019-06-05: 40 ug via INTRAVENOUS
  Administered 2019-06-05 (×2): 80 ug via INTRAVENOUS

## 2019-06-05 MED ORDER — TAMSULOSIN HCL 0.4 MG PO CAPS
0.4000 mg | ORAL_CAPSULE | Freq: Every day | ORAL | Status: DC
  Administered 2019-06-05 – 2019-06-06 (×2): 0.4 mg via ORAL
  Filled 2019-06-05 (×2): qty 1

## 2019-06-05 MED ORDER — HYDROCODONE-ACETAMINOPHEN 5-325 MG PO TABS
1.0000 | ORAL_TABLET | ORAL | Status: DC | PRN
  Administered 2019-06-05: 1 via ORAL
  Filled 2019-06-05: qty 1

## 2019-06-05 MED ORDER — ALBUTEROL SULFATE HFA 108 (90 BASE) MCG/ACT IN AERS
INHALATION_SPRAY | RESPIRATORY_TRACT | Status: AC
  Filled 2019-06-05: qty 6.7

## 2019-06-05 MED ORDER — SODIUM CHLORIDE 0.9 % IV SOLN
INTRAVENOUS | Status: AC
  Filled 2019-06-05: qty 250

## 2019-06-05 MED ORDER — AMLODIPINE BESYLATE 10 MG PO TABS
10.0000 mg | ORAL_TABLET | Freq: Every day | ORAL | Status: DC
  Administered 2019-06-05 – 2019-06-06 (×2): 10 mg via ORAL
  Filled 2019-06-05 (×2): qty 1

## 2019-06-05 MED ORDER — ONDANSETRON HCL 4 MG/2ML IJ SOLN: 4.0000 mg | Freq: Four times a day (QID) | INTRAMUSCULAR | Status: DC | PRN

## 2019-06-05 MED ORDER — CEFAZOLIN SODIUM-DEXTROSE 2-4 GM/100ML-% IV SOLN
INTRAVENOUS | Status: AC
  Filled 2019-06-05: qty 100

## 2019-06-05 MED ORDER — FENTANYL CITRATE (PF) 100 MCG/2ML IJ SOLN
INTRAMUSCULAR | Status: DC | PRN
  Administered 2019-06-05 (×4): 50 ug via INTRAVENOUS

## 2019-06-05 MED ORDER — FINASTERIDE 5 MG PO TABS
5.0000 mg | ORAL_TABLET | Freq: Every day | ORAL | Status: DC
  Administered 2019-06-05 – 2019-06-06 (×2): 5 mg via ORAL
  Filled 2019-06-05 (×2): qty 1

## 2019-06-05 MED ORDER — ROCURONIUM BROMIDE 10 MG/ML (PF) SYRINGE
PREFILLED_SYRINGE | INTRAVENOUS | Status: DC | PRN
  Administered 2019-06-05: 20 mg via INTRAVENOUS
  Administered 2019-06-05: 50 mg via INTRAVENOUS

## 2019-06-05 MED ORDER — SODIUM CHLORIDE (PF) 0.9 % IJ SOLN
INTRAMUSCULAR | Status: AC
  Filled 2019-06-05: qty 200

## 2019-06-05 MED ORDER — INSULIN ASPART 100 UNIT/ML ~~LOC~~ SOLN
0.0000 [IU] | Freq: Three times a day (TID) | SUBCUTANEOUS | Status: DC
  Administered 2019-06-05: 5 [IU] via SUBCUTANEOUS
  Administered 2019-06-06: 3 [IU] via SUBCUTANEOUS

## 2019-06-05 MED ORDER — SUGAMMADEX SODIUM 200 MG/2ML IV SOLN
INTRAVENOUS | Status: DC | PRN
  Administered 2019-06-05: 300 mg via INTRAVENOUS

## 2019-06-05 MED ORDER — LIDOCAINE 2% (20 MG/ML) 5 ML SYRINGE
INTRAMUSCULAR | Status: DC | PRN
  Administered 2019-06-05: 30 mg via INTRAVENOUS

## 2019-06-05 MED ORDER — ALBUTEROL SULFATE HFA 108 (90 BASE) MCG/ACT IN AERS
INHALATION_SPRAY | RESPIRATORY_TRACT | Status: DC | PRN
  Administered 2019-06-05: 2 via RESPIRATORY_TRACT

## 2019-06-05 MED ORDER — GABAPENTIN 300 MG PO CAPS
300.0000 mg | ORAL_CAPSULE | Freq: Two times a day (BID) | ORAL | Status: DC
  Administered 2019-06-05 – 2019-06-06 (×2): 300 mg via ORAL
  Filled 2019-06-05 (×2): qty 1

## 2019-06-05 MED ORDER — SODIUM CHLORIDE 0.9 % IV SOLN: INTRAVENOUS | Status: DC

## 2019-06-05 MED ORDER — FENTANYL CITRATE (PF) 100 MCG/2ML IJ SOLN: 25.0000 ug | INTRAMUSCULAR | Status: DC | PRN

## 2019-06-05 MED ORDER — ROSUVASTATIN CALCIUM 20 MG PO TABS
40.0000 mg | ORAL_TABLET | Freq: Every day | ORAL | Status: DC
  Administered 2019-06-05: 40 mg via ORAL
  Filled 2019-06-05: qty 2

## 2019-06-05 MED ORDER — DOCUSATE SODIUM 100 MG PO CAPS
100.0000 mg | ORAL_CAPSULE | Freq: Two times a day (BID) | ORAL | Status: DC
  Administered 2019-06-05: 100 mg via ORAL
  Filled 2019-06-05 (×2): qty 1

## 2019-06-05 MED ORDER — BENAZEPRIL HCL 10 MG PO TABS
40.0000 mg | ORAL_TABLET | Freq: Every day | ORAL | Status: DC
  Administered 2019-06-05: 40 mg via ORAL
  Filled 2019-06-05: qty 4

## 2019-06-05 MED ORDER — PHENYLEPHRINE HCL-NACL 10-0.9 MG/250ML-% IV SOLN
INTRAVENOUS | Status: DC | PRN
  Administered 2019-06-05: 50 ug/min via INTRAVENOUS

## 2019-06-05 MED ORDER — LACTATED RINGERS IV SOLN: INTRAVENOUS | Status: DC

## 2019-06-05 NOTE — Sedation Documentation (Signed)
Anesthesia in to sedate 

## 2019-06-05 NOTE — Progress Notes (Signed)
Foley catheter DC'd per MD order. Education provided to patient about pros and cons of urinary cath including infection prevention.

## 2019-06-05 NOTE — Progress Notes (Addendum)
  Patient ID: Todd Davidson, adult   DOB: 10/26/1935, 84 y.o.   MRN: [id number] Patient currently without complaints.  Specifically denies fever, respiratory issues, nausea, vomiting, significant flank pain.  Vital signs stable, afebrile, puncture site left flank soft, small amount of old blood on gauze, area nontender, no discrete hematoma.  Yellow urine in Foley bag.   A/P: Status post CT-guided biopsy and cryoablation of left renal mass earlier today; for overnight obs; DC Foley later today; virtual/tele IR clinic follow-up in 1 month, follow-up imaging in 4 months.  Check pathology result.

## 2019-06-05 NOTE — Procedures (Signed)
Interventional Radiology Procedure Note  Procedure: S/P CT LEFT RENAL MASS BX AND CRYO  Complications: None  Estimated Blood Loss: MIN  Findings: 18 G CORE BX OBTAINED 4 ICEROD NEEDLES TRIANGULATED THROUGHOUT THE LESION FOR SUCCESSFUL CRYO

## 2019-06-05 NOTE — H&P (Signed)
Referring Physician(s): Camillia Herter  Supervising Physician: Daryll Brod  Patient Status:  [facility] OP TBA  Chief Complaint:  Left renal mass  Subjective: Patient familiar to IR service from telephone consultations with Dr. Annamaria Boots on 02/28/2019 and 04/30/2019 to discuss treatment options for a 3.5 cm left renal mass.  He was deemed an appropriate candidate for CT-guided biopsy and cryoablation of the renal mass and presents today for the procedure.  Past medical history significant for prostate cancer with prior radiation, coronary artery disease, dementia, diabetes, hypertension, sleep apnea, recurrent fractures of a fused right knee and prior Covid 19 infection in December of last year. He currently denies fever, headache, chest pain, dyspnea, cough, abdominal/back pain, nausea, vomiting or bleeding.  He does have some chronic neck pain.  Past Medical History:  Diagnosis Date  . Arthritis    neck, shoulders, hips, knees  . Asthma    as child, out grew  . Barrett esophagus   . BPH (benign prostatic hyperplasia)   . Coronary artery disease    annual stress with Dr. Humphrey Rolls. no new findings  . Coronary artery disease   . Dementia ([place])   . Depression   . Diabetes mellitus without complication (HCC)    no medications currently was on insulin but was dropping blood sugars too low causing syncope  . Diverticulosis   . Duodenitis   . Gastritis   . GERD (gastroesophageal reflux disease)   . History of COVID-19 02/2019  . History of kidney stones   . Hypercholesteremia   . Hypertension   . Left renal mass   . Leg fracture, right    wears brace  . Neuromuscular disorder (HCC)    neuropathy - feet  . Occasional tremors    hands  . Prostate cancer ([place])   . Renal insufficiency   . Sleep apnea    doesn't wear CPAP, pt denies  . Wears dentures    full upper   Past Surgical History:  Procedure Laterality Date  . APPENDECTOMY    . CARDIAC CATHETERIZATION  1999, 2000   stents placed    . CATARACT EXTRACTION W/PHACO Left 10/29/2014   Procedure: CATARACT EXTRACTION PHACO AND INTRAOCULAR LENS PLACEMENT (IOC);  Surgeon: Leandrew Koyanagi, MD;  Location: [place];  Service: Ophthalmology;  Laterality: Left;  DIABETIC - insulin MALYUGIN  . CERVICAL FUSION    . CORONARY ANGIOPLASTY    . ESOPHAGOGASTRODUODENOSCOPY N/A 12/29/2017   Procedure: ESOPHAGOGASTRODUODENOSCOPY (EGD);  Surgeon: [name], MD;  Location: [facility] ENDOSCOPY;  Service: Endoscopy;  Laterality: N/A;  . ESOPHAGOGASTRODUODENOSCOPY (EGD) WITH PROPOFOL N/A 12/17/2015   Procedure: ESOPHAGOGASTRODUODENOSCOPY (EGD) WITH PROPOFOL;  Surgeon: [name], MD;  Location: [facility] ENDOSCOPY;  Service: Endoscopy;  Laterality: N/A;  . EYE SURGERY Right   . HERNIA REPAIR     x2  . I & D EXTREMITY Right 10/31/2018   Procedure: IRRIGATION AND DEBRIDEMENT RIGHT KNEE  EXTERNAL FIXATION OF FRACTURE;  Surgeon: Shona Needles, MD;  Location: [place];  Service: Orthopedics;  Laterality: Right;  IRRIGATION AND DEBRIDEMENT RIGHT KNEE  EXTERNAL FIXATION OF FRACTURE  . IR RADIOLOGIST EVAL & MGMT  02/28/2019  . IR RADIOLOGIST EVAL & MGMT  04/30/2019  . IRRIGATION AND DEBRIDEMENT KNEE Right 10/31/2018  . JOINT REPLACEMENT     knee  . [place]   . KNEE SURGERY Right    joint removal with fusion and graft  . NISSEN FUNDOPLICATION  Allergies: Macrolides and ketolides, Erythromycin, Propranolol, Sulfa antibiotics, Sulfa antibiotics, Sulfasalazine, Tape, Tapentadol, Tapentadol, Telbivudine, and Tape  Medications: Prior to Admission medications   Medication Sig Start Date End Date Taking? Authorizing Provider  amLODipine (NORVASC) 10 MG tablet Take 10 mg by mouth daily. 09/04/18  Yes [provider]  aspirin 325 MG tablet Take 325 mg by mouth daily. AM   Yes [provider]  benazepril (LOTENSIN) 40 MG tablet Take 40 mg by mouth at bedtime.  09/04/18  Yes [provider]   citalopram (CELEXA) 10 MG tablet Take 10 mg by mouth at bedtime.  10/30/18  Yes [provider]  finasteride (PROSCAR) 5 MG tablet TAKE 1 TABLET EVERY DAY Patient taking differently: Take 5 mg by mouth at bedtime.  04/05/19  Yes Stoioff, Ronda Fairly, MD  gabapentin (NEURONTIN) 300 MG capsule Take 300 mg by mouth 2 (two) times daily.  09/27/18  Yes [provider]  ipratropium-albuterol (DUONEB) 0.5-2.5 (3) MG/3ML SOLN Take 3 mLs by nebulization every 6 (six) hours as needed. 11/05/18  Yes Rayburn, Claiborne Billings A, PA-C  nitroGLYCERIN (NITROSTAT) 0.4 MG SL tablet Place under the tongue. 06/05/12  Yes [provider]  omeprazole (PRILOSEC) 40 MG capsule Take 40 mg by mouth daily.    Yes [provider]  rosuvastatin (CRESTOR) 40 MG tablet Take 40 mg by mouth at bedtime.  09/18/18  Yes [provider]  tamsulosin (FLOMAX) 0.4 MG CAPS capsule TAKE 1 CAPSULE (0.4 MG TOTAL) BY MOUTH DAILY AFTER BREAKFAST. Patient taking differently: Take 0.4 mg by mouth daily.  04/05/19  Yes Stoioff, Ronda Fairly, MD  ACCU-CHEK AVIVA PLUS test strip USE 1 STRIP TO CHECK GLUCOSE 4 TIMES DAILY 08/13/17   [provider]  acetaminophen (TYLENOL) 325 MG tablet Take 2 tablets (650 mg total) by mouth every 6 (six) hours as needed for mild pain. 11/05/18   Rayburn, Floyce Stakes, PA-C  docusate sodium (COLACE) 100 MG capsule Take 1 capsule (100 mg total) by mouth 2 (two) times daily. Patient not taking: Reported on 05/23/2019 11/05/18   Rayburn, Claiborne Billings A, PA-C  heparin 5000 UNIT/ML injection Inject 1 mL (5,000 Units total) into the skin every 8 (eight) hours. Patient not taking: Reported on 05/23/2019 11/05/18   Rayburn, Claiborne Billings A, PA-C  methocarbamol (ROBAXIN) 500 MG tablet Take 1 tablet (500 mg total) by mouth every 8 (eight) hours as needed for muscle spasms. Patient not taking: Reported on 05/23/2019 11/05/18   Rayburn, Claiborne Billings A, PA-C  oxyCODONE (OXY IR/ROXICODONE) 5 MG immediate release tablet Take 1-2 tablets  (5-10 mg total) by mouth every 4 (four) hours as needed (5 mg for moderate; 10 mg for severe). Patient not taking: Reported on 05/23/2019 11/05/18   Rayburn, Floyce Stakes, PA-C     Vital Signs: BP 137/66   Pulse 85   Temp 98.4 F (36.9 C)   Resp 18   SpO2 96%   Physical Exam awake, in no acute distress, answers some simple questions without difficulty.  History of short-term memory loss is noted.  Chest clear to auscultation bilaterally.  Heart with regular rate and rhythm.  Abdomen soft, positive bowel sounds, nontender. Bilateral pretibial edema noted.  Imaging: No results found.  Labs:  CBC: Recent Labs    11/03/18 0458 11/04/18 0433 05/28/19 1050 06/05/19 0700  WBC 6.1 5.7 8.1 6.1  HGB 7.9* 8.0* 12.9* 13.3  HCT 23.3* 23.5* 39.6 39.6  PLT 65* 66* 174 168    COAGS: Recent Labs  10/31/18 0939 06/05/19 0700  INR 1.0 0.9    BMP: Recent Labs    11/01/18 0417 11/01/18 0417 11/03/18 0458 04/09/19 0930 05/28/19 1050 06/05/19 0700  NA 138  --  138  --  138 136  K 4.8  --  4.1  --  4.6 4.7  CL 108  --  108  --  106 101  CO2 22  --  23  --  23 24  GLUCOSE 206*  --  138*  --  212* 161*  BUN 26*  --  15  --  26* 21  CALCIUM 8.2*  --  8.3*  --  9.6 9.9  CREATININE 1.01   < > 0.85 1.00 0.86 0.97  GFRNONAA >60  --  >60  --  >60 >60  GFRAA >60  --  >60  --  >60 >60   < > = values in this interval not displayed.    LIVER FUNCTION TESTS: Recent Labs    08/25/18 1951 10/31/18 0939 11/01/18 0417  BILITOT 0.5 0.5 0.5  AST 30 88* 45*  ALT 25 69* 42  ALKPHOS 92 67 49  PROT 7.4 5.8* 5.0*  ALBUMIN 4.3 3.5 3.2*    Assessment and Plan: 84 yo WM with past medical history significant for prostate cancer with prior radiation, coronary artery disease, dementia, diabetes, hypertension, sleep apnea, recurrent fractures of a fused right knee and prior Covid 19 infection in December of last year.  He was evaluated recently by Dr. Annamaria Boots for treatment options regarding a 3.5 cm  left renal mass suspicious for renal cell carcinoma.  He was deemed an appropriate candidate for CT-guided biopsy and cryoablation of the renal mass and presents today for the procedure.  Details/risks of procedure, including but not limited to, internal bleeding, infection, injury to adjacent structures, anesthesia related complications discussed with patient and patient's brother Todd Davidson with their understanding and consent.  Post procedure he will be admitted for overnight observation.This procedure involves the use of CT and because of the nature of the planned procedure, it is possible that we will have prolonged use of CT. Potential radiation risks to you include (but are not limited to) the following: - A slightly elevated risk for cancer  several years later in life. This risk is typically less than 0.5% percent. This risk is low in comparison to the normal incidence of human cancer, which is 33% for women and 50% for men according to the [place]. - Radiation induced injury can include skin redness, resembling a rash, tissue breakdown / ulcers and hair loss (which can be temporary or permanent).   The likelihood of either of these occurring depends on the difficulty of the procedure and whether you are sensitive to radiation due to previous procedures, disease, or genetic conditions.   IF your procedure requires a prolonged use of radiation, you will be notified and given written instructions for further action.  It is your responsibility to monitor the irradiated area for the 2 weeks following the procedure and to notify your physician if you are concerned that you have suffered a radiation induced injury.      Electronically Signed: D. Rowe Robert, PA-C 06/05/2019, 8:01 AM   I spent a total of 30 minutes at the the patient's bedside AND on the patient's hospital floor or unit, greater than 50% of which was counseling/coordinating care for CT-guided biopsy and  cryoablation of left renal mass

## 2019-06-05 NOTE — Anesthesia Procedure Notes (Signed)
Procedure Name: Intubation Date/Time: 06/05/2019 8:38 AM Performed by: Claudia Desanctis, CRNA Pre-anesthesia Checklist: Patient identified, Emergency Drugs available, Suction available and Patient being monitored Patient Re-evaluated:Patient Re-evaluated prior to induction Oxygen Delivery Method: Circle system utilized Preoxygenation: Pre-oxygenation with 100% oxygen Induction Type: IV induction Ventilation: Mask ventilation without difficulty Laryngoscope Size: 2 and Miller Grade View: Grade I Tube type: Oral Tube size: 7.5 mm Number of attempts: 1 Airway Equipment and Method: Stylet Placement Confirmation: ETT inserted through vocal cords under direct vision,  positive ETCO2 and breath sounds checked- equal and bilateral Tube secured with: Tape Dental Injury: Teeth and Oropharynx as per pre-operative assessment

## 2019-06-05 NOTE — Anesthesia Postprocedure Evaluation (Signed)
Anesthesia Post Note  Patient: Todd Davidson  Procedure(s) Performed: LEFT RENAL  CRYO ABLATION (Left )     Patient location during evaluation: PACU Anesthesia Type: General Level of consciousness: awake and alert, patient cooperative and oriented Pain management: pain level controlled Vital Signs Assessment: post-procedure vital signs reviewed and stable Respiratory status: spontaneous breathing, nonlabored ventilation and respiratory function stable Cardiovascular status: blood pressure returned to baseline and stable Postop Assessment: no apparent nausea or vomiting Anesthetic complications: no    Last Vitals:  Vitals:   06/05/19 1145 06/05/19 1200  BP: (!) 146/77 (!) 165/78  Pulse: 75 80  Resp: 17 13  Temp:  36.5 C  SpO2: 95% 91%    Last Pain:  Vitals:   06/05/19 1145  PainSc: 0-No pain                 Kallee Nam,E. Roselene Gray

## 2019-06-05 NOTE — Transfer of Care (Signed)
Immediate Anesthesia Transfer of Care Note  Patient: Todd Davidson  Procedure(s) Performed: LEFT RENAL  CRYO ABLATION (Left )  Patient Location: PACU  Anesthesia Type:General  Level of Consciousness: awake, alert , oriented and patient cooperative  Airway & Oxygen Therapy: Patient Spontanous Breathing and Patient connected to face mask  Post-op Assessment: Report given to RN and Post -op Vital signs reviewed and stable  Post vital signs: Reviewed and stable  Last Vitals:  Vitals Value Taken Time  BP    Temp    Pulse 96 06/05/19 1112  Resp 26 06/05/19 1112  SpO2 99 % 06/05/19 1112  Vitals shown include unvalidated device data.  Last Pain: There were no vitals filed for this visit.       Complications: No apparent anesthesia complications

## 2019-06-06 ENCOUNTER — Encounter: Payer: Self-pay | Admitting: *Deleted

## 2019-06-06 DIAGNOSIS — C642 Malignant neoplasm of left kidney, except renal pelvis: Secondary | ICD-10-CM | POA: Diagnosis not present

## 2019-06-06 LAB — GLUCOSE, CAPILLARY: Glucose-Capillary: 186 mg/dL — ABNORMAL HIGH (ref 70–99)

## 2019-06-06 LAB — SURGICAL PATHOLOGY

## 2019-06-06 NOTE — Discharge Summary (Signed)
Patient ID: Todd Davidson MRN: [id number] DOB/AGE: 82-Jul-1937 82 y.o.  Admit date: 06/05/2019 Discharge date: 06/06/2019  Supervising Physician: Arne Cleveland  Patient Status: [facility] - In-pt  Admission Diagnoses: Renal mass, left  Discharge Diagnoses:  Active Problems:   Renal mass, left   Discharged Condition: stable  Hospital Course:   Patient presented to [facility] 06/05/2019 for a CT-guided biopsy and cryoablation of left renal mass by Dr. Annamaria Boots. Procedure occurred without major complications and patient was transferred to floor in stable condition (VSS, left flank incisions stable) for overnight observation. Foley was removed last evening, no complications. No major events occurred overnight.  Patient awake and alert sitting in bed eating breakfast with no complaints at this time. Denies flank pain or hematuria. Left flank incisions stable. Plan to discharge home today and follow-up with Dr. Annamaria Boots for televisit 4 weeks after discharge.  Called patient's brother, [name], at patient's request to discuss discharge instructions. All questions answered and concerns addressed.   Consults: None  Significant Diagnostic Studies: DG Chest 1 View  Result Date: 06/05/2019 CLINICAL DATA:  Pre-op respiratory exam for left renal mass cryoablation. EXAM: CHEST  1 VIEW COMPARISON:  11/01/2018 FINDINGS: Patient is partially rotated to the left. The heart size and mediastinal contours are within normal limits. Both lungs are clear. The visualized skeletal structures are unremarkable. IMPRESSION: No active disease. Electronically Signed   By: Marlaine Hind M.D.   On: 06/05/2019 09:52   CT GUIDE TISSUE ABLATION  Result Date: 06/05/2019 INDICATION: 3.5 cm solid enhancing left renal mass concerning for renal cell carcinoma by imaging. Slight interval enlargement by surveillance imaging. EXAM: CT-GUIDED LEFT RENAL MASS CORE BIOPSY CT-GUIDED LEFT RENAL MASS CRYOABLATION COMPARISON:  04/09/2019  MEDICATIONS: ANCEF 2 G; The antibiotic was administered in an appropriate time interval prior to needle puncture of the skin. ANESTHESIA/SEDATION: General - as administered by the Anesthesia department Moderate Sedation Time:  NONE. The patient was continuously monitored during the procedure by the interventional radiology nurse under my direct supervision. FLUOROSCOPY TIME:  Fluoroscopy Time: NONE. COMPLICATIONS: None immediate. TECHNIQUE: Informed written consent was obtained from the patient's brother after a thorough discussion of the procedural risks, benefits and alternatives. All questions were addressed. Maximal Sterile Barrier Technique was utilized including caps, mask, sterile gowns, sterile gloves, sterile drape, hand hygiene and skin antiseptic. A timeout was performed prior to the initiation of the procedure. Previous imaging reviewed. After induction of general anesthesia, the patient was positioned prone. Noncontrast localization CT performed. The left upper pole medial exophytic renal mass was localized and marked externally. Under CT guidance, 4 ice rod cryo ablation needles were positioned within the lesion. One needle positioned in the superior aspect, 2 needles positioned centrally within the lesion and a fourth needle in the inferior aspect of the lesion. Needle adjustments were made with CT. Needle positions finally confirmed all within the lesion and separated by approximately 1 cm. Biopsy: Next, the 63 gauge coaxial guide needle was advanced to the lesion. 18 gauge core biopsy obtained under CT guidance. Sample was intact and non fragmented. This is placed in formalin. Prior to ablation, the needles were recheck with CT confirming adequate position. needle assessment confirms hydro dissection is not necessary. Ablation: 10 minute cryo ablation performed through the 4 ice rod cryo ablation needles. Surveillance CT during the procedure demonstrates adequate ice ball formation and coverage of  the lesion. 80 cc contrast was administered intravenous to better visualize the ice ball formation and lesion coverage. Initial  10 minutes ablation was followed by passive thaw for 6 minutes. A second 10 minutes ablation was performed. CT monitoring again confirms adequate ice ball coverage. Following the second treatment active thaw performed for 5 minutes followed by 30 second cautery. Needles removed. Postprocedure imaging demonstrates trace retroperitoneal hemorrhage posteriorly. No large hematoma. Overall patient tolerated the procedure well. FINDINGS: Imaging confirms 4 needles placed within the medial upper pole 3.5 cm left renal mass for cryo ablation. 18 gauge biopsy also performed. IMPRESSION: Successful CT-guided biopsy of the left renal mass Successful CT guided cryoablation of the left renal mass. Electronically Signed   By: Jerilynn Mages.  Shick M.D.   On: 06/05/2019 12:14   CT BIOPSY  Result Date: 06/05/2019 INDICATION: 3.5 cm solid enhancing left renal mass concerning for renal cell carcinoma by imaging. Slight interval enlargement by surveillance imaging. EXAM: CT-GUIDED LEFT RENAL MASS CORE BIOPSY CT-GUIDED LEFT RENAL MASS CRYOABLATION COMPARISON:  04/09/2019 MEDICATIONS: ANCEF 2 G; The antibiotic was administered in an appropriate time interval prior to needle puncture of the skin. ANESTHESIA/SEDATION: General - as administered by the Anesthesia department Moderate Sedation Time:  NONE. The patient was continuously monitored during the procedure by the interventional radiology nurse under my direct supervision. FLUOROSCOPY TIME:  Fluoroscopy Time: NONE. COMPLICATIONS: None immediate. TECHNIQUE: Informed written consent was obtained from the patient's brother after a thorough discussion of the procedural risks, benefits and alternatives. All questions were addressed. Maximal Sterile Barrier Technique was utilized including caps, mask, sterile gowns, sterile gloves, sterile drape, hand hygiene and skin  antiseptic. A timeout was performed prior to the initiation of the procedure. Previous imaging reviewed. After induction of general anesthesia, the patient was positioned prone. Noncontrast localization CT performed. The left upper pole medial exophytic renal mass was localized and marked externally. Under CT guidance, 4 ice rod cryo ablation needles were positioned within the lesion. One needle positioned in the superior aspect, 2 needles positioned centrally within the lesion and a fourth needle in the inferior aspect of the lesion. Needle adjustments were made with CT. Needle positions finally confirmed all within the lesion and separated by approximately 1 cm. Biopsy: Next, the 79 gauge coaxial guide needle was advanced to the lesion. 18 gauge core biopsy obtained under CT guidance. Sample was intact and non fragmented. This is placed in formalin. Prior to ablation, the needles were recheck with CT confirming adequate position. needle assessment confirms hydro dissection is not necessary. Ablation: 10 minute cryo ablation performed through the 4 ice rod cryo ablation needles. Surveillance CT during the procedure demonstrates adequate ice ball formation and coverage of the lesion. 80 cc contrast was administered intravenous to better visualize the ice ball formation and lesion coverage. Initial 10 minutes ablation was followed by passive thaw for 6 minutes. A second 10 minutes ablation was performed. CT monitoring again confirms adequate ice ball coverage. Following the second treatment active thaw performed for 5 minutes followed by 30 second cautery. Needles removed. Postprocedure imaging demonstrates trace retroperitoneal hemorrhage posteriorly. No large hematoma. Overall patient tolerated the procedure well. FINDINGS: Imaging confirms 4 needles placed within the medial upper pole 3.5 cm left renal mass for cryo ablation. 18 gauge biopsy also performed. IMPRESSION: Successful CT-guided biopsy of the left renal  mass Successful CT guided cryoablation of the left renal mass. Electronically Signed   By: Jerilynn Mages.  Shick M.D.   On: 06/05/2019 12:14    Treatments: CT-guided cryoablation of left renal mass  Discharge Exam: Blood pressure (!) 144/75, pulse 82, temperature 98.3  F (36.8 C), temperature source Oral, resp. rate 16, height 5\' 11"  (1.803 m), weight 143 lb 11.8 oz (65.2 kg), SpO2 93 %. Physical Exam Vitals and nursing note reviewed.  Constitutional:      General: He is not in acute distress.    Appearance: Normal appearance.  Cardiovascular:     Rate and Rhythm: Normal rate and regular rhythm.     Heart sounds: Normal heart sounds. No murmur.  Pulmonary:     Effort: Pulmonary effort is normal. No respiratory distress.     Breath sounds: Normal breath sounds. No wheezing.  Skin:    General: Skin is warm and dry.     Comments: Left flank incisions soft without tenderness, active bleeding, drainage, or hematoma.  Neurological:     Mental Status: He is alert and oriented to person, place, and time.     Disposition: Discharge disposition: 01-Home or Self Care       Discharge Instructions    Call MD for:  difficulty breathing, headache or visual disturbances   Complete by: As directed    Call MD for:  extreme fatigue   Complete by: As directed    Call MD for:  hives   Complete by: As directed    Call MD for:  persistant dizziness or light-headedness   Complete by: As directed    Call MD for:  persistant nausea and vomiting   Complete by: As directed    Call MD for:  redness, tenderness, or signs of infection (pain, swelling, redness, odor or green/yellow discharge around incision site)   Complete by: As directed    Call MD for:  severe uncontrolled pain   Complete by: As directed    Call MD for:  temperature >100.4   Complete by: As directed    Diet - low sodium heart healthy   Complete by: As directed    Discharge instructions   Complete by: As directed    Ok to shower 48  hours post-procedure. Recommend showering with bandage on, remove bandage after showering, and pat area clean. No further dressing changes needed after this. No submerging (bathing, swimming) for 7 days post-procedure.   Increase activity slowly   Complete by: As directed    Remove dressing in 24 hours   Complete by: As directed    Following shower (see discharge instructions above for further information).     Allergies as of 06/06/2019      Reactions   Macrolides And Ketolides Swelling   "-mycin" drugs "-mycin" drugs "-mycin" drugs   Erythromycin    Other reaction(s): Unknown   Propranolol    Sulfa Antibiotics Swelling, Other (See Comments)   Other reaction(s): Other (See Comments)   Sulfa Antibiotics Swelling   Sulfasalazine Swelling   Tape Other (See Comments)   Irritates the skin - raw   Tapentadol Other (See Comments)   Heavy tape causes raw skin   Tapentadol    Telbivudine Swelling   "mycin" drugs   Tape Other (See Comments), Rash   Heavy tape causes raw skin      Medication List    TAKE these medications   Accu-Chek Aviva Plus test strip Generic drug: glucose blood USE 1 STRIP TO CHECK GLUCOSE 4 TIMES DAILY   acetaminophen 325 MG tablet Commonly known as: TYLENOL Take 2 tablets (650 mg total) by mouth every 6 (six) hours as needed for mild pain.   amLODipine 10 MG tablet Commonly known as: NORVASC Take 10 mg by mouth  daily.   aspirin 325 MG tablet Take 325 mg by mouth daily. AM   benazepril 40 MG tablet Commonly known as: LOTENSIN Take 40 mg by mouth at bedtime.   citalopram 10 MG tablet Commonly known as: CELEXA Take 10 mg by mouth at bedtime.   docusate sodium 100 MG capsule Commonly known as: COLACE Take 1 capsule (100 mg total) by mouth 2 (two) times daily.   finasteride 5 MG tablet Commonly known as: PROSCAR TAKE 1 TABLET EVERY DAY What changed: when to take this   gabapentin 300 MG capsule Commonly known as: NEURONTIN Take 300 mg by  mouth 2 (two) times daily.   heparin 5000 UNIT/ML injection Inject 1 mL (5,000 Units total) into the skin every 8 (eight) hours.   ipratropium-albuterol 0.5-2.5 (3) MG/3ML Soln Commonly known as: DUONEB Take 3 mLs by nebulization every 6 (six) hours as needed.   methocarbamol 500 MG tablet Commonly known as: ROBAXIN Take 1 tablet (500 mg total) by mouth every 8 (eight) hours as needed for muscle spasms.   nitroGLYCERIN 0.4 MG SL tablet Commonly known as: NITROSTAT Place under the tongue.   omeprazole 40 MG capsule Commonly known as: PRILOSEC Take 40 mg by mouth daily.   oxyCODONE 5 MG immediate release tablet Commonly known as: Oxy IR/ROXICODONE Take 1-2 tablets (5-10 mg total) by mouth every 4 (four) hours as needed (5 mg for moderate; 10 mg for severe).   rosuvastatin 40 MG tablet Commonly known as: CRESTOR Take 40 mg by mouth at bedtime.   tamsulosin 0.4 MG Caps capsule Commonly known as: FLOMAX TAKE 1 CAPSULE (0.4 MG TOTAL) BY MOUTH DAILY AFTER BREAKFAST. What changed: See the new instructions.      Follow-up Information    Greggory Keen, MD Follow up in 4 week(s).   Specialties: Interventional Radiology, Radiology Why: Please follow-up with Dr. Annamaria Boots for televisit 4 weeks after discharge. Our office will call you to set up this appointment. Contact information: [address] [place] [address] [phone]            Electronically Signed: Earley Abide, PA-C 06/06/2019, 9:30 AM   I have spent Greater Than 30 Minutes discharging [place].

## 2019-06-06 NOTE — Plan of Care (Signed)

## 2019-06-06 NOTE — Progress Notes (Signed)
Pt VS WNL.  Discharge instructions provided to and reviewed with pt.  PIV discontinued.   Catheter intact and clotting within expected timeframe.  Pt transported to front entrance via wheelchair.

## 2019-06-06 NOTE — Discharge Instructions (Signed)
Cryoablation, Care After This sheet gives you information about how to care for yourself after your procedure. Your health care provider may also give you more specific instructions. If you have problems or questions, contact your health care provider. What can I expect after the procedure? After the procedure, it is common to have:  Soreness around the treatment area.  Mild pain and swelling in the treatment area. Follow these instructions at home: Treatment area care  Follow instructions from your health care provider about how to take care of your incision. Make sure you: ? Wash your hands with soap and water before you change your bandage (dressing). If soap and water are not available, use hand sanitizer. ? Ok to remove bandage following first shower (see discharge instructions for further information on this).  Check your treatment area every day for signs of infection. Check for: ? More redness, swelling, or pain. ? More fluid or blood. ? Warmth. ? Pus or a bad smell.  Keep the treated area clean and dry until it has healed. Activity  Follow instructions from your health care provider about any activity limitations.  Do not drive for 24 hours if you received a medicine to help you relax (sedative). General instructions  Keep all follow-up visits as told by your health care provider. This is important. Contact a health care provider if:  You do not have a bowel movement for 2 days.  You have nausea or vomiting.  You have more redness, swelling, or pain around your treatment area.  You have more fluid or blood coming from your treatment area.  Your treatment area feels warm to the touch.  You have pus or a bad smell coming from your treatment area.  You have a fever. Get help right away if:  You have severe pain.  You have trouble swallowing or breathing.  You have severe weakness or dizziness.  You have chest pain or shortness of breath. This information is  not intended to replace advice given to you by your health care provider. Make sure you discuss any questions you have with your health care provider. Document Revised: 02/17/2017 Document Reviewed: 08/05/2015 Elsevier Patient Education  [place].

## 2019-06-12 ENCOUNTER — Ambulatory Visit: Payer: Medicare Other | Attending: Internal Medicine

## 2019-06-12 DIAGNOSIS — Z23 Encounter for immunization: Secondary | ICD-10-CM

## 2019-06-12 NOTE — Progress Notes (Signed)
   Covid-19 Vaccination Clinic  Name:  [name]    MRN: [id number] DOB: 1935-05-29  06/12/2019  [name] was observed post Covid-19 immunization for 15 minutes without incident. He was provided with Vaccine Information Sheet and instruction to access the V-Safe system.   [name] was instructed to call 911 with any severe reactions post vaccine: Marland Kitchen Difficulty breathing  . Swelling of face and throat  . A fast heartbeat  . A bad rash all over body  . Dizziness and weakness   Immunizations Administered    Name Date Dose VIS Date Route   Pfizer COVID-19 Vaccine 06/12/2019 12:04 PM 0.3 mL 03/01/2019 Intramuscular   Manufacturer: [place]   Lot: [id number]   [place]: [id number]

## 2019-06-28 ENCOUNTER — Telehealth: Payer: Self-pay | Admitting: Adult Health Nurse Practitioner

## 2019-06-28 NOTE — Telephone Encounter (Signed)
Spoke with patient's brother.  He is having concerns that patient's dementia is worsening as he is having more episodes of forgetting what he has done.  Concerns that he is still driving with his worsening dementia.  Have set up appointment for 07/02/19 at 10 am Wayde Gopaul K. Olena Heckle NP

## 2019-07-02 ENCOUNTER — Other Ambulatory Visit: Payer: Self-pay

## 2019-07-02 ENCOUNTER — Other Ambulatory Visit: Payer: Medicare Other | Admitting: Adult Health Nurse Practitioner

## 2019-07-02 ENCOUNTER — Ambulatory Visit
Admission: RE | Admit: 2019-07-02 | Discharge: 2019-07-02 | Disposition: A | Discharge status: Home / Self Care | Payer: Medicare Other | Source: Ambulatory Visit | Attending: Student | Admitting: Student

## 2019-07-02 ENCOUNTER — Encounter: Payer: Self-pay | Admitting: *Deleted

## 2019-07-02 DIAGNOSIS — N2889 Other specified disorders of kidney and ureter: Secondary | ICD-10-CM

## 2019-07-02 DIAGNOSIS — Z515 Encounter for palliative care: Secondary | ICD-10-CM

## 2019-07-02 DIAGNOSIS — F0391 Unspecified dementia with behavioral disturbance: Secondary | ICD-10-CM

## 2019-07-02 HISTORY — PX: IR RADIOLOGIST EVAL & MGMT: IMG5224
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
## 2019-07-02 NOTE — Progress Notes (Signed)
[facility] Consult Note Telephone: [phone]  Fax: [phone]  PATIENT NAME: Todd Davidson DOB: 08/01/1935 MRN: [id number]  PRIMARY CARE PROVIDER:   Perrin Maltese, MD  REFERRING PROVIDER:  Evern Bio NP  RESPONSIBLE PARTY:   Delois Epp, brother [phone]       RECOMMENDATIONS and PLAN:  1.  Advanced care planning. Has advance directives uploaded in [place].  2.  Dementia.  Patient is able to walk with a walker. Denies falls. Patient does require occasional assistance with ADLs and is not able to perform IADLs.  Patient has continued to drive until recently.  Family has taken his car keys away.  Discussed with patient that this was best with concerns for increasing forgetfulness.  Family has concerns about his drinking beer and he has been found "out of it" with a beer in hand.  Have discussed with patient that with his dementia it would be best not to drink alcohol and to stay hydrated.  He does have episodes in which his blood sugar gets low and family reports that he will become belligerent and it can be difficult to get him something to eat or drink to bring his sugar up.  Patient does not have a history of diabetes.  He states his appetite is okay and he eats about 2 meals a day, this is not new.  Have encouraged to have some snacks in between meals and to drink plenty of fluids to keep his blood sugars more even and to stay hydrated.  Family has concerns with him staying by himself as patient does live alone and his brother stops by throughout the day and has someone who helps set up his meds and a lady who comes in to clean from time to time.  Have encouraged to have more supervision as patient still likes to drive his golf cart on his property that is over [place] acres.  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  Goal is to keep him in his home as long as possible.  Concerns that as his dementia progresses his  judgement will get worse.  Have reached out to SW to call brother with resources for help in the home.    3.  Right leg injury.  Patient has had cast remove and is now wearing a brace to right knee.  He denies pain and understands that he will have to wear this indefinitely.  Continue follow up and recommendations by ortho  Palliative will continue to monitor for symptom management/decline and make recommendations as needed. Will call in 6-8 weeks for follow up.  Brother encouraged to call with any concerns  I spent 80 minutes providing this consultation,  from 10:00 to 11:20  including time with patient/family, chart review, provider coordination, and documentation. More than 50% of the time in this consultation was spent coordinating communication.   HISTORY OF PRESENT ILLNESS:  Todd Davidson is a 84 y.o. year old adult with multiple medical problems including dementia, CAD, prostate cancer, renal mass, asthma, HTN, Barrett's esophagus. Palliative Care was asked to help address goals of care.   CODE STATUS: see above  PPS: 60% HOSPICE ELIGIBILITY/DIAGNOSIS: TBD  PHYSICAL EXAM:  BP  148/68  HR  65  O2 97% on RA General: NAD, frail appearing Cardiovascular: regular rate and rhythm Pulmonary: lung sounds clear; normal respiratory effort Extremities: trace edema to bilateral feet, brace to right knee Skin: no rashes on exposed  skin Neurological: Weakness;  Has forgetfulness.  PAST MEDICAL HISTORY:  Past Medical History:  Diagnosis Date  . Arthritis    neck, shoulders, hips, knees  . Asthma    as child, out grew  . Barrett esophagus   . BPH (benign prostatic hyperplasia)   . Coronary artery disease    annual stress with Dr. Humphrey Rolls. no new findings  . Coronary artery disease   . Dementia ([place])   . Depression   . Diabetes mellitus without complication (HCC)    no medications currently was on insulin but was dropping blood sugars too low causing syncope  . Diverticulosis   .  Duodenitis   . Gastritis   . GERD (gastroesophageal reflux disease)   . History of COVID-19 02/2019  . History of kidney stones   . Hypercholesteremia   . Hypertension   . Left renal mass   . Leg fracture, right    wears brace  . Neuromuscular disorder (HCC)    neuropathy - feet  . Occasional tremors    hands  . Prostate cancer ([place])   . Renal insufficiency   . Sleep apnea    doesn't wear CPAP, pt denies  . Wears dentures    full upper    SOCIAL HX:  Social History   Tobacco Use  . Smoking status: Former Smoker    Packs/day: 1.00    Years: 20.00    Pack years: 20.00    Types: Cigarettes, Pipe, Cigars    Quit date: 03/21/1973    Years since quitting: 46.3  . Smokeless tobacco: Never Used  Substance Use Topics  . Alcohol use: Yes    Alcohol/week: 2.0 standard drinks    Types: 2 Cans of beer per week    Comment: weekly    ALLERGIES:  Allergies  Allergen Reactions  . Macrolides And Ketolides Swelling    "-mycin" drugs "-mycin" drugs "-mycin" drugs   . Erythromycin     Other reaction(s): Unknown  . Propranolol   . Sulfa Antibiotics Swelling and Other (See Comments)    Other reaction(s): Other (See Comments)  . Sulfa Antibiotics Swelling  . Sulfasalazine Swelling  . Tape Other (See Comments)    Irritates the skin - raw  . Tapentadol Other (See Comments)    Heavy tape causes raw skin  . Tapentadol   . Telbivudine Swelling    "mycin" drugs   . Tape Other (See Comments) and Rash    Heavy tape causes raw skin     PERTINENT MEDICATIONS:  Outpatient Encounter Medications as of 07/02/2019  Medication Sig  . ACCU-CHEK AVIVA PLUS test strip USE 1 STRIP TO CHECK GLUCOSE 4 TIMES DAILY  . acetaminophen (TYLENOL) 325 MG tablet Take 2 tablets (650 mg total) by mouth every 6 (six) hours as needed for mild pain.  Marland Kitchen amLODipine (NORVASC) 10 MG tablet Take 10 mg by mouth daily.  Marland Kitchen aspirin 325 MG tablet Take 325 mg by mouth daily. AM  . benazepril (LOTENSIN) 40 MG tablet  Take 40 mg by mouth at bedtime.   . citalopram (CELEXA) 10 MG tablet Take 10 mg by mouth at bedtime.   . docusate sodium (COLACE) 100 MG capsule Take 1 capsule (100 mg total) by mouth 2 (two) times daily. (Patient not taking: Reported on 05/23/2019)  . finasteride (PROSCAR) 5 MG tablet TAKE 1 TABLET EVERY DAY (Patient taking differently: Take 5 mg by mouth at bedtime. )  . gabapentin (NEURONTIN) 300 MG capsule Take 300 mg by  mouth 2 (two) times daily.   . heparin 5000 UNIT/ML injection Inject 1 mL (5,000 Units total) into the skin every 8 (eight) hours. (Patient not taking: Reported on 05/23/2019)  . ipratropium-albuterol (DUONEB) 0.5-2.5 (3) MG/3ML SOLN Take 3 mLs by nebulization every 6 (six) hours as needed.  . methocarbamol (ROBAXIN) 500 MG tablet Take 1 tablet (500 mg total) by mouth every 8 (eight) hours as needed for muscle spasms. (Patient not taking: Reported on 05/23/2019)  . nitroGLYCERIN (NITROSTAT) 0.4 MG SL tablet Place under the tongue.  Marland Kitchen omeprazole (PRILOSEC) 40 MG capsule Take 40 mg by mouth daily.   Marland Kitchen oxyCODONE (OXY IR/ROXICODONE) 5 MG immediate release tablet Take 1-2 tablets (5-10 mg total) by mouth every 4 (four) hours as needed (5 mg for moderate; 10 mg for severe). (Patient not taking: Reported on 05/23/2019)  . rosuvastatin (CRESTOR) 40 MG tablet Take 40 mg by mouth at bedtime.   . tamsulosin (FLOMAX) 0.4 MG CAPS capsule TAKE 1 CAPSULE (0.4 MG TOTAL) BY MOUTH DAILY AFTER BREAKFAST. (Patient taking differently: Take 0.4 mg by mouth daily. )   No facility-administered encounter medications on file as of 07/02/2019.      Dionel Archey Jenetta Downer, NP

## 2019-07-10 ENCOUNTER — Encounter: Payer: Self-pay | Admitting: *Deleted

## 2019-07-26 ENCOUNTER — Inpatient Hospital Stay: Payer: Medicare Other

## 2019-07-29 ENCOUNTER — Encounter: Payer: Self-pay | Admitting: Internal Medicine

## 2019-07-29 ENCOUNTER — Ambulatory Visit (INDEPENDENT_AMBULATORY_CARE_PROVIDER_SITE_OTHER): Payer: Medicare Other | Admitting: Internal Medicine

## 2019-07-29 ENCOUNTER — Other Ambulatory Visit: Payer: Self-pay

## 2019-07-29 VITALS — BP 144/69 | HR 79 | Temp 97.7°F | Resp 16 | Ht 71.0 in | Wt 142.0 lb

## 2019-07-29 DIAGNOSIS — N183 Chronic kidney disease, stage 3 unspecified: Secondary | ICD-10-CM | POA: Diagnosis not present

## 2019-07-29 DIAGNOSIS — I25811 Atherosclerosis of native coronary artery of transplanted heart without angina pectoris: Secondary | ICD-10-CM

## 2019-07-29 DIAGNOSIS — J449 Chronic obstructive pulmonary disease, unspecified: Secondary | ICD-10-CM

## 2019-07-29 DIAGNOSIS — C61 Malignant neoplasm of prostate: Secondary | ICD-10-CM

## 2019-07-29 MED ORDER — IPRATROPIUM-ALBUTEROL 0.5-2.5 (3) MG/3ML IN SOLN: 3.0000 mL | Freq: Four times a day (QID) | RESPIRATORY_TRACT | Status: DC | PRN
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
## 2019-07-31 ENCOUNTER — Ambulatory Visit: Payer: 59 | Admitting: Radiation Oncology

## 2019-08-01 ENCOUNTER — Other Ambulatory Visit: Payer: Self-pay

## 2019-08-01 ENCOUNTER — Encounter: Payer: Self-pay | Admitting: Emergency Medicine

## 2019-08-01 ENCOUNTER — Emergency Department
Admission: EM | Admit: 2019-08-01 | Discharge: 2019-08-02 | Disposition: A | Discharge status: Home / Self Care | Payer: Medicare Other | Attending: Emergency Medicine | Admitting: Emergency Medicine

## 2019-08-01 ENCOUNTER — Emergency Department: Payer: Medicare Other

## 2019-08-01 ENCOUNTER — Telehealth: Payer: Self-pay | Admitting: Adult Health Nurse Practitioner

## 2019-08-01 DIAGNOSIS — L304 Erythema intertrigo: Secondary | ICD-10-CM | POA: Diagnosis not present

## 2019-08-01 DIAGNOSIS — S0121XA Laceration without foreign body of nose, initial encounter: Secondary | ICD-10-CM | POA: Diagnosis not present

## 2019-08-01 DIAGNOSIS — N183 Chronic kidney disease, stage 3 unspecified: Secondary | ICD-10-CM | POA: Diagnosis not present

## 2019-08-01 DIAGNOSIS — Z87891 Personal history of nicotine dependence: Secondary | ICD-10-CM | POA: Insufficient documentation

## 2019-08-01 DIAGNOSIS — F0390 Unspecified dementia without behavioral disturbance: Secondary | ICD-10-CM | POA: Diagnosis not present

## 2019-08-01 DIAGNOSIS — E1122 Type 2 diabetes mellitus with diabetic chronic kidney disease: Secondary | ICD-10-CM | POA: Insufficient documentation

## 2019-08-01 DIAGNOSIS — Z8616 Personal history of COVID-19: Secondary | ICD-10-CM | POA: Insufficient documentation

## 2019-08-01 DIAGNOSIS — Y999 Unspecified external cause status: Secondary | ICD-10-CM | POA: Insufficient documentation

## 2019-08-01 DIAGNOSIS — Y92007 Garden or yard of unspecified non-institutional (private) residence as the place of occurrence of the external cause: Secondary | ICD-10-CM | POA: Insufficient documentation

## 2019-08-01 DIAGNOSIS — I2510 Atherosclerotic heart disease of native coronary artery without angina pectoris: Secondary | ICD-10-CM | POA: Diagnosis not present

## 2019-08-01 DIAGNOSIS — Z7982 Long term (current) use of aspirin: Secondary | ICD-10-CM | POA: Insufficient documentation

## 2019-08-01 DIAGNOSIS — W0110XA Fall on same level from slipping, tripping and stumbling with subsequent striking against unspecified object, initial encounter: Secondary | ICD-10-CM | POA: Insufficient documentation

## 2019-08-01 DIAGNOSIS — Z79899 Other long term (current) drug therapy: Secondary | ICD-10-CM | POA: Insufficient documentation

## 2019-08-01 DIAGNOSIS — Z96659 Presence of unspecified artificial knee joint: Secondary | ICD-10-CM | POA: Diagnosis not present

## 2019-08-01 DIAGNOSIS — I129 Hypertensive chronic kidney disease with stage 1 through stage 4 chronic kidney disease, or unspecified chronic kidney disease: Secondary | ICD-10-CM | POA: Diagnosis not present

## 2019-08-01 DIAGNOSIS — Z8546 Personal history of malignant neoplasm of prostate: Secondary | ICD-10-CM | POA: Insufficient documentation

## 2019-08-01 DIAGNOSIS — S0181XA Laceration without foreign body of other part of head, initial encounter: Secondary | ICD-10-CM

## 2019-08-01 DIAGNOSIS — Z9861 Coronary angioplasty status: Secondary | ICD-10-CM | POA: Insufficient documentation

## 2019-08-01 DIAGNOSIS — S0990XA Unspecified injury of head, initial encounter: Secondary | ICD-10-CM

## 2019-08-01 DIAGNOSIS — Y939 Activity, unspecified: Secondary | ICD-10-CM | POA: Insufficient documentation

## 2019-08-01 DIAGNOSIS — S0182XA Laceration with foreign body of other part of head, initial encounter: Secondary | ICD-10-CM | POA: Insufficient documentation

## 2019-08-01 LAB — BASIC METABOLIC PANEL
Anion gap: 11 (ref 5–15)
BUN: 18 mg/dL (ref 8–23)
CO2: 21 mmol/L — ABNORMAL LOW (ref 22–32)
Calcium: 9 mg/dL (ref 8.9–10.3)
Chloride: 100 mmol/L (ref 98–111)
Creatinine, Ser: 0.88 mg/dL (ref 0.61–1.24)
GFR calc Af Amer: >60 mL/min (ref >60)
GFR calc non Af Amer: >60 mL/min (ref >60)
Glucose, Bld: 177 mg/dL — ABNORMAL HIGH (ref 70–99)
Potassium: 4 mmol/L (ref 3.5–5.1)
Sodium: 132 mmol/L — ABNORMAL LOW (ref 135–145)

## 2019-08-01 LAB — CBC
HCT: 36.7 % — ABNORMAL LOW (ref 39.0–52.0)
Hemoglobin: 12.6 g/dL — ABNORMAL LOW (ref 13.0–17.0)
MCH: 29.4 pg (ref 26.0–34.0)
MCHC: 34.3 g/dL (ref 30.0–36.0)
MCV: 85.7 fL (ref 80.0–100.0)
Platelets: 150 10*3/uL (ref 150–400)
RBC: 4.28 MIL/uL (ref 4.22–5.81)
RDW: 14.3 % (ref 11.5–15.5)
WBC: 6.7 10*3/uL (ref 4.0–10.5)
nRBC: 0 % (ref 0.0–0.2)

## 2019-08-01 LAB — ETHANOL: Alcohol, Ethyl (B): 174 mg/dL — ABNORMAL HIGH (ref <10)

## 2019-08-01 MED ORDER — LIDOCAINE HCL (PF) 1 % IJ SOLN
INTRAMUSCULAR | Status: AC
  Administered 2019-08-02: 5 mL via INTRADERMAL
  Filled 2019-08-01: qty 5

## 2019-08-01 MED ORDER — SODIUM CHLORIDE 0.9% FLUSH: 3.0000 mL | Freq: Once | INTRAVENOUS | Status: DC

## 2019-08-01 MED ORDER — CLOTRIMAZOLE 1 % EX CREA: 1.0000 "application " | TOPICAL_CREAM | Freq: Two times a day (BID) | CUTANEOUS | 0 refills | Status: DC

## 2019-08-01 NOTE — Discharge Instructions (Signed)
Apply the cream to the rash on the back of the leg as prescribed.  Return here or to your primary care in 5 to 7 days to have the stitches removed from your forehead.  Return to the ER for new, worsening, or persistent severe headache, bleeding, pus drainage, dizziness, or any other new or worsening symptoms that concern you.

## 2019-08-01 NOTE — ED Notes (Signed)
Patient visitor requesting that EDP look at patient's leg as well during visit due to recent surgery.
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
## 2019-08-01 NOTE — ED Triage Notes (Signed)
Pt from home after falling. Pt's brother is with him, states that he has dementia and that he I the POA. Pt states he does not remember falling, does not know if he had loc. Pt's brother states he fell in the yard. Pt ha a caregiver until 1PM and then his brother comes just after 2. Pt fell between 1 & 2 PM. Pt alert, oriented to self and place. Pt confused the year. NAD noted.

## 2019-08-01 NOTE — ED Notes (Signed)
Dressing changed family and patient updated to the best of this RNs ability.

## 2019-08-01 NOTE — Telephone Encounter (Signed)
Returned brother's call.  He had questions about how to limit patient's beer intake.  Discussed having all caretakers informed on expectations if not wanting him to have beer at all or limiting to one a day.  Discussed eventual placement in ALF when needed.  Scheduled appointment for 08/05/19 at 2pm Todd Davidson K. Olena Heckle NP

## 2019-08-02 ENCOUNTER — Ambulatory Visit: Payer: 59 | Admitting: Radiation Oncology

## 2019-08-02 DIAGNOSIS — S0182XA Laceration with foreign body of other part of head, initial encounter: Secondary | ICD-10-CM | POA: Diagnosis not present

## 2019-08-02 MED ORDER — LIDOCAINE HCL (PF) 1 % IJ SOLN: 5.0000 mL | Freq: Once | INTRAMUSCULAR | Status: AC

## 2019-08-05 ENCOUNTER — Other Ambulatory Visit: Payer: Self-pay

## 2019-08-05 ENCOUNTER — Telehealth: Payer: Self-pay

## 2019-08-05 ENCOUNTER — Other Ambulatory Visit: Payer: Medicare Other | Admitting: Adult Health Nurse Practitioner

## 2019-08-05 DIAGNOSIS — F0391 Unspecified dementia with behavioral disturbance: Secondary | ICD-10-CM

## 2019-08-05 DIAGNOSIS — J449 Chronic obstructive pulmonary disease, unspecified: Secondary | ICD-10-CM

## 2019-08-05 DIAGNOSIS — Z515 Encounter for palliative care: Secondary | ICD-10-CM

## 2019-08-05 MED ORDER — IPRATROPIUM-ALBUTEROL 0.5-2.5 (3) MG/3ML IN SOLN: 3.0000 mL | Freq: Four times a day (QID) | RESPIRATORY_TRACT | 1 refills | Status: DC | PRN

## 2019-08-05 NOTE — Progress Notes (Signed)
[facility] Consult Note Telephone: [phone]  Fax: [phone]  PATIENT NAME: Todd Davidson DOB: 04-May-1935 MRN: [id number]  PRIMARY CARE PROVIDER:   Perrin Maltese, MD  REFERRING PROVIDER:  Evern Bio NP  RESPONSIBLE PARTY:   Todd Davidson, brother [phone]      RECOMMENDATIONS and PLAN:  1.  Advanced care planning. Has advance directives uploaded in [place].  2.  Dementia. Patient is able to walk with a walker. Patient does require occasional assistance with ADLs and is not able to perform IADLs.   He states his appetite is okay and he eats about 2 meals a day, this is not new.  Have encouraged to have some snacks in between meals and to drink plenty of fluids to keep his blood sugars more even and to stay hydrated.  Family has set up a caregiver who comes in [place] through Fri from 7am to 1pm.  On 08/01/19 he had a fall in which he was ambulating without his walker and fell on gravel drive way causing laceration to forehead requiring sutures.  Today does have healing ecchymosis over forehead and around eyes.  Family does report that he did have some beers prior to the accident.  Discussed at length getting more help in the home so that patient has more supervision.  Discussed 24/7 care at home or at least more care during the hours he would more likely need to have someone there when he wants to be out of the house.  Brother feels like he would be okay at night by himself as he sleeps well and does not get up but to go to the bathroom.  We also discussed placement in ALF memory care unit.  Discussed having current caregiver come in for a couple more hours per day and getting something like [facility] [employer]. Family is encouraged to call if needing help with placement or finding extra help in the home and will get SW involved if needed.  Palliative will continue to monitor for symptom management/decline and make recommendations as needed. Will  call in 6-8 weeks for follow up.  Brother encouraged to call with any concerns  I spent 90 minutes providing this consultation,  from 2:00 to 3:30 including time with patient/family, chart review, provider coordination, and documentation. More than 50% of the time in this consultation was spent coordinating communication.   HISTORY OF PRESENT ILLNESS:  Todd Davidson is a 84 y.o. year old adult with multiple medical problems including dementia, CAD, prostate cancer, renal mass, asthma, HTN, Barrett's esophagus. Palliative Care was asked to help address goals of care.   CODE STATUS: see above  PPS: 60% HOSPICE ELIGIBILITY/DIAGNOSIS: TBD  PHYSICAL EXAM: BP 138/72 HR 76 O2 97% on RA General: NAD, frail appearing Cardiovascular: regular rate and rhythm Pulmonary:lung sounds clear; normal respiratory effort Extremities: trace edema to bilateral feet,brace to right knee Skin: no rashes on exposed skin Neurological: Weakness; Has forgetfulness.  PAST MEDICAL HISTORY:  Past Medical History:  Diagnosis Date  . Arthritis    neck, shoulders, hips, knees  . Asthma    as child, out grew  . Barrett esophagus   . BPH (benign prostatic hyperplasia)   . Coronary artery disease    annual stress with Dr. Humphrey Rolls. no new findings  . Coronary artery disease   . Dementia ([place])   . Depression   . Diabetes mellitus without complication (HCC)    no medications currently was on insulin but  was dropping blood sugars too low causing syncope  . Diverticulosis   . Duodenitis   . Gastritis   . GERD (gastroesophageal reflux disease)   . History of COVID-19 02/2019  . History of kidney stones   . Hypercholesteremia   . Hypertension   . Left renal mass   . Leg fracture, right    wears brace  . Neuromuscular disorder (HCC)    neuropathy - feet  . Occasional tremors    hands  . Prostate cancer ([place])   . Renal insufficiency   . Sleep apnea    doesn't wear CPAP, pt denies  . Wears dentures     full upper    SOCIAL HX:  Social History   Tobacco Use  . Smoking status: Former Smoker    Packs/day: 1.00    Years: 20.00    Pack years: 20.00    Types: Cigarettes, Pipe, Cigars    Quit date: 03/21/1973    Years since quitting: 46.4  . Smokeless tobacco: Current User  Substance Use Topics  . Alcohol use: Yes    Alcohol/week: 2.0 standard drinks    Types: 2 Cans of beer per week    Comment: weekly    ALLERGIES:  Allergies  Allergen Reactions  . Macrolides And Ketolides Swelling    "-mycin" drugs "-mycin" drugs "-mycin" drugs   . Erythromycin     Other reaction(s): Unknown  . Propranolol   . Sulfa Antibiotics Swelling and Other (See Comments)    Other reaction(s): Other (See Comments)  . Sulfa Antibiotics Swelling  . Sulfasalazine Swelling  . Tape Other (See Comments)    Irritates the skin - raw  . Tapentadol Other (See Comments)    Heavy tape causes raw skin  . Tapentadol   . Telbivudine Swelling    "mycin" drugs   . Tape Other (See Comments) and Rash    Heavy tape causes raw skin     PERTINENT MEDICATIONS:  Outpatient Encounter Medications as of 08/05/2019  Medication Sig  . ACCU-CHEK AVIVA PLUS test strip USE 1 STRIP TO CHECK GLUCOSE 4 TIMES DAILY  . acetaminophen (TYLENOL) 325 MG tablet Take 2 tablets (650 mg total) by mouth every 6 (six) hours as needed for mild pain.  Marland Kitchen amLODipine (NORVASC) 10 MG tablet Take 10 mg by mouth daily.  Marland Kitchen aspirin 325 MG tablet Take 325 mg by mouth daily. AM  . benazepril (LOTENSIN) 40 MG tablet Take 40 mg by mouth at bedtime.   . citalopram (CELEXA) 10 MG tablet Take 10 mg by mouth at bedtime.   . clotrimazole (LOTRIMIN) 1 % cream Apply 1 application topically 2 (two) times daily.  . finasteride (PROSCAR) 5 MG tablet TAKE 1 TABLET EVERY DAY (Patient taking differently: Take 5 mg by mouth at bedtime. )  . gabapentin (NEURONTIN) 300 MG capsule Take 300 mg by mouth 2 (two) times daily.   . heparin 5000 UNIT/ML injection Inject 1 mL  (5,000 Units total) into the skin every 8 (eight) hours.  Marland Kitchen ipratropium-albuterol (DUONEB) 0.5-2.5 (3) MG/3ML SOLN Take 3 mLs by nebulization every 6 (six) hours as needed.  . methocarbamol (ROBAXIN) 500 MG tablet Take 1 tablet (500 mg total) by mouth every 8 (eight) hours as needed for muscle spasms. (Patient not taking: Reported on 07/29/2019)  . nitroGLYCERIN (NITROSTAT) 0.4 MG SL tablet Place under the tongue.  Marland Kitchen omeprazole (PRILOSEC) 40 MG capsule Take 40 mg by mouth daily.   Marland Kitchen oxyCODONE (OXY IR/ROXICODONE) 5 MG immediate  release tablet Take 1-2 tablets (5-10 mg total) by mouth every 4 (four) hours as needed (5 mg for moderate; 10 mg for severe).  . rosuvastatin (CRESTOR) 40 MG tablet Take 40 mg by mouth at bedtime.   . tamsulosin (FLOMAX) 0.4 MG CAPS capsule TAKE 1 CAPSULE (0.4 MG TOTAL) BY MOUTH DAILY AFTER BREAKFAST. (Patient taking differently: Take 0.4 mg by mouth daily. )   No facility-administered encounter medications on file as of 08/05/2019.     Rockell Faulks Jenetta Downer, NP

## 2019-08-05 NOTE — Telephone Encounter (Signed)
Called lmom informing patient of appointment on 08/07/2019. klh

## 2019-08-06 ENCOUNTER — Other Ambulatory Visit: Payer: Self-pay | Admitting: Student

## 2019-08-06 ENCOUNTER — Other Ambulatory Visit (HOSPITAL_COMMUNITY): Payer: Self-pay | Admitting: Student

## 2019-08-06 DIAGNOSIS — M960 Pseudarthrosis after fusion or arthrodesis: Secondary | ICD-10-CM

## 2019-08-07 ENCOUNTER — Other Ambulatory Visit: Payer: Self-pay

## 2019-08-07 ENCOUNTER — Ambulatory Visit (INDEPENDENT_AMBULATORY_CARE_PROVIDER_SITE_OTHER): Payer: Medicare Other | Admitting: Internal Medicine

## 2019-08-07 DIAGNOSIS — J449 Chronic obstructive pulmonary disease, unspecified: Secondary | ICD-10-CM

## 2019-08-07 LAB — PULMONARY FUNCTION TEST

## 2019-08-08 NOTE — Procedures (Signed)
[place], [id number]  DATE OF SERVICE: Aug 07, 2019  Complete Pulmonary Function Testing Interpretation:  FINDINGS:  Vital capacity is normal FEV1 is normal FEV1 FVC ratio is moderately decreased.  Bronchodilator no significant change in FEV1 clinical improvement may still occur in the absence of spirometric improvement.  Total capacity is increased residual volume is increased FRC is increased.  DLCO is mildly decreased.  IMPRESSION:  This pulmonary function study is suggestive of mild obstructive lung disease to borderline normal.  There does not appear to be significant response to bronchodilators  Allyne Gee, MD [facility] Pulmonary Critical Care Medicine Sleep Medicine

## 2019-08-09 ENCOUNTER — Other Ambulatory Visit: Payer: Self-pay

## 2019-08-09 ENCOUNTER — Ambulatory Visit
Admission: RE | Admit: 2019-08-09 | Discharge: 2019-08-09 | Disposition: A | Discharge status: Home / Self Care | Payer: Medicare Other | Source: Ambulatory Visit | Attending: Student | Admitting: Student

## 2019-08-09 ENCOUNTER — Emergency Department
Admission: EM | Admit: 2019-08-09 | Discharge: 2019-08-09 | Disposition: A | Discharge status: Home / Self Care | Payer: Medicare Other | Attending: Emergency Medicine | Admitting: Emergency Medicine

## 2019-08-09 DIAGNOSIS — E119 Type 2 diabetes mellitus without complications: Secondary | ICD-10-CM | POA: Insufficient documentation

## 2019-08-09 DIAGNOSIS — Z7984 Long term (current) use of oral hypoglycemic drugs: Secondary | ICD-10-CM | POA: Diagnosis not present

## 2019-08-09 DIAGNOSIS — J45909 Unspecified asthma, uncomplicated: Secondary | ICD-10-CM | POA: Diagnosis not present

## 2019-08-09 DIAGNOSIS — Z87891 Personal history of nicotine dependence: Secondary | ICD-10-CM | POA: Insufficient documentation

## 2019-08-09 DIAGNOSIS — X58XXXD Exposure to other specified factors, subsequent encounter: Secondary | ICD-10-CM | POA: Diagnosis not present

## 2019-08-09 DIAGNOSIS — S0181XD Laceration without foreign body of other part of head, subsequent encounter: Secondary | ICD-10-CM | POA: Diagnosis not present

## 2019-08-09 DIAGNOSIS — M960 Pseudarthrosis after fusion or arthrodesis: Secondary | ICD-10-CM | POA: Insufficient documentation

## 2019-08-09 DIAGNOSIS — Z79899 Other long term (current) drug therapy: Secondary | ICD-10-CM | POA: Insufficient documentation

## 2019-08-09 DIAGNOSIS — Z4802 Encounter for removal of sutures: Secondary | ICD-10-CM

## 2019-08-09 DIAGNOSIS — I10 Essential (primary) hypertension: Secondary | ICD-10-CM | POA: Diagnosis not present

## 2019-08-09 NOTE — Discharge Instructions (Addendum)
Follow discharge care instructions. 
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
## 2019-08-09 NOTE — ED Triage Notes (Signed)
Suture removal to forehead, has been in since 5/13

## 2019-08-09 NOTE — ED Notes (Addendum)
See triage note. Pt ambulatory to room with walker as assistance. Pt here for suture removal. Pt in NAD. Pt denies any complaints.

## 2019-09-06 ENCOUNTER — Other Ambulatory Visit: Payer: Self-pay

## 2019-09-06 ENCOUNTER — Inpatient Hospital Stay: Payer: Medicare Other | Attending: Radiation Oncology

## 2019-09-06 DIAGNOSIS — Z923 Personal history of irradiation: Secondary | ICD-10-CM | POA: Insufficient documentation

## 2019-09-06 DIAGNOSIS — C61 Malignant neoplasm of prostate: Secondary | ICD-10-CM | POA: Diagnosis present

## 2019-09-06 LAB — PSA: Prostatic Specific Antigen: 1.15 ng/mL (ref 0.00–4.00)

## 2019-09-13 ENCOUNTER — Other Ambulatory Visit: Payer: Self-pay

## 2019-09-13 ENCOUNTER — Ambulatory Visit
Admission: RE | Admit: 2019-09-13 | Discharge: 2019-09-13 | Disposition: A | Discharge status: Home / Self Care | Payer: Medicare Other | Source: Ambulatory Visit | Attending: Radiation Oncology | Admitting: Radiation Oncology

## 2019-09-13 ENCOUNTER — Encounter: Payer: Self-pay | Admitting: Radiation Oncology

## 2019-09-13 ENCOUNTER — Other Ambulatory Visit: Payer: Self-pay | Admitting: *Deleted

## 2019-09-13 VITALS — BP 130/78 | HR 68 | Temp 97.0°F | Resp 16 | Wt 145.6 lb

## 2019-09-13 DIAGNOSIS — Z923 Personal history of irradiation: Secondary | ICD-10-CM | POA: Insufficient documentation

## 2019-09-13 DIAGNOSIS — C61 Malignant neoplasm of prostate: Secondary | ICD-10-CM | POA: Diagnosis not present

## 2019-09-13 DIAGNOSIS — Z85528 Personal history of other malignant neoplasm of kidney: Secondary | ICD-10-CM | POA: Insufficient documentation

## 2019-09-13 NOTE — Progress Notes (Signed)
Radiation Oncology Follow up Note  Name: Todd Davidson   Date:   09/13/2019 MRN:  [id number] DOB: 1935/12/30    This 85 y.o. adult presents to the clinic today for 1 year follow-up status post IMRT radiation therapy for stage IIb Gleason 7 (3+4) adenocarcinoma the prostate presenting with a PSA of 13.6  REFERRING PROVIDER: Perrin Maltese, MD  HPI: Patient is a 84 year old male now seen at 1 year having completed IMRT radiation therapy for Gleason 7 adenocarcinoma the prostate seen today in routine follow-up he is quite frail.  He specifically denies any increased lower urinary tract symptoms diarrhea.  His most recent PSA is 1.15.  He specifically denies bone pain..  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  Well-developed well-nourished patient in NAD. HEENT reveals PERLA, EOMI, discs not visualized.  Oral cavity is clear. No oral mucosal lesions are identified. Neck is clear without evidence of cervical or supraclavicular adenopathy. Lungs are clear to A&P. Cardiac examination is essentially unremarkable with regular rate and rhythm without murmur rub or thrill. Abdomen is benign with no organomegaly or masses noted. Motor sensory and DTR levels are equal and symmetric in the upper and lower extremities. Cranial nerves II through XII are grossly intact. Proprioception is intact. No peripheral adenopathy or edema is identified. No motor or sensory levels are noted. Crude visual fields are within normal range.  RADIOLOGY RESULTS: No current films to review  PLAN: Present time patient is doing well from a PSA biochemical  standpoint of his prostate cancer now at 1 year.  And pleased with his overall progress.  He has multiple other other medical comorbidities.  I have asked to see him back in 1 year for follow-up with a PSA at that time.  Patient knows to call with any concerns.  I would like to take this opportunity to thank you for allowing me to participate in the care of your patient.Noreene Filbert, MD

## 2019-09-19 ENCOUNTER — Other Ambulatory Visit: Payer: Self-pay | Admitting: Interventional Radiology

## 2019-09-19 DIAGNOSIS — N2889 Other specified disorders of kidney and ureter: Secondary | ICD-10-CM

## 2019-10-03 ENCOUNTER — Other Ambulatory Visit: Payer: Self-pay

## 2019-10-03 DIAGNOSIS — J449 Chronic obstructive pulmonary disease, unspecified: Secondary | ICD-10-CM

## 2019-10-03 MED ORDER — IPRATROPIUM-ALBUTEROL 0.5-2.5 (3) MG/3ML IN SOLN: 3.0000 mL | Freq: Four times a day (QID) | RESPIRATORY_TRACT | 1 refills | Status: DC | PRN

## 2019-10-08 ENCOUNTER — Ambulatory Visit
Admission: RE | Admit: 2019-10-08 | Discharge: 2019-10-08 | Disposition: A | Discharge status: Home / Self Care | Payer: Medicare Other | Source: Ambulatory Visit | Attending: Interventional Radiology | Admitting: Interventional Radiology

## 2019-10-08 ENCOUNTER — Other Ambulatory Visit: Payer: Self-pay

## 2019-10-08 DIAGNOSIS — N2889 Other specified disorders of kidney and ureter: Secondary | ICD-10-CM | POA: Diagnosis present

## 2019-10-08 LAB — POCT I-STAT CREATININE: Creatinine, Ser: 1.2 mg/dL (ref 0.61–1.24)

## 2019-10-08 MED ORDER — IOHEXOL 300 MG/ML  SOLN
100.0000 mL | Freq: Once | INTRAMUSCULAR | Status: AC | PRN
  Administered 2019-10-08: 100 mL via INTRAVENOUS

## 2019-10-09 ENCOUNTER — Other Ambulatory Visit: Payer: Self-pay

## 2019-10-09 DIAGNOSIS — J449 Chronic obstructive pulmonary disease, unspecified: Secondary | ICD-10-CM

## 2019-10-09 MED ORDER — IPRATROPIUM-ALBUTEROL 0.5-2.5 (3) MG/3ML IN SOLN: 3.0000 mL | Freq: Four times a day (QID) | RESPIRATORY_TRACT | 1 refills | Status: DC | PRN

## 2019-10-10 ENCOUNTER — Ambulatory Visit
Admission: RE | Admit: 2019-10-10 | Discharge: 2019-10-10 | Disposition: A | Discharge status: Home / Self Care | Payer: Medicare Other | Source: Ambulatory Visit | Attending: Interventional Radiology | Admitting: Interventional Radiology

## 2019-10-10 ENCOUNTER — Encounter: Payer: Self-pay | Admitting: *Deleted

## 2019-10-10 ENCOUNTER — Other Ambulatory Visit: Payer: Self-pay

## 2019-10-10 DIAGNOSIS — N2889 Other specified disorders of kidney and ureter: Secondary | ICD-10-CM

## 2019-10-10 HISTORY — PX: IR RADIOLOGIST EVAL & MGMT: IMG5224

## 2019-10-10 NOTE — Progress Notes (Signed)
Patient ID: Todd Davidson, adult   DOB: September 23, 1935, 84 y.o.   MRN: [id number]       Chief Complaint:  Renal cell carcinoma status post cryoablation  Referring Physician(s): Dr. Erlene Quan  History of Present Illness: Todd Davidson is a 84 y.o. adult with a previous history of prostate cancer status post prior radiation.  He is now 69-month status post biopsy and cryoablation of the left upper pole renal mass.  Biopsy returned clear cell carcinoma.  Treatment was performed at [facility] 06/05/2019.  He is recovered very well.  He is back to his baseline.  Stable functional status.  No urinary tract symptoms, hematuria or dysuria.  No recent illness or fevers.  Interval surveillance imaging performed at [facility] 10/08/2019.  Stable ablation defect.  No signs of residual or recurrent disease.  No abnormal residual enhancement.  Stable renal cysts.  Past Medical History:  Diagnosis Date  . Arthritis    neck, shoulders, hips, knees  . Asthma    as child, out grew  . Barrett esophagus   . BPH (benign prostatic hyperplasia)   . Coronary artery disease    annual stress with Dr. Humphrey Rolls. no new findings  . Coronary artery disease   . Dementia ([place])   . Depression   . Diabetes mellitus without complication (HCC)    no medications currently was on insulin but was dropping blood sugars too low causing syncope  . Diverticulosis   . Duodenitis   . Gastritis   . GERD (gastroesophageal reflux disease)   . History of COVID-19 02/2019  . History of kidney stones   . Hypercholesteremia   . Hypertension   . Left renal mass   . Leg fracture, right    wears brace  . Neuromuscular disorder (HCC)    neuropathy - feet  . Occasional tremors    hands  . Prostate cancer ([place])   . Renal insufficiency   . Sleep apnea    doesn't wear CPAP, pt denies  . Wears dentures    full upper    Past Surgical History:  Procedure Laterality Date  . APPENDECTOMY    . CARDIAC CATHETERIZATION  1999,  2000   stents placed  . CATARACT EXTRACTION W/PHACO Left 10/29/2014   Procedure: CATARACT EXTRACTION PHACO AND INTRAOCULAR LENS PLACEMENT (IOC);  Surgeon: Leandrew Koyanagi, MD;  Location: [place];  Service: Ophthalmology;  Laterality: Left;  DIABETIC - insulin MALYUGIN  . CERVICAL FUSION    . CORONARY ANGIOPLASTY    . ESOPHAGOGASTRODUODENOSCOPY N/A 12/29/2017   Procedure: ESOPHAGOGASTRODUODENOSCOPY (EGD);  Surgeon: [name], MD;  Location: [facility] ENDOSCOPY;  Service: Endoscopy;  Laterality: N/A;  . ESOPHAGOGASTRODUODENOSCOPY (EGD) WITH PROPOFOL N/A 12/17/2015   Procedure: ESOPHAGOGASTRODUODENOSCOPY (EGD) WITH PROPOFOL;  Surgeon: [name], MD;  Location: [facility] ENDOSCOPY;  Service: Endoscopy;  Laterality: N/A;  . EYE SURGERY Right   . HERNIA REPAIR     x2  . I & D EXTREMITY Right 10/31/2018   Procedure: IRRIGATION AND DEBRIDEMENT RIGHT KNEE  EXTERNAL FIXATION OF FRACTURE;  Surgeon: Shona Needles, MD;  Location: [place];  Service: Orthopedics;  Laterality: Right;  IRRIGATION AND DEBRIDEMENT RIGHT KNEE  EXTERNAL FIXATION OF FRACTURE  . IR RADIOLOGIST EVAL & MGMT  02/28/2019  . IR RADIOLOGIST EVAL & MGMT  04/30/2019  . IR RADIOLOGIST EVAL & MGMT  07/02/2019  . IRRIGATION AND DEBRIDEMENT KNEE Right 10/31/2018  . JOINT REPLACEMENT     knee  . KNEE FUSION  [place] Right    joint removal with fusion and graft  . NISSEN FUNDOPLICATION    . RADIOFREQUENCY ABLATION Left 06/05/2019   Procedure: LEFT RENAL  CRYO ABLATION;  Surgeon: Greggory Keen, MD;  Location: [facility];  Service: Anesthesiology;  Laterality: Left;    Allergies: Macrolides and ketolides, Erythromycin, Propranolol, Sulfa antibiotics, Sulfa antibiotics, Sulfasalazine, Tape, Tapentadol, Tapentadol, Telbivudine, and Tape  Medications: Prior to Admission medications   Medication Sig Start Date End Date Taking? Authorizing Provider  ACCU-CHEK AVIVA PLUS test strip USE 1 STRIP TO CHECK GLUCOSE  4 TIMES DAILY 08/13/17   [provider]  acetaminophen (TYLENOL) 325 MG tablet Take 2 tablets (650 mg total) by mouth every 6 (six) hours as needed for mild pain. 11/05/18   Norm Parcel, PA-C  amLODipine (NORVASC) 10 MG tablet Take 10 mg by mouth daily. 09/04/18   [provider]  aspirin 325 MG tablet Take 325 mg by mouth daily. AM    [provider]  benazepril (LOTENSIN) 40 MG tablet Take 40 mg by mouth at bedtime.  09/04/18   [provider]  citalopram (CELEXA) 10 MG tablet Take 10 mg by mouth at bedtime.  10/30/18   [provider]  clotrimazole (LOTRIMIN) 1 % cream Apply 1 application topically 2 (two) times daily. 08/01/19   Arta Silence, MD  finasteride (PROSCAR) 5 MG tablet TAKE 1 TABLET EVERY DAY Patient taking differently: Take 5 mg by mouth at bedtime.  04/05/19   Stoioff, Ronda Fairly, MD  gabapentin (NEURONTIN) 300 MG capsule Take 300 mg by mouth 2 (two) times daily.  09/27/18   [provider]  heparin 5000 UNIT/ML injection Inject 1 mL (5,000 Units total) into the skin every 8 (eight) hours. 11/05/18   Norm Parcel, PA-C  ipratropium-albuterol (DUONEB) 0.5-2.5 (3) MG/3ML SOLN Take 3 mLs by nebulization every 6 (six) hours as needed. 10/09/19 01/07/20  Allyne Gee, MD  methocarbamol (ROBAXIN) 500 MG tablet Take 1 tablet (500 mg total) by mouth every 8 (eight) hours as needed for muscle spasms. 11/05/18   Norm Parcel, PA-C  nitroGLYCERIN (NITROSTAT) 0.4 MG SL tablet Place under the tongue. 06/05/12   [provider]  omeprazole (PRILOSEC) 40 MG capsule Take 40 mg by mouth daily.     [provider]  rosuvastatin (CRESTOR) 40 MG tablet Take 40 mg by mouth at bedtime.  09/18/18   [provider]  tamsulosin (FLOMAX) 0.4 MG CAPS capsule TAKE 1 CAPSULE (0.4 MG TOTAL) BY MOUTH DAILY AFTER BREAKFAST. Patient taking differently: Take 0.4 mg by mouth daily.  04/05/19   Abbie Sons, MD     Family  History  Problem Relation Age of Onset  . Prostate cancer Father   . Heart disease Brother   . Stroke Mother   . Breast cancer Sister     Social History   Socioeconomic History  . Marital status: Widowed    Spouse name: Not on file  . Number of children: Not on file  . Years of education: Not on file  . Highest education level: Not on file  Occupational History  . Not on file  Tobacco Use  . Smoking status: Former Smoker    Packs/day: 1.00    Years: 20.00    Pack years: 20.00    Types: Cigarettes, Pipe, Cigars    Quit date: 03/21/1973    Years since quitting: 46.5  . Smokeless tobacco: Current User  Vaping Use  . Vaping Use: Never used  Substance and Sexual Activity  . Alcohol use: Yes    Alcohol/week: 2.0 standard drinks    Types: 2 Cans of beer per week    Comment: weekly  . Drug use: Never  . Sexual activity: Not Currently  Other Topics Concern  . Not on file  Social History Narrative   ** Merged History Encounter **       Social Determinants of Health   Financial Resource Strain:   . Difficulty of Paying Living Expenses:   Food Insecurity:   . Worried About Charity fundraiser in the Last Year:   . Arboriculturist in the Last Year:   Transportation Needs:   . Film/video editor (Medical):   Marland Kitchen Lack of Transportation (Non-Medical):   Physical Activity:   . Days of Exercise per Week:   . Minutes of Exercise per Session:   Stress:   . Feeling of Stress :   Social Connections:   . Frequency of Communication with Friends and Family:   . Frequency of Social Gatherings with Friends and Family:   . Attends Religious Services:   . Active Member of Clubs or Organizations:   . Attends Archivist Meetings:   Marland Kitchen Marital Status:     Review of Systems  Review of Systems: A 12 point ROS discussed and pertinent positives are indicated in the HPI above.  All other systems are negative.  Physical Exam No direct physical exam was performed, telephone  health visit only because of Covid pandemic Vital Signs: There were no vitals taken for this visit.  Imaging: CT ABDOMEN W WO CONTRAST  Result Date: 10/08/2019 CLINICAL DATA:  LEFT renal mass. Post cryoablation, follow-up assessment EXAM: CT ABDOMEN WITHOUT AND WITH CONTRAST TECHNIQUE: Multidetector CT imaging of the abdomen was performed following the standard protocol before and following the bolus administration of intravenous contrast. CONTRAST:  163mL OMNIPAQUE IOHEXOL 300 MG/ML  SOLN COMPARISON:  04/09/2019 FINDINGS: Lower chest: Signs of pulmonary emphysema. Lung bases otherwise unremarkable. No effusion. Signs of coronary artery disease. Hepatobiliary: No focal, suspicious hepatic lesion. 6 mm hypervascular focus along the RIGHT hemi liver is transient and unchanged from previous imaging. Compatible with small flash fill hemangioma or vascular shunt lesion. Small cyst in the LEFT hemi liver. Portal vein is patent. Pancreas: No ductal dilation or inflammation. Spleen: Spleen normal size and contour. Adrenals/Urinary Tract: Adrenal glands are normal. Previously seen mass along the medial upper pole of the LEFT kidney measures 3.0 x 2.6 as compared to 3.5 x 3.0 cm following cryo ablation without signs of enhancement to suggest disease recurrence. Small hemorrhagic cyst extends inferiorly from the lower pole of the LEFT kidney. Cyst along the anterior hilar lip of the RIGHT kidney is unchanged as is an upper pole cyst. Renal cortical scarring bilaterally. No hydronephrosis. Nephrolithiasis bilateral kidneys unchanged. Stomach/Bowel: Stomach under distended limiting assessment. No acute bowel process to the extent evaluated. Bowel is incompletely imaged on today's study. Vascular/Lymphatic: Calcified and noncalcified atheromatous plaque of the abdominal aorta. No aneurysmal dilation. No abdominal adenopathy. Other: No ascites. Musculoskeletal: Spinal degenerative changes. No acute or destructive bone  finding. Similar appearance of compression fractures at L1 and L4. IMPRESSION: 1. Post cryoablation mass arising from upper pole of the LEFT kidney without signs of disease recurrence at this time. 2. Bilateral renal cysts some with hemorrhagic or proteinaceous features and bilateral nephrolithiasis as before. 3. Emphysema and aortic atherosclerosis. Aortic  Atherosclerosis (ICD10-I70.0) and Emphysema (ICD10-J43.9). Electronically Signed   By: Zetta Bills M.D.   On: 10/08/2019 13:17    Labs:  CBC: Recent Labs    11/04/18 0433 05/28/19 1050 06/05/19 0700 08/01/19 1506  WBC 5.7 8.1 6.1 6.7  HGB 8.0* 12.9* 13.3 12.6*  HCT 23.5* 39.6 39.6 36.7*  PLT 66* 174 168 150    COAGS: Recent Labs    10/31/18 0939 06/05/19 0700  INR 1.0 0.9    BMP: Recent Labs    11/03/18 0458 04/09/19 0930 05/28/19 1050 06/05/19 0700 08/01/19 1506 10/08/19 0823  NA 138  --  138 136 132*  --   K 4.1  --  4.6 4.7 4.0  --   CL 108  --  106 101 100  --   CO2 23  --  23 24 21*  --   GLUCOSE 138*  --  212* 161* 177*  --   BUN 15  --  26* 21 18  --   CALCIUM 8.3*  --  9.6 9.9 9.0  --   CREATININE 0.85   < > 0.86 0.97 0.88 1.20  GFRNONAA >60  --  >60 >60 >60  --   GFRAA >60  --  >60 >60 >60  --    < > = values in this interval not displayed.    LIVER FUNCTION TESTS: Recent Labs    10/31/18 0939 11/01/18 0417  BILITOT 0.5 0.5  AST 88* 45*  ALT 69* 42  ALKPHOS 67 49  PROT 5.8* 5.0*  ALBUMIN 3.5 3.2*    TUMOR MARKERS: No results for input(s): AFPTM, CEA, CA199, CHROMGRNA in the last 8760 hours.  Assessment and Plan:  42-month status post image guided cryoablation and biopsy of the left upper pole renal mass.  Biopsy returned clear cell carcinoma.  Surveillance imaging demonstrates a stable ablation defect.  No signs of residual or recurrent disease.  No current urinary tract symptoms.  Plan: Continue surveillance imaging in March 2022 which will be 1 year post treatment.  This can be  performed at [facility].  Electronically Signed: Greggory Keen 10/10/2019, 10:24 AM   I spent a total of    25 Minutes in remote  clinical consultation, greater than 50% of which was counseling/coordinating care for this patient with renal cell carcinoma.    Visit type: Audio only (telephone). Audio (no video) only due to patient's lack of internet/smartphone capability. Alternative for in-person consultation at [facility], [address] [place], [place], [place]. This visit type was conducted due to national recommendations for restrictions regarding the COVID-19 Pandemic (e.g. social distancing).  This format is felt to be most appropriate for this patient at this time.  All issues noted in this document were discussed and addressed.

## 2019-11-08 ENCOUNTER — Telehealth: Payer: Self-pay | Admitting: Adult Health Nurse Practitioner

## 2019-11-08 NOTE — Telephone Encounter (Signed)
Called to schedule appointment.  Left VM with reason for call and call back info [place] Sanluis K. Monseratt Ledin NP 

## 2019-12-04 ENCOUNTER — Other Ambulatory Visit: Payer: Self-pay

## 2019-12-04 ENCOUNTER — Encounter: Payer: Self-pay | Admitting: Internal Medicine

## 2019-12-04 ENCOUNTER — Ambulatory Visit (INDEPENDENT_AMBULATORY_CARE_PROVIDER_SITE_OTHER): Payer: Medicare Other | Admitting: Internal Medicine

## 2019-12-04 VITALS — BP 140/68 | HR 102 | Temp 97.8°F | Resp 16 | Ht 72.0 in

## 2019-12-04 DIAGNOSIS — J440 Chronic obstructive pulmonary disease with acute lower respiratory infection: Secondary | ICD-10-CM

## 2019-12-04 DIAGNOSIS — J209 Acute bronchitis, unspecified: Secondary | ICD-10-CM | POA: Diagnosis not present

## 2019-12-04 DIAGNOSIS — R0602 Shortness of breath: Secondary | ICD-10-CM

## 2019-12-04 MED ORDER — LEVOFLOXACIN 500 MG PO TABS: 500.0000 mg | ORAL_TABLET | Freq: Every day | ORAL | 0 refills | Status: DC

## 2019-12-04 MED ORDER — SOOTHENEB NBL 100 ADULT MASK MISC: 2 refills | Status: DC

## 2019-12-04 MED ORDER — PREDNISONE 10 MG PO TABS: ORAL_TABLET | ORAL | 0 refills | Status: DC

## 2019-12-04 NOTE — Progress Notes (Signed)
[facility] [place], [place] [address]  Internal MEDICINE  Office Visit Note  Patient Name: Todd Davidson  [id number]  [id number]  Date of Service: 12/14/2019  Chief Complaint  Patient presents with  . Acute Visit    nebulizer isnt helping as much as it used to, may not be using it correctly due to dementia    HPI  Pt is here for acute visit( pulmonary Worsening sob with cough and congestion. He is having hard time following directions to use neb due to his dementia. Caregiver is concerned about it and will like to have a some other device for better delivery of medicine. No fever or chills    Current Medication: Outpatient Encounter Medications as of 12/04/2019  Medication Sig Note  . ACCU-CHEK AVIVA PLUS test strip USE 1 STRIP TO CHECK GLUCOSE 4 TIMES DAILY   . acetaminophen (TYLENOL) 325 MG tablet Take 2 tablets (650 mg total) by mouth every 6 (six) hours as needed for mild pain.   Marland Kitchen amLODipine (NORVASC) 10 MG tablet Take 10 mg by mouth daily.   Marland Kitchen aspirin 325 MG tablet Take 325 mg by mouth daily. AM   . benazepril (LOTENSIN) 40 MG tablet Take 40 mg by mouth at bedtime.    . citalopram (CELEXA) 10 MG tablet Take 10 mg by mouth at bedtime.    . clotrimazole (LOTRIMIN) 1 % cream Apply 1 application topically 2 (two) times daily.   . finasteride (PROSCAR) 5 MG tablet TAKE 1 TABLET EVERY DAY (Patient taking differently: Take 5 mg by mouth at bedtime. )   . gabapentin (NEURONTIN) 300 MG capsule Take 300 mg by mouth 2 (two) times daily.    . heparin 5000 UNIT/ML injection Inject 1 mL (5,000 Units total) into the skin every 8 (eight) hours.   Marland Kitchen ipratropium-albuterol (DUONEB) 0.5-2.5 (3) MG/3ML SOLN Take 3 mLs by nebulization every 6 (six) hours as needed.   . methocarbamol (ROBAXIN) 500 MG tablet Take 1 tablet (500 mg total) by mouth every 8 (eight) hours as needed for muscle spasms.   . nitroGLYCERIN (NITROSTAT) 0.4 MG SL tablet Place under the tongue.  06/05/2019: Doesn't have medication at home.  Needs refill  . omeprazole (PRILOSEC) 40 MG capsule Take 40 mg by mouth daily.    . rosuvastatin (CRESTOR) 40 MG tablet Take 40 mg by mouth at bedtime.    . tamsulosin (FLOMAX) 0.4 MG CAPS capsule TAKE 1 CAPSULE (0.4 MG TOTAL) BY MOUTH DAILY AFTER BREAKFAST. (Patient taking differently: Take 0.4 mg by mouth daily. )   . levofloxacin (LEVAQUIN) 500 MG tablet Take 1 tablet (500 mg total) by mouth daily.   . predniSONE (DELTASONE) 10 MG tablet Take one tab 3 x day for 3 days, then take one tab 2 x a day for 3 days and then take one tab a day for 3 days for copd   . Respiratory Therapy Supplies (SOOTHENEB NBL 100 ADULT MASK) MISC Use with breathing treatment ( pt with dementia has problem inhaling with mouth piece )    No facility-administered encounter medications on file as of 12/04/2019.    Surgical History: Past Surgical History:  Procedure Laterality Date  . APPENDECTOMY    . CARDIAC CATHETERIZATION  1999, 2000   stents placed  . CATARACT EXTRACTION W/PHACO Left 10/29/2014   Procedure: CATARACT EXTRACTION PHACO AND INTRAOCULAR LENS PLACEMENT (IOC);  Surgeon: Leandrew Koyanagi, MD;  Location: [place];  Service: Ophthalmology;  Laterality: Left;  DIABETIC - insulin MALYUGIN  . CERVICAL FUSION    . CORONARY ANGIOPLASTY    . ESOPHAGOGASTRODUODENOSCOPY N/A 12/29/2017   Procedure: ESOPHAGOGASTRODUODENOSCOPY (EGD);  Surgeon: [name], MD;  Location[facility] END[facility]COPY;  Service: Endoscopy;  Laterality: N/A;  . ESOPHAGOGASTRODUODENOSCOPY (EGD) WITH PROPOFOL N/A 12/17/2015   Procedure: ESOPHAGOGASTRODUODENOSCOPY (EGD) WITH PROPOFOL;  Surgeon: [name], MD;  Location: [facility] ENDOSCOPY;  Service: Endoscopy;  Laterality: N/A;  . EYE SURGERY Right   . HERNIA REPAIR     x2  . I & D EXTREMITY Right 10/31/2018   Procedure: IRRIGATION AND DEBRIDEMENT RIGHT KNEE  EXTERNAL FIXATION OF FRACTURE;  Surgeon: Shona Needles, MD;  Location:  [place];  Service: Orthopedics;  Laterality: Right;  IRRIGATION AND DEBRIDEMENT RIGHT KNEE  EXTERNAL FIXATION OF FRACTURE  . IR RADIOLOGIST EVAL & MGMT  02/28/2019  . IR RADIOLOGIST EVAL & MGMT  04/30/2019  . IR RADIOLOGIST EVAL & MGMT  07/02/2019  . IR RADIOLOGIST EVAL & MGMT  10/10/2019  . IRRIGATION AND DEBRIDEMENT KNEE Right 10/31/2018  . JOINT REPLACEMENT     knee  . [place]   . KNEE SURGERY Right    joint removal with fusion and graft  . NISSEN FUNDOPLICATION    . RADIOFREQUENCY ABLATION Left 06/05/2019   Procedure: LEFT RENAL  CRYO ABLATION;  Surgeon: Greggory Keen, MD;  Location: [facility];  Service: Anesthesiology;  Laterality: Left;    Medical History: Past Medical History:  Diagnosis Date  . Arthritis    neck, shoulders, hips, knees  . Asthma    as child, out grew  . Barrett esophagus   . BPH (benign prostatic hyperplasia)   . Coronary artery disease    annual stress with Dr. Humphrey Rolls. no new findings  . Coronary artery disease   . Dementia ([place])   . Depression   . Diabetes mellitus without complication (HCC)    no medications currently was on insulin but was dropping blood sugars too low causing syncope  . Diverticulosis   . Duodenitis   . Gastritis   . GERD (gastroesophageal reflux disease)   . History of COVID-19 02/2019  . History of kidney stones   . Hypercholesteremia   . Hypertension   . Left renal mass   . Leg fracture, right    wears brace  . Neuromuscular disorder (HCC)    neuropathy - feet  . Occasional tremors    hands  . Prostate cancer ([place])   . Renal insufficiency   . Sleep apnea    doesn't wear CPAP, pt denies  . Wears dentures    full upper    Family History: Family History  Problem Relation Age of Onset  . Prostate cancer Father   . Heart disease Brother   . Stroke Mother   . Breast cancer Sister     Social History   Socioeconomic History  . Marital status: Widowed    Spouse name: Not on file  . Number of children: Not  on file  . Years of education: Not on file  . Highest education level: Not on file  Occupational History  . Not on file  Tobacco Use  . Smoking status: Former Smoker    Packs/day: 1.00    Years: 20.00    Pack years: 20.00    Types: Cigarettes, Pipe, Cigars    Quit date: 03/21/1973    Years since quitting: 46.7  . Smokeless tobacco: Current User  Types: Snuff  Vaping Use  . Vaping Use: Never used  Substance and Sexual Activity  . Alcohol use: Yes    Alcohol/week: 25.0 standard drinks    Types: 25 Cans of beer per week    Comment: 4-6 a day  . Drug use: Never  . Sexual activity: Not Currently  Other Topics Concern  . Not on file  Social History Narrative   ** Merged History Encounter **       Social Determinants of Health   Financial Resource Strain:   . Difficulty of Paying Living Expenses: Not on file  Food Insecurity:   . Worried About Charity fundraiser in the Last Year: Not on file  . Ran Out of Food in the Last Year: Not on file  Transportation Needs:   . Lack of Transportation (Medical): Not on file  . Lack of Transportation (Non-Medical): Not on file  Physical Activity:   . Days of Exercise per Week: Not on file  . Minutes of Exercise per Session: Not on file  Stress:   . Feeling of Stress : Not on file  Social Connections:   . Frequency of Communication with Friends and Family: Not on file  . Frequency of Social Gatherings with Friends and Family: Not on file  . Attends Religious Services: Not on file  . Active Member of Clubs or Organizations: Not on file  . Attends Archivist Meetings: Not on file  . Marital Status: Not on file  Intimate Partner Violence:   . Fear of Current or Ex-Partner: Not on file  . Emotionally Abused: Not on file  . Physically Abused: Not on file  . Sexually Abused: Not on file      Review of Systems  Constitutional: Positive for appetite change and fatigue.  HENT: Negative for sinus pain and sore throat.    Respiratory: Positive for cough, chest tightness and shortness of breath.   Cardiovascular: Negative for palpitations and leg swelling.  Genitourinary: Negative.   Musculoskeletal: Negative.     Vital Signs: BP 140/68   Pulse (!) 102   Temp 97.8 F (36.6 C)   Resp 16   Ht 6' (1.829 m)   SpO2 96%   BMI 19.75 kg/m    Physical Exam HENT:     Head: Normocephalic and atraumatic.  Eyes:     Extraocular Movements: Extraocular movements intact.     Pupils: Pupils are equal, round, and reactive to light.  Cardiovascular:     Rate and Rhythm: Tachycardia present.     Heart sounds: Normal heart sounds.  Pulmonary:     Effort: Respiratory distress (mild ) present.     Comments: Decreased air entry bilateral  Skin:    General: Skin is warm.  Neurological:     Mental Status: He is alert.    Assessment/Plan: 1. SOB (shortness of breath) - Spirometry with Graph  2. Acute bronchitis with COPD (HCC) - Will change it to mask for better delivery for resp  - levofloxacin (LEVAQUIN) 500 MG tablet; Take 1 tablet (500 mg total) by mouth daily.  Dispense: 7 tablet; Refill:  - predniSONE (DELTASONE) 10 MG tablet; Take one tab 3 x day for 3 days, then take one tab 2 x a day for 3 days and then take one tab a day for 3 days for copd  Dispense: 18 tablet; Refill: 0 - Respiratory Therapy Supplies (SOOTHENEB NBL 100 ADULT MASK) MISC; Use with breathing treatment ( pt with dementia  has problem inhaling with mouth piece )  Dispense: 4 each; Refill: 2  General Counseling: Benecio verbalizes understanding of the findings of todays visit and agrees with plan of treatment. I have discussed any further diagnostic evaluation that may be needed or ordered today. We also reviewed his medications today. he has been encouraged to call the office with any questions or concerns that should arise related to todays visit.   Orders Placed This Encounter  Procedures  . Spirometry with Graph    Meds ordered this  encounter  Medications  . levofloxacin (LEVAQUIN) 500 MG tablet    Sig: Take 1 tablet (500 mg total) by mouth daily.    Dispense:  7 tablet    Refill:  0  . predniSONE (DELTASONE) 10 MG tablet    Sig: Take one tab 3 x day for 3 days, then take one tab 2 x a day for 3 days and then take one tab a day for 3 days for copd    Dispense:  18 tablet    Refill:  0  . Respiratory Therapy Supplies (SOOTHENEB NBL 100 ADULT MASK) MISC    Sig: Use with breathing treatment ( pt with dementia has problem inhaling with mouth piece )    Dispense:  4 each    Refill:  2    Total time spent:30 Minutes Time spent includes review of chart, medications, test results, and follow up plan with the patient.    Dr Lavera Guise Internal medicine

## 2020-01-27 ENCOUNTER — Other Ambulatory Visit: Payer: Self-pay

## 2020-01-27 ENCOUNTER — Encounter: Payer: Self-pay | Admitting: Internal Medicine

## 2020-01-27 ENCOUNTER — Ambulatory Visit (INDEPENDENT_AMBULATORY_CARE_PROVIDER_SITE_OTHER): Payer: Medicare Other | Admitting: Internal Medicine

## 2020-01-27 VITALS — BP 110/77 | HR 100 | Temp 98.3°F | Resp 16 | Ht 71.0 in | Wt 150.0 lb

## 2020-01-27 DIAGNOSIS — N183 Chronic kidney disease, stage 3 unspecified: Secondary | ICD-10-CM

## 2020-01-27 DIAGNOSIS — I25811 Atherosclerosis of native coronary artery of transplanted heart without angina pectoris: Secondary | ICD-10-CM | POA: Diagnosis not present

## 2020-01-27 DIAGNOSIS — F0390 Unspecified dementia without behavioral disturbance: Secondary | ICD-10-CM

## 2020-01-27 DIAGNOSIS — J4520 Mild intermittent asthma, uncomplicated: Secondary | ICD-10-CM | POA: Diagnosis not present
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
## 2020-01-27 NOTE — Patient Instructions (Signed)
Asthma, Adult  Asthma is a long-term (chronic) condition in which the airways get tight and narrow. The airways are the breathing passages that lead from the nose and mouth down into the lungs. A person with asthma will have times when symptoms get worse. These are called asthma attacks. They can cause coughing, whistling sounds when you breathe (wheezing), shortness of breath, and chest pain. They can make it hard to breathe. There is no cure for asthma, but medicines and lifestyle changes can help control it. There are many things that can bring on an asthma attack or make asthma symptoms worse (triggers). Common triggers include:  Mold.  Dust.  Cigarette smoke.  Cockroaches.  Things that can cause allergy symptoms (allergens). These include animal skin flakes (dander) and pollen from trees or grass.  Things that pollute the air. These may include household cleaners, wood smoke, smog, or chemical odors.  Cold air, weather changes, and wind.  Crying or laughing hard.  Stress.  Certain medicines or drugs.  Certain foods such as dried fruit, potato chips, and grape juice.  Infections, such as a cold or the flu.  Certain medical conditions or diseases.  Exercise or tiring activities. Asthma may be treated with medicines and by staying away from the things that cause asthma attacks. Types of medicines may include:  Controller medicines. These help prevent asthma symptoms. They are usually taken every day.  Fast-acting reliever or rescue medicines. These quickly relieve asthma symptoms. They are used as needed and provide short-term relief.  Allergy medicines if your attacks are brought on by allergens.  Medicines to help control the body's defense (immune) system. Follow these instructions at home: Avoiding triggers in your home  Change your heating and air conditioning filter often.  Limit your use of fireplaces and wood stoves.  Get rid of pests (such as roaches and  mice) and their droppings.  Throw away plants if you see mold on them.  Clean your floors. Dust regularly. Use cleaning products that do not smell.  Have someone vacuum when you are not home. Use a vacuum cleaner with a HEPA filter if possible.  Replace carpet with wood, tile, or vinyl flooring. Carpet can trap animal skin flakes and dust.  Use allergy-proof pillows, mattress covers, and box spring covers.  Wash bed sheets and blankets every week in hot water. Dry them in a dryer.  Keep your bedroom free of any triggers.  Avoid pets and keep windows closed when things that cause allergy symptoms are in the air.  Use blankets that are made of polyester or cotton.  Clean bathrooms and kitchens with bleach. If possible, have someone repaint the walls in these rooms with mold-resistant paint. Keep out of the rooms that are being cleaned and painted.  Wash your hands often with soap and water. If soap and water are not available, use hand sanitizer.  Do not allow anyone to smoke in your home. General instructions  Take over-the-counter and prescription medicines only as told by your doctor. ? Talk with your doctor if you have questions about how or when to take your medicines. ? Make note if you need to use your medicines more often than usual.  Do not use any products that contain nicotine or tobacco, such as cigarettes and e-cigarettes. If you need help quitting, ask your doctor.  Stay away from secondhand smoke.  Avoid doing things outdoors when allergen counts are high and when air quality is low.  Wear a ski mask   when doing outdoor activities in the winter. The mask should cover your nose and mouth. Exercise indoors on cold days if you can.  Warm up before you exercise. Take time to cool down after exercise.  Use a peak flow meter as told by your doctor. A peak flow meter is a tool that measures how well the lungs are working.  Keep track of the peak flow meter's readings.  Write them down.  Follow your asthma action plan. This is a written plan for taking care of your asthma and treating your attacks.  Make sure you get all the shots (vaccines) that your doctor recommends. Ask your doctor about a flu shot and a pneumonia shot.  Keep all follow-up visits as told by your doctor. This is important. Contact a doctor if:  You have wheezing, shortness of breath, or a cough even while taking medicine to prevent attacks.  The mucus you cough up (sputum) is thicker than usual.  The mucus you cough up changes from clear or white to yellow, green, gray, or bloody.  You have problems from the medicine you are taking, such as: ? A rash. ? Itching. ? Swelling. ? Trouble breathing.  You need reliever medicines more than 2-3 times a week.  Your peak flow reading is still at 50-79% of your personal best after following the action plan for 1 hour.  You have a fever. Get help right away if:  You seem to be worse and are not responding to medicine during an asthma attack.  You are short of breath even at rest.  You get short of breath when doing very little activity.  You have trouble eating, drinking, or talking.  You have chest pain or tightness.  You have a fast heartbeat.  Your lips or fingernails start to turn blue.  You are light-headed or dizzy, or you faint.  Your peak flow is less than 50% of your personal best.  You feel too tired to breathe normally. Summary  Asthma is a long-term (chronic) condition in which the airways get tight and narrow. An asthma attack can make it hard to breathe.  Asthma cannot be cured, but medicines and lifestyle changes can help control it.  Make sure you understand how to avoid triggers and how and when to use your medicines. This information is not intended to replace advice given to you by your health care provider. Make sure you discuss any questions you have with your health care provider. Document Revised:  05/10/2018 Document Reviewed: 04/11/2016 Elsevier Patient Education  2020 [employer] Inc.  

## 2020-01-30 ENCOUNTER — Telehealth: Payer: Self-pay

## 2020-01-30 NOTE — Telephone Encounter (Signed)
Received message from patient's brother requesting a return call to schedule visit with Palliative NP, Amy.   VM left for patient's brother with purpose of call and contact info.

## 2020-02-03 ENCOUNTER — Other Ambulatory Visit: Payer: Medicare Other | Admitting: Adult Health Nurse Practitioner

## 2020-02-03 ENCOUNTER — Other Ambulatory Visit: Payer: Self-pay

## 2020-02-03 DIAGNOSIS — F0391 Unspecified dementia with behavioral disturbance: Secondary | ICD-10-CM

## 2020-02-03 DIAGNOSIS — Z515 Encounter for palliative care: Secondary | ICD-10-CM

## 2020-02-03 NOTE — Progress Notes (Signed)
[facility] Consult Note Telephone: [phone]  Fax: [phone]  PATIENT NAME: Todd Davidson DOB: 09/07/1935 MRN: [id number]  PRIMARY CARE PROVIDER:   Perrin Maltese, MD  REFERRING PROVIDER:  Evern Bio NP  RESPONSIBLE PARTY:   Alakai Macbride, brother [phone]    RECOMMENDATIONS and PLAN:  1.  Advanced care planning. Has advance directives uploaded in [place].  2.  Dementia.  Patient able to ambulate with a walker.  Unable to do IADLs.  Has caregivers that come in most of everyday and spends nights alone.  Brother does not express any concerns with him spending nights alone.  He does have a caregiver that helps him with his shower.  Is able to go to bathroom on his own.  He is able to follow commands.Brother does state that he is becoming more sedentary.  Brother does have concerns that he still drinks beer. Family limits him to 3 beers a day.  Brother states that he likes to go into [place] and socialize.  Discussed disease progression and that over time as the dementia worsens that he will not be able to go out and socialize and hopefully he will have decreased interest in beer.  Patient states that he has had a fall some time ago (unsure when) in which he slipped getting out of the shower.  States that his family has placed something to keep him from slipping.  No injury reported with the fall.  Continue supportive care at home.  Patient having some disease progression. Family has more caregivers in place and will continue to assess for when more help is needed or possible placement in LTC.  Palliative will continue to monitor for symptom management/decline and make recommendations as needed.  Next visit in 6 weeks.  Brother encouraged to call with any questions or concerns.  I spent 30 minutes providing this consultation,  from 4:00 to 4:30 including time with patient/family, chart review, provider coordination, and  documentation. More than 50% of the time in this consultation was spent coordinating communication.   HISTORY OF PRESENT ILLNESS:  Todd Davidson is a 84 y.o. year old adult with multiple medical problems including dementia, CAD, prostate cancer, renal mass, asthma, HTN, Barrett's esophagus. Palliative Care was asked to help address goals of care. Since last visit patient has had gout flare up and had to have his neb treatments changed to a mask instead of a mouth piece as his dementia progresses and it is easier to use the mask.  Patient denies pain, dizziness, increased SOB or cough, N/V/D, constipation, dysuria, hematuria, hematochezia.    CODE STATUS: see above  PPS: 50% HOSPICE ELIGIBILITY/DIAGNOSIS: TBD  PHYSICAL EXAM: BP 118/62HR71O2 95% on RA General: NAD, frail appearing Cardiovascular: regular rate and rhythm Pulmonary:lung sounds clear; normal respiratory effort Extremities:traceedemato bilateral feet,braceto right knee Skin: no rasheson exposed skin Neurological: Weakness; Has forgetfulness.  PAST MEDICAL HISTORY:  Past Medical History:  Diagnosis Date  . Arthritis    neck, shoulders, hips, knees  . Asthma    as child, out grew  . Barrett esophagus   . BPH (benign prostatic hyperplasia)   . Coronary artery disease    annual stress with Dr. Humphrey Rolls. no new findings  . Coronary artery disease   . Dementia ([place])   . Depression   . Diabetes mellitus without complication (HCC)    no medications currently was on insulin but was dropping blood sugars too low causing syncope  . Diverticulosis   .  Duodenitis   . Gastritis   . GERD (gastroesophageal reflux disease)   . History of COVID-19 02/2019  . History of kidney stones   . Hypercholesteremia   . Hypertension   . Left renal mass   . Leg fracture, right    wears brace  . Neuromuscular disorder (HCC)    neuropathy - feet  . Occasional tremors    hands  . Prostate cancer ([place])   . Renal insufficiency    . Sleep apnea    doesn't wear CPAP, pt denies  . Wears dentures    full upper    SOCIAL HX:  Social History   Tobacco Use  . Smoking status: Former Smoker    Packs/day: 1.00    Years: 20.00    Pack years: 20.00    Types: Cigarettes, Pipe, Cigars    Quit date: 03/21/1973    Years since quitting: 46.9  . Smokeless tobacco: Current User    Types: Snuff  Substance Use Topics  . Alcohol use: Yes    Alcohol/week: 25.0 standard drinks    Types: 25 Cans of beer per week    Comment: 4-6 a day    ALLERGIES:  Allergies  Allergen Reactions  . Macrolides And Ketolides Swelling    "-mycin" drugs "-mycin" drugs "-mycin" drugs   . Erythromycin     Other reaction(s): Unknown  . Propranolol   . Sulfa Antibiotics Swelling and Other (See Comments)    Other reaction(s): Other (See Comments)  . Sulfa Antibiotics Swelling  . Sulfasalazine Swelling  . Tape Other (See Comments)    Irritates the skin - raw  . Tapentadol Other (See Comments)    Heavy tape causes raw skin  . Tapentadol   . Telbivudine Swelling    "mycin" drugs   . Tape Other (See Comments) and Rash    Heavy tape causes raw skin     PERTINENT MEDICATIONS:  Outpatient Encounter Medications as of 02/03/2020  Medication Sig  . ACCU-CHEK AVIVA PLUS test strip USE 1 STRIP TO CHECK GLUCOSE 4 TIMES DAILY  . acetaminophen (TYLENOL) 325 MG tablet Take 2 tablets (650 mg total) by mouth every 6 (six) hours as needed for mild pain.  Marland Kitchen amLODipine (NORVASC) 10 MG tablet Take 10 mg by mouth daily.  Marland Kitchen aspirin 325 MG tablet Take 325 mg by mouth daily. AM  . benazepril (LOTENSIN) 40 MG tablet Take 40 mg by mouth at bedtime.   . citalopram (CELEXA) 10 MG tablet Take 10 mg by mouth at bedtime.   . clotrimazole (LOTRIMIN) 1 % cream Apply 1 application topically 2 (two) times daily.  . finasteride (PROSCAR) 5 MG tablet TAKE 1 TABLET EVERY DAY (Patient taking differently: Take 5 mg by mouth at bedtime. )  . gabapentin (NEURONTIN) 300 MG  capsule Take 300 mg by mouth 2 (two) times daily.   . heparin 5000 UNIT/ML injection Inject 1 mL (5,000 Units total) into the skin every 8 (eight) hours.  Marland Kitchen ipratropium-albuterol (DUONEB) 0.5-2.5 (3) MG/3ML SOLN Take 3 mLs by nebulization every 6 (six) hours as needed.  Marland Kitchen levofloxacin (LEVAQUIN) 500 MG tablet Take 1 tablet (500 mg total) by mouth daily.  . methocarbamol (ROBAXIN) 500 MG tablet Take 1 tablet (500 mg total) by mouth every 8 (eight) hours as needed for muscle spasms.  . nitroGLYCERIN (NITROSTAT) 0.4 MG SL tablet Place under the tongue.  Marland Kitchen omeprazole (PRILOSEC) 40 MG capsule Take 40 mg by mouth daily.   . predniSONE (DELTASONE) 10  MG tablet Take one tab 3 x day for 3 days, then take one tab 2 x a day for 3 days and then take one tab a day for 3 days for copd  . Respiratory Therapy Supplies (SOOTHENEB NBL 100 ADULT MASK) MISC Use with breathing treatment ( pt with dementia has problem inhaling with mouth piece )  . rosuvastatin (CRESTOR) 40 MG tablet Take 40 mg by mouth at bedtime.   . tamsulosin (FLOMAX) 0.4 MG CAPS capsule TAKE 1 CAPSULE (0.4 MG TOTAL) BY MOUTH DAILY AFTER BREAKFAST. (Patient taking differently: Take 0.4 mg by mouth daily. )   No facility-administered encounter medications on file as of 02/03/2020.     Kallie Depolo Jenetta Downer, NP

## 2020-02-19 ENCOUNTER — Other Ambulatory Visit: Payer: Self-pay | Admitting: Urology

## 2020-03-05 ENCOUNTER — Encounter: Payer: Self-pay | Admitting: Urology

## 2020-03-05 ENCOUNTER — Ambulatory Visit (INDEPENDENT_AMBULATORY_CARE_PROVIDER_SITE_OTHER): Payer: Medicare Other | Admitting: Urology

## 2020-03-05 ENCOUNTER — Other Ambulatory Visit: Payer: Self-pay

## 2020-03-05 VITALS — BP 131/78 | HR 118 | Ht 71.0 in | Wt 140.0 lb

## 2020-03-05 DIAGNOSIS — C61 Malignant neoplasm of prostate: Secondary | ICD-10-CM | POA: Diagnosis not present

## 2020-03-05 DIAGNOSIS — C649 Malignant neoplasm of unspecified kidney, except renal pelvis: Secondary | ICD-10-CM | POA: Diagnosis not present

## 2020-03-05 NOTE — Progress Notes (Signed)
03/05/2020 10:18 AM   Thomasenia Bottoms Hayes Jan 15, 1936 [id number]  Referring provider: Perrin Maltese, MD [address] [place],  [place] [address]  Chief Complaint  Patient presents with  . Prostate Cancer    Urologic history: 1.  Clinical T1c intermediate risk (favorable) prostate cancer  -PSA 13.6; MRI PI-RADS 4 lesion; 1/2 cores positive Gleason 3+4 adenocarcinoma -2/12 cores positive Gleason 3+3 on standard template -Elected IMRT completed July 2020  2.  3.5 cm enhancing left renal mass -Incidentally discovered abdominal CT  -Percutaneous cryoablation by IR March 2021 -Renal biopsy + clear-cell carcinoma -Follow-up CT by IR July 2020 showed no evidence of enhancement or disease recurrence   HPI: 84 y.o. male presents for annual follow-up.   States he is doing fairly well and denies bothersome lower urinary tract symptoms  Denies dysuria, gross hematuria  No flank, abdominal or pelvic pain  Saw Dr. Baruch Gouty June 2021 and PSA 1.15  Follow-up CT by IR as above   PMH: Past Medical History:  Diagnosis Date  . Arthritis    neck, shoulders, hips, knees  . Asthma    as child, out grew  . Barrett esophagus   . BPH (benign prostatic hyperplasia)   . Coronary artery disease    annual stress with Dr. Humphrey Rolls. no new findings  . Coronary artery disease   . Dementia ([place])   . Depression   . Diabetes mellitus without complication (HCC)    no medications currently was on insulin but was dropping blood sugars too low causing syncope  . Diverticulosis   . Duodenitis   . Gastritis   . GERD (gastroesophageal reflux disease)   . History of COVID-19 02/2019  . History of kidney stones   . Hypercholesteremia   . Hypertension   . Left renal mass   . Leg fracture, right    wears brace  . Neuromuscular disorder (HCC)    neuropathy - feet  . Occasional tremors    hands  . Prostate cancer ([place])   . Renal insufficiency   . Sleep apnea    doesn't wear CPAP, pt denies  .  Wears dentures    full upper    Surgical History: Past Surgical History:  Procedure Laterality Date  . APPENDECTOMY    . CARDIAC CATHETERIZATION  1999, 2000   stents placed  . CATARACT EXTRACTION W/PHACO Left 10/29/2014   Procedure: CATARACT EXTRACTION PHACO AND INTRAOCULAR LENS PLACEMENT (IOC);  Surgeon: Leandrew Koyanagi, MD;  Location: [facility];  Service: Ophthalmology;  Laterality: Left;  DIABETIC - insulin MALYUGIN  . CERVICAL FUSION    . CORONARY ANGIOPLASTY    . ESOPHAGOGASTRODUODENOSCOPY N/A 12/29/2017   Procedure: ESOPHAGOGASTRODUODENOSCOPY (EGD);  Surgeon: [name], MD;  [facility] ENDOSCOPY;  Service: Endoscopy;  Laterality: N/A;  . ESOPHAGOGASTRODUODENOSCOPY (EGD) WITH PROPOFOL N/A 12/17/2015   Procedure: ESOPHAGOGASTRODUODENOSCOPY (EGD) WITH PROPOFOL;  Surgeon: [name], MD;  Location: [facility] ENDOSCOPY;  Service: Endoscopy;  Laterality: N/A;  . EYE SURGERY Right   . HERNIA REPAIR     x2  . I & D EXTREMITY Right 10/31/2018   Procedure: IRRIGATION AND DEBRIDEMENT RIGHT KNEE  EXTERNAL FIXATION OF FRACTURE;  Surgeon: Shona Needles, MD;  Location: [facility];  Service: Orthopedics;  Laterality: Right;  IRRIGATION AND DEBRIDEMENT RIGHT KNEE  EXTERNAL FIXATION OF FRACTURE  . IR RADIOLOGIST EVAL & MGMT  02/28/2019  . IR RADIOLOGIST EVAL & MGMT  04/30/2019  . IR RADIOLOGIST EVAL & MGMT  07/02/2019  .  IR RADIOLOGIST EVAL & MGMT  10/10/2019  . IRRIGATION AND DEBRIDEMENT KNEE Right 10/31/2018  . JOINT REPLACEMENT     knee  . Chaves   . KNEE SURGERY Right    joint removal with fusion and graft  . NISSEN FUNDOPLICATION    . RADIOFREQUENCY ABLATION Left 06/05/2019   Procedure: LEFT RENAL  CRYO ABLATION;  Surgeon: Greggory Keen, MD;  Location: [facility];  Service: Anesthesiology;  Laterality: Left;    Home Medications:  Allergies as of 03/05/2020      Reactions   Macrolides And Ketolides Swelling   "-mycin" drugs "-mycin" drugs "-mycin"  drugs   Erythromycin    Other reaction(s): Unknown   Propranolol    Sulfa Antibiotics Swelling, Other (See Comments)   Other reaction(s): Other (See Comments)   Sulfa Antibiotics Swelling   Sulfasalazine Swelling   Tape Other (See Comments)   Irritates the skin - raw   Tapentadol Other (See Comments)   Heavy tape causes raw skin   Tapentadol    Telbivudine Swelling   "mycin" drugs   Tape Other (See Comments), Rash   Heavy tape causes raw skin      Medication List       Accurate as of March 05, 2020 10:18 AM. If you have any questions, ask your nurse or doctor.        Accu-Chek Aviva Plus test strip Generic drug: glucose blood USE 1 STRIP TO CHECK GLUCOSE 4 TIMES DAILY   acetaminophen 325 MG tablet Commonly known as: TYLENOL Take 2 tablets (650 mg total) by mouth every 6 (six) hours as needed for mild pain.   amLODipine 10 MG tablet Commonly known as: NORVASC Take 10 mg by mouth daily.   aspirin 325 MG tablet Take 325 mg by mouth daily. AM   benazepril 40 MG tablet Commonly known as: LOTENSIN Take 40 mg by mouth at bedtime.   citalopram 10 MG tablet Commonly known as: CELEXA Take 10 mg by mouth at bedtime.   clotrimazole 1 % cream Commonly known as: LOTRIMIN Apply 1 application topically 2 (two) times daily.   finasteride 5 MG tablet Commonly known as: PROSCAR TAKE 1 TABLET EVERY DAY What changed: when to take this   gabapentin 300 MG capsule Commonly known as: NEURONTIN Take 300 mg by mouth 2 (two) times daily.   heparin 5000 UNIT/ML injection Inject 1 mL (5,000 Units total) into the skin every 8 (eight) hours.   ipratropium-albuterol 0.5-2.5 (3) MG/3ML Soln Commonly known as: DUONEB Take 3 mLs by nebulization every 6 (six) hours as needed.   levofloxacin 500 MG tablet Commonly known as: LEVAQUIN Take 1 tablet (500 mg total) by mouth daily.   methocarbamol 500 MG tablet Commonly known as: ROBAXIN Take 1 tablet (500 mg total) by mouth every  8 (eight) hours as needed for muscle spasms.   nitroGLYCERIN 0.4 MG SL tablet Commonly known as: NITROSTAT Place under the tongue.   omeprazole 40 MG capsule Commonly known as: PRILOSEC Take 40 mg by mouth daily.   predniSONE 10 MG tablet Commonly known as: DELTASONE Take one tab 3 x day for 3 days, then take one tab 2 x a day for 3 days and then take one tab a day for 3 days for copd   rosuvastatin 40 MG tablet Commonly known as: CRESTOR Take 40 mg by mouth at bedtime.   SootheNeb NBL 100 Adult Mask Misc Use with breathing treatment ( pt with  dementia has problem inhaling with mouth piece )   tamsulosin 0.4 MG Caps capsule Commonly known as: FLOMAX TAKE 1 CAPSULE (0.4 MG TOTAL) BY MOUTH DAILY AFTER BREAKFAST. What changed: See the new instructions.       Allergies:  Allergies  Allergen Reactions  . Macrolides And Ketolides Swelling    "-mycin" drugs "-mycin" drugs "-mycin" drugs   . Erythromycin     Other reaction(s): Unknown  . Propranolol   . Sulfa Antibiotics Swelling and Other (See Comments)    Other reaction(s): Other (See Comments)  . Sulfa Antibiotics Swelling  . Sulfasalazine Swelling  . Tape Other (See Comments)    Irritates the skin - raw  . Tapentadol Other (See Comments)    Heavy tape causes raw skin  . Tapentadol   . Telbivudine Swelling    "mycin" drugs   . Tape Other (See Comments) and Rash    Heavy tape causes raw skin    Family History: Family History  Problem Relation Age of Onset  . Prostate cancer Father   . Heart disease Brother   . Stroke Mother   . Breast cancer Sister     Social History:  reports that he quit smoking about 46 years ago. His smoking use included cigarettes, pipe, and cigars. He has a 20.00 pack-year smoking history. His smokeless tobacco use includes snuff. He reports current alcohol use of about 25.0 standard drinks of alcohol per week. He reports that he does not use drugs.   Physical Exam: BP 131/78    Pulse (!) 118   Ht 5\' 11"  (1.803 m)   Wt 140 lb (63.5 kg)   BMI 19.53 kg/m   Constitutional:  Alert, No acute distress. HEENT: Elim AT, moist mucus membranes.  Trachea midline, no masses. Cardiovascular: No clubbing, cyanosis, or edema. Respiratory: Normal respiratory effort, no increased work of breathing.   Assessment & Plan:    1.  T1c intermediate risk prostate cancer  Status post IMRT  Repeat PSA today  Radiation oncology follow-up schedule June 2022  Follow-up with me 1 year  2.  Renal cell carcinoma  Saw IR July 2021 and follow-up CT to be scheduled March 2022   Abbie Sons, MD  [facility] [address], [place], [place] [address] [phone]

## 2020-03-06 ENCOUNTER — Telehealth: Payer: Self-pay | Admitting: *Deleted

## 2020-03-06 LAB — PSA: Prostate Specific Ag, Serum: 0.8 ng/mL (ref 0.0–4.0)

## 2020-03-06 NOTE — Telephone Encounter (Signed)
-----   Message from Abbie Sons, MD sent at 03/06/2020  7:48 AM EST ----- PSA continues to decrease after radiation and was 0.8

## 2020-03-06 NOTE — Telephone Encounter (Signed)
Notified patient as instructed, patient pleased. Discussed follow-up appointments, patient agrees  

## 2020-03-11 ENCOUNTER — Inpatient Hospital Stay: Payer: Medicare Other | Attending: Radiation Oncology

## 2020-03-11 DIAGNOSIS — C61 Malignant neoplasm of prostate: Secondary | ICD-10-CM | POA: Diagnosis not present

## 2020-03-11 DIAGNOSIS — D696 Thrombocytopenia, unspecified: Secondary | ICD-10-CM | POA: Insufficient documentation

## 2020-03-11 LAB — PSA: Prostatic Specific Antigen: 0.83 ng/mL (ref 0.00–4.00)

## 2020-03-17 ENCOUNTER — Encounter: Payer: Self-pay | Admitting: Radiation Oncology

## 2020-03-17 ENCOUNTER — Other Ambulatory Visit: Payer: Medicare Other | Admitting: Adult Health Nurse Practitioner

## 2020-03-18 ENCOUNTER — Ambulatory Visit
Admission: RE | Admit: 2020-03-18 | Discharge: 2020-03-18 | Disposition: A | Discharge status: Home / Self Care | Payer: Medicare Other | Source: Ambulatory Visit | Attending: Radiation Oncology | Admitting: Radiation Oncology

## 2020-03-18 ENCOUNTER — Other Ambulatory Visit: Payer: Self-pay

## 2020-03-18 ENCOUNTER — Other Ambulatory Visit: Payer: Self-pay | Admitting: *Deleted

## 2020-03-18 VITALS — BP 148/77 | HR 87 | Temp 97.7°F | Wt 143.9 lb

## 2020-03-18 DIAGNOSIS — Z923 Personal history of irradiation: Secondary | ICD-10-CM | POA: Diagnosis not present

## 2020-03-18 DIAGNOSIS — C61 Malignant neoplasm of prostate: Secondary | ICD-10-CM | POA: Insufficient documentation

## 2020-03-18 DIAGNOSIS — Z85528 Personal history of other malignant neoplasm of kidney: Secondary | ICD-10-CM | POA: Diagnosis not present

## 2020-03-18 NOTE — Progress Notes (Signed)
Radiation Oncology Follow up Note  Name: Todd Davidson   Date:   03/18/2020 MRN:  [id number] DOB: [birth year]    This 84 y.o. adult presents to the clinic today for 74-month follow-up status post IMRT radiation therapy for stage IIb Gleason 7 (3+4) adenocarcinoma the prostate presenting with a PSA of 13.6.  REFERRING PROVIDER: Margaretann Loveless, MD  HPI: Patient is a 84 year old male now out 14 months having completed IMRT radiation therapy for Gleason 7 adenocarcinoma the prostate seen today in routine follow-up he is doing well. Multiple somatic complaints although no increased lower urinary tract symptoms diarrhea or fatigue. His most recent PSA is. 0.83 continued downward trend from 1.1 back 6 months prior. Patient is also a cryoablation of his left upper pole renal cell carcinoma had a CT scan back in July which I reviewed shows no signs of recurrence at this time.  COMPLICATIONS OF TREATMENT: none  FOLLOW UP COMPLIANCE: keeps appointments   PHYSICAL EXAM:  BP (!) 148/77   Pulse 87   Temp 97.7 F (36.5 C) (Tympanic)   Wt 143 lb 14.4 oz (65.3 kg)   BMI 20.07 kg/m  Frail-appearing elderly gentleman in NAD. Well-developed well-nourished patient in NAD. HEENT reveals PERLA, EOMI, discs not visualized.  Oral cavity is clear. No oral mucosal lesions are identified. Neck is clear without evidence of cervical or supraclavicular adenopathy. Lungs are clear to A&P. Cardiac examination is essentially unremarkable with regular rate and rhythm without murmur rub or thrill. Abdomen is benign with no organomegaly or masses noted. Motor sensory and DTR levels are equal and symmetric in the upper and lower extremities. Cranial nerves II through XII are grossly intact. Proprioception is intact. No peripheral adenopathy or edema is identified. No motor or sensory levels are noted. Crude visual fields are within normal range.  RADIOLOGY RESULTS: CT scan of abdomen pelvis reviewed compatible with  above-stated findings  PLAN: At the present time patient is doing well under excellent biochemical control of his prostate cancer. And pleased with his overall progress  To see him back in 1 year for follow-up with a PSA at that time. Patient knows to call with any concerns.  I would like to take this opportunity to thank you for allowing me to participate in the care of your patient.Carmina Miller, MD

## 2020-04-14 ENCOUNTER — Other Ambulatory Visit: Payer: Self-pay | Admitting: Adult Health Nurse Practitioner

## 2020-04-14 ENCOUNTER — Other Ambulatory Visit: Payer: Self-pay

## 2020-04-14 DIAGNOSIS — C61 Malignant neoplasm of prostate: Secondary | ICD-10-CM

## 2020-04-14 DIAGNOSIS — F0391 Unspecified dementia with behavioral disturbance: Secondary | ICD-10-CM

## 2020-04-14 DIAGNOSIS — S1190XA Unspecified open wound of unspecified part of neck, initial encounter: Secondary | ICD-10-CM

## 2020-04-14 DIAGNOSIS — Z515 Encounter for palliative care: Secondary | ICD-10-CM

## 2020-04-14 NOTE — Progress Notes (Signed)
[facility] Consult Note Telephone: [phone]  Fax: [phone]  PATIENT NAME: Todd Davidson DOB: 12/29/1935 MRN: [id number]  PRIMARY CARE PROVIDER:   Perrin Maltese, MD  REFERRING PROVIDER: Evern Bio NP  Todd Davidson, brother [phone]  Chief complaint: follow up palliative visit/increased cognitive decline  Called prior to visit for COVID screening  RECOMMENDATIONS and PLAN: 1.Advanced care planning. Has advance directives uploaded in [place].  2.  Dementia.  Patient has had no changes in functional status. Appetite good and weight stable in the 140s.  Had discussion about ALF versus SNF.  Brother does state that the patient is the one who has brought it up in the past.  The patient is engaged in conversation about ALF and does not seem opposed to this idea at this time.     3.  Prostate cancer.  Being followed by urology and recent PSA 0.68.  Disease is stable with no signs of recurrence at this time.  Continue follow up with urology.  4.  Wound to neck.  Believed to be due to pressure from neck massager.  He has stopped using this.  Has Aquaphor healing cream in the home that he can use on the area.  No signs or symptoms of infection at the sites and instructed that they could use triple antibiotic cream if needed.    Palliative will continue to monitor for symptom management/decline and make recommendations as needed.  Possibly looking into placement in ALF and would like to reach out to provider as needed at this time.  Patient does have caregivers in the home and brother feels he is doing well right now. Will reach out to SW if needed for transition.  Encouraged to call with any questions or concerns.    HISTORY OF PRESENT ILLNESS:  Todd Davidson is a 85 y.o. year old adult with multiple medical problems including dementia, CAD, prostate cancer, renal mass, asthma, HTN, Barrett's esophagus.  Palliative Care was asked to help address goals of care. Patient has no new concerns today.  Brother present for visit today and inquiring about ALF versus SNF.  Patient does have 2 wounds to back of neck believed to be caused by neck massager that he received for Christmas.  The massager does have 2 protruding areas that make contact with the back of the neck.  He has stopped using this.  Denies pain.  Rest of 10 point ROS asked and negative.  CODE STATUS: see above  PPS: 50% HOSPICE ELIGIBILITY/DIAGNOSIS: TBD  PHYSICAL EXAM: BP 122/58HR70O2 95% on RA General: NAD, frail appearing Eyes: sclera anicteric and noninjected with no discharge noted ENMT: moist mucous membranes Cardiovascular: regular rate and rhythm Pulmonary:lung sounds clear; normal respiratory effort Abdomen: soft, nontender, + bowel sounds Extremities:traceedemato bilateral feet,braceto right knee Skin: no rasheson exposed skin; back of neck with 2 circular wounds that are scabbed over with pink borders with no discharge or warmth to touch noted Neurological: Weakness; A&O to person and place  PAST MEDICAL HISTORY:  Past Medical History:  Diagnosis Date  . Arthritis    neck, shoulders, hips, knees  . Asthma    as child, out grew  . Barrett esophagus   . BPH (benign prostatic hyperplasia)   . Coronary artery disease    annual stress with Dr. Humphrey Rolls. no new findings  . Coronary artery disease   . Dementia ([place])   . Depression   . Diabetes mellitus without complication ([place])  no medications currently was on insulin but was dropping blood sugars too low causing syncope  . Diverticulosis   . Duodenitis   . Gastritis   . GERD (gastroesophageal reflux disease)   . History of COVID-19 02/2019  . History of kidney stones   . Hypercholesteremia   . Hypertension   . Left renal mass   . Leg fracture, right    wears brace  . Neuromuscular disorder (HCC)    neuropathy - feet  . Occasional tremors     hands  . Prostate cancer ([place])   . Renal insufficiency   . Sleep apnea    doesn't wear CPAP, pt denies  . Wears dentures    full upper    SOCIAL HX:  Social History   Tobacco Use  . Smoking status: Former Smoker    Packs/day: 1.00    Years: 20.00    Pack years: 20.00    Types: Cigarettes, Pipe, Cigars    Quit date: 03/21/1973    Years since quitting: 47.0  . Smokeless tobacco: Current User    Types: Snuff  Substance Use Topics  . Alcohol use: Yes    Alcohol/week: 25.0 standard drinks    Types: 25 Cans of beer per week    Comment: 4-6 a day    ALLERGIES:  Allergies  Allergen Reactions  . Macrolides And Ketolides Swelling    "-mycin" drugs "-mycin" drugs "-mycin" drugs   . Erythromycin     Other reaction(s): Unknown  . Propranolol   . Sulfa Antibiotics Swelling and Other (See Comments)    Other reaction(s): Other (See Comments)  . Sulfa Antibiotics Swelling  . Sulfasalazine Swelling  . Tape Other (See Comments)    Irritates the skin - raw  . Tapentadol Other (See Comments)    Heavy tape causes raw skin  . Tapentadol   . Telbivudine Swelling    "mycin" drugs   . Tape Other (See Comments) and Rash    Heavy tape causes raw skin     PERTINENT MEDICATIONS:  Outpatient Encounter Medications as of 04/14/2020  Medication Sig  . ACCU-CHEK AVIVA PLUS test strip USE 1 STRIP TO CHECK GLUCOSE 4 TIMES DAILY  . acetaminophen (TYLENOL) 325 MG tablet Take 2 tablets (650 mg total) by mouth every 6 (six) hours as needed for mild pain.  [place] Kitchen amLODipine (NORVASC) 10 MG tablet Take 10 mg by mouth daily.  Marland Kitchen aspirin 325 MG tablet Take 325 mg by mouth daily. AM  . benazepril (LOTENSIN) 40 MG tablet Take 40 mg by mouth at bedtime.   . citalopram (CELEXA) 10 MG tablet Take 10 mg by mouth at bedtime.   . clotrimazole (LOTRIMIN) 1 % cream Apply 1 application topically 2 (two) times daily.  . finasteride (PROSCAR) 5 MG tablet TAKE 1 TABLET EVERY DAY (Patient taking differently: Take 5 mg by  mouth at bedtime.)  . gabapentin (NEURONTIN) 300 MG capsule Take 300 mg by mouth 2 (two) times daily.   . heparin 5000 UNIT/ML injection Inject 1 mL (5,000 Units total) into the skin every 8 (eight) hours.  . hydrocortisone (ANUSOL-HC) 2.5 % rectal cream Apply 1 application topically daily as needed.  Marland Kitchen ipratropium-albuterol (DUONEB) 0.5-2.5 (3) MG/3ML SOLN Take 3 mLs by nebulization.  . methocarbamol (ROBAXIN) 500 MG tablet Take 1 tablet (500 mg total) by mouth every 8 (eight) hours as needed for muscle spasms.  . nitroGLYCERIN (NITROSTAT) 0.4 MG SL tablet Place under the tongue.  Marland Kitchen omeprazole (PRILOSEC) 40 MG  capsule Take 40 mg by mouth daily.   . predniSONE (DELTASONE) 10 MG tablet Take one tab 3 x day for 3 days, then take one tab 2 x a day for 3 days and then take one tab a day for 3 days for copd  . Respiratory Therapy Supplies (SOOTHENEB NBL 100 ADULT MASK) MISC Use with breathing treatment ( pt with dementia has problem inhaling with mouth piece )  . rosuvastatin (CRESTOR) 40 MG tablet Take 40 mg by mouth at bedtime.   . tamsulosin (FLOMAX) 0.4 MG CAPS capsule TAKE 1 CAPSULE (0.4 MG TOTAL) BY MOUTH DAILY AFTER BREAKFAST. (Patient taking differently: Take 0.4 mg by mouth daily.)   No facility-administered encounter medications on file as of 04/14/2020.     Odena Mcquaid Jenetta Downer, NP

## 2020-06-02 ENCOUNTER — Other Ambulatory Visit: Payer: Self-pay | Admitting: Interventional Radiology

## 2020-06-02 DIAGNOSIS — N2889 Other specified disorders of kidney and ureter: Secondary | ICD-10-CM

## 2020-06-19 ENCOUNTER — Other Ambulatory Visit: Payer: Self-pay

## 2020-06-19 ENCOUNTER — Ambulatory Visit
Admission: RE | Admit: 2020-06-19 | Discharge: 2020-06-19 | Disposition: A | Discharge status: Home / Self Care | Payer: Medicare HMO | Source: Ambulatory Visit | Attending: Interventional Radiology | Admitting: Interventional Radiology

## 2020-06-19 DIAGNOSIS — N2889 Other specified disorders of kidney and ureter: Secondary | ICD-10-CM

## 2020-06-19 LAB — POCT I-STAT CREATININE: Creatinine, Ser: 1 mg/dL (ref 0.61–1.24)

## 2020-06-19 MED ORDER — IOHEXOL 300 MG/ML  SOLN
100.0000 mL | Freq: Once | INTRAMUSCULAR | Status: AC | PRN
  Administered 2020-06-19: 100 mL via INTRAVENOUS

## 2020-06-25 ENCOUNTER — Other Ambulatory Visit: Payer: Self-pay

## 2020-06-25 ENCOUNTER — Encounter: Payer: Self-pay | Admitting: *Deleted

## 2020-06-25 ENCOUNTER — Ambulatory Visit
Admission: RE | Admit: 2020-06-25 | Discharge: 2020-06-25 | Disposition: A | Discharge status: Home / Self Care | Payer: PRIVATE HEALTH INSURANCE | Source: Ambulatory Visit | Attending: Interventional Radiology | Admitting: Interventional Radiology

## 2020-06-25 DIAGNOSIS — N2889 Other specified disorders of kidney and ureter: Secondary | ICD-10-CM

## 2020-06-25 HISTORY — PX: IR RADIOLOGIST EVAL & MGMT: IMG5224

## 2020-06-25 NOTE — Progress Notes (Signed)
Patient ID: Todd Davidson, adult   DOB: Oct 09, 1935, 85 y.o.   MRN: [id number]       Chief Complaint:  Left renal cell carcinoma, status post ablation  Referring Physician(s): Dr. Erlene Quan  History of Present Illness: Todd Davidson is a 85 y.o. adult with a remote history of prostate cancer, status post radiation.  He is now 1 year status post biopsy and cryoablation of a left upper pole renal mass.  Biopsy confirmed clear cell carcinoma.  Ablation performed at [facility] 06/05/2019.  He is at his baseline.  Stable functional status.  His brother relays that his dementia has progressed.  No significant urinary tract symptoms, hematuria or dysuria.  No recent illness or fever.  Surveillance imaging performed at [facility] 06/19/2020.  This demonstrates some interval regression in overall size of the left kidney ablation defect.  Along the medial deep margin there is a 5 mm enhancing round focus, indeterminate for residual renal tissue or possibly local recurrence.  Otherwise stable renal cysts.  No other acute finding.  Past Medical History:  Diagnosis Date  . Arthritis    neck, shoulders, hips, knees  . Asthma    as child, out grew  . Barrett esophagus   . BPH (benign prostatic hyperplasia)   . Coronary artery disease    annual stress with Dr. Humphrey Rolls. no new findings  . Coronary artery disease   . Dementia ([place])   . Depression   . Diabetes mellitus without complication (HCC)    no medications currently was on insulin but was dropping blood sugars too low causing syncope  . Diverticulosis   . Duodenitis   . Gastritis   . GERD (gastroesophageal reflux disease)   . History of COVID-19 02/2019  . History of kidney stones   . Hypercholesteremia   . Hypertension   . Left renal mass   . Leg fracture, right    wears brace  . Neuromuscular disorder (HCC)    neuropathy - feet  . Occasional tremors    hands  . Prostate cancer ([place])   . Renal  insufficiency   . Sleep apnea    doesn't wear CPAP, pt denies  . Wears dentures    full upper    Past Surgical History:  Procedure Laterality Date  . APPENDECTOMY    . CARDIAC CATHETERIZATION  1999, 2000   stents placed  . CATARACT EXTRACTION W/PHACO Left 10/29/2014   Procedure: CATARACT EXTRACTION PHACO AND INTRAOCULAR LENS PLACEMENT (IOC);  Surgeon: Leandrew Koyanagi, MD;  Location: [facility];  Service: Ophthalmology;  Laterality: Left;  DIABETIC - insulin MALYUGIN  . CERVICAL FUSION    . CORONARY ANGIOPLASTY    . ESOPHAGOGASTRODUODENOSCOPY N/A 12/29/2017   Procedure: ESOPHAGOGASTRODUODENOSCOPY (EGD);  Surgeon: [name], MD;  Location: [facility] ENDOSCOPY;  Service: Endoscopy;  Laterality: N/A;  . ESOPHAGOGASTRODUODENOSCOPY (EGD) WITH PROPOFOL N/A 12/17/2015   Procedure: ESOPHAGOGASTRODUODENOSCOPY (EGD) WITH PROPOFOL;  Surgeon: [name], MD;  Location: [facility] ENDOSCOPY;  Service: Endoscopy;  Laterality: N/A;  . EYE SURGERY Right   . HERNIA REPAIR     x2  . I & D EXTREMITY Right 10/31/2018   Procedure: IRRIGATION AND DEBRIDEMENT RIGHT KNEE  EXTERNAL FIXATION OF FRACTURE;  Surgeon: Shona Needles, MD;  Location: [facility];  Service: Orthopedics;  Laterality: Right;  IRRIGATION AND DEBRIDEMENT RIGHT KNEE  EXTERNAL FIXATION OF FRACTURE  . IR RADIOLOGIST EVAL & MGMT  02/28/2019  . IR RADIOLOGIST EVAL & MGMT  04/30/2019  . IR RADIOLOGIST EVAL & MGMT  07/02/2019  . IR RADIOLOGIST EVAL & MGMT  10/10/2019  . IRRIGATION AND DEBRIDEMENT KNEE Right 10/31/2018  . JOINT REPLACEMENT     knee  . [place]   . KNEE SURGERY Right    joint removal with fusion and graft  . NISSEN FUNDOPLICATION    . RADIOFREQUENCY ABLATION Left 06/05/2019   Procedure: LEFT RENAL  CRYO ABLATION;  Surgeon: Greggory Keen, MD;  Location: [facility];  Service: Anesthesiology;  Laterality: Left;    Allergies: Macrolides and ketolides, Erythromycin, Propranolol, Sulfa antibiotics, Sulfa  antibiotics, Sulfasalazine, Tape, Tapentadol, Tapentadol, Telbivudine, and Tape  Medications: Prior to Admission medications   Medication Sig Start Date End Date Taking? Authorizing Provider  ACCU-CHEK AVIVA PLUS test strip USE 1 STRIP TO CHECK GLUCOSE 4 TIMES DAILY 08/13/17   [provider]  acetaminophen (TYLENOL) 325 MG tablet Take 2 tablets (650 mg total) by mouth every 6 (six) hours as needed for mild pain. 11/05/18   Norm Parcel, PA-C  amLODipine (NORVASC) 10 MG tablet Take 10 mg by mouth daily. 09/04/18   [provider]  aspirin 325 MG tablet Take 325 mg by mouth daily. AM    [provider]  benazepril (LOTENSIN) 40 MG tablet Take 40 mg by mouth at bedtime.  09/04/18   [provider]  citalopram (CELEXA) 10 MG tablet Take 10 mg by mouth at bedtime.  10/30/18   [provider]  clotrimazole (LOTRIMIN) 1 % cream Apply 1 application topically 2 (two) times daily. 08/01/19   Arta Silence, MD  finasteride (PROSCAR) 5 MG tablet TAKE 1 TABLET EVERY DAY Patient taking differently: Take 5 mg by mouth at bedtime. 04/05/19   Stoioff, Ronda Fairly, MD  gabapentin (NEURONTIN) 300 MG capsule Take 300 mg by mouth 2 (two) times daily.  09/27/18   [provider]  heparin 5000 UNIT/ML injection Inject 1 mL (5,000 Units total) into the skin every 8 (eight) hours. 11/05/18   Norm Parcel, PA-C  hydrocortisone (ANUSOL-HC) 2.5 % rectal cream Apply 1 application topically daily as needed. 01/31/20   [provider]  ipratropium-albuterol (DUONEB) 0.5-2.5 (3) MG/3ML SOLN Take 3 mLs by nebulization.    [provider]  methocarbamol (ROBAXIN) 500 MG tablet Take 1 tablet (500 mg total) by mouth every 8 (eight) hours as needed for muscle spasms. 11/05/18   Norm Parcel, PA-C  nitroGLYCERIN (NITROSTAT) 0.4 MG SL tablet Place under the tongue. 06/05/12   [provider]  omeprazole (PRILOSEC) 40 MG capsule Take 40 mg by mouth  daily.     [provider]  predniSONE (DELTASONE) 10 MG tablet Take one tab 3 x day for 3 days, then take one tab 2 x a day for 3 days and then take one tab a day for 3 days for copd 12/04/19   Lavera Guise, MD  Respiratory Therapy Supplies ([place] NBL 100 ADULT MASK) [place] Use with breathing treatment ( pt with dementia has problem inhaling with mouth piece ) 12/04/19   Lavera Guise, MD  rosuvastatin (CRESTOR) 40 MG tablet Take 40 mg by mouth at bedtime.  09/18/18   [provider]  tamsulosin (FLOMAX) 0.4 MG CAPS capsule TAKE 1 CAPSULE (0.4 MG TOTAL) BY MOUTH DAILY AFTER BREAKFAST. Patient taking differently: Take 0.4 mg by mouth daily. 04/05/19   Abbie Sons, MD     Family History  Problem Relation  Age of Onset  . Prostate cancer Father   . Heart disease Brother   . Stroke Mother   . Breast cancer Sister     Social History   Socioeconomic History  . Marital status: Widowed    Spouse name: Not on file  . Number of children: Not on file  . Years of education: Not on file  . Highest education level: Not on file  Occupational History  . Not on file  Tobacco Use  . Smoking status: Former Smoker    Packs/day: 1.00    Years: 20.00    Pack years: 20.00    Types: Cigarettes, Pipe, Cigars    Quit date: 03/21/1973    Years since quitting: 47.2  . Smokeless tobacco: Current User    Types: Snuff  Vaping Use  . Vaping Use: Never used  Substance and Sexual Activity  . Alcohol use: Yes    Alcohol/week: 25.0 standard drinks    Types: 25 Cans of beer per week    Comment: 4-6 a day  . Drug use: Never  . Sexual activity: Not Currently  Other Topics Concern  . Not on file  Social History Narrative   ** Merged History Encounter **       Social Determinants of Health   Financial Resource Strain: Not on file  Food Insecurity: Not on file  Transportation Needs: Not on file  Physical Activity: Not on file  Stress: Not on file  Social Connections: Not on file    Review of Systems  Review of Systems: A 12 point ROS discussed and pertinent positives are indicated in the HPI above.  All other systems are negative.  Physical Exam No direct physical exam was performed, telephone health visit only today because of pandemic Vital Signs: There were no vitals taken for this visit.  Imaging: CT ABDOMEN W WO CONTRAST  Result Date: 06/19/2020 CLINICAL DATA:  Follow-up LEFT renal mass post cryoablation. EXAM: CT ABDOMEN WITHOUT AND WITH CONTRAST TECHNIQUE: Multidetector CT imaging of the abdomen was performed following the standard protocol before and following the bolus administration of intravenous contrast. CONTRAST:  131mL OMNIPAQUE IOHEXOL 300 MG/ML  SOLN COMPARISON:  October 08, 2019 FINDINGS: Lower chest: Lung bases are clear. Signs of pulmonary emphysema as before. Hepatobiliary: Liver without focal, suspicious hepatic lesion. No biliary duct dilation. No pericholecystic stranding. Portal vein is patent. Pancreas: Normal, without mass, inflammation or ductal dilatation. Spleen: Normal Adrenals/Urinary Tract: Adrenal glands are normal. LEFT upper pole renal cryoablation changes. Nodular enhancing area removed from renal parenchyma along the deep margin. This was not present on previous imaging though it does follow the adjacent but not contiguous renal cortex on arterial phase and to a lesser extent on the venous phase. Ablated area at in total measuring 2.2 by approximately 1.8 cm axial dimension as compared to 3.0 x 2.5 cm on the prior study. Small hemorrhagic cyst arises from the lower pole of the LEFT kidney. Renal cortical scarring. Small cysts seen throughout the RIGHT and LEFT kidney elsewhere with a larger cyst arising from the anterior hilar lip of the RIGHT kidney that measures approximately 4.7 x 3.7 cm. Nephrolithiasis without hydronephrosis or significant perinephric stranding. Findings are similar to the prior exam Stomach/Bowel: Small hiatal hernia.  Stomach under distended. No acute gastrointestinal process to the extent evaluated on abdominal CT. Vascular/Lymphatic: Vascular structures in the abdomen are patent with signs of calcified and noncalcified atheromatous plaque throughout the abdominal aorta. No aneurysmal dilation. There is no  gastrohepatic or hepatoduodenal ligament lymphadenopathy. No retroperitoneal or mesenteric lymphadenopathy. Other: No ascites Musculoskeletal: No acute musculoskeletal process. Spinal degenerative changes. Compression fractures at T12 and L3 with similar appearance. IMPRESSION: 1. LEFT upper pole renal cryoablation changes. Nodular enhancing area removed from renal parenchyma along the deep margin. This was not present on previous imaging though it does follow the adjacent but not contiguous renal cortex on arterial phase and to a lesser extent on the venous phase. Findings given nodular appearance are concerning for recurrence at the margin of the ablation site. Recovery of some perfusion of renal cortex in this area is also considered. Suggest short interval follow-up, 3 month follow-up to may help to differentiate. 2. Bilateral renal cysts with a larger cyst arising from the anterior hilar lip of the RIGHT kidney that measures approximately 4.7 x 3.7 cm. Some of these show hemorrhagic or proteinaceous features. 3. Nephrolithiasis without hydronephrosis or significant perinephric stranding. 4. Signs of pulmonary emphysema as before. 5. Small hiatal hernia. 6. Compression fractures at T12 and L3 with similar appearance. 7. Emphysema and aortic atherosclerosis. Aortic Atherosclerosis (ICD10-I70.0) and Emphysema (ICD10-J43.9). Electronically Signed   By: Zetta Bills M.D.   On: 06/19/2020 14:45    Labs:  CBC: Recent Labs    08/01/19 1506  WBC 6.7  HGB 12.6*  HCT 36.7*  PLT 150    COAGS: No results for input(s): INR, APTT in the last 8760 hours.  BMP: Recent Labs    08/01/19 1506 10/08/19 0823  06/19/20 1022  NA 132*  --   --   K 4.0  --   --   CL 100  --   --   CO2 21*  --   --   GLUCOSE 177*  --   --   BUN 18  --   --   CALCIUM 9.0  --   --   CREATININE 0.88 1.20 1.00  GFRNONAA >60  --   --   GFRAA >60  --   --     LIVER FUNCTION TESTS: No results for input(s): BILITOT, AST, ALT, ALKPHOS, PROT, ALBUMIN in the last 8760 hours.   Assessment and Plan:  1 year status post left renal cell carcinoma biopsy and cryoablation.  Overall ablation defect has regressed in size.  Medial deep margin along the renal cortex demonstrates a new 5 mm enhancing small focus, indeterminate for residual renal tissue, reperfusion, or possible marginal recurrence.  Plan: Reassess with repeat CT without and with contrast in 3 months.  This [facility] be [facility] at [facility].   Electronically Signed: Greggory Keen 06/25/2020, 3:51 PM   I spent a total of    25 Minutes in remote  clinical consultation, greater than 50% of which was counseling/coordinating care for this patient with left renal cell carcinoma status post ablation.    Visit type: Audio only (telephone). Audio (no video) only due to patient's lack of internet/smartphone capability. Alternative for in-person consultation at [facility] [address] [place], [place], [place]. This visit type was conducted due to national recommendations for restrictions regarding the COVID-19 Pandemic (e.g. social distancing).  This format is felt to be most appropriate for this patient at this time.  All issues noted in this document were discussed and addressed.

## 2020-07-28 ENCOUNTER — Other Ambulatory Visit: Payer: Self-pay

## 2020-07-28 ENCOUNTER — Encounter: Payer: Self-pay | Admitting: Internal Medicine

## 2020-07-28 ENCOUNTER — Ambulatory Visit (INDEPENDENT_AMBULATORY_CARE_PROVIDER_SITE_OTHER): Payer: Medicare HMO | Admitting: Internal Medicine

## 2020-07-28 VITALS — BP 144/77 | HR 65 | Temp 98.2°F | Resp 16 | Ht 71.0 in | Wt 157.0 lb

## 2020-07-28 DIAGNOSIS — R0602 Shortness of breath: Secondary | ICD-10-CM | POA: Diagnosis not present

## 2020-07-28 DIAGNOSIS — J440 Chronic obstructive pulmonary disease with acute lower respiratory infection: Secondary | ICD-10-CM

## 2020-07-28 DIAGNOSIS — J209 Acute bronchitis, unspecified: Secondary | ICD-10-CM | POA: Diagnosis not present

## 2020-07-28 MED ORDER — AZITHROMYCIN 250 MG PO TABS: ORAL_TABLET | ORAL | 0 refills | Status: DC

## 2020-07-28 MED ORDER — TRELEGY ELLIPTA 100-62.5-25 MCG/INH IN AEPB: 1.0000 | INHALATION_SPRAY | Freq: Every day | RESPIRATORY_TRACT | 11 refills | Status: DC

## 2020-07-28 MED ORDER — PREDNISONE 10 MG PO TABS: ORAL_TABLET | ORAL | 0 refills | Status: DC

## 2020-07-28 NOTE — Progress Notes (Signed)
[facility] [place], [place] [address]  Internal MEDICINE  Office Visit Note  Patient Name: Todd Davidson  [id number]  [id number]  Date of Service: 08/05/2020  Chief Complaint  Patient presents with  . Follow-up  . Asthma  . Cancer  . Depression  . Hypertension  . Hyperlipidemia  . Gastroesophageal Reflux    HPI Todd Davidson is here for pulmonary follow-up for end-stage lung disease His caregiver is very concerned about his breathing patient is only using nebulizer he does not have any other inhalers according to the caregiver his memory is also getting worse patient is followed by by PCP on a regular basis He has not had any recent hospitalization patient is up-to-date on his COVID vaccinations Pulmonary function test from 2021 showed FEV1 reduced to 65% Patient generally go to the bathroom due to his shortness of breath Current Medication: Outpatient Encounter Medications as of 07/28/2020  Medication Sig Note  . ACCU-CHEK AVIVA PLUS test strip USE 1 STRIP TO CHECK GLUCOSE 4 TIMES DAILY   . acetaminophen (TYLENOL) 325 MG tablet Take 2 tablets (650 mg total) by mouth every 6 (six) hours as needed for mild pain.   Marland Kitchen amLODipine (NORVASC) 10 MG tablet Take 10 mg by mouth daily.   Marland Kitchen aspirin 325 MG tablet Take 325 mg by mouth daily. AM   . azithromycin (ZITHROMAX) 250 MG tablet Take one tab a day for 10 days for uri   . benazepril (LOTENSIN) 40 MG tablet Take 40 mg by mouth at bedtime.    . citalopram (CELEXA) 10 MG tablet Take 10 mg by mouth at bedtime.    . clotrimazole (LOTRIMIN) 1 % cream Apply 1 application topically 2 (two) times daily.   . finasteride (PROSCAR) 5 MG tablet TAKE 1 TABLET EVERY DAY (Patient taking differently: Take 5 mg by mouth at bedtime.)   . Fluticasone-Umeclidin-Vilant (TRELEGY ELLIPTA) 100-62.5-25 MCG/INH AEPB Inhale 1 puff into the lungs daily.   Marland Kitchen gabapentin (NEURONTIN) 300 MG capsule Take 300 mg by mouth 2 (two) times daily.     . heparin 5000 UNIT/ML injection Inject 1 mL (5,000 Units total) into the skin every 8 (eight) hours.   . hydrocortisone (ANUSOL-HC) 2.5 % rectal cream Apply 1 application topically daily as needed.   Marland Kitchen ipratropium-albuterol (DUONEB) 0.5-2.5 (3) MG/3ML SOLN Take 3 mLs by nebulization.   . methocarbamol (ROBAXIN) 500 MG tablet Take 1 tablet (500 mg total) by mouth every 8 (eight) hours as needed for muscle spasms.   . nitroGLYCERIN (NITROSTAT) 0.4 MG SL tablet Place under the tongue. 06/05/2019: Doesn't have medication at home.  Needs refill  . omeprazole (PRILOSEC) 40 MG capsule Take 40 mg by mouth daily.    . predniSONE (DELTASONE) 10 MG tablet Take one tab 3 x day for 3 days, then take one tab 2 x a day for 3 days and then take one tab a day for 3 days for copd   . Respiratory Therapy Supplies (SOOTHENEB NBL 100 ADULT MASK) MISC Use with breathing treatment ( pt with dementia has problem inhaling with mouth piece )   . rosuvastatin (CRESTOR) 40 MG tablet Take 40 mg by mouth at bedtime.    . tamsulosin (FLOMAX) 0.4 MG CAPS capsule TAKE 1 CAPSULE (0.4 MG TOTAL) BY MOUTH DAILY AFTER BREAKFAST. (Patient taking differently: Take 0.4 mg by mouth daily.)   . [DISCONTINUED] predniSONE (DELTASONE) 10 MG tablet Take one tab 3 x day for 3 days, then take  one tab 2 x a day for 3 days and then take one tab a day for 3 days for copd    No facility-administered encounter medications on file as of 07/28/2020.    Surgical History: Past Surgical History:  Procedure Laterality Date  . APPENDECTOMY    . CARDIAC CATHETERIZATION  1999, 2000   stents placed  . CATARACT EXTRACTION W/PHACO Left 10/29/2014   Procedure: CATARACT EXTRACTION PHACO AND INTRAOCULAR LENS PLACEMENT (IOC);  Surgeon: Leandrew Koyanagi, MD;  Location: [facility];  Service: Ophthalmology;  Laterality: Left;  DIABETIC - insulin MALYUGIN  . CERVICAL FUSION    . CORONARY ANGIOPLASTY    . ESOPHAGOGASTRODUODENOSCOPY N/A 12/29/2017    Procedure: ESOPHAGOGASTRODUODENOSCOPY (EGD);  Surgeon: [name], MD;  [facility] ENDOSCOPY;  Service: Endoscopy;  Laterality: N/A;  . ESOPHAGOGASTRODUODENOSCOPY (EGD) WITH PROPOFOL N/A 12/17/2015   Procedure: ESOPHAGOGASTRODUODENOSCOPY (EGD) WITH PROPOFOL;  Surgeon: [name], MD;  Location: [facility] ENDOSCOPY;  Service: Endoscopy;  Laterality: N/A;  . EYE SURGERY Right   . HERNIA REPAIR     x2  . I & D EXTREMITY Right 10/31/2018   Procedure: IRRIGATION AND DEBRIDEMENT RIGHT KNEE  EXTERNAL FIXATION OF FRACTURE;  Surgeon: Shona Needles, MD;  Location: [facility];  Service: Orthopedics;  Laterality: Right;  IRRIGATION AND DEBRIDEMENT RIGHT KNEE  EXTERNAL FIXATION OF FRACTURE  . IR RADIOLOGIST EVAL & MGMT  02/28/2019  . IR RADIOLOGIST EVAL & MGMT  04/30/2019  . IR RADIOLOGIST EVAL & MGMT  07/02/2019  . IR RADIOLOGIST EVAL & MGMT  10/10/2019  . IR RADIOLOGIST EVAL & MGMT  06/25/2020  . IRRIGATION AND DEBRIDEMENT KNEE Right 10/31/2018  . JOINT REPLACEMENT     knee  . [place]   . KNEE SURGERY Right    joint removal with fusion and graft  . NISSEN FUNDOPLICATION    . RADIOFREQUENCY ABLATION Left 06/05/2019   Procedure: LEFT RENAL  CRYO ABLATION;  Surgeon: Greggory Keen, MD;  Location: [facility];  Service: Anesthesiology;  Laterality: Left;    Medical History: Past Medical History:  Diagnosis Date  . Arthritis    neck, shoulders, hips, knees  . Asthma    as child, out grew  . Barrett esophagus   . BPH (benign prostatic hyperplasia)   . Coronary artery disease    annual stress with Dr. Humphrey Rolls. no new findings  . Coronary artery disease   . Dementia ([place])   . Depression   . Diabetes mellitus without complication (HCC)    no medications currently was on insulin but was dropping blood sugars too low causing syncope  . Diverticulosis   . Duodenitis   . Gastritis   . GERD (gastroesophageal reflux disease)   . History of COVID-19 02/2019  . History of kidney stones   .  Hypercholesteremia   . Hypertension   . Left renal mass   . Leg fracture, right    wears brace  . Neuromuscular disorder (HCC)    neuropathy - feet  . Occasional tremors    hands  . Prostate cancer ([place])   . Renal insufficiency   . Sleep apnea    doesn't wear CPAP, pt denies  . Wears dentures    full upper    Family History: Family History  Problem Relation Age of Onset  . Prostate cancer Father   . Heart disease Brother   . Stroke Mother   . Breast cancer Sister  Social History   Socioeconomic History  . Marital status: Widowed    Spouse name: Not on file  . Number of children: Not on file  . Years of education: Not on file  . Highest education level: Not on file  Occupational History  . Not on file  Tobacco Use  . Smoking status: Former Smoker    Packs/day: 1.00    Years: 20.00    Pack years: 20.00    Types: Cigarettes, Pipe, Cigars    Quit date: 03/21/1973    Years since quitting: 47.4  . Smokeless tobacco: Current User    Types: Snuff  . Tobacco comment: SMOKELESS TOBACCO  Vaping Use  . Vaping Use: Never used  Substance and Sexual Activity  . Alcohol use: Yes    Alcohol/week: 25.0 standard drinks    Types: 25 Cans of beer per week    Comment: 4-6 a day  . Drug use: Never  . Sexual activity: Not Currently  Other Topics Concern  . Not on file  Social History Narrative   ** Merged History Encounter **       Social Determinants of Health   Financial Resource Strain: Not on file  Food Insecurity: Not on file  Transportation Needs: Not on file  Physical Activity: Not on file  Stress: Not on file  Social Connections: Not on file  Intimate Partner Violence: Not on file      Review of Systems  Constitutional: Negative for chills, diaphoresis and fatigue.  HENT: Negative for ear pain, postnasal drip and sinus pressure.   Eyes: Negative for photophobia, discharge, redness, itching and visual disturbance.  Respiratory: Positive for cough and  shortness of breath. Negative for wheezing.   Cardiovascular: Negative for chest pain, palpitations and leg swelling.  Gastrointestinal: Negative for abdominal pain, constipation, diarrhea, nausea and vomiting.  Genitourinary: Negative for dysuria and flank pain.  Musculoskeletal: Negative for arthralgias, back pain, gait problem and neck pain.  Skin: Negative for color change.  Allergic/Immunologic: Negative for environmental allergies and food allergies.  Neurological: Negative for dizziness and headaches.  Hematological: Does not bruise/bleed easily.  Psychiatric/Behavioral: Negative for agitation, behavioral problems (depression) and hallucinations.    Vital Signs: BP (!) 144/77   Pulse 65   Temp 98.2 F (36.8 C)   Resp 16   Ht 5\' 11"  (1.803 m)   Wt 157 lb (71.2 kg)   SpO2 96%   BMI 21.90 kg/m    Physical Exam Constitutional:      Appearance: Normal appearance.  HENT:     Head: Normocephalic and atraumatic.     Nose: Nose normal.     Mouth/Throat:     Mouth: Mucous membranes are moist.     Pharynx: No posterior oropharyngeal erythema.  Eyes:     Extraocular Movements: Extraocular movements intact.     Pupils: Pupils are equal, round, and reactive to light.  Cardiovascular:     Rate and Rhythm: Tachycardia present.     Pulses: Normal pulses.     Heart sounds: Normal heart sounds.  Pulmonary:     Comments: Patient has decreased breath sounds bilaterally and is also tachypneic there is chest congestion  Neurological:     General: No focal deficit present.     Mental Status: He is alert.  Psychiatric:        Mood and Affect: Mood normal.        Behavior: Behavior normal.        Assessment/Plan: 1. Acute bronchitis  with COPD ([place]) Upon review of his medication patient is only using nebulizer treatment will maximize his therapy for COPD add Trelegy once a day continue nebulizer treatment every 4 to 6 hours.  We will add a chest x-ray. Patient is too frail to  walk and unable to assess his degree of hypoxemia will order arterial blood gas in the hospital - Fluticasone-Umeclidin-Vilant (TRELEGY ELLIPTA) 100-62.5-25 MCG/INH AEPB; Inhale 1 puff into the lungs daily.  Dispense: 1 each; Refill: 11 - azithromycin (ZITHROMAX) 250 MG tablet; Take one tab a day for 10 days for uri  Dispense: 10 tablet; Refill: 0 - predniSONE (DELTASONE) 10 MG tablet; Take one tab 3 x day for 3 days, then take one tab 2 x a day for 3 days and then take one tab a day for 3 days for copd  Dispense: 18 tablet; Refill: 0  2. SOB (shortness of breath) Patient is acutely short of breath the caregiver is instructed to go to ED if patient condition get worse.  Spine was also reviewed with the caregiver - Spirometry with Graph - DG Chest 2 View; Future - 6 minute walk; Future  General Counseling: dayvon dax understanding of the findings of todays visit and agrees with plan of treatment. I have discussed any further diagnostic evaluation that may be needed or ordered today. We also reviewed his medications today. he has been encouraged to call the office with any questions or concerns that should arise related to todays visit.    Orders Placed This Encounter  Procedures  . DG Chest 2 View  . Spirometry with Graph  . 6 minute walk    Meds ordered this encounter  Medications  . Fluticasone-Umeclidin-Vilant (TRELEGY ELLIPTA) 100-62.5-25 MCG/INH AEPB    Sig: Inhale 1 puff into the lungs daily.    Dispense:  1 each    Refill:  11  . azithromycin (ZITHROMAX) 250 MG tablet    Sig: Take one tab a day for 10 days for uri    Dispense:  10 tablet    Refill:  0  . predniSONE (DELTASONE) 10 MG tablet    Sig: Take one tab 3 x day for 3 days, then take one tab 2 x a day for 3 days and then take one tab a day for 3 days for copd    Dispense:  18 tablet    Refill:  0    Total time spent:30 Minutes Time spent includes review of chart, medications, test results, and follow up plan  with the patient.   [place] Controlled Substance Database was reviewed by me.   Dr Lavera Guise Internal medicine

## 2020-08-03 ENCOUNTER — Other Ambulatory Visit: Payer: Self-pay

## 2020-08-03 ENCOUNTER — Ambulatory Visit
Admission: RE | Admit: 2020-08-03 | Discharge: 2020-08-03 | Disposition: A | Discharge status: Home / Self Care | Payer: Medicare HMO | Source: Ambulatory Visit | Attending: Internal Medicine | Admitting: Internal Medicine

## 2020-08-03 ENCOUNTER — Ambulatory Visit: Payer: Medicare HMO

## 2020-08-03 ENCOUNTER — Other Ambulatory Visit: Payer: Self-pay | Admitting: Internal Medicine

## 2020-08-03 DIAGNOSIS — R0602 Shortness of breath: Secondary | ICD-10-CM | POA: Insufficient documentation

## 2020-08-03 LAB — BLOOD GAS, ARTERIAL
Acid-Base Excess: 3 mmol/L — ABNORMAL HIGH (ref 0.0–2.0)
Bicarbonate: 27.1 mmol/L (ref 20.0–28.0)
FIO2: 0.21
O2 Saturation: 97.2 %
Patient temperature: 37
pCO2 arterial: 39 mmHg (ref 32.0–48.0)
pH, Arterial: 7.45 (ref 7.350–7.450)
pO2, Arterial: 88 mmHg (ref 83.0–108.0)

## 2020-08-18 ENCOUNTER — Ambulatory Visit (INDEPENDENT_AMBULATORY_CARE_PROVIDER_SITE_OTHER): Payer: Medicare HMO | Admitting: Internal Medicine

## 2020-08-18 ENCOUNTER — Encounter: Payer: Self-pay | Admitting: Internal Medicine

## 2020-08-18 ENCOUNTER — Other Ambulatory Visit: Payer: Self-pay

## 2020-08-18 VITALS — BP 134/68 | HR 84 | Temp 98.4°F | Resp 16 | Ht 70.0 in | Wt 158.6 lb

## 2020-08-18 DIAGNOSIS — N183 Chronic kidney disease, stage 3 unspecified: Secondary | ICD-10-CM | POA: Diagnosis not present

## 2020-08-18 DIAGNOSIS — J4520 Mild intermittent asthma, uncomplicated: Secondary | ICD-10-CM | POA: Diagnosis not present

## 2020-08-18 DIAGNOSIS — F0390 Unspecified dementia without behavioral disturbance: Secondary | ICD-10-CM

## 2020-08-18 DIAGNOSIS — I25811 Atherosclerosis of native coronary artery of transplanted heart without angina pectoris: Secondary | ICD-10-CM

## 2020-08-18 NOTE — Patient Instructions (Signed)
Asthma, Adult  Asthma is a long-term (chronic) condition in which the airways get tight and narrow. The airways are the breathing passages that lead from the nose and mouth down into the lungs. A person with asthma will have times when symptoms get worse. These are called asthma attacks. They can cause coughing, whistling sounds when you breathe (wheezing), shortness of breath, and chest pain. They can make it hard to breathe. There is no cure for asthma, but medicines and lifestyle changes can help control it. There are many things that can bring on an asthma attack or make asthma symptoms worse (triggers). Common triggers include:  Mold.  Dust.  Cigarette smoke.  Cockroaches.  Things that can cause allergy symptoms (allergens). These include animal skin flakes (dander) and pollen from trees or grass.  Things that pollute the air. These may include household cleaners, wood smoke, smog, or chemical odors.  Cold air, weather changes, and wind.  Crying or laughing hard.  Stress.  Certain medicines or drugs.  Certain foods such as dried fruit, potato chips, and grape juice.  Infections, such as a cold or the flu.  Certain medical conditions or diseases.  Exercise or tiring activities. Asthma may be treated with medicines and by staying away from the things that cause asthma attacks. Types of medicines may include:  Controller medicines. These help prevent asthma symptoms. They are usually taken every day.  Fast-acting reliever or rescue medicines. These quickly relieve asthma symptoms. They are used as needed and provide short-term relief.  Allergy medicines if your attacks are brought on by allergens.  Medicines to help control the body's defense (immune) system. Follow these instructions at home: Avoiding triggers in your home  Change your heating and air conditioning filter often.  Limit your use of fireplaces and wood stoves.  Get rid of pests (such as roaches and  mice) and their droppings.  Throw away plants if you see mold on them.  Clean your floors. Dust regularly. Use cleaning products that do not smell.  Have someone vacuum when you are not home. Use a vacuum cleaner with a HEPA filter if possible.  Replace carpet with wood, tile, or vinyl flooring. Carpet can trap animal skin flakes and dust.  Use allergy-proof pillows, mattress covers, and box spring covers.  Wash bed sheets and blankets every week in hot water. Dry them in a dryer.  Keep your bedroom free of any triggers.  Avoid pets and keep windows closed when things that cause allergy symptoms are in the air.  Use blankets that are made of polyester or cotton.  Clean bathrooms and kitchens with bleach. If possible, have someone repaint the walls in these rooms with mold-resistant paint. Keep out of the rooms that are being cleaned and painted.  Wash your hands often with soap and water. If soap and water are not available, use hand sanitizer.  Do not allow anyone to smoke in your home. General instructions  Take over-the-counter and prescription medicines only as told by your doctor. ? Talk with your doctor if you have questions about how or when to take your medicines. ? Make note if you need to use your medicines more often than usual.  Do not use any products that contain nicotine or tobacco, such as cigarettes and e-cigarettes. If you need help quitting, ask your doctor.  Stay away from secondhand smoke.  Avoid doing things outdoors when allergen counts are high and when air quality is low.  Wear a ski mask   when doing outdoor activities in the winter. The mask should cover your nose and mouth. Exercise indoors on cold days if you can.  Warm up before you exercise. Take time to cool down after exercise.  Use a peak flow meter as told by your doctor. A peak flow meter is a tool that measures how well the lungs are working.  Keep track of the peak flow meter's readings.  Write them down.  Follow your asthma action plan. This is a written plan for taking care of your asthma and treating your attacks.  Make sure you get all the shots (vaccines) that your doctor recommends. Ask your doctor about a flu shot and a pneumonia shot.  Keep all follow-up visits as told by your doctor. This is important. Contact a doctor if:  You have wheezing, shortness of breath, or a cough even while taking medicine to prevent attacks.  The mucus you cough up (sputum) is thicker than usual.  The mucus you cough up changes from clear or white to yellow, green, gray, or bloody.  You have problems from the medicine you are taking, such as: ? A rash. ? Itching. ? Swelling. ? Trouble breathing.  You need reliever medicines more than 2-3 times a week.  Your peak flow reading is still at 50-79% of your personal best after following the action plan for 1 hour.  You have a fever. Get help right away if:  You seem to be worse and are not responding to medicine during an asthma attack.  You are short of breath even at rest.  You get short of breath when doing very little activity.  You have trouble eating, drinking, or talking.  You have chest pain or tightness.  You have a fast heartbeat.  Your lips or fingernails start to turn blue.  You are light-headed or dizzy, or you faint.  Your peak flow is less than 50% of your personal best.  You feel too tired to breathe normally. Summary  Asthma is a long-term (chronic) condition in which the airways get tight and narrow. An asthma attack can make it hard to breathe.  Asthma cannot be cured, but medicines and lifestyle changes can help control it.  Make sure you understand how to avoid triggers and how and when to use your medicines. This information is not intended to replace advice given to you by your health care provider. Make sure you discuss any questions you have with your health care provider. Document Revised:  07/10/2019 Document Reviewed: 07/10/2019 Elsevier Patient Education  2021 [employer]   
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
## 2020-09-16 ENCOUNTER — Other Ambulatory Visit: Payer: Self-pay | Admitting: Interventional Radiology

## 2020-09-16 DIAGNOSIS — N2889 Other specified disorders of kidney and ureter: Secondary | ICD-10-CM

## 2020-10-05 DIAGNOSIS — I472 Ventricular tachycardia: Secondary | ICD-10-CM | POA: Diagnosis not present

## 2020-10-05 DIAGNOSIS — J40 Bronchitis, not specified as acute or chronic: Secondary | ICD-10-CM | POA: Diagnosis not present

## 2020-10-05 DIAGNOSIS — Z9861 Coronary angioplasty status: Secondary | ICD-10-CM | POA: Diagnosis not present

## 2020-10-05 DIAGNOSIS — I2510 Atherosclerotic heart disease of native coronary artery without angina pectoris: Secondary | ICD-10-CM | POA: Diagnosis not present

## 2020-10-05 DIAGNOSIS — E782 Mixed hyperlipidemia: Secondary | ICD-10-CM | POA: Diagnosis not present

## 2020-10-05 DIAGNOSIS — R0602 Shortness of breath: Secondary | ICD-10-CM | POA: Diagnosis not present

## 2020-10-05 DIAGNOSIS — I10 Essential (primary) hypertension: Secondary | ICD-10-CM | POA: Diagnosis not present

## 2020-10-05 DIAGNOSIS — E1122 Type 2 diabetes mellitus with diabetic chronic kidney disease: Secondary | ICD-10-CM | POA: Diagnosis not present

## 2020-10-05 DIAGNOSIS — G4733 Obstructive sleep apnea (adult) (pediatric): Secondary | ICD-10-CM | POA: Diagnosis not present

## 2020-10-05 DIAGNOSIS — E119 Type 2 diabetes mellitus without complications: Secondary | ICD-10-CM | POA: Diagnosis not present

## 2020-10-05 DIAGNOSIS — F32A Depression, unspecified: Secondary | ICD-10-CM | POA: Diagnosis not present

## 2020-10-05 DIAGNOSIS — E11649 Type 2 diabetes mellitus with hypoglycemia without coma: Secondary | ICD-10-CM | POA: Diagnosis not present

## 2020-10-05 DIAGNOSIS — G252 Other specified forms of tremor: Secondary | ICD-10-CM | POA: Diagnosis not present

## 2020-10-13 ENCOUNTER — Ambulatory Visit
Admission: RE | Admit: 2020-10-13 | Discharge: 2020-10-13 | Disposition: A | Discharge status: Home / Self Care | Payer: Medicare HMO | Source: Ambulatory Visit | Attending: Interventional Radiology | Admitting: Interventional Radiology

## 2020-10-13 ENCOUNTER — Other Ambulatory Visit: Payer: Self-pay

## 2020-10-13 DIAGNOSIS — N2889 Other specified disorders of kidney and ureter: Secondary | ICD-10-CM | POA: Diagnosis not present

## 2020-10-13 DIAGNOSIS — N281 Cyst of kidney, acquired: Secondary | ICD-10-CM | POA: Diagnosis not present

## 2020-10-13 DIAGNOSIS — I700 Atherosclerosis of aorta: Secondary | ICD-10-CM | POA: Diagnosis not present

## 2020-10-13 LAB — POCT I-STAT CREATININE: Creatinine, Ser: 0.8 mg/dL (ref 0.61–1.24)

## 2020-10-13 MED ORDER — IOHEXOL 300 MG/ML  SOLN
100.0000 mL | Freq: Once | INTRAMUSCULAR | Status: AC | PRN
  Administered 2020-10-13: 100 mL via INTRAVENOUS

## 2020-10-14 ENCOUNTER — Encounter: Payer: Self-pay | Admitting: *Deleted

## 2020-10-14 ENCOUNTER — Ambulatory Visit
Admission: RE | Admit: 2020-10-14 | Discharge: 2020-10-14 | Disposition: A | Discharge status: Home / Self Care | Payer: PRIVATE HEALTH INSURANCE | Source: Ambulatory Visit | Attending: Interventional Radiology | Admitting: Interventional Radiology

## 2020-10-14 DIAGNOSIS — N2889 Other specified disorders of kidney and ureter: Secondary | ICD-10-CM

## 2020-10-14 DIAGNOSIS — Z9889 Other specified postprocedural states: Secondary | ICD-10-CM | POA: Diagnosis not present

## 2020-10-14 DIAGNOSIS — C642 Malignant neoplasm of left kidney, except renal pelvis: Secondary | ICD-10-CM | POA: Diagnosis not present

## 2020-10-14 HISTORY — PX: IR RADIOLOGIST EVAL & MGMT: IMG5224

## 2020-10-14 NOTE — Progress Notes (Signed)
Patient ID: Todd Davidson, adult   DOB: Apr 09, 1935, 85 y.o.   MRN: [id number].         Chief Complaint:  Left renal cell carcinoma, surveillance follow-up after recent ablation  Referring Physician(s): Dr. Erlene Quan  History of Present Illness: Todd Davidson is a 85 y.o. adult with a previous history of prostate cancer, status post radiation.  He also has progressive dementia now requiring 24/7 in-home care.  Other comorbidities include COPD and CHF.  He underwent left upper pole renal cell carcinoma cryoablation 06/05/2019 at [facility].  Biopsy confirmed clear-cell renal cell carcinoma.  Today's telehealth visit is with his brother, Todd Davidson.  He reports that his mental status continues to decline.  No significant urinary tract symptoms, hematuria or dysuria.  No recent illness or fevers.  Overall stable limited functional status.  Surveillance imaging performed at [facility] yesterday demonstrates interval enlargement of a medial deep margin enhancing nodule now measuring 9 mm, previously 5 mm.  This is consistent with residual or recurrent disease along the medial ablation margin.  Otherwise stable bilateral renal cyst.  No other acute finding.  Past Medical History:  Diagnosis Date   Arthritis    neck, shoulders, hips, knees   Asthma    as child, out grew   Barrett esophagus    BPH (benign prostatic hyperplasia)    Coronary artery disease    annual stress with Dr. Humphrey Rolls. no new findings   Coronary artery disease    Dementia ([place])    Depression    Diabetes mellitus without complication ([place])    no medications currently was on insulin but was dropping blood sugars too low causing syncope   Diverticulosis    Duodenitis    Gastritis    GERD (gastroesophageal reflux disease)    History of COVID-19 02/2019   History of kidney stones    Hypercholesteremia    Hypertension    Left renal mass    Leg fracture, right    wears brace   Neuromuscular disorder  (HCC)    neuropathy - feet   Occasional tremors    hands   Prostate cancer ([place])    Renal insufficiency    Sleep apnea    doesn't wear CPAP, pt denies   Wears dentures    full upper    Past Surgical History:  Procedure Laterality Date   [place], 2000   stents placed   CATARACT EXTRACTION W/PHACO Left 10/29/2014   Procedure: CATARACT EXTRACTION PHACO AND INTRAOCULAR LENS PLACEMENT ([place]);  Surgeon: Leandrew Koyanagi, MD;  Location: [place];  Service: Ophthalmology;  Laterality: Left;  DIABETIC - insulin MALYUGIN   CERVICAL FUSION     CORONARY ANGIOPLASTY     ESOPHAGOGASTRODUODENOSCOPY N/A 12/29/2017   Procedure: ESOPHAGOGASTRODUODENOSCOPY (EGD);  Surgeon: [name], MD;  Location: [facility] ENDOSCOPY;  Service: Endoscopy;  Laterality: N/A;   ESOPHAGOGASTRODUODENOSCOPY (EGD) WITH PROPOFOL N/A 12/17/2015   Procedure: ESOPHAGOGASTRODUODENOSCOPY (EGD) WITH PROPOFOL;  Surgeon: [name], MD;  Location: [facility] ENDOSCOPY;  Service: Endoscopy;  Laterality: N/A;   EYE SURGERY Right    HERNIA REPAIR     x2   I & D EXTREMITY Right 10/31/2018   Procedure: IRRIGATION AND DEBRIDEMENT RIGHT KNEE  EXTERNAL FIXATION OF FRACTURE;  Surgeon: Shona Needles, MD;  Location: [place];  Service: Orthopedics;  Laterality: Right;  IRRIGATION AND DEBRIDEMENT RIGHT KNEE  EXTERNAL FIXATION OF FRACTURE   IR RADIOLOGIST EVAL &  MGMT  02/28/2019   IR RADIOLOGIST EVAL & MGMT  04/30/2019   IR RADIOLOGIST EVAL & MGMT  07/02/2019   IR RADIOLOGIST EVAL & MGMT  10/10/2019   IR RADIOLOGIST EVAL & MGMT  06/25/2020   IRRIGATION AND DEBRIDEMENT KNEE Right 10/31/2018   JOINT REPLACEMENT     knee   KNEE FUSION     1973    KNEE SURGERY Right    joint removal with fusion and graft   NISSEN FUNDOPLICATION     RADIOFREQUENCY ABLATION Left 06/05/2019   Procedure: LEFT RENAL  CRYO ABLATION;  Surgeon: Greggory Keen, MD;  Location: [facility];  Service: Anesthesiology;   Laterality: Left;    Allergies: Macrolides and ketolides, Erythromycin, Propranolol, Sulfa antibiotics, Sulfa antibiotics, Sulfasalazine, Tape, Tapentadol, Tapentadol, Telbivudine, and Tape  Medications: Prior to Admission medications   Medication Sig Start Date End Date Taking? Authorizing Provider  ACCU-CHEK AVIVA PLUS test strip USE 1 STRIP TO CHECK GLUCOSE 4 TIMES DAILY 08/13/17   [provider]  acetaminophen (TYLENOL) 325 MG tablet Take 2 tablets (650 mg total) by mouth every 6 (six) hours as needed for mild pain. 11/05/18   Norm Parcel, PA-C  amLODipine (NORVASC) 10 MG tablet Take 10 mg by mouth daily. 09/04/18   [provider]  aspirin 325 MG tablet Take 325 mg by mouth daily. AM    [provider]  benazepril (LOTENSIN) 40 MG tablet Take 40 mg by mouth at bedtime.  09/04/18   [provider]  citalopram (CELEXA) 10 MG tablet Take 10 mg by mouth at bedtime.  10/30/18   [provider]  clotrimazole (LOTRIMIN) 1 % cream Apply 1 application topically 2 (two) times daily. 08/01/19   Arta Silence, MD  finasteride (PROSCAR) 5 MG tablet TAKE 1 TABLET EVERY DAY Patient taking differently: Take 5 mg by mouth at bedtime. 04/05/19   Stoioff, Ronda Fairly, MD  Fluticasone-Umeclidin-Vilant (TRELEGY ELLIPTA) 100-62.5-25 MCG/INH AEPB Inhale 1 puff into the lungs daily. 07/28/20   Lavera Guise, MD  gabapentin (NEURONTIN) 300 MG capsule Take 300 mg by mouth 2 (two) times daily.  09/27/18   [provider]  heparin 5000 UNIT/ML injection Inject 1 mL (5,000 Units total) into the skin every 8 (eight) hours. 11/05/18   Norm Parcel, PA-C  hydrocortisone (ANUSOL-HC) 2.5 % rectal cream Apply 1 application topically daily as needed. 01/31/20   [provider]  ipratropium-albuterol (DUONEB) 0.5-2.5 (3) MG/3ML SOLN Take 3 mLs by nebulization.    [provider]  methocarbamol (ROBAXIN) 500 MG tablet Take 1 tablet (500 mg total) by  mouth every 8 (eight) hours as needed for muscle spasms. 11/05/18   Norm Parcel, PA-C  nitroGLYCERIN (NITROSTAT) 0.4 MG SL tablet Place under the tongue. 06/05/12   [provider]  omeprazole (PRILOSEC) 40 MG capsule Take 40 mg by mouth daily.     [provider]  Respiratory Therapy Supplies (SOOTHENEB NBL 100 ADULT MASK) MISC Use with breathing treatment ( pt with dementia has problem inhaling with mouth piece ) 12/04/19   Lavera Guise, MD  rosuvastatin (CRESTOR) 40 MG tablet Take 40 mg by mouth at bedtime.  09/18/18   [provider]  tamsulosin (FLOMAX) 0.4 MG CAPS capsule TAKE 1 CAPSULE (0.4 MG TOTAL) BY MOUTH DAILY AFTER BREAKFAST. Patient taking differently: Take 0.4 mg by mouth daily. 04/05/19   Abbie Sons, MD     Family History  Problem Relation Age of Onset  Prostate cancer Father    Heart disease Brother    Stroke Mother    Breast cancer Sister     Social History   Socioeconomic History   Marital status: Widowed    Spouse name: Not on file   Number of children: Not on file   Years of education: Not on file   Highest education level: Not on file  Occupational History   Not on file  Tobacco Use   Smoking status: Former    Packs/day: 1.00    Years: 20.00    Pack years: 20.00    Types: Cigarettes, Pipe, Cigars    Quit date: 03/21/1973    Years since quitting: 47.6   Smokeless tobacco: Current    Types: Snuff   Tobacco comments:    SMOKELESS TOBACCO  Vaping Use   Vaping Use: Never used  Substance and Sexual Activity   Alcohol use: Yes    Alcohol/week: 25.0 standard drinks    Types: 25 Cans of beer per week    Comment: 4-6 a day   Drug use: Never   Sexual activity: Not Currently  Other Topics Concern   Not on file  Social History Narrative   ** Merged History Encounter **       Social Determinants of Health   Financial Resource Strain: Not on file  Food Insecurity: Not on file  Transportation Needs: Not on file   Physical Activity: Not on file  Stress: Not on file  Social Connections: Not on file     Review of Systems  Review of Systems: A 12 point ROS discussed and pertinent positives are indicated in the HPI above.  All other systems are negative.  Physical Exam No direct physical exam was performed, telephone health visit only today  Vital Signs: There were no vitals taken for this visit.  Imaging: CT ABDOMEN W WO CONTRAST  Result Date: 10/14/2020 CLINICAL DATA:  Left renal mass. Status post biopsy and cryoablation. EXAM: CT ABDOMEN WITHOUT AND WITH CONTRAST TECHNIQUE: Multidetector CT imaging of the abdomen was performed following the standard protocol before and following the bolus administration of intravenous contrast. CONTRAST:  119m OMNIPAQUE IOHEXOL 300 MG/ML  SOLN COMPARISON:  06/19/2020 FINDINGS: Lower chest: No acute abnormality. Hepatobiliary: No focal liver abnormality is seen. No gallstones, gallbladder wall thickening, or biliary dilatation. Pancreas: Unremarkable. No pancreatic ductal dilatation or surrounding inflammatory changes. Spleen: Normal in size without focal abnormality. Adrenals/Urinary Tract: Normal adrenal glands. Upper pole left kidney cryoablation zone is again noted. This measures 2.2 x 2.2 cm, image 34/3. Unchanged from previous exam. However, along the medial margin of the ablation zone is an arterial phase enhancing nodule measuring 8 mm, image 34/3. On the previous exam this measured 5 mm. Measured off the coronal images this measures 0.8 cm, image 63/9. Formally 0.6 cm. This nodule has similar enhancement to the renal cortex on the arterial phase images, but appears relatively hypodense to the renal cortex on the current nephrographic and delayed phase images. Multiple bilateral kidney cysts are again seen the largest arises off the anterior cortex of the interpolar right kidney measuring 4.9 cm, image 47/3. Hyperdense kidney lesion arising off the anterolateral  aspect of the inferior pole of left kidney measures 1.6 cm, image 52/3. No enhancement identified within this lesion compatible with Bosniak class 2 cyst. No hydronephrosis identified.  No perinephric fat stranding. Stomach/Bowel: Stomach is within normal limits. Appendix appears normal. No evidence of bowel wall thickening, distention, or inflammatory changes. Vascular/Lymphatic:  Aortic atherosclerosis. No aneurysm. No enlarged upper abdominal lymph nodes. Patent vasculature within the upper abdomen. Other: No ascites or focal fluid collections. Musculoskeletal: No acute or significant osseous findings. IMPRESSION: 1. Stable appearance of cryo ablation zone within the superior pole of left kidney. There is an arterial phase enhancing nodule along the medial margin of the ablation zone which has increased in size in the interval. Cannot rule out local tumor recurrence. 2. Stable Bosniak class 1 and 2 kidney cysts. 3. Aortic atherosclerosis. Aortic Atherosclerosis (ICD10-I70.0). Electronically Signed   By: Kerby Moors M.D.   On: 10/14/2020 10:21    Labs:  CBC: No results for input(s): WBC, HGB, HCT, PLT in the last 8760 hours.  COAGS: No results for input(s): INR, APTT in the last 8760 hours.  BMP: Recent Labs    06/19/20 1022 10/13/20 0953  CREATININE 1.00 0.80    LIVER FUNCTION TESTS: No results for input(s): BILITOT, AST, ALT, ALKPHOS, PROT, ALBUMIN in the last 8760 hours.   Assessment and Plan:  Approximate 1 year 72-monthstatus post left renal cell carcinoma cryoablation although the ablation defect has regressed in size, the medial deep margin along the renal cortex demonstrates a round enhancing focus now measuring 9 mm, previously 5 mm.  This is consistent with margin recurrence or residual tumor by CT.  Imaging findings were reviewed today with the patient's brother because of the patient's progressive dementia and poor mental status.  He understands that this is consistent with  a ablation margin recurrence or residual tumor based on his original scan and pathology results.  Treatment options would include repeat image guided cryoablation versus close surveillance.  [facility] discussion[facility] and determine if ablation can be performed.  His brother is in agreement with this and will accompany him that day.  Plan[facility]  Electronically Signed: MGreggory Keen7/27/2022, 10:27 AM   I spent a total of    25 Minutes in remote  clinical consultation, greater than 50% of which was counseling/coordinating care for this patient with renal cell carcinoma status post ablation.    Visit type: Audio only (telephone). Audio (no video) only due to patient's lack of internet/smartphone capability. [facility] This visit type was conducted due to national recommendations for restrictions regarding the COVID-19 Pandemic (e.g. social distancing).  This format is felt to be most appropriate for this patient at this time.  All issues noted in this document were discussed and addressed.

## 2020-10-15 ENCOUNTER — Other Ambulatory Visit: Payer: Self-pay | Admitting: Emergency Medicine

## 2020-10-15 ENCOUNTER — Other Ambulatory Visit: Payer: Self-pay | Admitting: Interventional Radiology

## 2020-10-15 DIAGNOSIS — C642 Malignant neoplasm of left kidney, except renal pelvis: Secondary | ICD-10-CM

## 2020-10-23 ENCOUNTER — Ambulatory Visit
Admission: RE | Admit: 2020-10-23 | Discharge: 2020-10-23 | Disposition: A | Discharge status: Home / Self Care | Payer: Medicare Other | Source: Ambulatory Visit | Attending: Interventional Radiology | Admitting: Interventional Radiology

## 2020-10-23 DIAGNOSIS — N2889 Other specified disorders of kidney and ureter: Secondary | ICD-10-CM | POA: Diagnosis not present

## 2020-10-23 DIAGNOSIS — Z9889 Other specified postprocedural states: Secondary | ICD-10-CM | POA: Diagnosis not present

## 2020-10-23 NOTE — Progress Notes (Signed)
Patient ID: Todd Davidson, adult   DOB: [birth year], 85 y.o.   MRN: [id number]      Chief Complaint:  Left renal cell carcinoma status post ablation  Referring Physician(s): Dr. Erlene Quan, [facility] urology   Patient Status: [facility] - Out-pt  History of Present Illness: Todd Davidson is a 85 y.o. adult who is now 1 year 77-monthstatus post left upper pole renal cell carcinoma biopsy [facility]  Biopsy confirmed clear-cell renal cell carcinoma.  He has other comorbidities including previous prostate cancer, progressive dementia, COPD and CHF.  He also has a fused right knee.  He is able to perform his activities of daily living and does use a walker to ambulate.  He is accompanied today by his brother Todd Davidson  Overall he is at his baseline.  No urinary tract symptoms, hematuria or dysuria.  No recent illness or fevers.  Overall stable weight and appetite.  Also stable limited functional status.  [facility] was reviewed with his brother Todd Davidson  Again, this demonstrates interval enlargement of a deep margin enhancing nodule at the ablation site measuring 9 mm, previously 5 mm.  This consistent with residual or recurrent tumor along the medial ablation margin.  No acute finding.  Past Medical History:  Diagnosis Date   Arthritis    neck, shoulders, hips, knees   Asthma    as child, out grew   Barrett esophagus    BPH (benign prostatic hyperplasia)    Coronary artery disease    annual stress with Dr. KHumphrey Rolls no new findings   Coronary artery disease    Dementia ([place]    Depression    Di[place] complication [place]    no medications currently was on insulin but was dropping blood sugars too low causing syncope   Diverticulosis    Duodenitis    Gastritis    GERD (gastroesophageal reflux disease)    History of COVID-19 02/2019   History of kidney stones    Hypercholesteremia     Hypertension    Left renal mass    Leg fracture, right    wears brace   Neuromuscular disorder (HCC)    neuropathy - feet   Occasional tremors    hands   Prostate cancer ([place]    Renal insufficiency    Sleep apnea    doesn't wear CPAP, pt denies   Wears dentures    full upper    Past Surgical History:  Procedure Laterality Date   [place]   stents placed   CATARACT EXTRACTION W/PHACO Left 10/29/2014   Procedure: CATARACT EXTRACTION PHAC[place] AND [place]TRAOCULAR [place]  Surgeon: CLeandrew Koyanagi MD;  [facility]  Service: Ophthalmology;  Laterality: Left;  DIABETIC - insulin MALYUGIN   CERVICAL FUSION     CORONARY ANGIOPLASTY     ESOPHAGOGASTRODUODENOSCOPY N/A 12/29/2017   Procedure: ESOPHAGOGASTRODUODENOSCOPY (EGD);  Surgeon: SLollie Sails MD;  [facility];  Service: Endoscopy;  Laterality: N/A;   ESOPHAGOGASTRODUODENOSCOPY (EGD) WITH PROPOFOL N/A 12/17/2015   Procedure: ESOPHAGOGASTRODUODENOSCOPY (EGD) WITH PROPOFOL;  Surgeon: MLollie Sails MD;  [facility];  Service: Endoscopy;  Laterality: N/A;   EYE SURGERY Right    HERNIA REPAIR     x2   I & D EXTREMITY Right 10/31/2018   Procedure: IRRIGATION AND DEBRIDEMENT RIGHT KNEE  EXTERNAL FIXATION OF FRACTURE;  Surgeon: HKatha Hamming  P, MD;  Location: [place];  Service: Orthopedics;  Laterality: Right;  IRRIGATION AND DEBRIDEMENT RIGHT KNEE  EXTERNAL FIXATION OF FRACTURE   IR RADIOLOGIST EVAL & MGMT  02/28/2019   IR RADIOLOGIST EVAL & MGMT  04/30/2019   IR RADIOLOGIST EVAL & MGMT  07/02/2019   IR RADIOLOGIST EVAL & MGMT  10/10/2019   IR RADIOLOGIST EVAL & MGMT  06/25/2020   IR RADIOLOGIST EVAL & MGMT  10/14/2020   IRRIGATION AND DEBRIDEMENT KNEE Right 10/31/2018   JOINT REPLACEMENT     knee   KNEE FUSION     1973    KNEE SURGERY Right    joint removal with fusion and graft   NISSEN FUNDOPLICATION     RADIOFREQUENCY ABLATION  Left 06/05/2019   Procedure: LEFT RENAL  CRYO ABLATION;  Surgeon: Greggory Keen, MD;  Location: [facility];  Service: Anesthesiology;  Laterality: Left;    Allergies: Macrolides and ketolides, Erythromycin, Propranolol, Sulfa antibiotics, Sulfa antibiotics, Sulfasalazine, Tape, Tapentadol, Tapentadol, Telbivudine, and Tape  Medications: Prior to Admission medications   Medication Sig Start Date End Date Taking? Authorizing Provider  ACCU-CHEK AVIVA PLUS test strip USE 1 STRIP TO CHECK GLUCOSE 4 TIMES DAILY 08/13/17   [provider]  acetaminophen (TYLENOL) 325 MG tablet Take 2 tablets (650 mg total) by mouth every 6 (six) hours as needed for mild pain. 11/05/18   Norm Parcel, PA-C  amLODipine (NORVASC) 10 MG tablet Take 10 mg by mouth daily. 09/04/18   [provider]  aspirin 325 MG tablet Take 325 mg by mouth daily. AM    [provider]  benazepril (LOTENSIN) 40 MG tablet Take 40 mg by mouth at bedtime.  09/04/18   [provider]  citalopram (CELEXA) 10 MG tablet Take 10 mg by mouth at bedtime.  10/30/18   [provider]  clotrimazole (LOTRIMIN) 1 % cream Apply 1 application topically 2 (two) times daily. 08/01/19   Arta Silence, MD  finasteride (PROSCAR) 5 MG tablet TAKE 1 TABLET EVERY DAY Patient taking differently: Take 5 mg by mouth at bedtime. 04/05/19   Stoioff, Ronda Fairly, MD  Fluticasone-Umeclidin-Vilant (TRELEGY ELLIPTA) 100-62.5-25 MCG/INH AEPB Inhale 1 puff into the lungs daily. 07/28/20   Lavera Guise, MD  gabapentin (NEURONTIN) 300 MG capsule Take 300 mg by mouth 2 (two) times daily.  09/27/18   [provider]  heparin 5000 UNIT/ML injection Inject 1 mL (5,000 Units total) into the skin every 8 (eight) hours. 11/05/18   Norm Parcel, PA-C  hydrocortisone (ANUSOL-HC) 2.5 % rectal cream Apply 1 application topically daily as needed. 01/31/20   [provider]  ipratropium-albuterol (DUONEB) 0.5-2.5 (3) MG/3ML SOLN  Take 3 mLs by nebulization.    [provider]  methocarbamol (ROBAXIN) 500 MG tablet Take 1 tablet (500 mg total) by mouth every 8 (eight) hours as needed for muscle spasms. 11/05/18   Norm Parcel, PA-C  nitroGLYCERIN (NITROSTAT) 0.4 MG SL tablet Place under the tongue. 06/05/12   [provider]  omeprazole (PRILOSEC) 40 MG capsule Take 40 mg by mouth daily.     [provider]  Respiratory Therapy Supplies (SOOTHENEB NBL 100 ADULT MASK) MISC Use with breathing treatment ( pt with dementia has problem inhaling with mouth piece ) 12/04/19   Lavera Guise, MD  rosuvastatin (CRESTOR) 40 MG tablet Take 40 mg by mouth at bedtime.  09/18/18   [provider]  tamsulosin (FLOMAX) 0.4 MG CAPS capsule TAKE 1  CAPSULE (0.4 MG TOTAL) BY MOUTH DAILY AFTER BREAKFAST. Patient taking differently: Take 0.4 mg by mouth daily. 04/05/19   Stoioff, Ronda Fairly, MD     Family History  Problem Relation Age of Onset   Prostate cancer Father    Heart disease Brother    Stroke Mother    Breast cancer Sister     Social History   Socioeconomic History   Marital status: Widowed    Spouse name: Not on file   Number of children: Not on file   Years of education: Not on file   Highest education level: Not on file  Occupational History   Not on file  Tobacco Use   Smoking status: Former    Packs/day: 1.00    Years: 20.00    Pack years: 20.00    Types: Cigarettes, Pipe, Cigars    Quit date: 03/21/1973    Years since quitting: 47.6   Smokeless tobacco: Current    Types: Snuff   Tobacco comments:    SMOKELESS TOBACCO  Vaping Use   Vaping Use: Never used  Substance and Sexual Activity   Alcohol use: Yes    Alcohol/week: 25.0 standard drinks    Types: 25 Cans of beer per week    Comment: 4-6 a day   Drug use: Never   Sexual activity: Not Currently  Other Topics Concern   Not on file  Social History Narrative   ** Merged History Encounter **       Social Determinants  of Health   Financial Resource Strain: Not on file  Food Insecurity: Not on file  Transportation Needs: Not on file  Physical Activity: Not on file  Stress: Not on file  Social Connections: Not on file     Review of Systems: A 12 point ROS discussed and pertinent positives are indicated in the HPI above.  All other systems are negative.  Review of Systems  Vital Signs: BP (!) 147/77   Resp 18   Ht '5\' 11"'$  (1.803 m)   SpO2 97%   BMI 22.12 kg/m   Physical Exam Constitutional:      General: He is not in acute distress.    Appearance: He is not ill-appearing or diaphoretic.  Eyes:     General: No scleral icterus.    Conjunctiva/sclera: Conjunctivae normal.  Cardiovascular:     Rate and Rhythm: Normal rate and regular rhythm.  Pulmonary:     Effort: Pulmonary effort is normal.     Breath sounds: Normal breath sounds.  Abdominal:     General: Abdomen is flat. Bowel sounds are normal.     Palpations: Abdomen is soft.  Skin:    General: Skin is warm and dry.     Coloration: Skin is not jaundiced.  Neurological:     Mental Status: He is alert. Mental status is at baseline.  Psychiatric:        Mood and Affect: Mood normal.        Thought Content: Thought content normal.    Imaging: CT ABDOMEN W WO CONTRAST  Result Date: 10/14/2020 CLINICAL DATA:  Left renal mass. Status post biopsy and cryoablation. EXAM: CT ABDOMEN WITHOUT AND WITH CONTRAST TECHNIQUE: Multidetector CT imaging of the abdomen was performed following the standard protocol before and following the bolus administration of intravenous contrast. CONTRAST:  178m OMNIPAQUE IOHEXOL 300 MG/ML  SOLN COMPARISON:  06/19/2020 FINDINGS: Lower chest: No acute abnormality. Hepatobiliary: No focal liver abnormality is seen. No gallstones, gallbladder wall thickening,  or biliary dilatation. Pancreas: Unremarkable. No pancreatic ductal dilatation or surrounding inflammatory changes. Spleen: Normal in size without focal  abnormality. Adrenals/Urinary Tract: Normal adrenal glands. Upper pole left kidney cryoablation zone is again noted. This measures 2.2 x 2.2 cm, image 34/3. Unchanged from previous exam. However, along the medial margin of the ablation zone is an arterial phase enhancing nodule measuring 8 mm, image 34/3. On the previous exam this measured 5 mm. Measured off the coronal images this measures 0.8 cm, image 63/9. Formally 0.6 cm. This nodule has similar enhancement to the renal cortex on the arterial phase images, but appears relatively hypodense to the renal cortex on the current nephrographic and delayed phase images. Multiple bilateral kidney cysts are again seen the largest arises off the anterior cortex of the interpolar right kidney measuring 4.9 cm, image 47/3. Hyperdense kidney lesion arising off the anterolateral aspect of the inferior pole of left kidney measures 1.6 cm, image 52/3. No enhancement identified within this lesion compatible with Bosniak class 2 cyst. No hydronephrosis identified.  No perinephric fat stranding. Stomach/Bowel: Stomach is within normal limits. Appendix appears normal. No evidence of bowel wall thickening, distention, or inflammatory changes. Vascular/Lymphatic: Aortic atherosclerosis. No aneurysm. No enlarged upper abdominal lymph nodes. Patent vasculature within the upper abdomen. Other: No ascites or focal fluid collections. Musculoskeletal: No acute or significant osseous findings. IMPRESSION: 1. Stable appearance of cryo ablation zone within the superior pole of left kidney. There is an arterial phase enhancing nodule along the medial margin of the ablation zone which has increased in size in the interval. Cannot rule out local tumor recurrence. 2. Stable Bosniak class 1 and 2 kidney cysts. 3. Aortic atherosclerosis. Aortic Atherosclerosis (ICD10-I70.0). Electronically Signed   By: Kerby Moors M.D.   On: 10/14/2020 10:21   IR Radiologist Eval & Mgmt  Result Date:  10/14/2020 Please refer to notes tab for details about interventional procedure. (Op Note)   Labs:  CBC: No results for input(s): WBC, HGB, HCT, PLT in the last 8760 hours.  COAGS: No results for input(s): INR, APTT in the last 8760 hours.  BMP: Recent Labs    06/19/20 1022 10/13/20 0953  CREATININE 1.00 0.80    LIVER FUNCTION TESTS: No results for input(s): BILITOT, AST, ALT, ALKPHOS, PROT, ALBUMIN in the last 8760 hours.   Assessment and Plan:  1 year 26-monthstatus post left renal cell carcinoma cryoablation.  Surveillance imaging shows development of a medial deep margin enhancing 9 mm nodule along the renal cortex previously measuring 5 mm.  This is consistent with margin recurrence or residual tumor by CT.  Today's visit is to assess his progressive dementia and functional status.  Overall, he is able to perform his activities of daily living with limited assistance.  He can stand on his own from the chair.  He does walk with a walker.  He does have dementia but is alert this morning and able to carry on a conversation.  No signs of heart failure or respiratory distress.  After assessing the patient today, I think he would be able to tolerate a second cryoablation procedure with anesthesia.  Surveillance at 3 months could also be performed.  Options were discussed with the patient and his brother.  After discussion, they would like to proceed with continued surveillance at 3 months and still consider repeat ablation later this year if there is continued slow growth of the ablation site recurrence.  Plan: [facility] (late October/early November).   Electronically Signed: Greggory Keen, MD 10/23/2020, 12:21 PM   I spent a total of    40 Minutes in face to face in clinical consultation, greater than 50% of which was counseling/coordinating care for this patient status post ablation for left renal  cell carcinoma

## 2020-10-30 DIAGNOSIS — I10 Essential (primary) hypertension: Secondary | ICD-10-CM | POA: Diagnosis not present

## 2020-10-30 DIAGNOSIS — M542 Cervicalgia: Secondary | ICD-10-CM | POA: Diagnosis not present

## 2020-10-30 DIAGNOSIS — F5101 Primary insomnia: Secondary | ICD-10-CM | POA: Diagnosis not present

## 2020-10-30 DIAGNOSIS — F32A Depression, unspecified: Secondary | ICD-10-CM | POA: Diagnosis not present

## 2020-10-30 DIAGNOSIS — E782 Mixed hyperlipidemia: Secondary | ICD-10-CM | POA: Diagnosis not present

## 2020-10-30 DIAGNOSIS — E1165 Type 2 diabetes mellitus with hyperglycemia: Secondary | ICD-10-CM | POA: Diagnosis not present

## 2020-11-02 ENCOUNTER — Telehealth: Payer: Self-pay | Admitting: Student

## 2020-11-02 ENCOUNTER — Telehealth: Payer: Self-pay

## 2020-11-02 NOTE — Telephone Encounter (Signed)
(  4:30 pm) SW completed a return call to patient's brother [name]. He requested assistance with a mobile meal referral for patient. SW inquired if assistance was needed with bathing also and he stated no. SW advised him that referral will be completed.  -SW completed  a referral  for mobile meals through [employer] on [address] [employer].

## 2020-11-02 NOTE — Telephone Encounter (Signed)
Returned call to patient's brother [name], and have scheduled a Palliative f/u visit for 11/03/20 @ 9 AM.

## 2020-11-03 ENCOUNTER — Other Ambulatory Visit: Payer: Self-pay

## 2020-11-03 ENCOUNTER — Other Ambulatory Visit: Payer: Medicare Other | Admitting: Student

## 2020-11-03 DIAGNOSIS — R0602 Shortness of breath: Secondary | ICD-10-CM | POA: Diagnosis not present

## 2020-11-03 DIAGNOSIS — Z515 Encounter for palliative care: Secondary | ICD-10-CM

## 2020-11-03 DIAGNOSIS — R52 Pain, unspecified: Secondary | ICD-10-CM

## 2020-11-03 DIAGNOSIS — R630 Anorexia: Secondary | ICD-10-CM

## 2020-11-03 DIAGNOSIS — F0391 Unspecified dementia with behavioral disturbance: Secondary | ICD-10-CM

## 2020-11-03 DIAGNOSIS — F10220 Alcohol dependence with intoxication, uncomplicated: Secondary | ICD-10-CM

## 2020-11-03 NOTE — Progress Notes (Signed)
[facility] Palliative Care Consult Note Telephone: [phone]  Fax: [phone]    Date of encounter: 11/03/20 PATIENT NAME: Todd Davidson [address] [place] [address]   [phone] (home)  DOB: 1935-05-21 MRN: [id number] PRIMARY CARE PROVIDER:    Perrin Maltese, MD,  [address] [place] [address] [phone]  REFERRING PROVIDER:   Perrin Maltese, MD [place],  [place] [address] [phone]  RESPONSIBLE PARTY:    Contact Information     Name Relation Home Work Mobile   Krasinski,Michael Brother [phone]  [phone]        I met face to face with patient and family in the home. Palliative Care was asked to follow this patient by consultation request of  Perrin Maltese, MD to address advance care planning and complex medical decision making. This is a follow up visit.                                   ASSESSMENT AND PLAN / RECOMMENDATIONS:   Advance Care Planning/Goals of Care: Goals include to maximize quality of life and symptom management. Discussed code status today; brother [name] would like to think about it further. Will complete on next visit.  CODE STATUS: Full Code  Symptom Management/Plan:  Appetite- patient with varied appetite, weight is stable; education provided on well balanced diet, adding in fruits, vegetables, proteins. Recommend nutritional supplement if patient is eating 50%  of a meal; continue supplements daily. Monitor for weight loss. Meals on Wheels initiated per palliative SW.   Dementia-patient to continue with 24/7 caregivers; continue supportive care. Monitor for falls/safety. Walker for ambulation. Monitor for cognitive and functional changes/declines.   Pain-patient reports occasional neck pain. Recommend warm pack/rice pack for up to 20 minutes. Continue ibuprofen as needed.   Alcohol dependence- counseled patient and family on limiting alcohol intake.  Recommended decreasing to two beers a day.   Dyspnea-secondary to asthma, COPD. Patient reports improvement since starting Trelegy inhaler; continue as directed. Continue duoneb PRN as directed.   Follow up Palliative Care Visit: Palliative care will continue to follow for complex medical decision making, advance care planning, and clarification of goals. Return in 8 weeks or prn.  I spent 40 minutes providing this consultation. More than 50% of the time in this consultation was spent in counseling and care coordination.   PPS: 50%  HOSPICE ELIGIBILITY/DIAGNOSIS: TBD  Chief Complaint: Palliative Medicine follow up visit.   HISTORY OF PRESENT ILLNESS:  Todd Davidson is a 85 y.o. year old adult  with dementia, CAD, left renal mass, prostate cancer, asthma, COPD, hypertension.   Patient resides at home with 24/7 caregivers; brother [name] also involved in his care. Patient reports doing well overall. He states that his shortness of breath has improved since starting new Trelegy inhaler. He is using the duoneb PRN. He denies pain; does report occasional neck pain. Patient endorses a good appetite; while caregivers state he eats poorly. He is drinking 2-3 nutritional supplements daily. His last A1C was 7.5. Family does report patient drinking 3 beers daily and becomes angry if he is not given his beer. No other behaviors or functional changes reported. Patient endorses sleeping well; using trazodone PRN QHS. Patient uses rollator walker for ambulation. No recent fall or injury reported. No recent ER visits or hospitalizations. Patient has follow up for renal mass in three months  per brother.    History obtained from review of EMR, discussion with primary team, and interview with family, facility staff/caregiver and/or Todd Davidson.  I reviewed available labs, medications, imaging, studies and related documents from the EMR.  Records reviewed and summarized above.   ROS  General: NAD EYES: denies  vision changes ENMT: denies dysphagia Cardiovascular: denies chest pain Pulmonary: denies cough, denies increased SOB Abdomen: denies constipation, endorses continence of bowel GU: denies dysuria,  denies hematuria, endorses continence of urine MSK:  denies weakness,  no falls reported Skin: denies rashes or wounds Neurological: denies pain, denies insomnia Psych: Endorses positive mood Heme/lymph/immuno: denies bruises, abnormal bleeding  Physical Exam: Pulse 72, resp 16, b/p 150/84, sats 95-97% on room air Constitutional: NAD General: frail appearing, thin EYES: anicteric sclera, lids intact, no discharge  ENMT: intact hearing, oral mucous membranes moist, dentition intact CV: S1S2, RRR, no LE edema Pulmonary: LCTA, no increased work of breathing, no cough, room air Abdomen: normo-active BS + 4 quadrants, soft and non tender GU: deferred MSK: moves all extremities, ambulatory with rollator Skin: warm and dry, no rashes or wounds on visible skin Neuro: generalized weakness, A & O x 2 Psych: non-anxious affect, pleasant  Hem/lymph/immuno: no widespread bruising   Thank you for the opportunity to participate in the care of Todd Davidson.  The palliative care team will continue to follow. Please call our office at [phone] if we can be of additional assistance.   Ezekiel Slocumb, NP   COVID-19 PATIENT SCREENING TOOL Asked and negative response unless otherwise noted:   Have you had symptoms of covid, tested positive or been in contact with someone with symptoms/positive test in the past 5-10 days? No

## 2020-11-13 ENCOUNTER — Other Ambulatory Visit: Payer: Self-pay

## 2020-12-09 DIAGNOSIS — G8929 Other chronic pain: Secondary | ICD-10-CM | POA: Diagnosis not present

## 2020-12-09 DIAGNOSIS — E1165 Type 2 diabetes mellitus with hyperglycemia: Secondary | ICD-10-CM | POA: Diagnosis not present

## 2020-12-09 DIAGNOSIS — F32A Depression, unspecified: Secondary | ICD-10-CM | POA: Diagnosis not present

## 2020-12-29 ENCOUNTER — Other Ambulatory Visit: Payer: Self-pay

## 2020-12-29 ENCOUNTER — Other Ambulatory Visit: Payer: Medicare Other | Admitting: Student

## 2020-12-29 DIAGNOSIS — Z515 Encounter for palliative care: Secondary | ICD-10-CM | POA: Diagnosis not present

## 2020-12-29 DIAGNOSIS — F0390 Unspecified dementia without behavioral disturbance: Secondary | ICD-10-CM

## 2020-12-29 DIAGNOSIS — R52 Pain, unspecified: Secondary | ICD-10-CM | POA: Diagnosis not present

## 2020-12-29 DIAGNOSIS — R0602 Shortness of breath: Secondary | ICD-10-CM

## 2020-12-29 DIAGNOSIS — E1165 Type 2 diabetes mellitus with hyperglycemia: Secondary | ICD-10-CM

## 2020-12-29 NOTE — Progress Notes (Signed)
[facility] Consult Note Telephone: [phone]  Fax: [phone]    Date of encounter: 12/29/20 9:21 AM PATIENT NAME: Todd Davidson [address] [place] [address]   [phone] (home)  DOB: February 10, 1936 MRN: [id number] PRIMARY CARE PROVIDER:    Perrin Maltese, MD,  [address] [place] [address] [phone]  REFERRING PROVIDER:   Perrin Maltese, MD [place],  [place] [address] [phone]  RESPONSIBLE PARTY:    Contact Information     Name Relation Home Work Mobile   Todd Davidson Brother [phone]  [phone]        I met face to face with patient and family in the home. Palliative Care was asked to follow this patient by consultation request of  Perrin Maltese, MD to address advance care planning and complex medical decision making. This is a follow up visit.                                   ASSESSMENT AND PLAN / RECOMMENDATIONS:   Advance Care Planning/Goals of Care: Goals include to maximize quality of life and symptom management.   CODE STATUS: Full code  Symptom Management/Plan:  Dementia-patient to continue with 24/7 caregivers; continue supportive care. Monitor for falls/safety. Walker for ambulation. Monitor for cognitive and functional changes/declines.    Pain-Continue Lidocaine 4% patch; off for 12 hours/off 12 hours. Apply warm pack/rice pack for up to 20 minutes TID PRN.  Dyspnea-secondary to asthma, COPD. Patient reports improvement since starting Trelegy inhaler; continue as directed. Continue duoneb PRN as directed.   T2DM- Most recent hemoglobin A1C is 7.5. Continue checking blood sugars AC,QHS. Administer Lantus 16 units daily each evening per Pcp directions. Education provided on Lantus and administering at same time daily and diet.   Follow up Palliative Care Visit: Palliative care will continue to follow for complex medical decision making, advance care  planning, and clarification of goals. Return 8 weeks or prn.  I spent 40 minutes providing this consultation. More than 50% of the time in this consultation was spent in counseling and care coordination.   PPS: 50%  HOSPICE ELIGIBILITY/DIAGNOSIS: TBD  Chief Complaint: Palliative medicine follow up visit.   HISTORY OF PRESENT ILLNESS:  Todd Davidson is a 85 y.o. year old adult  with  dementia, CAD, left renal mass, prostate cancer, asthma, COPD, hypertension.   Patient resides at home with 24/7 caregivers. Brother Todd Davidson is involved in his care. Patient states he has been doing well. He denies having any recent changes or declines. He does report occasional neck pain, he is using lidocaine patches with some relief.  He states his shortness of breath has been much improved since starting the Trelegy inhaler; uses duo nebs as needed.  Patient endorses a good appetite. Both brother Todd Davidson and caregiver Larene Beach states his appetite has greatly improved. His blood checks blood sugars are checked 3 times a day and at bedtime.  Morning blood sugars run between 80s and 90s mostly, noon blood sugars in the 170s, dinner blood sugars average around 180. His night time blood sugars vary between 150s and 180s. Patient did have 1 episode in the last month where he stated he was not feeling well and his blood sugar was 80m/dL. Patient uses walker for ambulation. No recent falls reported.  No recent infections, ED visits or hospitalizations.  Brother MLegrand Comoreports patient  has upcoming procedure related to his kidney cancer in November. A  10-point review of systems is negative, except for the pertinent positives and negatives detailed in the HPI.   History obtained from review of EMR, discussion with primary team, and interview with family, facility staff/caregiver and/or Mr. Dingee.  I reviewed available labs, medications, imaging, studies and related documents from the EMR.  Records reviewed and summarized  above.    Physical Exam:  Pulse 72, resp 16, b/p 150/70, sats 97% on room air. Constitutional: NAD General: frail appearing EYES: anicteric sclera, lids intact, no discharge  ENMT: intact hearing, oral mucous membranes moist, dentition intact CV: S1S2, RRR, no LE edema Pulmonary: LCTA, no increased work of breathing, no cough, room air Abdomen: normo-active BS + 4 quadrants, soft and non tender GU: deferred MSK: moves all extremities, ambulatory with walker Skin: warm and dry, no rashes or wounds on visible skin Neuro: generalized weakness, A & O x 2 Psych: non-anxious affect, pleasant Hem/lymph/immuno: no widespread bruising   Thank you for the opportunity to participate in the care of Mr. Zuercher.  The palliative care team will continue to follow. Please call our office at [phone] if we can be of additional assistance.   Ezekiel Slocumb, NP   COVID-19 PATIENT SCREENING TOOL Asked and negative response unless otherwise noted:   Have you had symptoms of covid, tested positive or been in contact with someone with symptoms/positive test in the past 5-10 days? No

## 2020-12-31 ENCOUNTER — Inpatient Hospital Stay
Admission: EM | Admit: 2020-12-31 | Discharge: 2021-01-18 | DRG: 070 | Disposition: A | Discharge status: Skilled Nursing Facility | Payer: Medicare Other | Attending: Internal Medicine | Admitting: Internal Medicine

## 2020-12-31 ENCOUNTER — Emergency Department: Payer: Medicare Other

## 2020-12-31 ENCOUNTER — Other Ambulatory Visit: Payer: Self-pay

## 2020-12-31 ENCOUNTER — Encounter: Payer: Self-pay | Admitting: Internal Medicine

## 2020-12-31 ENCOUNTER — Telehealth: Payer: Self-pay

## 2020-12-31 DIAGNOSIS — Z79899 Other long term (current) drug therapy: Secondary | ICD-10-CM

## 2020-12-31 DIAGNOSIS — Z85528 Personal history of other malignant neoplasm of kidney: Secondary | ICD-10-CM | POA: Diagnosis not present

## 2020-12-31 DIAGNOSIS — Z91048 Other nonmedicinal substance allergy status: Secondary | ICD-10-CM

## 2020-12-31 DIAGNOSIS — Z515 Encounter for palliative care: Secondary | ICD-10-CM | POA: Diagnosis not present

## 2020-12-31 DIAGNOSIS — F32A Depression, unspecified: Secondary | ICD-10-CM | POA: Diagnosis not present

## 2020-12-31 DIAGNOSIS — J9601 Acute respiratory failure with hypoxia: Principal | ICD-10-CM | POA: Diagnosis present

## 2020-12-31 DIAGNOSIS — E119 Type 2 diabetes mellitus without complications: Secondary | ICD-10-CM | POA: Diagnosis not present

## 2020-12-31 DIAGNOSIS — I129 Hypertensive chronic kidney disease with stage 1 through stage 4 chronic kidney disease, or unspecified chronic kidney disease: Secondary | ICD-10-CM | POA: Diagnosis not present

## 2020-12-31 DIAGNOSIS — Z741 Need for assistance with personal care: Secondary | ICD-10-CM | POA: Diagnosis not present

## 2020-12-31 DIAGNOSIS — I6782 Cerebral ischemia: Secondary | ICD-10-CM | POA: Diagnosis not present

## 2020-12-31 DIAGNOSIS — F05 Delirium due to known physiological condition: Secondary | ICD-10-CM | POA: Diagnosis present

## 2020-12-31 DIAGNOSIS — Z23 Encounter for immunization: Secondary | ICD-10-CM

## 2020-12-31 DIAGNOSIS — E1122 Type 2 diabetes mellitus with diabetic chronic kidney disease: Secondary | ICD-10-CM | POA: Diagnosis not present

## 2020-12-31 DIAGNOSIS — F03918 Unspecified dementia, unspecified severity, with other behavioral disturbance: Secondary | ICD-10-CM | POA: Diagnosis present

## 2020-12-31 DIAGNOSIS — Z7951 Long term (current) use of inhaled steroids: Secondary | ICD-10-CM

## 2020-12-31 DIAGNOSIS — I10 Essential (primary) hypertension: Secondary | ICD-10-CM | POA: Diagnosis not present

## 2020-12-31 DIAGNOSIS — M6259 Muscle wasting and atrophy, not elsewhere classified, multiple sites: Secondary | ICD-10-CM | POA: Diagnosis not present

## 2020-12-31 DIAGNOSIS — Z7982 Long term (current) use of aspirin: Secondary | ICD-10-CM

## 2020-12-31 DIAGNOSIS — N400 Enlarged prostate without lower urinary tract symptoms: Secondary | ICD-10-CM | POA: Diagnosis present

## 2020-12-31 DIAGNOSIS — E7800 Pure hypercholesterolemia, unspecified: Secondary | ICD-10-CM | POA: Diagnosis present

## 2020-12-31 DIAGNOSIS — F0390 Unspecified dementia without behavioral disturbance: Secondary | ICD-10-CM | POA: Diagnosis present

## 2020-12-31 DIAGNOSIS — Z7401 Bed confinement status: Secondary | ICD-10-CM | POA: Diagnosis not present

## 2020-12-31 DIAGNOSIS — R404 Transient alteration of awareness: Secondary | ICD-10-CM | POA: Diagnosis not present

## 2020-12-31 DIAGNOSIS — J3489 Other specified disorders of nose and nasal sinuses: Secondary | ICD-10-CM | POA: Diagnosis not present

## 2020-12-31 DIAGNOSIS — Z7189 Other specified counseling: Secondary | ICD-10-CM | POA: Diagnosis not present

## 2020-12-31 DIAGNOSIS — M25539 Pain in unspecified wrist: Secondary | ICD-10-CM

## 2020-12-31 DIAGNOSIS — N183 Chronic kidney disease, stage 3 unspecified: Secondary | ICD-10-CM | POA: Diagnosis present

## 2020-12-31 DIAGNOSIS — G9341 Metabolic encephalopathy: Principal | ICD-10-CM | POA: Diagnosis present

## 2020-12-31 DIAGNOSIS — K219 Gastro-esophageal reflux disease without esophagitis: Secondary | ICD-10-CM | POA: Diagnosis present

## 2020-12-31 DIAGNOSIS — Z881 Allergy status to other antibiotic agents status: Secondary | ICD-10-CM

## 2020-12-31 DIAGNOSIS — Z888 Allergy status to other drugs, medicaments and biological substances status: Secondary | ICD-10-CM

## 2020-12-31 DIAGNOSIS — Z8546 Personal history of malignant neoplasm of prostate: Secondary | ICD-10-CM

## 2020-12-31 DIAGNOSIS — G319 Degenerative disease of nervous system, unspecified: Secondary | ICD-10-CM | POA: Diagnosis not present

## 2020-12-31 DIAGNOSIS — E11649 Type 2 diabetes mellitus with hypoglycemia without coma: Secondary | ICD-10-CM | POA: Diagnosis not present

## 2020-12-31 DIAGNOSIS — J690 Pneumonitis due to inhalation of food and vomit: Secondary | ICD-10-CM | POA: Diagnosis present

## 2020-12-31 DIAGNOSIS — Z87891 Personal history of nicotine dependence: Secondary | ICD-10-CM

## 2020-12-31 DIAGNOSIS — Z8249 Family history of ischemic heart disease and other diseases of the circulatory system: Secondary | ICD-10-CM

## 2020-12-31 DIAGNOSIS — R0602 Shortness of breath: Secondary | ICD-10-CM | POA: Diagnosis not present

## 2020-12-31 DIAGNOSIS — E876 Hypokalemia: Secondary | ICD-10-CM | POA: Diagnosis not present

## 2020-12-31 DIAGNOSIS — U071 COVID-19: Secondary | ICD-10-CM

## 2020-12-31 DIAGNOSIS — R32 Unspecified urinary incontinence: Secondary | ICD-10-CM

## 2020-12-31 DIAGNOSIS — J441 Chronic obstructive pulmonary disease with (acute) exacerbation: Secondary | ICD-10-CM | POA: Diagnosis present

## 2020-12-31 DIAGNOSIS — D696 Thrombocytopenia, unspecified: Secondary | ICD-10-CM | POA: Diagnosis present

## 2020-12-31 DIAGNOSIS — Z882 Allergy status to sulfonamides status: Secondary | ICD-10-CM

## 2020-12-31 DIAGNOSIS — Z794 Long term (current) use of insulin: Secondary | ICD-10-CM | POA: Diagnosis not present

## 2020-12-31 DIAGNOSIS — E1165 Type 2 diabetes mellitus with hyperglycemia: Secondary | ICD-10-CM | POA: Diagnosis not present

## 2020-12-31 DIAGNOSIS — E161 Other hypoglycemia: Secondary | ICD-10-CM | POA: Diagnosis not present

## 2020-12-31 DIAGNOSIS — Z66 Do not resuscitate: Secondary | ICD-10-CM | POA: Diagnosis present

## 2020-12-31 DIAGNOSIS — M19031 Primary osteoarthritis, right wrist: Secondary | ICD-10-CM | POA: Diagnosis not present

## 2020-12-31 DIAGNOSIS — Z8616 Personal history of COVID-19: Secondary | ICD-10-CM | POA: Diagnosis not present

## 2020-12-31 DIAGNOSIS — R4020 Unspecified coma: Secondary | ICD-10-CM | POA: Diagnosis not present

## 2020-12-31 DIAGNOSIS — J9600 Acute respiratory failure, unspecified whether with hypoxia or hypercapnia: Secondary | ICD-10-CM | POA: Diagnosis present

## 2020-12-31 DIAGNOSIS — I639 Cerebral infarction, unspecified: Secondary | ICD-10-CM | POA: Diagnosis not present

## 2020-12-31 DIAGNOSIS — R159 Full incontinence of feces: Secondary | ICD-10-CM | POA: Diagnosis present

## 2020-12-31 DIAGNOSIS — E162 Hypoglycemia, unspecified: Secondary | ICD-10-CM | POA: Diagnosis not present

## 2020-12-31 DIAGNOSIS — Z8042 Family history of malignant neoplasm of prostate: Secondary | ICD-10-CM

## 2020-12-31 DIAGNOSIS — F03C11 Unspecified dementia, severe, with agitation: Secondary | ICD-10-CM | POA: Diagnosis not present

## 2020-12-31 DIAGNOSIS — Z743 Need for continuous supervision: Secondary | ICD-10-CM | POA: Diagnosis not present

## 2020-12-31 DIAGNOSIS — Z955 Presence of coronary angioplasty implant and graft: Secondary | ICD-10-CM

## 2020-12-31 DIAGNOSIS — J45901 Unspecified asthma with (acute) exacerbation: Secondary | ICD-10-CM

## 2020-12-31 DIAGNOSIS — J449 Chronic obstructive pulmonary disease, unspecified: Secondary | ICD-10-CM | POA: Diagnosis not present

## 2020-12-31 DIAGNOSIS — I2510 Atherosclerotic heart disease of native coronary artery without angina pectoris: Secondary | ICD-10-CM | POA: Diagnosis present

## 2020-12-31 DIAGNOSIS — R2681 Unsteadiness on feet: Secondary | ICD-10-CM | POA: Diagnosis not present

## 2020-12-31 DIAGNOSIS — G4730 Sleep apnea, unspecified: Secondary | ICD-10-CM | POA: Diagnosis present

## 2020-12-31 LAB — COMPREHENSIVE METABOLIC PANEL
ALT: 16 U/L (ref 0–44)
AST: 33 U/L (ref 15–41)
Albumin: 4 g/dL (ref 3.5–5.0)
Alkaline Phosphatase: 74 U/L (ref 38–126)
Anion gap: 10 (ref 5–15)
BUN: 10 mg/dL (ref 8–23)
CO2: 26 mmol/L (ref 22–32)
Calcium: 9 mg/dL (ref 8.9–10.3)
Chloride: 104 mmol/L (ref 98–111)
Creatinine, Ser: 0.81 mg/dL (ref 0.61–1.24)
GFR, Estimated: >60 mL/min (ref >60)
Glucose, Bld: 143 mg/dL — ABNORMAL HIGH (ref 70–99)
Potassium: 3 mmol/L — ABNORMAL LOW (ref 3.5–5.1)
Sodium: 140 mmol/L (ref 135–145)
Total Bilirubin: 0.8 mg/dL (ref 0.3–1.2)
Total Protein: 7.5 g/dL (ref 6.5–8.1)

## 2020-12-31 LAB — MAGNESIUM: Magnesium: 1.7 mg/dL (ref 1.7–2.4)

## 2020-12-31 LAB — RESP PANEL BY RT-PCR (FLU A&B, COVID) ARPGX2
Influenza A by PCR: NEGATIVE
Influenza B by PCR: NEGATIVE
SARS Coronavirus 2 by RT PCR: NEGATIVE

## 2020-12-31 LAB — CBC WITH DIFFERENTIAL/PLATELET
Abs Immature Granulocytes: 0.03 10*3/uL (ref 0.00–0.07)
Basophils Absolute: 0 10*3/uL (ref 0.0–0.1)
Basophils Relative: 0 %
Eosinophils Absolute: 0 10*3/uL (ref 0.0–0.5)
Eosinophils Relative: 0 %
HCT: 44.7 % (ref 39.0–52.0)
Hemoglobin: 16.1 g/dL (ref 13.0–17.0)
Immature Granulocytes: 0 %
Lymphocytes Relative: 5 %
Lymphs Abs: 0.6 10*3/uL — ABNORMAL LOW (ref 0.7–4.0)
MCH: 31.1 pg (ref 26.0–34.0)
MCHC: 36 g/dL (ref 30.0–36.0)
MCV: 86.5 fL (ref 80.0–100.0)
Monocytes Absolute: 1.1 10*3/uL — ABNORMAL HIGH (ref 0.1–1.0)
Monocytes Relative: 9 %
Neutro Abs: 10.3 10*3/uL — ABNORMAL HIGH (ref 1.7–7.7)
Neutrophils Relative %: 86 %
Platelets: 163 10*3/uL (ref 150–400)
RBC: 5.17 MIL/uL (ref 4.22–5.81)
RDW: 13.4 % (ref 11.5–15.5)
WBC: 12.1 10*3/uL — ABNORMAL HIGH (ref 4.0–10.5)
nRBC: 0 % (ref 0.0–0.2)

## 2020-12-31 LAB — CBG MONITORING, ED
Glucose-Capillary: 133 mg/dL — ABNORMAL HIGH (ref 70–99)
Glucose-Capillary: 93 mg/dL (ref 70–99)
Glucose-Capillary: 82 mg/dL (ref 70–99)
Glucose-Capillary: 58 mg/dL — ABNORMAL LOW (ref 70–99)
Glucose-Capillary: 58 mg/dL — ABNORMAL LOW (ref 70–99)
Glucose-Capillary: 156 mg/dL — ABNORMAL HIGH (ref 70–99)
Glucose-Capillary: 149 mg/dL — ABNORMAL HIGH (ref 70–99)
Glucose-Capillary: 97 mg/dL (ref 70–99)

## 2020-12-31 LAB — BLOOD GAS, VENOUS
Acid-base deficit: 2.2 mmol/L — ABNORMAL HIGH (ref 0.0–2.0)
Bicarbonate: 25.1 mmol/L (ref 20.0–28.0)
O2 Saturation: 76.5 %
Patient temperature: 37
pCO2, Ven: 51 mmHg (ref 44.0–60.0)
pH, Ven: 7.3 (ref 7.250–7.430)
pO2, Ven: 46 mmHg — ABNORMAL HIGH (ref 32.0–45.0)

## 2020-12-31 LAB — BRAIN NATRIURETIC PEPTIDE: B Natriuretic Peptide: 283.3 pg/mL — ABNORMAL HIGH (ref 0.0–100.0)

## 2020-12-31 LAB — TROPONIN I (HIGH SENSITIVITY)
Troponin I (High Sensitivity): 15 ng/L (ref <18)
Troponin I (High Sensitivity): 16 ng/L (ref <18)

## 2020-12-31 LAB — PROCALCITONIN: Procalcitonin: <0.1 ng/mL

## 2020-12-31 MED ORDER — PANTOPRAZOLE SODIUM 40 MG PO TBEC: 40.0000 mg | DELAYED_RELEASE_TABLET | Freq: Every day | ORAL | Status: DC

## 2020-12-31 MED ORDER — CITALOPRAM HYDROBROMIDE 20 MG PO TABS
10.0000 mg | ORAL_TABLET | Freq: Every day | ORAL | Status: DC
  Administered 2021-01-01: 10 mg via ORAL
  Filled 2020-12-31: qty 1

## 2020-12-31 MED ORDER — ROSUVASTATIN CALCIUM 10 MG PO TABS
40.0000 mg | ORAL_TABLET | Freq: Every day | ORAL | Status: DC
  Administered 2021-01-01: 40 mg via ORAL
  Filled 2020-12-31 (×2): qty 2

## 2020-12-31 MED ORDER — FLUTICASONE-UMECLIDIN-VILANT 100-62.5-25 MCG/INH IN AEPB: 1.0000 | INHALATION_SPRAY | Freq: Every day | RESPIRATORY_TRACT | Status: DC

## 2020-12-31 MED ORDER — DEXTROSE 50 % IV SOLN
INTRAVENOUS | Status: AC
  Filled 2020-12-31: qty 50

## 2020-12-31 MED ORDER — ENOXAPARIN SODIUM 40 MG/0.4ML IJ SOSY
40.0000 mg | PREFILLED_SYRINGE | INTRAMUSCULAR | Status: DC
  Administered 2020-12-31 – 2021-01-17 (×18): 40 mg via SUBCUTANEOUS
  Filled 2020-12-31 (×18): qty 0.4

## 2020-12-31 MED ORDER — POTASSIUM CHLORIDE CRYS ER 20 MEQ PO TBCR
40.0000 meq | EXTENDED_RELEASE_TABLET | Freq: Once | ORAL | Status: DC
  Filled 2020-12-31: qty 2

## 2020-12-31 MED ORDER — POTASSIUM CHLORIDE CRYS ER 20 MEQ PO TBCR: 40.0000 meq | EXTENDED_RELEASE_TABLET | Freq: Once | ORAL | Status: DC

## 2020-12-31 MED ORDER — METHYLPREDNISOLONE SODIUM SUCC 125 MG IJ SOLR
125.0000 mg | Freq: Once | INTRAMUSCULAR | Status: AC
  Administered 2020-12-31: 125 mg via INTRAVENOUS
  Filled 2020-12-31: qty 2

## 2020-12-31 MED ORDER — SODIUM CHLORIDE 0.9 % IV SOLN
3.0000 g | Freq: Once | INTRAVENOUS | Status: AC
  Administered 2020-12-31: 3 g via INTRAVENOUS

## 2020-12-31 MED ORDER — DEXTROSE-NACL 10-0.45 % IV SOLN
INTRAVENOUS | Status: DC
  Filled 2020-12-31 (×4): qty 1000

## 2020-12-31 MED ORDER — TAMSULOSIN HCL 0.4 MG PO CAPS: 0.4000 mg | ORAL_CAPSULE | Freq: Every day | ORAL | Status: DC

## 2020-12-31 MED ORDER — ASPIRIN 81 MG PO CHEW: 81.0000 mg | CHEWABLE_TABLET | Freq: Every day | ORAL | Status: DC

## 2020-12-31 MED ORDER — FINASTERIDE 5 MG PO TABS: 5.0000 mg | ORAL_TABLET | Freq: Every day | ORAL | Status: DC

## 2020-12-31 MED ORDER — GUAIFENESIN ER 600 MG PO TB12: 600.0000 mg | ORAL_TABLET | Freq: Two times a day (BID) | ORAL | Status: DC | PRN

## 2020-12-31 MED ORDER — BUDESONIDE 0.25 MG/2ML IN SUSP
0.2500 mg | Freq: Two times a day (BID) | RESPIRATORY_TRACT | Status: DC
  Administered 2020-12-31 – 2021-01-07 (×15): 0.25 mg via RESPIRATORY_TRACT
  Filled 2020-12-31 (×14): qty 2

## 2020-12-31 MED ORDER — ACETAMINOPHEN 325 MG PO TABS: 650.0000 mg | ORAL_TABLET | Freq: Four times a day (QID) | ORAL | Status: DC | PRN

## 2020-12-31 MED ORDER — AMLODIPINE BESYLATE 5 MG PO TABS: 10.0000 mg | ORAL_TABLET | Freq: Every day | ORAL | Status: DC

## 2020-12-31 MED ORDER — ONDANSETRON HCL 4 MG/2ML IJ SOLN
4.0000 mg | Freq: Four times a day (QID) | INTRAMUSCULAR | Status: DC | PRN
  Administered 2021-01-01: 4 mg via INTRAVENOUS
  Filled 2020-12-31 (×2): qty 2

## 2020-12-31 MED ORDER — MAGNESIUM SULFATE 2 GM/50ML IV SOLN
2.0000 g | Freq: Once | INTRAVENOUS | Status: AC
  Administered 2021-01-01: 2 g via INTRAVENOUS
  Filled 2020-12-31: qty 50

## 2020-12-31 MED ORDER — BENAZEPRIL HCL 20 MG PO TABS
40.0000 mg | ORAL_TABLET | Freq: Every day | ORAL | Status: DC
  Administered 2021-01-01: 40 mg via ORAL
  Filled 2020-12-31 (×4): qty 2

## 2020-12-31 MED ORDER — HALOPERIDOL LACTATE 5 MG/ML IJ SOLN: 2.5000 mg | Freq: Once | INTRAMUSCULAR | Status: AC

## 2020-12-31 MED ORDER — IPRATROPIUM-ALBUTEROL 0.5-2.5 (3) MG/3ML IN SOLN: 3.0000 mL | Freq: Four times a day (QID) | RESPIRATORY_TRACT | Status: DC | PRN

## 2020-12-31 MED ORDER — HALOPERIDOL LACTATE 5 MG/ML IJ SOLN
INTRAMUSCULAR | Status: AC
  Administered 2020-12-31: 2.5 mg
  Filled 2020-12-31: qty 1

## 2020-12-31 MED ORDER — POTASSIUM CHLORIDE 10 MEQ/100ML IV SOLN
10.0000 meq | INTRAVENOUS | Status: AC
  Administered 2020-12-31 – 2021-01-01 (×6): 10 meq via INTRAVENOUS
  Filled 2020-12-31 (×3): qty 100

## 2020-12-31 MED ORDER — ONDANSETRON HCL 4 MG PO TABS: 4.0000 mg | ORAL_TABLET | Freq: Four times a day (QID) | ORAL | Status: DC | PRN

## 2020-12-31 MED ORDER — DEXTROSE 50 % IV SOLN
1.0000 | Freq: Once | INTRAVENOUS | Status: AC
  Administered 2020-12-31: 50 mL via INTRAVENOUS

## 2020-12-31 MED ORDER — PANTOPRAZOLE SODIUM 40 MG PO TBEC
40.0000 mg | DELAYED_RELEASE_TABLET | Freq: Every day | ORAL | Status: DC | PRN
  Administered 2020-12-31: 40 mg via ORAL

## 2020-12-31 MED ORDER — DEXTROSE-NACL 10-0.45 % IV SOLN
INTRAVENOUS | Status: DC
  Filled 2020-12-31 (×2): qty 1000

## 2020-12-31 MED ORDER — AMLODIPINE BESYLATE 10 MG PO TABS
10.0000 mg | ORAL_TABLET | Freq: Every day | ORAL | Status: DC
  Administered 2021-01-02: 10 mg via ORAL
  Filled 2020-12-31: qty 1

## 2020-12-31 MED ORDER — ASPIRIN 81 MG PO CHEW
81.0000 mg | CHEWABLE_TABLET | Freq: Every day | ORAL | Status: DC
  Administered 2021-01-02: 81 mg via ORAL
  Filled 2020-12-31 (×2): qty 1

## 2020-12-31 MED ORDER — SODIUM CHLORIDE 0.9 % IV SOLN
3.0000 g | Freq: Once | INTRAVENOUS | Status: AC
  Administered 2020-12-31: 3 g via INTRAVENOUS
  Filled 2020-12-31: qty 8

## 2020-12-31 MED ORDER — IPRATROPIUM-ALBUTEROL 0.5-2.5 (3) MG/3ML IN SOLN
9.0000 mL | Freq: Once | RESPIRATORY_TRACT | Status: AC
  Administered 2020-12-31: 9 mL via RESPIRATORY_TRACT
  Filled 2020-12-31: qty 3

## 2020-12-31 MED ORDER — GUAIFENESIN ER 600 MG PO TB12: 600.0000 mg | ORAL_TABLET | Freq: Two times a day (BID) | ORAL | Status: DC

## 2020-12-31 MED ORDER — NITROGLYCERIN 0.4 MG SL SUBL: 0.4000 mg | SUBLINGUAL_TABLET | SUBLINGUAL | Status: DC | PRN

## 2020-12-31 NOTE — ED Notes (Signed)
Messaged md regarding pts hypoglycemia awaiting orders
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
## 2020-12-31 NOTE — ED Notes (Signed)
Pt wet the bed and continues to try to get out of bed , pt confused, ed rn charge aware of pts fall risk, pt has non slip socks, call light in reach and bed in low position, pt on bed alarm as well

## 2020-12-31 NOTE — ED Triage Notes (Signed)
Pt here from [facility] via [facility] with AMD and hypoglycemia. Pt cbg was 35 on ems arrival and was given an amp of D50. CBG up to 153 but pt was 80% with RA. Pt placed on NRB at 15L with sats up to 95%. Pt has hx of DM amd CHF.

## 2020-12-31 NOTE — H&P (Signed)
History and Physical    Todd Davidson GQQ:[id number] DOB: 1936-02-08 DOA: 12/31/2020  PCP: Perrin Maltese, MD   Patient coming from: Home  I have personally briefly reviewed patient's old medical records in [place]  Chief Complaint: Change in mental status  Most of the history was obtained from patient's brother at the bedside due to his altered mental status and history of dementia  HPI: Todd Davidson is a 85 y.o. male with medical history significant for insulin-dependent diabetes mellitus, history of malignant neoplasm of prostate, history of dementia with behavioral disturbance as well as asthma/COPD who was brought into the ER by EMS for evaluation of change in mental status. Patient lives at home and has caregivers round-the-clock.  His brother states that he gets his insulin at about 6 PM every evening after his supper but does not think he ate well last night.  This morning they were unable to arouse patient and he checked his blood sugar and it was 34 so they called EMS.  EMS arrived and administered 1 amp of D50 with improvement in his blood sugar to 153 and improvement in patient's mental status.  He was noted to have increased work of breathing and room air pulse oximetry of 80% with tachypnea and use of accessory muscles.  He was placed on a nonrebreather mask at 15 L by EMS with improvement in his pulse oximetry to 95% and transported to the ER. In the emergency room he was transitioned to BiPAP and remains on a D10 drip for his hypoglycemia. I am unable to do a review of systems on this patient due to his history of dementia and his altered mental status. VBG 7.30/51/46/25/76.5 Sodium 140, potassium 3.0, chloride 104, bicarb 26, glucose 143, BUN 10, creatinine 0.81, calcium 9.0, alkaline phosphatase 74, albumin 4.0, AST 33, ALT 16, total protein 7.5, BNP 283, troponin 15, procalcitonin less than 0.10, white count 12.1, hemoglobin 16.1, hematocrit 44.7, MCV 86.5, RDW  13.4, platelet count 163 Respiratory viral panel is negative Twelve-lead EKG reviewed by me shows sinus tachycardia with PVCs.  ED Course: Patient is an 85 year old male who was brought into the ER by EMS for evaluation of change in mental status.  Patient received Lantus 16 units at about 6 PM the night prior to his admission and his family was unable to arouse him on the day of admission.  He checked her blood sugar and it was 34 and so EMS was called. Patient received 1 amp of D50 with improvement in his blood sugar and mental status but was noted to be in respiratory distress with room air pulse oximetry of 80%, he was using accessory muscles and was placed on a nonrebreather mask at 15 L with improvement in his pulse oximetry. Chest x-ray shows a right lower lobe pneumonia suspicious for possible aspiration. Patient remains on BiPAP and will be admitted to the hospital for further evaluation.   Review of Systems: As per HPI otherwise all other systems reviewed and negative.    Past Medical History:  Diagnosis Date   Arthritis    neck, shoulders, hips, knees   Asthma    as child, out grew   Barrett esophagus    BPH (benign prostatic hyperplasia)    Coronary artery disease    annual stress with Dr. Humphrey Rolls. no new findings   Coronary artery disease    Dementia ([place])    Depression    Diabetes mellitus without complication ([place])  no medications currently was on insulin but was dropping blood sugars too low causing syncope   Diverticulosis    Duodenitis    Gastritis    GERD (gastroesophageal reflux disease)    History of COVID-19 02/2019   History of kidney stones    Hypercholesteremia    Hypertension    Left renal mass    Leg fracture, right    wears brace   Neuromuscular disorder (HCC)    neuropathy - feet   Occasional tremors    hands   Prostate cancer ([place])    Renal insufficiency    Sleep apnea    doesn't wear CPAP, pt denies   Wears dentures    full upper     Past Surgical History:  Procedure Laterality Date   [place], 2000   stents placed   CATARACT EXTRACTION W/PHACO Left 10/29/2014   Procedure: CATARACT EXTRACTION PHACO AND INTRAOCULAR LENS PLACEMENT ([place]);  Surgeon: Leandrew Koyanagi, MD;  Location: [facility];  Service: Ophthalmology;  Laterality: Left;  DIABETIC - insulin MALYUGIN   CERVICAL FUSION     CORONARY ANGIOPLASTY     ESOPHAGOGASTRODUODENOSCOPY N/A 12/29/2017   Procedure: ESOPHAGOGASTRODUODENOSCOPY (EGD);  Surgeon: [name], MD;  Location: [facility];  Service: Endoscopy;  Laterality: N/A;   ESOPHAGOGASTRODUODENOSCOPY (EGD) WITH PROPOFOL N/A 12/17/2015   Procedure: ESOPHAGOGASTRODUODENOSCOPY (EGD) WITH PROPOFOL;  Surgeon: [name], MD;  Location: [facility] ENDOSCOPY;  Service: Endoscopy;  Laterality: N/A;   EYE SURGERY Right    HERNIA REPAIR     x2   I & D EXTREMITY Right 10/31/2018   Procedure: IRRIGATION AND DEBRIDEMENT RIGHT KNEE  EXTERNAL FIXATION OF FRACTURE;  Surgeon: Shona Needles, MD;  Location: [facility];  Service: Orthopedics;  Laterality: Right;  IRRIGATION AND DEBRIDEMENT RIGHT KNEE  EXTERNAL FIXATION OF FRACTURE   IR RADIOLOGIST EVAL & MGMT  02/28/2019   IR RADIOLOGIST EVAL & MGMT  04/30/2019   IR RADIOLOGIST EVAL & MGMT  07/02/2019   IR RADIOLOGIST EVAL & MGMT  10/10/2019   IR RADIOLOGIST EVAL & MGMT  06/25/2020   IR RADIOLOGIST EVAL & MGMT  10/14/2020   IRRIGATION AND DEBRIDEMENT KNEE Right 10/31/2018   JOINT REPLACEMENT     knee   KNEE FUSION     1973    KNEE SURGERY Right    joint removal with fusion and graft   NISSEN FUNDOPLICATION     RADIOFREQUENCY ABLATION Left 06/05/2019   Procedure: LEFT RENAL  CRYO ABLATION;  Surgeon: Greggory Keen, MD;  Location: [facility];  Service: Anesthesiology;  Laterality: Left;     reports that he quit smoking about 47 years ago. His smoking use included cigarettes, pipe, and cigars. He has a 20.00 pack-year  smoking history. His smokeless tobacco use includes snuff. He reports current alcohol use of about 25.0 standard drinks per week. He reports that he does not use drugs.  Allergies  Allergen Reactions   Macrolides And Ketolides Swelling    "-mycin" drugs "-mycin" drugs "-mycin" drugs    Erythromycin     Other reaction(s): Unknown   Propranolol    Sulfa Antibiotics Swelling and Other (See Comments)    Other reaction(s): Other (See Comments)   Sulfa Antibiotics Swelling   Sulfasalazine Swelling   Tape Other (See Comments)    Irritates the skin - raw   Tapentadol Other (See Comments)    Heavy tape causes raw skin   Tapentadol  Telbivudine Swelling    "mycin" drugs    Tape Other (See Comments) and Rash    Heavy tape causes raw skin    Family History  Problem Relation Age of Onset   Prostate cancer Father    Heart disease Brother    Stroke Mother    Breast cancer Sister       Prior to Admission medications   Medication Sig Start Date End Date Taking? Authorizing Provider  ACCU-CHEK AVIVA PLUS test strip USE 1 STRIP TO CHECK GLUCOSE 4 TIMES DAILY 08/13/17   [provider]  acetaminophen (TYLENOL) 325 MG tablet Take 2 tablets (650 mg total) by mouth every 6 (six) hours as needed for mild pain. Patient not taking: Reported on 12/29/2020 11/05/18   Norm Parcel, PA-C  amLODipine (NORVASC) 10 MG tablet Take 10 mg by mouth daily. 09/04/18   [provider]  aspirin 81 MG chewable tablet Chew 81 mg by mouth daily. AM    [provider]  benazepril (LOTENSIN) 40 MG tablet Take 40 mg by mouth at bedtime.  09/04/18   [provider]  citalopram (CELEXA) 10 MG tablet Take 10 mg by mouth at bedtime.  10/30/18   [provider]  clotrimazole (LOTRIMIN) 1 % cream Apply 1 application topically 2 (two) times daily. 08/01/19   Arta Silence, MD  finasteride (PROSCAR) 5 MG tablet TAKE 1 TABLET EVERY DAY Patient taking differently: Take 5 mg  by mouth at bedtime. 04/05/19   Stoioff, Ronda Fairly, MD  Fluticasone-Umeclidin-Vilant (TRELEGY ELLIPTA) 100-62.5-25 MCG/INH AEPB Inhale 1 puff into the lungs daily. 07/28/20   Lavera Guise, MD  gabapentin (NEURONTIN) 300 MG capsule Take 300 mg by mouth 2 (two) times daily.  09/27/18   [provider]  guaiFENesin (MUCINEX) 600 MG 12 hr tablet Take 600 mg by mouth 2 (two) times daily.    [provider]  heparin 5000 UNIT/ML injection Inject 1 mL (5,000 Units total) into the skin every 8 (eight) hours. 11/05/18   Norm Parcel, PA-C  hydrocortisone (ANUSOL-HC) 2.5 % rectal cream Apply 1 application topically daily as needed. 01/31/20   [provider]  insulin glargine (LANTUS) 100 UNIT/ML injection Inject 16 Units into the skin daily. Administer daily at 6pm.    [provider]  ipratropium-albuterol (DUONEB) 0.5-2.5 (3) MG/3ML SOLN Take 3 mLs by nebulization.    [provider]  Lidocaine (HM LIDOCAINE PATCH) 4 % PTCH Apply 1 patch topically daily. On for 12 hours, off for 12 hours.    [provider]  methocarbamol (ROBAXIN) 500 MG tablet Take 1 tablet (500 mg total) by mouth every 8 (eight) hours as needed for muscle spasms. Patient not taking: Reported on 12/29/2020 11/05/18   Norm Parcel, PA-C  nitroGLYCERIN (NITROSTAT) 0.4 MG SL tablet Place under the tongue. 06/05/12   [provider]  omeprazole (PRILOSEC) 40 MG capsule Take 40 mg by mouth daily.     [provider]  Respiratory Therapy Supplies (SOOTHENEB NBL 100 ADULT MASK) MISC Use with breathing treatment ( pt with dementia has problem inhaling with mouth piece ) 12/04/19   Lavera Guise, MD  rosuvastatin (CRESTOR) 40 MG tablet Take 40 mg by mouth at bedtime.  09/18/18   [provider]  tamsulosin (FLOMAX) 0.4 MG CAPS capsule TAKE 1 CAPSULE (0.4 MG TOTAL) BY MOUTH DAILY AFTER BREAKFAST. Patient taking differently: Take 0.4 mg by mouth daily. 04/05/19   Stoioff,  Ronda Fairly, MD  Physical Exam: Vitals:   12/31/20 0907 12/31/20 0930 12/31/20 1000 12/31/20 1036  BP:  (!) 156/78 (!) 145/88   Pulse:  92 88   Resp:  (!) 23 (!) 26   Temp:    98.4 F (36.9 C)  TempSrc:    Rectal  SpO2:  96% 98%   Weight: 72.6 kg     Height: 6\' 2"  (1.88 m)        Vitals:   12/31/20 0907 12/31/20 0930 12/31/20 1000 12/31/20 1036  BP:  (!) 156/78 (!) 145/88   Pulse:  92 88   Resp:  (!) 23 (!) 26   Temp:    98.4 F (36.9 C)  TempSrc:    Rectal  SpO2:  96% 98%   Weight: 72.6 kg     Height: 6\' 2"  (1.88 m)         Constitutional: Lethargic but opens eyes to verbal stimuli.  Appears to be in respiratory distress  HEENT:      Head: Normocephalic and atraumatic.         Eyes: PERLA, EOMI, Conjunctivae are normal. Sclera is non-icteric.       Mouth/Throat: Mucous membranes are moist.       Neck: Supple with no signs of meningismus. Cardiovascular: Tachycardic. No murmurs, gallops, or rubs. 2+ symmetrical distal pulses are present . No JVD. No LE edema Respiratory: Tachypneic.scattered wheezes in both lung fields.  Rhonchi right lung base.  Gastrointestinal: Soft, non tender, and non distended with positive bowel sounds.  Genitourinary: No CVA tenderness. Musculoskeletal: Nontender with normal range of motion in all extremities. No cyanosis, or erythema of extremities. Neurologic:  Face is symmetric.  Unable to assess Skin: Skin is warm, dry.  Ecchymosis on both forearms Psychiatric: Unable to assess   Labs on Admission: I have personally reviewed following labs and imaging studies  CBC: Recent Labs  Lab 12/31/20 0903  WBC 12.1*  NEUTROABS 10.3*  HGB 16.1  HCT 44.7  MCV 86.5  PLT 856   Basic Metabolic Panel: Recent Labs  Lab 12/31/20 0903  NA 140  K 3.0*  CL 104  CO2 26  GLUCOSE 143*  BUN 10  CREATININE 0.81  CALCIUM 9.0   GFR: Estimated Creatinine Clearance: 72.2 mL/min (by C-G formula based on SCr of 0.81 mg/dL). Liver Function  Tests: Recent Labs  Lab 12/31/20 0903  AST 33  ALT 16  ALKPHOS 74  BILITOT 0.8  PROT 7.5  ALBUMIN 4.0   No results for input(s): LIPASE, AMYLASE in the last 168 hours. No results for input(s): AMMONIA in the last 168 hours. Coagulation Profile: No results for input(s): INR, PROTIME in the last 168 hours. Cardiac Enzymes: No results for input(s): CKTOTAL, CKMB, CKMBINDEX, TROPONINI in the last 168 hours. BNP (last 3 results) No results for input(s): PROBNP in the last 8760 hours. HbA1C: No results for input(s): HGBA1C in the last 72 hours. CBG: Recent Labs  Lab 12/31/20 0904 12/31/20 1001 12/31/20 1033 12/31/20 1132  GLUCAP 133* 93 82 58*   Lipid Profile: No results for input(s): CHOL, HDL, LDLCALC, TRIG, CHOLHDL, LDLDIRECT in the last 72 hours. Thyroid Function Tests: No results for input(s): TSH, T4TOTAL, FREET4, T3FREE, THYROIDAB in the last 72 hours. Anemia Panel: No results for input(s): VITAMINB12, FOLATE, FERRITIN, TIBC, IRON, RETICCTPCT in the last 72 hours. Urine analysis:    Component Value Date/Time   COLORURINE YELLOW 10/31/2018 1950   APPEARANCEUR CLEAR 10/31/2018 1950   APPEARANCEUR Hazy 06/16/2012 1849  LABSPEC 1.044 (H) 10/31/2018 1950   LABSPEC 1.012 06/16/2012 1849   PHURINE 5.0 10/31/2018 1950   GLUCOSEU NEGATIVE 10/31/2018 1950   GLUCOSEU 50 mg/dL 06/16/2012 1849   HGBUR MODERATE (A) 10/31/2018 [place] NEGATIVE 10/31/2018 1950   BILIRUBINUR Negative 06/16/2012 1849   KETONESUR 5 (A) 10/31/2018 1950   PROTEINUR 100 (A) 10/31/2018 1950   NITRITE NEGATIVE 10/31/2018 1950   LEUKOCYTESUR NEGATIVE 10/31/2018 1950   LEUKOCYTESUR Trace 06/16/2012 1849    Radiological Exams on Admission: DG Chest Portable 1 View  Result Date: 12/31/2020 CLINICAL DATA:  Shortness of breath, hypoglycemia EXAM: PORTABLE CHEST 1 VIEW COMPARISON:  Chest radiograph 08/03/2020 FINDINGS: The cardiomediastinal silhouette is within normal limits. There are  patchy opacities in the right base, new since the prior study. There is no other focal consolidation. There is no pulmonary edema. There is no pleural effusion or pneumothorax. There is no acute osseous abnormality. IMPRESSION: Patchy opacities in the right base suspicious for infection in the correct clinical setting Electronically Signed   By: Valetta Mole M.D.   On: 12/31/2020 09:43     Assessment/Plan Principal Problem:   Acute metabolic encephalopathy Active Problems:   Diabetes mellitus with hypoglycemia, with long-term current use of insulin (HCC)   Acute respiratory failure (HCC)   Aspiration pneumonia (HCC)   COPD with acute exacerbation (HCC)   Dementia ([place])    Patient is an 85 year old Caucasian male who was brought into the ER for evaluation of change in mental status and respiratory failure requiring oxygen supplementation    Acute metabolic encephalopathy Most likely secondary to hypoglycemia It is unclear how long the patient was down but he was found unresponsive by family members this morning with blood sugar of 34 We will continue D10 infusion and check blood sugars every 2 hours until patient is more awake and alert and able to tolerate oral intake     Acute respiratory failure with hypoxia Most likely secondary to acute COPD exacerbation/aspiration pneumonia Patient had room air pulse oximetry of 80% with increased work of breathing, use of accessory muscles and tachypnea Venous blood gas showed hypoxia Will attempt to wean patient off BiPAP as tolerated     Aspiration pneumonia Patient has a history of dementia and was unresponsive for an unknown period of time Chest x-ray shows right lower lobe infiltrate Will request speech therapy consult for swallow function evaluation once patient is more awake and alert Place patient on Unasyn Aspiration precautions     COPD with acute exacerbation Place patient on as needed and scheduled bronchodilator  therapy as well as inhaled steroids    Dementia Stable Continue Celexa   Hypokalemia Supplement potassium    Hypertension Continue amlodipine and benazepril once patient is able to tolerate oral intake    DVT prophylaxis: Lovenox  Code Status: full code  Family Communication: Greater than 50% of time was spent discussing patient's condition and plan of care with his brother who was at the bedside.  All questions and concerns have been addressed.  He verbalizes understanding and agrees with the plan.  CODE STATUS was discussed and at this time he wishes his brother to be a full code Disposition Plan: Back to previous home environment Consults called: Speech therapy Status:At the time of admission, it appears that the appropriate admission status for this patient is inpatient. This is judged to be reasonable and necessary to provide the required intensity of service to ensure the patient's safety given the presenting symptoms,  physical exam findings, and initial radiographic and laboratory data in the context of their comorbid conditions. Patient requires inpatient status due to high intensity of service, high risk for further deterioration and high frequency of surveillance required.     Collier Bullock MD [facility]     12/31/2020, 11:36 AM

## 2020-12-31 NOTE — Progress Notes (Signed)
SLP Cancellation Note  Patient Details Name: Landan Fedie MRN: [id number] DOB: Aug 09, 1935   Cancelled treatment:       Reason Eval/Treat Not Completed: Medical issues which prohibited therapy  Pt currently in BIPAP.  SLP will follow pt next available time.   [name] B. Rutherford Nail M.S., CCC-SLP, [place] Office [phone]  Todd Davidson 12/31/2020, 11:49 AM

## 2020-12-31 NOTE — Progress Notes (Signed)
Pt taken off bipap and placed on 4lpm [place], sats 99%, respiratory rate 20/min, will continue to monitor.

## 2020-12-31 NOTE — Telephone Encounter (Signed)
12/31/20 @1 :40 PM: PC SW outreached patients brother to make him aware that patient is currently on the waiting list for [facility] in [employer], but there is a waiting list placing as #10 on the list, meaning meals may start for patient for  another few months.   Call unsuccessful. SW left VM with this info as well as info for brother to [employer], [phone] and ask to speak to Jannifer Franklin to inquire about meal assistance in the mean time.

## 2021-01-01 ENCOUNTER — Encounter: Payer: Self-pay | Admitting: Internal Medicine

## 2021-01-01 DIAGNOSIS — J690 Pneumonitis due to inhalation of food and vomit: Secondary | ICD-10-CM | POA: Diagnosis not present

## 2021-01-01 DIAGNOSIS — Z7189 Other specified counseling: Secondary | ICD-10-CM

## 2021-01-01 DIAGNOSIS — Z515 Encounter for palliative care: Secondary | ICD-10-CM | POA: Diagnosis not present

## 2021-01-01 DIAGNOSIS — J9601 Acute respiratory failure with hypoxia: Secondary | ICD-10-CM | POA: Diagnosis not present

## 2021-01-01 DIAGNOSIS — J441 Chronic obstructive pulmonary disease with (acute) exacerbation: Secondary | ICD-10-CM

## 2021-01-01 DIAGNOSIS — G9341 Metabolic encephalopathy: Secondary | ICD-10-CM | POA: Diagnosis not present

## 2021-01-01 LAB — CBC
HCT: 36.4 % — ABNORMAL LOW (ref 39.0–52.0)
Hemoglobin: 12.5 g/dL — ABNORMAL LOW (ref 13.0–17.0)
MCH: 29.6 pg (ref 26.0–34.0)
MCHC: 34.3 g/dL (ref 30.0–36.0)
MCV: 86.1 fL (ref 80.0–100.0)
Platelets: 109 10*3/uL — ABNORMAL LOW (ref 150–400)
RBC: 4.23 MIL/uL (ref 4.22–5.81)
RDW: 13.8 % (ref 11.5–15.5)
WBC: 10.7 10*3/uL — ABNORMAL HIGH (ref 4.0–10.5)
nRBC: 0 % (ref 0.0–0.2)

## 2021-01-01 LAB — URINALYSIS, COMPLETE (UACMP) WITH MICROSCOPIC
Bilirubin Urine: NEGATIVE
Glucose, UA: 50 mg/dL — AB
Ketones, ur: NEGATIVE mg/dL
Leukocytes,Ua: NEGATIVE
Nitrite: NEGATIVE
Protein, ur: >=300 mg/dL — AB
Specific Gravity, Urine: 1.016 (ref 1.005–1.030)
Squamous Epithelial / HPF: NONE SEEN (ref 0–5)
pH: 5 (ref 5.0–8.0)

## 2021-01-01 LAB — BASIC METABOLIC PANEL
Anion gap: 8 (ref 5–15)
BUN: 10 mg/dL (ref 8–23)
CO2: 26 mmol/L (ref 22–32)
Calcium: 8.5 mg/dL — ABNORMAL LOW (ref 8.9–10.3)
Chloride: 104 mmol/L (ref 98–111)
Creatinine, Ser: 0.89 mg/dL (ref 0.61–1.24)
GFR, Estimated: >60 mL/min (ref >60)
Glucose, Bld: 169 mg/dL — ABNORMAL HIGH (ref 70–99)
Potassium: 3.3 mmol/L — ABNORMAL LOW (ref 3.5–5.1)
Sodium: 138 mmol/L (ref 135–145)

## 2021-01-01 LAB — CBG MONITORING, ED
Glucose-Capillary: 141 mg/dL — ABNORMAL HIGH (ref 70–99)
Glucose-Capillary: 144 mg/dL — ABNORMAL HIGH (ref 70–99)
Glucose-Capillary: 162 mg/dL — ABNORMAL HIGH (ref 70–99)
Glucose-Capillary: 165 mg/dL — ABNORMAL HIGH (ref 70–99)
Glucose-Capillary: 87 mg/dL (ref 70–99)
Glucose-Capillary: 96 mg/dL (ref 70–99)

## 2021-01-01 LAB — CK: Total CK: 173 U/L (ref 49–397)

## 2021-01-01 LAB — GLUCOSE, CAPILLARY
Glucose-Capillary: 119 mg/dL — ABNORMAL HIGH (ref 70–99)
Glucose-Capillary: 169 mg/dL — ABNORMAL HIGH (ref 70–99)

## 2021-01-01 MED ORDER — TRAZODONE HCL 50 MG PO TABS
50.0000 mg | ORAL_TABLET | Freq: Every day | ORAL | Status: DC
  Administered 2021-01-01 (×2): 50 mg via ORAL
  Filled 2021-01-01 (×2): qty 1

## 2021-01-01 MED ORDER — HALOPERIDOL LACTATE 5 MG/ML IJ SOLN
5.0000 mg | Freq: Once | INTRAMUSCULAR | Status: AC
  Administered 2021-01-01: 5 mg via INTRAVENOUS
  Filled 2021-01-01: qty 1

## 2021-01-01 MED ORDER — SODIUM CHLORIDE 0.9 % IV SOLN
3.0000 g | Freq: Four times a day (QID) | INTRAVENOUS | Status: DC
  Administered 2021-01-01 – 2021-01-04 (×14): 3 g via INTRAVENOUS
  Filled 2021-01-01 (×7): qty 8
  Filled 2021-01-01: qty 3
  Filled 2021-01-01 (×10): qty 8

## 2021-01-01 NOTE — ED Notes (Signed)
Informed RN bed assigned 

## 2021-01-01 NOTE — Progress Notes (Signed)
Clinical/Bedside Swallow Evaluation Patient Details  Name: Todd Davidson MRN: [id number] Date of Birth: [birth year]  Today's Date: 01/01/2021 Time: SLP Start Time (ACUTE ONLY): 27 SLP Stop Time (ACUTE ONLY): 7902 SLP Time Calculation (min) (ACUTE ONLY): 24 min  Past Medical History:  Past Medical History:  Diagnosis Date   Arthritis    neck, shoulders, hips, knees   Asthma    as child, out grew   Barrett esophagus    BPH (benign prostatic hyperplasia)    Coronary artery disease    annual stress with Dr. Humphrey Rolls. no new findings   Coronary artery disease    Dementia ([place])    Depression    Diabetes mellitus without complication ([place])    no medications currently was on insulin but was dropping blood sugars too low causing syncope   Diverticulosis    Duodenitis    Gastritis    GERD (gastroesophageal reflux disease)    History of COVID-19 02/2019   History of kidney stones    Hypercholesteremia    Hypertension    Left renal mass    Leg fracture, right    wears brace   Neuromuscular disorder (HCC)    neuropathy - feet   Occasional tremors    hands   Prostate cancer ([place])    Renal insufficiency    Sleep apnea    doesn't wear CPAP, pt denies   Wears dentures    full upper   Past Surgical History:  Past Surgical History:  Procedure Laterality Date   [place], 2000   stents placed   CATARACT EXTRACTION W/PHACO Left 10/29/2014   Procedure: CATARACT EXTRACTION PHACO AND INTRAOCULAR LENS PLACEMENT ([place]);  Surgeon: Leandrew Koyanagi, MD;  Location: [place] [facility];  Service: Ophthalmology;  Laterality: Left;  DIABETIC - insulin MALYUGIN   CERVICAL FUSION     CORONARY ANGIOPLASTY     ESOPHAGOGASTRODUODENOSCOPY N/A 12/29/2017   Procedure: ESOPHAGOGASTRODUODENOSCOPY (EGD);  Surgeon: [name], MD;  Location: [facility] ENDOSCOPY;  Service: Endoscopy;  Laterality: N/A;   ESOPHAGOGASTRODUODENOSCOPY (EGD) WITH PROPOFOL N/A 12/17/2015    Procedure: ESOPHAGOGASTRODUODENOSCOPY (EGD) WITH PROPOFOL;  Surgeon: [name], MD;  Location: [facility] ENDOSCOPY;  Service: Endoscopy;  Laterality: N/A;   EYE SURGERY Right    HERNIA REPAIR     x2   I & D EXTREMITY Right 10/31/2018   Procedure: IRRIGATION AND DEBRIDEMENT RIGHT KNEE  EXTERNAL FIXATION OF FRACTURE;  Surgeon: Shona Needles, MD;  Location: [place];  Service: Orthopedics;  Laterality: Right;  IRRIGATION AND DEBRIDEMENT RIGHT KNEE  EXTERNAL FIXATION OF FRACTURE   IR RADIOLOGIST EVAL & MGMT  02/28/2019   IR RADIOLOGIST EVAL & MGMT  04/30/2019   IR RADIOLOGIST EVAL & MGMT  07/02/2019   IR RADIOLOGIST EVAL & MGMT  10/10/2019   IR RADIOLOGIST EVAL & MGMT  06/25/2020   IR RADIOLOGIST EVAL & MGMT  10/14/2020   IRRIGATION AND DEBRIDEMENT KNEE Right 10/31/2018   JOINT REPLACEMENT     knee   KNEE FUSION     1973    KNEE SURGERY Right    joint removal with fusion and graft   NISSEN FUNDOPLICATION     RADIOFREQUENCY ABLATION Left 06/05/2019   Procedure: LEFT RENAL  CRYO ABLATION;  Surgeon: Greggory Keen, MD;  Location: [facility] OR[facility];  Service: Anesthesiology;  Laterality: Left;   HPI:  Todd Davidson is a 85 y.o. male with medical history significant for asthma/COPD, insulin-dependent diabetes mellitus, history  of prostate cancer, dementia with behavioral disturbance, hypertension, who was brought to the hospital because of acute change in mental status/lethargy. Patchy opacities in the right base suspicious for infection in the  correct clinical setting    Assessment / Plan / Recommendation  Clinical Impression  Pt presents with severe cognitive deficits d/t dementia. He was alert but unable to communicate, unable to flow any directions even with maximal multimodal assistance, he is severely distractible.   As such, he presents with severe inattention to oral boluses and is at a high risk of aspiration as well as concern for maintaining hydration and nutrition.   When presented with straw  and spoon, pt opens his mouth to receive trials but then he holds all boluses in his mouth for > 2 minutes. He is not responsive to maximal multi-modal cues to swallow. When he eventually swallows, he is free of overt s/s of aspiration when consuming thin liquids via straw and bites of puree.   When following strict aspiration precuations, recommend initiating a dysphagia 1 diet with thin liquids via straw, medicine crushed in puree. Should s/s of aspiration arise or pt is not able to produce a swallow, please make pt NPO and involve Palliative Care as diet modification is not likely to impact swallow abilities.   SLP Visit Diagnosis: Dysphagia, oral phase (R13.11);Cognitive communication deficit (R41.841)    Aspiration Risk  Moderate aspiration risk;Risk for inadequate nutrition/hydration    Diet Recommendation Dysphagia 1 (Puree);Thin liquid   Liquid Administration via: Straw Medication Administration: Crushed with puree Supervision: Full supervision/cueing for compensatory strategies;Staff to assist with self feeding Compensations: Minimize environmental distractions;Slow rate;Small sips/bites Postural Changes: Seated upright at 90 degrees    Other  Recommendations Oral Care Recommendations: Oral care BID Other Recommendations: Have oral suction available    Recommendations for follow up therapy are one component of a multi-disciplinary discharge planning process, led by the attending physician.  Recommendations may be updated based on patient status, additional functional criteria and insurance authorization.  Follow up Recommendations  (Palliative Care consult)      Frequency and Duration   N/A         Prognosis Prognosis for Safe Diet Advancement:  (poor) Barriers to Reach Goals: Cognitive deficits;Behavior;Severity of deficits;Time post onset      Swallow Study   General Date of Onset: 12/31/20 HPI: Todd Davidson is a 85 y.o. male with medical history significant for  asthma/COPD, insulin-dependent diabetes mellitus, history of prostate cancer, dementia with behavioral disturbance, hypertension, who was brought to the hospital because of acute change in mental status/lethargy. Patchy opacities in the right base suspicious for infection in the  correct clinical setting Type of Study: Bedside Swallow Evaluation Previous Swallow Assessment: none in chart Diet Prior to this Study: NPO Temperature Spikes Noted: No Respiratory Status: Room air History of Recent Intubation: No Behavior/Cognition: Alert;Confused;Distractible;Doesn't follow directions Oral Cavity Assessment: Within Functional Limits Oral Care Completed by SLP: Recent completion by staff Oral Cavity - Dentition: Adequate natural dentition Vision:  (impaired d/t cognition) Self-Feeding Abilities: Total assist Patient Positioning: Upright in bed Baseline Vocal Quality: Not observed Volitional Cough: Cognitively unable to elicit Volitional Swallow: Unable to elicit    Oral/Motor/Sensory Function Overall Oral Motor/Sensory Function:  (grossly functional with trials of thin liquids via cup and puree)   Ice Chips Ice chips: Not tested   Thin Liquid Thin Liquid: Impaired Presentation: Straw Oral Phase Impairments: Poor awareness of bolus Oral Phase Functional Implications: Oral holding  Nectar Thick Nectar Thick Liquid: Not tested   Honey Thick Honey Thick Liquid: Not tested   Puree Puree: Impaired Presentation: Spoon Oral Phase Impairments: Poor awareness of bolus Oral Phase Functional Implications: Oral holding   Solid     Solid: Not tested     Abigayle Wilinski B. Rutherford Nail, M.S., CCC-SLP, [place] [facility] [place] 01/01/2021,1:35 PM

## 2021-01-01 NOTE — Progress Notes (Signed)
Bedside shift change report: order for Tele-monitor expired/renewed order.

## 2021-01-01 NOTE — Consult Note (Signed)
Consultation Note Date: 01/01/2021   Patient Name: Todd Davidson  DOB: 07/26/1935  MRN: [id number]  Age / Sex: 85 y.o., male  PCP: Perrin Maltese, MD Referring Physician: Collier Bullock, MD  Reason for Consultation: Establishing goals of care and Psychosocial/spiritual support  HPI/Patient Profile: 85 y.o. male  with past medical history of insulin-dependent diabetes, history of prostate cancer, dementia with behavioral disturbance, asthma/COPD, CAD, diverticulosis, gastritis, GERD, HTN/HLD, right leg fracture requiring brace, history of COVID, history of kidney stones, Barrett's esophagus admitted on 12/31/2020 with acute metabolic encephalopathy resolving, insulin-dependent diabetes status post hypoglycemia event, with acute hypoxemic respiratory failure secondary to aspiration pneumonia  Clinical Assessment and Goals of Care: I have reviewed medical records including EPIC notes, labs and imaging, received report from RN, assessed the patient and then met at the bedside along with caregiver of just a few days, Theresia Lo, to discuss diagnosis prognosis, GOC, EOL wishes, disposition and options.   I introduced Palliative Medicine as specialized medical care for people living with serious illness. It focuses on providing relief from the symptoms and stress of a serious illness. The goal is to improve quality of life for both the patient and the family.  We discussed a brief life review of the patient.  Mr. Coufal is unmarried and has no children.  His brother, [name], is his healthcare surrogate.  Mr. Domine lives in his own home with paid caregivers around-the-clock.  We then focused on their current illness.  We talk in detail about low blood sugars.  Cyst shares that Mr. Bazile usually drinks 3 beers per night and he worries that this led to complications.  We talked about low blood sugars and the possible need to  stop insulin, we also talked in detail about aspiration pneumonia.  We talk in detail about the speech therapy recommendations for thin liquids and dysphagia 1.  I share that as Mr. Hibberd improves, his diet would likely change.  The natural disease trajectory and expectations at EOL were discussed.  Advanced directives, concepts specific to code status, artifical feeding and hydration, and rehospitalization were not discussed today as this was discussed with brother, and Mr. Mance is DNR.  Palliative Care services outpatient were explained and offered.  We talked about support at home talking about the "what if's and maybe's".  Mr. Jeansonne although he has known dementia, is able to make his needs known, and is somewhat oriented.  He is agreeable to outpatient palliative services to continue goals of care discussions.  Discussed the importance of continued conversation with family and the medical providers regarding overall plan of care and treatment options, ensuring decisions are within the context of the patient's values and GOCs. Questions and concerns were addressed.  The family was encouraged to call with questions or concerns.  PMT will continue to support holistically.  Conference with bedside nursing staff related to patient condition, needs, goals of care, disposition.   HCPOA  HCPOA -brother, Dametri Ozburn is healthcare surrogate    SUMMARY OF RECOMMENDATIONS  At this point continue to treat the treatable but no CPR or intubation Time for outcomes Home with home health/caregivers Outpatient palliative to follow.   Code Status/Advance Care Planning: DNR  Symptom Management:  Per hospitalist, no additional needs at this time.  Palliative Prophylaxis:  Oral Care and Turn Reposition  Additional Recommendations (Limitations, Scope, Preferences): Continue to treat the treatable but no CPR or intubation  Psycho-social/Spiritual:  Desire for further Chaplaincy  support:no Additional Recommendations: Caregiving  Support/Resources  Prognosis:  Unable to determine, based on outcomes.  6 or more months would not be surprising based on prior functional status, chronic illness burden.  Discharge Planning:  To be determined, anticipate home with home health/paid caregivers and outpatient palliative services.       Primary Diagnoses: Present on Admission:  Acute metabolic encephalopathy  Acute respiratory failure (HCC)  Aspiration pneumonia (HCC)  COPD with acute exacerbation ([place])  Dementia ([place])   I have reviewed the medical record, interviewed the patient and family, and examined the patient. The following aspects are pertinent.  Past Medical History:  Diagnosis Date   Arthritis    neck, shoulders, hips, knees   Asthma    as child, out grew   Barrett esophagus    BPH (benign prostatic hyperplasia)    Coronary artery disease    annual stress with Dr. Humphrey Rolls. no new findings   Coronary artery disease    Dementia ([place])    Depression    Diabetes mellitus without complication ([place])    no medications currently was on insulin but was dropping blood sugars too low causing syncope   Diverticulosis    Duodenitis    Gastritis    GERD (gastroesophageal reflux disease)    History of COVID-19 02/2019   History of kidney stones    Hypercholesteremia    Hypertension    Left renal mass    Leg fracture, right    wears brace   Neuromuscular disorder (HCC)    neuropathy - feet   Occasional tremors    hands   Prostate cancer ([place])    Renal insufficiency    Sleep apnea    doesn't wear CPAP, pt denies   Wears dentures    full upper   Social History   Socioeconomic History   Marital status: Widowed    Spouse name: Not on file   Number of children: Not on file   Years of education: Not on file   Highest education level: Not on file  Occupational History   Not on file  Tobacco Use   Smoking status: Former    Packs/day: 1.00    Years:  20.00    Pack years: 20.00    Types: Cigarettes, Pipe, Cigars    Quit date: 03/21/1973    Years since quitting: 47.8   Smokeless tobacco: Current    Types: Snuff   Tobacco comments:    SMOKELESS TOBACCO  Vaping Use   Vaping Use: Never used  Substance and Sexual Activity   Alcohol use: Yes    Alcohol/week: 25.0 standard drinks    Types: 25 Cans of beer per week    Comment: 4-6 a day   Drug use: Never   Sexual activity: Not Currently  Other Topics Concern   Not on file  Social History Narrative   ** Merged History Encounter **       Social Determinants of Health   Financial Resource Strain: Not on file  Food Insecurity: Not on file  Transportation Needs:  Not on file  Physical Activity: Not on file  Stress: Not on file  Social Connections: Not on file   Family History  Problem Relation Age of Onset   Prostate cancer Father    Heart disease Brother    Stroke Mother    Breast cancer Sister    Scheduled Meds:  amLODipine  10 mg Oral Daily   aspirin  81 mg Oral Daily   benazepril  40 mg Oral QHS   budesonide (PULMICORT) nebulizer solution  0.25 mg Nebulization BID   citalopram  10 mg Oral QHS   enoxaparin (LOVENOX) injection  40 mg Subcutaneous Q24H   rosuvastatin  40 mg Oral QHS   traZODone  50 mg Oral QHS   Continuous Infusions:  ampicillin-sulbactam (UNASYN) IV Stopped (01/01/21 1135)   PRN Meds:.acetaminophen, guaiFENesin, ipratropium-albuterol, nitroGLYCERIN, ondansetron **OR** ondansetron (ZOFRAN) IV, pantoprazole Medications Prior to Admission:  Prior to Admission medications   Medication Sig Start Date End Date Taking? Authorizing Provider  amLODipine (NORVASC) 10 MG tablet Take 10 mg by mouth daily. 09/04/18  Yes [provider]  aspirin 81 MG chewable tablet Chew 81 mg by mouth daily. AM   Yes [provider]  benazepril (LOTENSIN) 40 MG tablet Take 40 mg by mouth at bedtime.  09/04/18  Yes [provider]  citalopram (CELEXA) 10  MG tablet Take 10 mg by mouth at bedtime.  10/30/18  Yes [provider]  Fluticasone-Umeclidin-Vilant (TRELEGY ELLIPTA) 100-62.5-25 MCG/INH AEPB Inhale 1 puff into the lungs daily. 07/28/20  Yes Lavera Guise, MD  gabapentin (NEURONTIN) 300 MG capsule Take 300 mg by mouth 2 (two) times daily.  09/27/18  Yes [provider]  LANTUS SOLOSTAR 100 UNIT/ML Solostar Pen Inject 16 Units into the skin daily at 12 noon. 12/22/20  Yes [provider]  rosuvastatin (CRESTOR) 40 MG tablet Take 40 mg by mouth at bedtime.  09/18/18  Yes [provider]  traZODone (DESYREL) 50 MG tablet Take 25-50 mg by mouth at bedtime as needed. 10/30/20  Yes [provider]  ACCU-CHEK AVIVA PLUS test strip USE 1 STRIP TO CHECK GLUCOSE 4 TIMES DAILY 08/13/17   [provider]  acetaminophen (TYLENOL) 325 MG tablet Take 2 tablets (650 mg total) by mouth every 6 (six) hours as needed for mild pain. 11/05/18   Norm Parcel, PA-C  albuterol (PROVENTIL) (2.5 MG/3ML) 0.083% nebulizer solution Take 2.5 mg by nebulization every 6 (six) hours. 10/31/20   [provider]  clotrimazole (LOTRIMIN) 1 % cream Apply 1 application topically 2 (two) times daily. 08/01/19   Arta Silence, MD  finasteride (PROSCAR) 5 MG tablet TAKE 1 TABLET EVERY DAY Patient not taking: Reported on 12/31/2020 04/05/19   Abbie Sons, MD  guaiFENesin (MUCINEX) 600 MG 12 hr tablet Take 600 mg by mouth 2 (two) times daily.    [provider]  heparin 5000 UNIT/ML injection Inject 1 mL (5,000 Units total) into the skin every 8 (eight) hours. Patient not taking: Reported on 12/31/2020 11/05/18   Norm Parcel, PA-C  hydrocortisone (ANUSOL-HC) 2.5 % rectal cream Apply 1 application topically daily as needed. 01/31/20   [provider]  ipratropium-albuterol (DUONEB) 0.5-2.5 (3) MG/3ML SOLN Take 3 mLs by nebulization.    [provider]  Lidocaine 4 % PTCH Apply 1 patch  topically daily. On for 12 hours, off for 12 hours.    [provider]  methocarbamol (ROBAXIN) 500 MG tablet Take 1 tablet (500 mg  total) by mouth every 8 (eight) hours as needed for muscle spasms. Patient not taking: No sig reported 11/05/18   Norm Parcel, PA-C  nitroGLYCERIN (NITROSTAT) 0.4 MG SL tablet Place under the tongue. 06/05/12   [provider]  omeprazole (PRILOSEC) 40 MG capsule Take 40 mg by mouth daily.     [provider]  Respiratory Therapy Supplies (SOOTHENEB NBL 100 ADULT MASK) MISC Use with breathing treatment ( pt with dementia has problem inhaling with mouth piece ) 12/04/19   Lavera Guise, MD  tamsulosin (FLOMAX) 0.4 MG CAPS capsule TAKE 1 CAPSULE (0.4 MG TOTAL) BY MOUTH DAILY AFTER BREAKFAST. Patient not taking: Reported on 12/31/2020 04/05/19   Abbie Sons, MD   Allergies  Allergen Reactions   Macrolides And Ketolides Swelling    "-mycin" drugs "-mycin" drugs "-mycin" drugs    Erythromycin     Other reaction(s): Unknown   Propranolol    Sulfa Antibiotics Swelling and Other (See Comments)    Other reaction(s): Other (See Comments)   Sulfa Antibiotics Swelling   Sulfasalazine Swelling   Tape Other (See Comments)    Irritates the skin - raw   Tapentadol Other (See Comments)    Heavy tape causes raw skin   Tapentadol    Telbivudine Swelling    "mycin" drugs    Tape Other (See Comments) and Rash    Heavy tape causes raw skin   Review of Systems  Unable to perform ROS: Dementia   Physical Exam Vitals and nursing note reviewed.  Constitutional:      General: He is not in acute distress.    Appearance: He is normal weight. He is ill-appearing.  HENT:     Mouth/Throat:     Mouth: Mucous membranes are moist.  Cardiovascular:     Rate and Rhythm: Normal rate.  Pulmonary:     Effort: Pulmonary effort is normal. No respiratory distress.  Skin:    General: Skin is warm and dry.  Neurological:     Mental Status: He is  alert.     Comments: Known memory loss  Psychiatric:     Comments: Calm and cooperative, not fearful    Vital Signs: BP (!) 157/84 (BP Location: Left Arm)   Pulse (!) 52   Temp 98.4 F (36.9 C) (Oral)   Resp 16   Ht '6\' 2"'  (1.88 m)   Wt 72.6 kg   SpO2 94%   BMI 20.54 kg/m  Pain Scale: 0-10   Pain Score: 0-No pain   SpO2: SpO2: 94 % O2 Device:SpO2: 94 % O2 Flow Rate: .O2 Flow Rate (L/min): 3 L/min  IO: Intake/output summary:  Intake/Output Summary (Last 24 hours) at 01/01/2021 1549 Last data filed at 01/01/2021 1135 Gross per 24 hour  Intake 1828.75 ml  Output --  Net 1828.75 ml    LBM: Last BM Date: 12/31/20 Baseline Weight: Weight: 72.6 kg Most recent weight: Weight: 72.6 kg     Palliative Assessment/Data:   Flowsheet Rows    Flowsheet Row Most Recent Value  Intake Tab   Referral Department Hospitalist  Unit at Time of Referral Med/Surg Unit  Palliative Care Primary Diagnosis Other (Comment)  Date Notified 01/01/21  Palliative Care Type New Palliative care  Reason for referral Clarify Goals of Care  Date of Admission 12/31/20  Date first seen by Palliative Care 01/01/21  # of days Palliative referral response time 0 Day(s)  # of days IP prior to Palliative referral 1  Clinical  Assessment   Palliative Performance Scale Score 50%  Pain Max last 24 hours Not able to report  Pain Min Last 24 hours Not able to report  Dyspnea Max Last 24 Hours Not able to report  Dyspnea Min Last 24 hours Not able to report  Psychosocial & Spiritual Assessment   Palliative Care Outcomes        Time In: 1500 Time Out: 1550 Time Total: 50 minutes  Greater than 50%  of this time was spent counseling and coordinating care related to the above assessment and plan.  Signed by: Drue Novel, NP   Please contact Palliative Medicine Team phone at [phone] for questions and concerns.  For individual provider: See Shea Evans

## 2021-01-01 NOTE — ED Notes (Signed)
Hospitalist at bedside assessing pt and updating family on plan of care. Per MD, IVF with dextrose may be stopped at this time. MD also turned off O2. Will continue to monitor.

## 2021-01-01 NOTE — Progress Notes (Addendum)
Progress Note    Todd Davidson  HBZ:[id number] DOB: 21-May-1935  DOA: 12/31/2020 PCP: Perrin Maltese, MD      Brief Narrative:    Medical records reviewed and are as summarized below:  Todd Davidson is a 85 y.o. male with medical history significant for asthma/COPD, insulin-dependent diabetes mellitus, history of prostate cancer, dementia with behavioral disturbance, hypertension, who was brought to the hospital because of acute change in mental status/lethargy.  His blood sugar at home was 34.  He was also noted to have oxygen saturation of 80% associated with use of accessory respiratory muscles.  He was placed on 15 L/min oxygen via nonrebreather mask and transported to the emergency room.   He was admitted to the hospital for hypoglycemia, acute hypoxemic respiratory failure secondary to aspiration pneumonia and COPD exacerbation.  He was treated with BiPAP initially.  He was also treated with IV dextrose, IV antibiotics and bronchodilators.       Assessment/Plan:   Principal Problem:   Acute metabolic encephalopathy Active Problems:   Diabetes mellitus with hypoglycemia, with long-term current use of insulin (HCC)   Acute respiratory failure (HCC)   Aspiration pneumonia (HCC)   COPD with acute exacerbation (HCC)   Dementia (HCC)    Body mass index is 20.54 kg/m.  Insulin-dependent diabetes mellitus, s/p hypoglycemia: Patient has had recurrent hypoglycemia in the past.  He has been taken off of insulin in the past.  He was restarted on insulin by his physician because of hyperglycemia.  He takes Lantus 16 units daily at home.  Hold insulin glargine for now.  Monitor glucose levels closely.  NovoLog as needed for hyperglycemia.  Discontinue IV dextrose infusion.  Acute metabolic encephalopathy/lethargy: Improved.  Aspiration pneumonia: Continue empiric IV Unasyn.  Consult speech therapist for swallow evaluation.  COPD exacerbation, acute hypoxemic respiratory  failure: He is off of BiPAP.  He is tolerating 2 L/min oxygen via nasal cannula.  Taper off oxygen as able.  Continue bronchodilators.  Hypokalemia: Replete potassium and monitor levels  Dementia with behavioral disturbance: Continue supportive care  Other comorbidities include asthma, history of prostate cancer, hypertension  CODE STATUS was discussed with his brother at bedside.  His brother said patient had always wanted to be a DNR.  CODE STATUS changed from full code to DNR in EMR.  Diet Order             Diet NPO time specified  Diet effective now                      Consultants: None  Procedures: None    Medications:    amLODipine  10 mg Oral Daily   aspirin  81 mg Oral Daily   benazepril  40 mg Oral QHS   budesonide (PULMICORT) nebulizer solution  0.25 mg Nebulization BID   citalopram  10 mg Oral QHS   enoxaparin (LOVENOX) injection  40 mg Subcutaneous Q24H   rosuvastatin  40 mg Oral QHS   traZODone  50 mg Oral QHS   Continuous Infusions:  ampicillin-sulbactam (UNASYN) IV Stopped (01/01/21 1135)     Anti-infectives (From admission, onward)    Start     Dose/Rate Route Frequency Ordered Stop   01/01/21 1000  Ampicillin-Sulbactam (UNASYN) 3 g in sodium chloride 0.9 % 100 mL IVPB        3 g 200 mL/hr over 30 Minutes Intravenous Every 6 hours 01/01/21 0924     12/31/20  1630  Ampicillin-Sulbactam (UNASYN) 3 g in sodium chloride 0.9 % 100 mL IVPB        3 g 200 mL/hr over 30 Minutes Intravenous  Once 12/31/20 1116 12/31/20 1709   12/31/20 1015  Ampicillin-Sulbactam (UNASYN) 3 g in sodium chloride 0.9 % 100 mL IVPB        3 g 200 mL/hr over 30 Minutes Intravenous  Once 12/31/20 1003 12/31/20 1059              Family Communication/Anticipated D/C date and plan/Code Status   DVT prophylaxis: enoxaparin (LOVENOX) injection 40 mg Start: 12/31/20 1300     Code Status: DNR  Family Communication: Plan discussed with his brother at the  bedside Disposition Plan: Plan to discharge home in 2 to 3 days   Status is: Inpatient  Remains inpatient appropriate because: Requiring IV antibiotics           Subjective:   Interval events noted.  He has no complaints.  He is confused unable to provide an adequate history.  His brother was at the bedside.  His private caregiver was also at the bedside.  Objective:    Vitals:   01/01/21 1100 01/01/21 1130 01/01/21 1132 01/01/21 1133  BP: 129/75 133/67    Pulse: 81 84 84   Resp:  19 18 19   Temp:  98.4 F (36.9 C)    TempSrc:  Oral    SpO2: 91% 94%  94%  Weight:      Height:       No data found.   Intake/Output Summary (Last 24 hours) at 01/01/2021 1248 Last data filed at 01/01/2021 1135 Gross per 24 hour  Intake 1828.75 ml  Output --  Net 1828.75 ml   Filed Weights   12/31/20 0907  Weight: 72.6 kg    Exam:  GEN: NAD SKIN: Warm and dry EYES: No pallor or icterus ENT: MMM CV: RRR PULM: CTA B ABD: soft, ND, NT, +BS CNS: AAO x 1 (person), non focal, tremor of right upper extremity EXT: No edema or tenderness        Data Reviewed:   I have personally reviewed following labs and imaging studies:  Labs: Labs show the following:   Basic Metabolic Panel: Recent Labs  Lab 12/31/20 0903 12/31/20 1521 01/01/21 0723  NA 140  --  138  K 3.0*  --  3.3*  CL 104  --  104  CO2 26  --  26  GLUCOSE 143*  --  169*  BUN 10  --  10  CREATININE 0.81  --  0.89  CALCIUM 9.0  --  8.5*  MG  --  1.7  --    GFR Estimated Creatinine Clearance: 65.7 mL/min (by C-G formula based on SCr of 0.89 mg/dL). Liver Function Tests: Recent Labs  Lab 12/31/20 0903  AST 33  ALT 16  ALKPHOS 74  BILITOT 0.8  PROT 7.5  ALBUMIN 4.0   No results for input(s): LIPASE, AMYLASE in the last 168 hours. No results for input(s): AMMONIA in the last 168 hours. Coagulation profile No results for input(s): INR, PROTIME in the last 168 hours.  CBC: Recent Labs  Lab  12/31/20 0903 01/01/21 0723  WBC 12.1* 10.7*  NEUTROABS 10.3*  --   HGB 16.1 12.5*  HCT 44.7 36.4*  MCV 86.5 86.1  PLT 163 109*   Cardiac Enzymes: Recent Labs  Lab 01/01/21 0723  CKTOTAL 173   BNP (last 3 results) No results for  input(s): PROBNP in the last 8760 hours. CBG: Recent Labs  Lab 01/01/21 0146 01/01/21 0636 01/01/21 0826 01/01/21 1025 01/01/21 1140  GLUCAP 96 165* 162* 141* 144*   D-Dimer: No results for input(s): DDIMER in the last 72 hours. Hgb A1c: No results for input(s): HGBA1C in the last 72 hours. Lipid Profile: No results for input(s): CHOL, HDL, LDLCALC, TRIG, CHOLHDL, LDLDIRECT in the last 72 hours. Thyroid function studies: No results for input(s): TSH, T4TOTAL, T3FREE, THYROIDAB in the last 72 hours.  Invalid input(s): FREET3 Anemia work up: No results for input(s): VITAMINB12, FOLATE, FERRITIN, TIBC, IRON, RETICCTPCT in the last 72 hours. Sepsis Labs: Recent Labs  Lab 12/31/20 0903 12/31/20 0906 01/01/21 0723  PROCALCITON  --  <0.10  --   WBC 12.1*  --  10.7*    Microbiology Recent Results (from the past 240 hour(s))  Resp Panel by RT-PCR (Flu A&B, Covid) Nasopharyngeal Swab     Status: None   Collection Time: 12/31/20  9:03 AM   Specimen: Nasopharyngeal Swab; Nasopharyngeal(NP) swabs in vial transport medium  Result Value Ref Range Status   SARS Coronavirus 2 by RT PCR NEGATIVE NEGATIVE Final    Comment: (NOTE) SARS-CoV-2 target nucleic acids are NOT DETECTED.  The SARS-CoV-2 RNA is generally detectable in upper respiratory specimens during the acute phase of infection. The lowest concentration of SARS-CoV-2 viral copies this assay can detect is 138 copies/mL. A negative result does not preclude SARS-Cov-2 infection and should not be used as the sole basis for treatment or other patient management decisions. A negative result may occur with  improper specimen collection/handling, submission of specimen other than  nasopharyngeal swab, presence of viral mutation(s) within the areas targeted by this assay, and inadequate number of viral copies(<138 copies/mL). A negative result must be combined with clinical observations, patient history, and epidemiological information. The expected result is Negative.  Fact Sheet for Patients:  EntrepreneurPulse.com.au  Fact Sheet for Healthcare Providers:  IncredibleEmployment.be  This test is no t yet approved or cleared by the [place] FDA and  has been authorized for detection and/or diagnosis of SARS-CoV-2 by FDA under an Emergency Use Authorization (EUA). This EUA will remain  in effect (meaning this test can be used) for the duration of the COVID-19 declaration under Section 564(b)(1) of the Act, 21 U.S.C.section 360bbb-3(b)(1), unless the authorization is terminated  or revoked sooner.       Influenza A by PCR NEGATIVE NEGATIVE Final   Influenza B by PCR NEGATIVE NEGATIVE Final    Comment: (NOTE) The Xpert Xpress SARS-CoV-2/FLU/RSV plus assay is intended as an aid in the diagnosis of influenza from Nasopharyngeal swab specimens and should not be used as a sole basis for treatment. Nasal washings and aspirates are unacceptable for Xpert Xpress SARS-CoV-2/FLU/RSV testing.  Fact Sheet for Patients: EntrepreneurPulse.com.au  Fact Sheet for Healthcare Providers: IncredibleEmployment.be  This test is not yet approved or cleared by the [place] FDA and has been authorized for detection and/or diagnosis of SARS-CoV-2 by FDA under an Emergency Use Authorization (EUA). This EUA will remain in effect (meaning this test can be used) for the duration of the COVID-19 declaration under Section 564(b)(1) of the Act, 21 U.S.C. section 360bbb-3(b)(1), unless the authorization is terminated or revoked.  Performed at [facility], [address] [place], [place]  [address]     Procedures and diagnostic studies:  DG Chest Portable 1 View  Result Date: 12/31/2020 CLINICAL DATA:  Shortness of breath, hypoglycemia EXAM: PORTABLE  CHEST 1 VIEW COMPARISON:  Chest radiograph 08/03/2020 FINDINGS: The cardiomediastinal silhouette is within normal limits. There are patchy opacities in the right base, new since the prior study. There is no other focal consolidation. There is no pulmonary edema. There is no pleural effusion or pneumothorax. There is no acute osseous abnormality. IMPRESSION: Patchy opacities in the right base suspicious for infection in the correct clinical setting Electronically Signed   By: Valetta Mole M.D.   On: 12/31/2020 09:43               LOS: 1 day   Besse Miron  [facility]   Pager on [email or web address]. If 7PM-7AM, please contact night-coverage at [email or web address]     01/01/2021, 12:48 PM

## 2021-01-01 NOTE — ED Notes (Signed)
CBG 144 

## 2021-01-02 ENCOUNTER — Inpatient Hospital Stay: Payer: Medicare Other

## 2021-01-02 DIAGNOSIS — J9601 Acute respiratory failure with hypoxia: Secondary | ICD-10-CM | POA: Diagnosis not present

## 2021-01-02 DIAGNOSIS — J441 Chronic obstructive pulmonary disease with (acute) exacerbation: Secondary | ICD-10-CM | POA: Diagnosis not present

## 2021-01-02 DIAGNOSIS — G9341 Metabolic encephalopathy: Secondary | ICD-10-CM | POA: Diagnosis not present

## 2021-01-02 DIAGNOSIS — J690 Pneumonitis due to inhalation of food and vomit: Secondary | ICD-10-CM | POA: Diagnosis not present

## 2021-01-02 LAB — BASIC METABOLIC PANEL
Anion gap: 10 (ref 5–15)
BUN: 13 mg/dL (ref 8–23)
CO2: 28 mmol/L (ref 22–32)
Calcium: 9 mg/dL (ref 8.9–10.3)
Chloride: 104 mmol/L (ref 98–111)
Creatinine, Ser: 0.93 mg/dL (ref 0.61–1.24)
GFR, Estimated: >60 mL/min (ref >60)
Glucose, Bld: 120 mg/dL — ABNORMAL HIGH (ref 70–99)
Potassium: 3.5 mmol/L (ref 3.5–5.1)
Sodium: 142 mmol/L (ref 135–145)

## 2021-01-02 LAB — CBC WITH DIFFERENTIAL/PLATELET
Abs Immature Granulocytes: 0.04 10*3/uL (ref 0.00–0.07)
Basophils Absolute: 0 10*3/uL (ref 0.0–0.1)
Basophils Relative: 0 %
Eosinophils Absolute: 0 10*3/uL (ref 0.0–0.5)
Eosinophils Relative: 0 %
HCT: 41 % (ref 39.0–52.0)
Hemoglobin: 14.5 g/dL (ref 13.0–17.0)
Immature Granulocytes: 0 %
Lymphocytes Relative: 15 %
Lymphs Abs: 1.5 10*3/uL (ref 0.7–4.0)
MCH: 30.7 pg (ref 26.0–34.0)
MCHC: 35.4 g/dL (ref 30.0–36.0)
MCV: 86.7 fL (ref 80.0–100.0)
Monocytes Absolute: 0.8 10*3/uL (ref 0.1–1.0)
Monocytes Relative: 8 %
Neutro Abs: 7.8 10*3/uL — ABNORMAL HIGH (ref 1.7–7.7)
Neutrophils Relative %: 77 %
Platelets: 129 10*3/uL — ABNORMAL LOW (ref 150–400)
RBC: 4.73 MIL/uL (ref 4.22–5.81)
RDW: 13.9 % (ref 11.5–15.5)
WBC: 10.3 10*3/uL (ref 4.0–10.5)
nRBC: 0 % (ref 0.0–0.2)

## 2021-01-02 LAB — GLUCOSE, CAPILLARY
Glucose-Capillary: 111 mg/dL — ABNORMAL HIGH (ref 70–99)
Glucose-Capillary: 138 mg/dL — ABNORMAL HIGH (ref 70–99)
Glucose-Capillary: 132 mg/dL — ABNORMAL HIGH (ref 70–99)
Glucose-Capillary: 153 mg/dL — ABNORMAL HIGH (ref 70–99)

## 2021-01-02 MED ORDER — HALOPERIDOL LACTATE 5 MG/ML IJ SOLN
5.0000 mg | Freq: Four times a day (QID) | INTRAMUSCULAR | Status: DC | PRN
  Administered 2021-01-02 – 2021-01-16 (×10): 5 mg via INTRAMUSCULAR
  Filled 2021-01-02 (×10): qty 1

## 2021-01-02 MED ORDER — DEXTROSE IN LACTATED RINGERS 5 % IV SOLN
INTRAVENOUS | Status: DC
  Filled 2021-01-02 (×2): qty 1000

## 2021-01-02 MED ORDER — POTASSIUM CHLORIDE 2 MEQ/ML IV SOLN: INTRAVENOUS | Status: DC

## 2021-01-02 MED ORDER — INFLUENZA VAC A&B SA ADJ QUAD 0.5 ML IM PRSY
0.5000 mL | PREFILLED_SYRINGE | INTRAMUSCULAR | Status: AC
  Administered 2021-01-05: 0.5 mL via INTRAMUSCULAR
  Filled 2021-01-02: qty 0.5

## 2021-01-02 MED ORDER — ACETAMINOPHEN 650 MG RE SUPP
650.0000 mg | RECTAL | Status: DC | PRN
  Filled 2021-01-02: qty 1

## 2021-01-02 MED ORDER — HALOPERIDOL 1 MG PO TABS
1.0000 mg | ORAL_TABLET | Freq: Two times a day (BID) | ORAL | Status: DC
  Filled 2021-01-02 (×2): qty 1

## 2021-01-02 MED ORDER — ASPIRIN 300 MG RE SUPP
300.0000 mg | Freq: Every day | RECTAL | Status: DC
  Administered 2021-01-03 – 2021-01-11 (×9): 300 mg via RECTAL
  Filled 2021-01-02 (×12): qty 1

## 2021-01-02 NOTE — Progress Notes (Signed)
Had patient in chair for 1.5 hours this evening. Then, walked patient 75 ft in the hallway with walker. Patient returned to bed.

## 2021-01-02 NOTE — Progress Notes (Addendum)
Progress Note    Todd Davidson  IFB:[id number] DOB: [birth year]  DOA: 12/31/2020 PCP: Perrin Maltese, MD      Brief Narrative:    Medical records reviewed and are as summarized below:  Todd Davidson is a 85 y.o. male with medical history significant for asthma/COPD, insulin-dependent diabetes mellitus, history of prostate cancer, dementia with behavioral disturbance, hypertension, who was brought to the hospital because of acute change in mental status/lethargy.  His blood sugar at home was 34.  He was also noted to have oxygen saturation of 80% associated with use of accessory respiratory muscles.  He was placed on 15 L/min oxygen via nonrebreather mask and transported to the emergency room.   He was admitted to the hospital for hypoglycemia, acute hypoxemic respiratory failure secondary to aspiration pneumonia and COPD exacerbation.  He was treated with BiPAP initially.  He was also treated with IV dextrose, IV antibiotics and bronchodilators.       Assessment/Plan:   Principal Problem:   Acute metabolic encephalopathy Active Problems:   Diabetes mellitus with hypoglycemia, with long-term current use of insulin (HCC)   Acute respiratory failure (HCC)   Aspiration pneumonia (HCC)   COPD with acute exacerbation (HCC)   Dementia (HCC)    Body mass index is 20.54 kg/m.  Insulin-dependent diabetes mellitus, s/p hypoglycemia:   He takes Lantus 16 units daily at home.  This has been discontinued because of recurrent hypoglycemia.  Use NovoLog as needed for hyperglycemia.   Acute metabolic encephalopathy, delirium superimposed on dementia with behavioral disturbance: His brother is concerned that he may be having a stroke.  CT head has been ordered for further evaluation.  Haldol as needed for agitation.  Aspiration pneumonia: Continue empiric IV Unasyn.    Dysphagia: Speech therapist recommended dysphagia 1 diet.  However, patient continues to have frequent coughs  when he eats.  Keep n.p.o. for now.  Start IV fluids for hydration.  COPD exacerbation, acute hypoxemic respiratory failure: He is off of BiPAP.  He is tolerating 2 L/min oxygen via nasal cannula.  Taper off oxygen as able.  Continue bronchodilators.  Hypokalemia: Continue potassium repletion and monitor levels.  Tremors of extremities: Chart review showed that he has had tremors of the extremities since 2016.  Other comorbidities include history of left renal cell cancer s/p cryoablation, history of prostate cancer, asthma hypertension  Plan discussed with the patient's brother and private caregiver at the bedside. Plan discussed with Apolonio Schneiders, RN  Diet Order             Diet NPO time specified  Diet effective now                      Consultants: None  Procedures: None    Medications:    aspirin  300 mg Rectal Daily   budesonide (PULMICORT) nebulizer solution  0.25 mg Nebulization BID   enoxaparin (LOVENOX) injection  40 mg Subcutaneous Q24H   Continuous Infusions:  ampicillin-sulbactam (UNASYN) IV 3 g (01/02/21 0938)   dextrose 5 % lactated ringers with kcl 50 mL/hr at 01/02/21 1250     Anti-infectives (From admission, onward)    Start     Dose/Rate Route Frequency Ordered Stop   01/01/21 1000  Ampicillin-Sulbactam (UNASYN) 3 g in sodium chloride 0.9 % 100 mL IVPB        3 g 200 mL/hr over 30 Minutes Intravenous Every 6 hours 01/01/21 0924     12/31/20  1630  Ampicillin-Sulbactam (UNASYN) 3 g in sodium chloride 0.9 % 100 mL IVPB        3 g 200 mL/hr over 30 Minutes Intravenous  Once 12/31/20 1116 12/31/20 1709   12/31/20 1015  Ampicillin-Sulbactam (UNASYN) 3 g in sodium chloride 0.9 % 100 mL IVPB        3 g 200 mL/hr over 30 Minutes Intravenous  Once 12/31/20 1003 12/31/20 1059              Family Communication/Anticipated D/C date and plan/Code Status   DVT prophylaxis: enoxaparin (LOVENOX) injection 40 mg Start: 12/31/20 1300     Code  Status: DNR  Family Communication: Plan discussed with his brother at the bedside Disposition Plan: Plan to discharge home in 2 to 3 days   Status is: Inpatient  Remains inpatient appropriate because: Requiring IV antibiotics           Subjective:   Interval events noted.  Reportedly, patient was confused and agitated overnight.  His brother and private caregiver were at the bedside.  His brother was concerned about tremors of the right upper extremity.  Initially, he said that he had just started but later he said he has been going on for months and that he is supposed to see a neurologist in November for further evaluation.  His caregiver also reported that patient has been coughing when he eats at home.  Objective:    Vitals:   01/01/21 2054 01/02/21 0403 01/02/21 0826 01/02/21 0827  BP: (!) 164/65 (!) 154/82 (!) 152/75 (!) 152/75  Pulse: (!) 51 80 (!) 50 62  Resp: 18 20 12 16   Temp: 98.4 F (36.9 C) 99.5 F (37.5 C)  98.4 F (36.9 C)  TempSrc: Oral Oral  Oral  SpO2: 96% 92%  98%  Weight:      Height:       No data found.   Intake/Output Summary (Last 24 hours) at 01/02/2021 1436 Last data filed at 01/02/2021 0543 Gross per 24 hour  Intake 20 ml  Output 1900 ml  Net -1880 ml   Filed Weights   12/31/20 0907  Weight: 72.6 kg    Exam:  GEN: NAD SKIN: Warm and dry EYES: EOMI ENT: MMM CV: RRR PULM: CTA B ABD: soft, ND, NT, +BS CNS: Alert but confused.  Nonfocal, tremor of right upper extremity EXT: No edema or tenderness  Patient     Data Reviewed:   I have personally reviewed following labs and imaging studies:  Labs: Labs show the following:   Basic Metabolic Panel: Recent Labs  Lab 12/31/20 0903 12/31/20 1521 01/01/21 0723 01/02/21 0451  NA 140  --  138 142  K 3.0*  --  3.3* 3.5  CL 104  --  104 104  CO2 26  --  26 28  GLUCOSE 143*  --  169* 120*  BUN 10  --  10 13  CREATININE 0.81  --  0.89 0.93  CALCIUM 9.0  --  8.5* 9.0   MG  --  1.7  --   --    GFR Estimated Creatinine Clearance: 62.9 mL/min (by C-G formula based on SCr of 0.93 mg/dL). Liver Function Tests: Recent Labs  Lab 12/31/20 0903  AST 33  ALT 16  ALKPHOS 74  BILITOT 0.8  PROT 7.5  ALBUMIN 4.0   No results for input(s): LIPASE, AMYLASE in the last 168 hours. No results for input(s): AMMONIA in the last 168 hours. Coagulation profile  No results for input(s): INR, PROTIME in the last 168 hours.  CBC: Recent Labs  Lab 12/31/20 0903 01/01/21 0723 01/02/21 0451  WBC 12.1* 10.7* 10.3  NEUTROABS 10.3*  --  7.8*  HGB 16.1 12.5* 14.5  HCT 44.7 36.4* 41.0  MCV 86.5 86.1 86.7  PLT 163 109* 129*   Cardiac Enzymes: Recent Labs  Lab 01/01/21 0723  CKTOTAL 173   BNP (last 3 results) No results for input(s): PROBNP in the last 8760 hours. CBG: Recent Labs  Lab 01/01/21 1140 01/01/21 1640 01/01/21 2104 01/02/21 0823 01/02/21 1220  GLUCAP 144* 119* 169* 111* 138*   D-Dimer: No results for input(s): DDIMER in the last 72 hours. Hgb A1c: No results for input(s): HGBA1C in the last 72 hours. Lipid Profile: No results for input(s): CHOL, HDL, LDLCALC, TRIG, CHOLHDL, LDLDIRECT in the last 72 hours. Thyroid function studies: No results for input(s): TSH, T4TOTAL, T3FREE, THYROIDAB in the last 72 hours.  Invalid input(s): FREET3 Anemia work up: No results for input(s): VITAMINB12, FOLATE, FERRITIN, TIBC, IRON, RETICCTPCT in the last 72 hours. Sepsis Labs: Recent Labs  Lab 12/31/20 0903 12/31/20 0906 01/01/21 0723 01/02/21 0451  PROCALCITON  --  <0.10  --   --   WBC 12.1*  --  10.7* 10.3    Microbiology Recent Results (from the past 240 hour(s))  Resp Panel by RT-PCR (Flu A&B, Covid) Nasopharyngeal Swab     Status: None   Collection Time: 12/31/20  9:03 AM   Specimen: Nasopharyngeal Swab; Nasopharyngeal(NP) swabs in vial transport medium  Result Value Ref Range Status   SARS Coronavirus 2 by RT PCR NEGATIVE NEGATIVE  Final    Comment: (NOTE) SARS-CoV-2 target nucleic acids are NOT DETECTED.  The SARS-CoV-2 RNA is generally detectable in upper respiratory specimens during the acute phase of infection. The lowest concentration of SARS-CoV-2 viral copies this assay can detect is 138 copies/mL. A negative result does not preclude SARS-Cov-2 infection and should not be used as the sole basis for treatment or other patient management decisions. A negative result may occur with  improper specimen collection/handling, submission of specimen other than nasopharyngeal swab, presence of viral mutation(s) within the areas targeted by this assay, and inadequate number of viral copies(<138 copies/mL). A negative result must be combined with clinical observations, patient history, and epidemiological information. The expected result is Negative.  Fact Sheet for Patients:  EntrepreneurPulse.com.au  Fact Sheet for Healthcare Providers:  IncredibleEmployment.be  This test is no t yet approved or cleared by the [place] FDA and  has been authorized for detection and/or diagnosis of SARS-CoV-2 by FDA under an Emergency Use Authorization (EUA). This EUA will remain  in effect (meaning this test can be used) for the duration of the COVID-19 declaration under Section 564(b)(1) of the Act, 21 U.S.C.section 360bbb-3(b)(1), unless the authorization is terminated  or revoked sooner.       Influenza A by PCR NEGATIVE NEGATIVE Final   Influenza B by PCR NEGATIVE NEGATIVE Final    Comment: (NOTE) The Xpert Xpress SARS-CoV-2/FLU/RSV plus assay is intended as an aid in the diagnosis of influenza from Nasopharyngeal swab specimens and should not be used as a sole basis for treatment. Nasal washings and aspirates are unacceptable for Xpert Xpress SARS-CoV-2/FLU/RSV testing.  Fact Sheet for Patients: EntrepreneurPulse.com.au  Fact Sheet for Healthcare  Providers: IncredibleEmployment.be  This test is not yet approved or cleared by the [place] FDA and has been authorized for detection and/or diagnosis of SARS-CoV-2 by  FDA under an Emergency Use Authorization (EUA). This EUA will remain in effect (meaning this test can be used) for the duration of the COVID-19 declaration under Section 564(b)(1) of the Act, 21 U.S.C. section 360bbb-3(b)(1), unless the authorization is terminated or revoked.  Performed at [facility], [address] [place], [place] [address]     Procedures and diagnostic studies:  No results found.             LOS: 2 days   Taino Maertens  [facility]   Pager on [email or web address]. If 7PM-7AM, please contact night-coverage at [email or web address]     01/02/2021, 2:36 PM

## 2021-01-03 DIAGNOSIS — J690 Pneumonitis due to inhalation of food and vomit: Secondary | ICD-10-CM | POA: Diagnosis not present

## 2021-01-03 DIAGNOSIS — G9341 Metabolic encephalopathy: Secondary | ICD-10-CM | POA: Diagnosis not present

## 2021-01-03 DIAGNOSIS — J441 Chronic obstructive pulmonary disease with (acute) exacerbation: Secondary | ICD-10-CM | POA: Diagnosis not present

## 2021-01-03 LAB — CBC WITH DIFFERENTIAL/PLATELET
Abs Immature Granulocytes: 0.04 10*3/uL (ref 0.00–0.07)
Basophils Absolute: 0 10*3/uL (ref 0.0–0.1)
Basophils Relative: 0 %
Eosinophils Absolute: 0 10*3/uL (ref 0.0–0.5)
Eosinophils Relative: 0 %
HCT: 42.4 % (ref 39.0–52.0)
Hemoglobin: 14.5 g/dL (ref 13.0–17.0)
Immature Granulocytes: 1 %
Lymphocytes Relative: 19 %
Lymphs Abs: 1.7 10*3/uL (ref 0.7–4.0)
MCH: 29.8 pg (ref 26.0–34.0)
MCHC: 34.2 g/dL (ref 30.0–36.0)
MCV: 87.2 fL (ref 80.0–100.0)
Monocytes Absolute: 0.7 10*3/uL (ref 0.1–1.0)
Monocytes Relative: 8 %
Neutro Abs: 6.4 10*3/uL (ref 1.7–7.7)
Neutrophils Relative %: 72 %
Platelets: 126 10*3/uL — ABNORMAL LOW (ref 150–400)
RBC: 4.86 MIL/uL (ref 4.22–5.81)
RDW: 13.7 % (ref 11.5–15.5)
WBC: 8.8 10*3/uL (ref 4.0–10.5)
nRBC: 0 % (ref 0.0–0.2)

## 2021-01-03 LAB — GLUCOSE, CAPILLARY
Glucose-Capillary: 152 mg/dL — ABNORMAL HIGH (ref 70–99)
Glucose-Capillary: 185 mg/dL — ABNORMAL HIGH (ref 70–99)
Glucose-Capillary: 152 mg/dL — ABNORMAL HIGH (ref 70–99)
Glucose-Capillary: 161 mg/dL — ABNORMAL HIGH (ref 70–99)

## 2021-01-03 LAB — BASIC METABOLIC PANEL
Anion gap: 11 (ref 5–15)
BUN: 14 mg/dL (ref 8–23)
CO2: 28 mmol/L (ref 22–32)
Calcium: 9 mg/dL (ref 8.9–10.3)
Chloride: 103 mmol/L (ref 98–111)
Creatinine, Ser: 0.87 mg/dL (ref 0.61–1.24)
GFR, Estimated: >60 mL/min (ref >60)
Glucose, Bld: 138 mg/dL — ABNORMAL HIGH (ref 70–99)
Potassium: 3.2 mmol/L — ABNORMAL LOW (ref 3.5–5.1)
Sodium: 142 mmol/L (ref 135–145)

## 2021-01-03 LAB — MAGNESIUM: Magnesium: 1.9 mg/dL (ref 1.7–2.4)

## 2021-01-03 MED ORDER — SODIUM CHLORIDE 0.45 % IV SOLN
INTRAVENOUS | Status: DC
  Filled 2021-01-03 (×4): qty 1000

## 2021-01-03 MED ORDER — HYDRALAZINE HCL 20 MG/ML IJ SOLN
10.0000 mg | Freq: Four times a day (QID) | INTRAMUSCULAR | Status: DC | PRN
  Administered 2021-01-04 – 2021-01-05 (×2): 10 mg via INTRAVENOUS
  Filled 2021-01-03 (×2): qty 1

## 2021-01-03 MED ORDER — POTASSIUM CHLORIDE 10 MEQ/100ML IV SOLN
10.0000 meq | INTRAVENOUS | Status: AC
Start: 2021-01-03 — End: 2021-01-03
  Administered 2021-01-03 (×3): 10 meq via INTRAVENOUS
  Filled 2021-01-03 (×3): qty 100

## 2021-01-03 MED ORDER — HYDRALAZINE HCL 20 MG/ML IJ SOLN
10.0000 mg | INTRAMUSCULAR | Status: AC | PRN
  Administered 2021-01-03: 10 mg via INTRAVENOUS
  Filled 2021-01-03: qty 1

## 2021-01-03 NOTE — Progress Notes (Addendum)
Progress Note    Todd Davidson  XKG:[id number] DOB: 01-30-1936  DOA: 12/31/2020 PCP: Perrin Maltese, MD      Brief Narrative:    Medical records reviewed and are as summarized below:  Todd Davidson is a 85 y.o. male with medical history significant for asthma/COPD, insulin-dependent diabetes mellitus, history of prostate cancer, dementia with behavioral disturbance, hypertension, who was brought to the hospital because of acute change in mental status/lethargy.  His blood sugar at home was 34.  He was also noted to have oxygen saturation of 80% associated with use of accessory respiratory muscles.  He was placed on 15 L/min oxygen via nonrebreather mask and transported to the emergency room.   He was admitted to the hospital for hypoglycemia, acute hypoxemic respiratory failure secondary to aspiration pneumonia and COPD exacerbation.  He was treated with BiPAP initially.  He was also treated with IV dextrose, IV antibiotics and bronchodilators.       Assessment/Plan:   Principal Problem:   Acute metabolic encephalopathy Active Problems:   Diabetes mellitus with hypoglycemia, with long-term current use of insulin (HCC)   Acute respiratory failure (HCC)   Aspiration pneumonia (HCC)   COPD with acute exacerbation (HCC)   Dementia (HCC)    Body mass index is 19.16 kg/m.  Insulin-dependent diabetes mellitus, s/p hypoglycemia:   He takes Lantus 16 units daily at home.  This has been discontinued because of recurrent hypoglycemia.  Glucose levels have been stable.  Use NovoLog as needed for hyperglycemia.   Acute metabolic encephalopathy, delirium superimposed on dementia with behavioral disturbance: CT head did not show any acute stroke.  Haldol as needed for agitation.  Aspiration pneumonia: Continue empiric IV Unasyn.   Arrhythmia: Telemetry showed a lot of artifacts likely from tremors.  Twelve-lead EKG showed mild sinus tachycardia.  Discontinue  telemetry.  Dysphagia: Speech therapist recommended dysphagia 1 diet.  However, patient continues to have frequent coughs when he eats.  Keep n.p.o. for now.  Continue IV fluids for hydration.  COPD exacerbation, acute hypoxemic respiratory failure: Improved.  He is tolerating room air.  Continue bronchodilators.  Hypokalemia: Replete potassium and monitor levels.  Tremors of extremities: Chart review showed that he has had tremors of the extremities since 2016.  Other comorbidities include history of left renal cell cancer s/p cryoablation, history of prostate cancer, asthma hypertension  Plan discussed with patient's brother and private caregiver at the bedside.   Diet Order             Diet NPO time specified  Diet effective now                      Consultants: None  Procedures: None    Medications:    aspirin  300 mg Rectal Daily   budesonide (PULMICORT) nebulizer solution  0.25 mg Nebulization BID   enoxaparin (LOVENOX) injection  40 mg Subcutaneous Q24H   influenza vaccine adjuvanted  0.5 mL Intramuscular Tomorrow-1000   Continuous Infusions:  ampicillin-sulbactam (UNASYN) IV 3 g (01/03/21 1507)   sodium chloride 0.45 % with kcl 50 mL/hr at 01/03/21 1400     Anti-infectives (From admission, onward)    Start     Dose/Rate Route Frequency Ordered Stop   01/01/21 1000  Ampicillin-Sulbactam (UNASYN) 3 g in sodium chloride 0.9 % 100 mL IVPB        3 g 200 mL/hr over 30 Minutes Intravenous Every 6 hours 01/01/21 0924  12/31/20 1630  Ampicillin-Sulbactam (UNASYN) 3 g in sodium chloride 0.9 % 100 mL IVPB        3 g 200 mL/hr over 30 Minutes Intravenous  Once 12/31/20 1116 12/31/20 1709   12/31/20 1015  Ampicillin-Sulbactam (UNASYN) 3 g in sodium chloride 0.9 % 100 mL IVPB        3 g 200 mL/hr over 30 Minutes Intravenous  Once 12/31/20 1003 12/31/20 1059              Family Communication/Anticipated D/C date and plan/Code Status   DVT  prophylaxis: enoxaparin (LOVENOX) injection 40 mg Start: 12/31/20 1300     Code Status: DNR  Family Communication: Plan discussed with his brother at the bedside Disposition Plan: Plan to discharge home in 2 to 3 days   Status is: Inpatient  Remains inpatient appropriate because: Requiring IV antibiotics           Subjective:   Interval events noted.  Patient has been having tremors of right upper extremity.  Telemetry has been going off because of arrhythmia/tachycardia probably related to tremors.  Objective:    Vitals:   01/03/21 0623 01/03/21 0741 01/03/21 0821 01/03/21 1551  BP: (!) 186/98 (!) 135/47  (!) 183/111  Pulse: 88 67  86  Resp: 16 16  18   Temp:  98 F (36.7 C)  98.1 F (36.7 C)  TempSrc:  Oral  Oral  SpO2: 93% 93% 94% 96%  Weight: 67.7 kg     Height:       No data found.   Intake/Output Summary (Last 24 hours) at 01/03/2021 1724 Last data filed at 01/03/2021 1700 Gross per 24 hour  Intake 508.07 ml  Output 700 ml  Net -191.93 ml   Filed Weights   12/31/20 0907 01/03/21 0623  Weight: 72.6 kg 67.7 kg    Exam:  GEN: NAD SKIN: Warm and dry EYES: No pallor or icterus ENT: MMM CV: RRR PULM: CTA B ABD: soft, ND, NT, +BS CNS: AAO x 1 (person), non focal.  Tremors of right upper extremity. EXT: No edema or tenderness      Data Reviewed:   I have personally reviewed following labs and imaging studies:  Labs: Labs show the following:   Basic Metabolic Panel: Recent Labs  Lab 12/31/20 0903 12/31/20 1521 01/01/21 0723 01/02/21 0451 01/03/21 0528  NA 140  --  138 142 142  K 3.0*  --  3.3* 3.5 3.2*  CL 104  --  104 104 103  CO2 26  --  26 28 28   GLUCOSE 143*  --  169* 120* 138*  BUN 10  --  10 13 14   CREATININE 0.81  --  0.89 0.93 0.87  CALCIUM 9.0  --  8.5* 9.0 9.0  MG  --  1.7  --   --  1.9   GFR Estimated Creatinine Clearance: 62.7 mL/min (by C-G formula based on SCr of 0.87 mg/dL). Liver Function Tests: Recent  Labs  Lab 12/31/20 0903  AST 33  ALT 16  ALKPHOS 74  BILITOT 0.8  PROT 7.5  ALBUMIN 4.0   No results for input(s): LIPASE, AMYLASE in the last 168 hours. No results for input(s): AMMONIA in the last 168 hours. Coagulation profile No results for input(s): INR, PROTIME in the last 168 hours.  CBC: Recent Labs  Lab 12/31/20 0903 01/01/21 0723 01/02/21 0451 01/03/21 0528  WBC 12.1* 10.7* 10.3 8.8  NEUTROABS 10.3*  --  7.8* 6.4  HGB  16.1 12.5* 14.5 14.5  HCT 44.7 36.4* 41.0 42.4  MCV 86.5 86.1 86.7 87.2  PLT 163 109* 129* 126*   Cardiac Enzymes: Recent Labs  Lab 01/01/21 0723  CKTOTAL 173   BNP (last 3 results) No results for input(s): PROBNP in the last 8760 hours. CBG: Recent Labs  Lab 01/02/21 1719 01/02/21 2138 01/03/21 0811 01/03/21 1138 01/03/21 1653  GLUCAP 132* 153* 152* 185* 152*   D-Dimer: No results for input(s): DDIMER in the last 72 hours. Hgb A1c: No results for input(s): HGBA1C in the last 72 hours. Lipid Profile: No results for input(s): CHOL, HDL, LDLCALC, TRIG, CHOLHDL, LDLDIRECT in the last 72 hours. Thyroid function studies: No results for input(s): TSH, T4TOTAL, T3FREE, THYROIDAB in the last 72 hours.  Invalid input(s): FREET3 Anemia work up: No results for input(s): VITAMINB12, FOLATE, FERRITIN, TIBC, IRON, RETICCTPCT in the last 72 hours. Sepsis Labs: Recent Labs  Lab 12/31/20 0903 12/31/20 0906 01/01/21 0723 01/02/21 0451 01/03/21 0528  PROCALCITON  --  <0.10  --   --   --   WBC 12.1*  --  10.7* 10.3 8.8    Microbiology Recent Results (from the past 240 hour(s))  Resp Panel by RT-PCR (Flu A&B, Covid) Nasopharyngeal Swab     Status: None   Collection Time: 12/31/20  9:03 AM   Specimen: Nasopharyngeal Swab; Nasopharyngeal(NP) swabs in vial transport medium  Result Value Ref Range Status   SARS Coronavirus 2 by RT PCR NEGATIVE NEGATIVE Final    Comment: (NOTE) SARS-CoV-2 target nucleic acids are NOT DETECTED.  The  SARS-CoV-2 RNA is generally detectable in upper respiratory specimens during the acute phase of infection. The lowest concentration of SARS-CoV-2 viral copies this assay can detect is 138 copies/mL. A negative result does not preclude SARS-Cov-2 infection and should not be used as the sole basis for treatment or other patient management decisions. A negative result may occur with  improper specimen collection/handling, submission of specimen other than nasopharyngeal swab, presence of viral mutation(s) within the areas targeted by this assay, and inadequate number of viral copies(<138 copies/mL). A negative result must be combined with clinical observations, patient history, and epidemiological information. The expected result is Negative.  Fact Sheet for Patients:  EntrepreneurPulse.com.au  Fact Sheet for Healthcare Providers:  IncredibleEmployment.be  This test is no t yet approved or cleared by the [place] FDA and  has been authorized for detection and/or diagnosis of SARS-CoV-2 by FDA under an Emergency Use Authorization (EUA). This EUA will remain  in effect (meaning this test can be used) for the duration of the COVID-19 declaration under Section 564(b)(1) of the Act, 21 U.S.C.section 360bbb-3(b)(1), unless the authorization is terminated  or revoked sooner.       Influenza A by PCR NEGATIVE NEGATIVE Final   Influenza B by PCR NEGATIVE NEGATIVE Final    Comment: (NOTE) The Xpert Xpress SARS-CoV-2/FLU/RSV plus assay is intended as an aid in the diagnosis of influenza from Nasopharyngeal swab specimens and should not be used as a sole basis for treatment. Nasal washings and aspirates are unacceptable for Xpert Xpress SARS-CoV-2/FLU/RSV testing.  Fact Sheet for Patients: EntrepreneurPulse.com.au  Fact Sheet for Healthcare Providers: IncredibleEmployment.be  This test is not yet approved or  cleared by the [place] FDA and has been authorized for detection and/or diagnosis of SARS-CoV-2 by FDA under an Emergency Use Authorization (EUA). This EUA will remain in effect (meaning this test can be used) for the duration of the COVID-19 declaration under  Section 564(b)(1) of the Act, 21 U.S.C. section 360bbb-3(b)(1), unless the authorization is terminated or revoked.  Performed at [facility], [address] [place], [place] [address]     Procedures and diagnostic studies:  CT HEAD WO CONTRAST (5MM)  Result Date: 01/02/2021 CLINICAL DATA:  Delirium EXAM: CT HEAD WITHOUT CONTRAST TECHNIQUE: Contiguous axial images were obtained from the base of the skull through the vertex without intravenous contrast. COMPARISON:  Aug 01, 2019. FINDINGS: Brain: No evidence of acute large vascular territory infarction, hemorrhage, hydrocephalus, extra-axial collection or mass lesion/mass effect. 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 Similar additional mild patchy white matter hypodensities, nonspecific but compatible with chronic microvascular ischemic disease. Similar mild generalized atrophy with ex vacuo ventricular dilation. Vascular: No hyperdense vessel identified. Calcific intracranial atherosclerosis. Skull: No acute fracture. Sinuses/Orbits: Mild paranasal sinus mucosal thickening with probable prior endoscopic surgery. No acute orbital findings. Other: No mastoid effusions. IMPRESSION: 1. No evidence of acute intracranial abnormality. 2. 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 3. Similar mild for age chronic microvascular ischemic disease and atrophy. Electronically Signed   By: Margaretha Sheffield M.D.   On: 01/02/2021 15:13               LOS: 3 days   Todd Davidson  [facility]   Pager on [email or web address]. If 7PM-7AM, please  contact night-coverage at [email or web address]     01/03/2021, 5:24 PM

## 2021-01-03 NOTE — Progress Notes (Signed)
Paged Hospitalist re: BP 186/98

## 2021-01-04 DIAGNOSIS — Z794 Long term (current) use of insulin: Secondary | ICD-10-CM

## 2021-01-04 DIAGNOSIS — J441 Chronic obstructive pulmonary disease with (acute) exacerbation: Secondary | ICD-10-CM | POA: Diagnosis not present

## 2021-01-04 DIAGNOSIS — E11649 Type 2 diabetes mellitus with hypoglycemia without coma: Secondary | ICD-10-CM | POA: Diagnosis not present

## 2021-01-04 DIAGNOSIS — G9341 Metabolic encephalopathy: Secondary | ICD-10-CM | POA: Diagnosis not present

## 2021-01-04 DIAGNOSIS — J690 Pneumonitis due to inhalation of food and vomit: Secondary | ICD-10-CM | POA: Diagnosis not present

## 2021-01-04 LAB — BASIC METABOLIC PANEL
Anion gap: 16 — ABNORMAL HIGH (ref 5–15)
BUN: 14 mg/dL (ref 8–23)
CO2: 23 mmol/L (ref 22–32)
Calcium: 9.1 mg/dL (ref 8.9–10.3)
Chloride: 105 mmol/L (ref 98–111)
Creatinine, Ser: 0.94 mg/dL (ref 0.61–1.24)
GFR, Estimated: >60 mL/min (ref >60)
Glucose, Bld: 145 mg/dL — ABNORMAL HIGH (ref 70–99)
Potassium: 3.6 mmol/L (ref 3.5–5.1)
Sodium: 144 mmol/L (ref 135–145)

## 2021-01-04 LAB — GLUCOSE, CAPILLARY
Glucose-Capillary: 139 mg/dL — ABNORMAL HIGH (ref 70–99)
Glucose-Capillary: 160 mg/dL — ABNORMAL HIGH (ref 70–99)
Glucose-Capillary: 174 mg/dL — ABNORMAL HIGH (ref 70–99)
Glucose-Capillary: 164 mg/dL — ABNORMAL HIGH (ref 70–99)

## 2021-01-04 LAB — MAGNESIUM: Magnesium: 1.8 mg/dL (ref 1.7–2.4)

## 2021-01-04 NOTE — Evaluation (Signed)
Occupational Therapy Evaluation Patient Details Name: Todd Davidson MRN: [id number] DOB: [birth year] Today's Date: 01/04/2021   History of Present Illness 85 y.o. male with medical history significant for asthma/COPD, insulin-dependent diabetes mellitus, history of prostate cancer, dementia with behavioral disturbance, hypertension, who was brought to the hospital because of acute change in mental status/lethargy.   Clinical Impression   Patient presenting with decreased Ind in self care, balance, functional mobility/transfers, endurance, and safety awareness. Patient's brother and caregiver present during session report that pt is able to dress himself and ambulates with RW. Pt has 24/7 caregivers at home. Caregiver present while pt ambulates and reports his movement as very close to baseline. Pt currently needing min A to stand from EOB secondary to R knee fusion and needing a boost. Pt ambulating with RW and close supervision with min cuing for safety with RW. Pt does fatigue quickly during session but follows commands with increased time. He has an intention tremor in R UE. Pt would not qualify for SNF secondary to being very close to baseline with 24/7 caregivers at home. OT recommending [facility] at discharge. Patient will benefit from acute OT to increase overall independence in the areas of ADLs, functional mobility, and safety awareness in order to safely discharge home.      Recommendations for follow up therapy are one component of a multi-disciplinary discharge planning process, led by the attending physician.  Recommendations may be updated based on patient status, additional functional criteria and insurance authorization.   Follow Up Recommendations  Home health OT;Supervision/Assistance - 24 hour    Equipment Recommendations  None recommended by OT       Precautions / Restrictions Precautions Precautions: Fall      Mobility Bed Mobility Overal bed mobility: Needs Assistance Bed  Mobility: Supine to Sit     Supine to sit: Min guard     General bed mobility comments: cuing for technique    Transfers Overall transfer level: Needs assistance   Transfers: Sit to/from Stand;Stand Pivot Transfers Sit to Stand: Min guard;Min assist Stand pivot transfers: Supervision            Balance Overall balance assessment: Needs assistance Sitting-balance support: Feet supported Sitting balance-Leahy Scale: Good     Standing balance support: During functional activity Standing balance-Leahy Scale: Fair Standing balance comment: RW for support                           ADL either performed or assessed with clinical judgement   ADL Overall ADL's : Needs assistance/impaired     Grooming: Wash/dry hands;Wash/dry face;Sitting;Supervision/safety                   Toilet Transfer: Supervision/safety;Ambulation;RW Toilet Transfer Details (indicate cue type and reason): simulated                 Vision Patient Visual Report: No change from baseline              Pertinent Vitals/Pain Pain Assessment: No/denies pain     Hand Dominance Right   Extremity/Trunk Assessment Upper Extremity Assessment Upper Extremity Assessment: Overall WFL for tasks assessed   Lower Extremity Assessment Lower Extremity Assessment: Overall WFL for tasks assessed       Communication Communication Communication: HOH   Cognition Arousal/Alertness: Awake/alert Behavior During Therapy: Flat affect Overall Cognitive Status: History of cognitive impairments - at baseline  General Comments: Pt is oriented to self and location (when given a choice of 2). He is not oriented to time but brother reports this is normal for him.              Home Living Family/patient expects to be discharged to:: Private residence Living Arrangements: Other relatives Available Help at Discharge: Personal care  attendant;Available 24 hours/day Type of Home: House Home Access: Ramped entrance     Home Layout: One level     Bathroom Shower/Tub: Walk-in shower         Home Equipment: Environmental consultant - 2 wheels;Shower seat          Prior Functioning/Environment Level of Independence: Needs assistance  Gait / Transfers Assistance Needed: Pt ambulates short distances with RW and caregiver present ADL's / Homemaking Assistance Needed: Pt takes shower with assist as needed. Caregiver reports he is able to dress himself.   Comments: R knee fusion -does not bend        OT Problem List: Decreased strength;Decreased activity tolerance;Impaired balance (sitting and/or standing);Decreased safety awareness      OT Treatment/Interventions: Self-care/ADL training;Therapeutic exercise;Therapeutic activities;Cognitive remediation/compensation;Energy conservation;Visual/perceptual remediation/compensation;DME and/or AE instruction;Patient/family education;Balance training;Manual therapy    OT Goals(Current goals can be found in the care plan section) Acute Rehab OT Goals Patient Stated Goal: to go to rehab OT Goal Formulation: With family Time For Goal Achievement: 01/18/21 Potential to Achieve Goals: Fair ADL Goals Pt Will Perform Grooming: with supervision;standing Pt Will Perform Lower Body Dressing: with supervision;sit to/from stand Pt Will Transfer to Toilet: with supervision;ambulating Pt Will Perform Toileting - Clothing Manipulation and hygiene: with supervision;sit to/from stand  OT Frequency: Min 2X/week   Barriers to D/C:    none known          AM-PAC OT "6 Clicks" Daily Activity     Outcome Measure Help from another person eating meals?: None Help from another person taking care of personal grooming?: None Help from another person toileting, which includes using toliet, bedpan, or urinal?: A Little Help from another person bathing (including washing, rinsing, drying)?: A Little Help  from another person to put on and taking off regular upper body clothing?: None Help from another person to put on and taking off regular lower body clothing?: A Little 6 Click Score: 21   End of Session Equipment Utilized During Treatment: Rolling walker Nurse Communication: Mobility status  Activity Tolerance: Patient tolerated treatment well Patient left: with call bell/phone within reach;in chair;with bed alarm set  OT Visit Diagnosis: Unsteadiness on feet (R26.81);Muscle weakness (generalized) (M62.81)                Time: 1610-9604 OT Time Calculation (min): 25 min Charges:  OT General Charges $OT Visit: 1 Visit OT Evaluation $OT Eval Moderate Complexity: 1 Mod OT Treatments $Self Care/Home Management : 8-22 mins Darleen Crocker, MS, OTR/L , CBIS ascom [phone]  01/04/21, 1:23 PM

## 2021-01-04 NOTE — Progress Notes (Signed)
Speech Language Pathology Treatment: Dysphagia  Patient Details Name: Todd Davidson MRN: [id number] DOB: 26-Feb-1936 Today's Date: 01/04/2021 Time: 1015-1100 SLP Time Calculation (min) (ACUTE ONLY): 45 min  Assessment / Plan / Recommendation Clinical Impression  Pt presents with moderate to severe oral dysphagia secondary to cognitive decline. Pt was awake and able to participate with reassessment. Brother and caregiver Todd Davidson were present and educated. Pt tolerated one piece of ice but needed cures to eventually swallow. Given a tsp of thin water, he presented with immediate coughing after bolus hold. Moderate to significant bolus hold also for nectar thick and pureed consistencies but no s/s of aspiration. Pt needed tactile and verbal cues to swallow. Asking a question that Pt had to answer was the most useful strategy in getting Pt to swallow. Discussed risk of aspiration with Po's and that Po's would be more of a pleasure than a method of nutrition with current cognitive status. Secure chat with MD re considering Dysphagia 1 diet with nectar thick BY TSP. Stop with any coughing and use of swallowing strategies to be fed by trained caregiver. MD agrees, will order diet and follow. Unless Pt has a significant improvement, may need to consider skilled nsg or hospice referral to assist brother who seems overwhelmed. Even though Pt has round the clock care at home, brother still needs to oversee this. Palliative care currently involved. Prognosis fair. If this is new cognitive baseline, prognosis for being able to tolerate a diet consistently to maintain nutrition is not good. ST to follow and assist as needed.   HPI HPI: Todd Davidson is a 85 y.o. male with medical history significant for asthma/COPD, insulin-dependent diabetes mellitus, history of prostate cancer, dementia with behavioral disturbance, hypertension, who was brought to the hospital because of acute change in mental status/lethargy. Patchy  opacities in the right base suspicious for infection in the  correct clinical setting      SLP Plan  Continue with current plan of care      Recommendations for follow up therapy are one component of a multi-disciplinary discharge planning process, led by the attending physician.  Recommendations may be updated based on patient status, additional functional criteria and insurance authorization.    Recommendations  Diet recommendations: Dysphagia 1 (puree);Nectar-thick liquid Liquids provided via: Teaspoon (nectar thick) Medication Administration: Via alternative means Supervision: Trained caregiver to feed patient Compensations: Minimize environmental distractions;Slow rate;Small sips/bites;Follow solids with liquid;Other (Comment) (ask question for Pt to answer so he will swallow then answer the question) Postural Changes and/or Swallow Maneuvers: Seated upright 90 degrees;Upright 30-60 min after meal                General recommendations: Other(comment) (skilled Nsg) Oral Care Recommendations: Oral care BID Follow up Recommendations: Skilled Nursing facility;Other (comment) (consider Hospice) SLP Visit Diagnosis: Dysphagia, oropharyngeal phase (R13.12);Cognitive communication deficit (S01.093) Plan: Continue with current plan of care       Todd Davidson  01/04/2021, 11:10 AM

## 2021-01-04 NOTE — Progress Notes (Addendum)
Progress Note    Todd Davidson  YHC:[id number] DOB: 1935-09-25  DOA: 12/31/2020 PCP: Perrin Maltese, MD      Brief Narrative:    Medical records reviewed and are as summarized below:  Prather Failla is a 85 y.o. male with medical history significant for asthma/COPD, insulin-dependent diabetes mellitus, history of prostate cancer, dementia with behavioral disturbance, hypertension, who was brought to the hospital because of acute change in mental status/lethargy.  His blood sugar at home was 34.  He was also noted to have oxygen saturation of 80% associated with use of accessory respiratory muscles.  He was placed on 15 L/min oxygen via nonrebreather mask and transported to the emergency room.   He was admitted to the hospital for hypoglycemia, acute hypoxemic respiratory failure secondary to aspiration pneumonia and COPD exacerbation.  He was treated with BiPAP initially.  He was also treated with IV dextrose, IV antibiotics and bronchodilators.       Assessment/Plan:   Principal Problem:   Acute metabolic encephalopathy Active Problems:   Diabetes mellitus with hypoglycemia, with long-term current use of insulin (HCC)   Acute respiratory failure (HCC)   Aspiration pneumonia (HCC)   COPD with acute exacerbation (HCC)   Dementia (HCC)    Body mass index is 19.16 kg/m.  Insulin-dependent diabetes mellitus, s/p hypoglycemia:   He takes Lantus 16 units daily at home.  This has been discontinued because of recurrent hypoglycemia.  Glucose levels have been stable.  Use NovoLog as needed for hyperglycemia.   Acute metabolic encephalopathy, delirium superimposed on dementia with behavioral disturbance: CT head did not show any acute stroke.  Haldol as needed for agitation.  Aspiration pneumonia: Discontinue IV Unasyn.  Arrhythmia: Telemetry showed a lot of artifacts likely from tremors.  Twelve-lead EKG showed mild sinus tachycardia.    Dysphagia: He was reevaluated by  the speech therapist and dysphagia 1 diet with nectar thick liquid was recommended.  His brother understands that he still has a high risk of aspiration even if he eats pured diet.  Discussed the possibility of feeding tube if he does not do well with dysphagia 1 diet.  He said he was not ready for that.  COPD exacerbation, acute hypoxemic respiratory failure: Improved.  He is tolerating room air.  Continue bronchodilators.  Hypokalemia: Improved  Tremors of extremities: Chart review showed that he has had tremors of the extremities since 2016.  Other comorbidities include history of left renal cell cancer s/p cryoablation, history of prostate cancer, asthma hypertension  Debility: PT recommends discharge to SNF.  Follow-up with palliative care team to discuss goals of care  Plan discussed with patient's brother and private caregiver at the bedside.   Diet Order             DIET - DYS 1 Room service appropriate? Yes; Fluid consistency: Nectar Thick  Diet effective now                      Consultants: Palliative care team  Procedures: None    Medications:    aspirin  300 mg Rectal Daily   budesonide (PULMICORT) nebulizer solution  0.25 mg Nebulization BID   enoxaparin (LOVENOX) injection  40 mg Subcutaneous Q24H   influenza vaccine adjuvanted  0.5 mL Intramuscular Tomorrow-1000   Continuous Infusions:  sodium chloride 0.45 % with kcl 50 mL/hr at 01/03/21 1400     Anti-infectives (From admission, onward)    Start  Dose/Rate Route Frequency Ordered Stop   01/01/21 1000  Ampicillin-Sulbactam (UNASYN) 3 g in sodium chloride 0.9 % 100 mL IVPB  Status:  Discontinued        3 g 200 mL/hr over 30 Minutes Intravenous Every 6 hours 01/01/21 0924 01/04/21 1706   12/31/20 1630  Ampicillin-Sulbactam (UNASYN) 3 g in sodium chloride 0.9 % 100 mL IVPB        3 g 200 mL/hr over 30 Minutes Intravenous  Once 12/31/20 1116 12/31/20 1709   12/31/20 1015  Ampicillin-Sulbactam  (UNASYN) 3 g in sodium chloride 0.9 % 100 mL IVPB        3 g 200 mL/hr over 30 Minutes Intravenous  Once 12/31/20 1003 12/31/20 1059              Family Communication/Anticipated D/C date and plan/Code Status   DVT prophylaxis: enoxaparin (LOVENOX) injection 40 mg Start: 12/31/20 1300     Code Status: DNR  Family Communication: Plan discussed with his brother at the bedside Disposition Plan: Plan to discharge home in 2 to 3 days   Status is: Inpatient  Remains inpatient appropriate because: Unsafe discharge.  Possible discharge to SNF          Subjective:   Interval events noted.  He is confused and unable to provide any history.  His brother and private caregiver were at the bedside.  Objective:    Vitals:   01/03/21 1932 01/03/21 2052 01/04/21 0346 01/04/21 1613  BP: (!) 158/90  (!) 154/80 (!) 188/81  Pulse: 61  82 (!) 57  Resp: 16  16 16   Temp: 98.2 F (36.8 C)  98.1 F (36.7 C) 98.7 F (37.1 C)  TempSrc: Oral  Oral Oral  SpO2: 94% 93% 93% 95%  Weight:      Height:       No data found.   Intake/Output Summary (Last 24 hours) at 01/04/2021 1708 Last data filed at 01/04/2021 0350 Gross per 24 hour  Intake 400.23 ml  Output 400 ml  Net 0.23 ml   Filed Weights   12/31/20 0907 01/03/21 0623  Weight: 72.6 kg 67.7 kg    Exam:  GEN: NAD SKIN: Warm and dry EYES: EOMI ENT: MMM CV: RRR PULM: CTA B ABD: soft, ND, NT, +BS CNS: AAO x 1 (person), confused,, non focal, tremors of right upper extremity EXT: No edema or tenderness       Data Reviewed:   I have personally reviewed following labs and imaging studies:  Labs: Labs show the following:   Basic Metabolic Panel: Recent Labs  Lab 12/31/20 0903 12/31/20 1521 01/01/21 0723 01/02/21 0451 01/03/21 0528 01/04/21 0743  NA 140  --  138 142 142 144  K 3.0*  --  3.3* 3.5 3.2* 3.6  CL 104  --  104 104 103 105  CO2 26  --  26 28 28 23   GLUCOSE 143*  --  169* 120* 138* 145*   BUN 10  --  10 13 14 14   CREATININE 0.81  --  0.89 0.93 0.87 0.94  CALCIUM 9.0  --  8.5* 9.0 9.0 9.1  MG  --  1.7  --   --  1.9 1.8   GFR Estimated Creatinine Clearance: 58 mL/min (by C-G formula based on SCr of 0.94 mg/dL). Liver Function Tests: Recent Labs  Lab 12/31/20 0903  AST 33  ALT 16  ALKPHOS 74  BILITOT 0.8  PROT 7.5  ALBUMIN 4.0   No results  for input(s): LIPASE, AMYLASE in the last 168 hours. No results for input(s): AMMONIA in the last 168 hours. Coagulation profile No results for input(s): INR, PROTIME in the last 168 hours.  CBC: Recent Labs  Lab 12/31/20 0903 01/01/21 0723 01/02/21 0451 01/03/21 0528  WBC 12.1* 10.7* 10.3 8.8  NEUTROABS 10.3*  --  7.8* 6.4  HGB 16.1 12.5* 14.5 14.5  HCT 44.7 36.4* 41.0 42.4  MCV 86.5 86.1 86.7 87.2  PLT 163 109* 129* 126*   Cardiac Enzymes: Recent Labs  Lab 01/01/21 0723  CKTOTAL 173   BNP (last 3 results) No results for input(s): PROBNP in the last 8760 hours. CBG: Recent Labs  Lab 01/03/21 1138 01/03/21 1653 01/03/21 2104 01/04/21 0740 01/04/21 1141  GLUCAP 185* 152* 161* 139* 160*   D-Dimer: No results for input(s): DDIMER in the last 72 hours. Hgb A1c: No results for input(s): HGBA1C in the last 72 hours. Lipid Profile: No results for input(s): CHOL, HDL, LDLCALC, TRIG, CHOLHDL, LDLDIRECT in the last 72 hours. Thyroid function studies: No results for input(s): TSH, T4TOTAL, T3FREE, THYROIDAB in the last 72 hours.  Invalid input(s): FREET3 Anemia work up: No results for input(s): VITAMINB12, FOLATE, FERRITIN, TIBC, IRON, RETICCTPCT in the last 72 hours. Sepsis Labs: Recent Labs  Lab 12/31/20 0903 12/31/20 0906 01/01/21 0723 01/02/21 0451 01/03/21 0528  PROCALCITON  --  <0.10  --   --   --   WBC 12.1*  --  10.7* 10.3 8.8    Microbiology Recent Results (from the past 240 hour(s))  Resp Panel by RT-PCR (Flu A&B, Covid) Nasopharyngeal Swab     Status: None   Collection Time: 12/31/20   9:03 AM   Specimen: Nasopharyngeal Swab; Nasopharyngeal(NP) swabs in vial transport medium  Result Value Ref Range Status   SARS Coronavirus 2 by RT PCR NEGATIVE NEGATIVE Final    Comment: (NOTE) SARS-CoV-2 target nucleic acids are NOT DETECTED.  The SARS-CoV-2 RNA is generally detectable in upper respiratory specimens during the acute phase of infection. The lowest concentration of SARS-CoV-2 viral copies this assay can detect is 138 copies/mL. A negative result does not preclude SARS-Cov-2 infection and should not be used as the sole basis for treatment or other patient management decisions. A negative result may occur with  improper specimen collection/handling, submission of specimen other than nasopharyngeal swab, presence of viral mutation(s) within the areas targeted by this assay, and inadequate number of viral copies(<138 copies/mL). A negative result must be combined with clinical observations, patient history, and epidemiological information. The expected result is Negative.  Fact Sheet for Patients:  EntrepreneurPulse.com.au  Fact Sheet for Healthcare Providers:  IncredibleEmployment.be  This test is no t yet approved or cleared by the [place] FDA and  has been authorized for detection and/or diagnosis of SARS-CoV-2 by FDA under an Emergency Use Authorization (EUA). This EUA will remain  in effect (meaning this test can be used) for the duration of the COVID-19 declaration under Section 564(b)(1) of the Act, 21 U.S.C.section 360bbb-3(b)(1), unless the authorization is terminated  or revoked sooner.       Influenza A by PCR NEGATIVE NEGATIVE Final   Influenza B by PCR NEGATIVE NEGATIVE Final    Comment: (NOTE) The Xpert Xpress SARS-CoV-2/FLU/RSV plus assay is intended as an aid in the diagnosis of influenza from Nasopharyngeal swab specimens and should not be used as a sole basis for treatment. Nasal washings and aspirates  are unacceptable for Xpert Xpress SARS-CoV-2/FLU/RSV testing.  Fact Sheet  for Patients: EntrepreneurPulse.com.au  Fact Sheet for Healthcare Providers: IncredibleEmployment.be  This test is not yet approved or cleared by the [place] FDA and has been authorized for detection and/or diagnosis of SARS-CoV-2 by FDA under an Emergency Use Authorization (EUA). This EUA will remain in effect (meaning this test can be used) for the duration of the COVID-19 declaration under Section 564(b)(1) of the Act, 21 U.S.C. section 360bbb-3(b)(1), unless the authorization is terminated or revoked.  Performed at [facility], [address] [place], [place] [address]     Procedures and diagnostic studies:  No results found.             LOS: 4 days   Janna Oak  [facility]   Pager on [email or web address]. If 7PM-7AM, please contact night-coverage at [email or web address]     01/04/2021, 5:08 PM

## 2021-01-04 NOTE — Evaluation (Addendum)
Physical Therapy Evaluation Patient Details Name: Todd Davidson MRN: [id number] DOB: 11/05/1935 Today's Date: 01/04/2021  History of Present Illness  Pt is an 85 y.o. M arriving to ED after being found on the floor by a family member and admitted for metabolic encephalopahty, acute respiratory failure, and aspiration pneumonia. PMH includes DM, hx prostate cancer, hx of dementia, COPD,  Clinical Impression  Pt alert seated in recliner with caregiver present throughout session. Pt is oriented to name only and follows one step commands intermittently requiring redirection of task. Pt demonstrates significant L UE resting tremor in which he is unable to grasp objects without physical assist. Per family and caregiver, pt's PLOF was MOD I w/ RW and did not require physical assist for mobility tasks (bed, transfers, etc). Based on today's session, pt demonstrate a large change in functional mobility requiring MOD A for bed mobility and transfers. Ambulated w/ RW in room with MIN A for maneuvering RW and hand placement 2/2 hand tremor. During amb pt incontinent with BM. PT assisted with clean up and pt returned to bed. Due to change in PLOF SNF is recommended at discharge pending establishment of goals of care. Skilled PT intervention is indicated to address deficits in function, mobility, and to return to PLOF as able.     Recommendations for follow up therapy are one component of a multi-disciplinary discharge planning process, led by the attending physician.  Recommendations may be updated based on patient status, additional functional criteria and insurance authorization.  Follow Up Recommendations SNF;Supervision for mobility/OOB;Supervision/Assistance - 24 hour    Equipment Recommendations  Other (comment) (TBD next venue of care)    Recommendations for Other Services       Precautions / Restrictions Precautions Precautions: Fall Restrictions Weight Bearing Restrictions: No       Mobility  Bed Mobility Overal bed mobility: Needs Assistance Bed Mobility: Sit to Supine     Supine to sit: Mod assist     General bed mobility comments: Pt able to scoot to [facility] w/ MOD A, pt provided time to perform supine > sit w/o assistance but unable to iniitate movement requiring BLE assist    Transfers Overall transfer level: Needs assistance Equipment used: Rolling walker (2 wheeled) Transfers: Sit to/from Stand Sit to Stand: Mod assist Stand pivot transfers: Supervision       General transfer comment: Pt unable to lift bottom from chair without physical assist.  Ambulation/Gait Ambulation/Gait assistance: Min assist Gait Distance (Feet): 15 Feet Assistive device: Rolling walker (2 wheeled) Gait Pattern/deviations: Step-to pattern     General Gait Details: Slow movements unable to advance LE without increasd time; MIN A for maneuvering RW and redirection of task  Stairs            Wheelchair Mobility    Modified Rankin (Stroke Patients Only)       Balance Overall balance assessment: Needs assistance Sitting-balance support: Feet supported;Single extremity supported Sitting balance-Leahy Scale: Good     Standing balance support: During functional activity Standing balance-Leahy Scale: Poor Standing balance comment: Required BUE support, no LOB with mobility                             Pertinent Vitals/Pain Pain Assessment: Faces Faces Pain Scale: No hurt Pain Intervention(s): Monitored during session    Home Living Family/patient expects to be discharged to:: Private residence Living Arrangements: Other relatives Available Help at Discharge: Personal care attendant;Available 24 hours/day  Type of Home: House Home Access: Ramped entrance     Home Layout: One level Home Equipment: [place] - 2 wheels;Shower seat      Prior Function Level of Independence: Needs assistance   Gait / Transfers Assistance Needed: amb household  distances w/ RW with intermittent supervision from caregiver.  ADL's / Homemaking Assistance Needed: Assistance helps with bathing  Comments: Fusion R knee     Hand Dominance   Dominant Hand: Right    Extremity/Trunk Assessment   Upper Extremity Assessment Upper Extremity Assessment: Generalized weakness (L resting tremor)    Lower Extremity Assessment Lower Extremity Assessment: Generalized weakness    Cervical / Trunk Assessment Cervical / Trunk Assessment: Kyphotic  Communication   Communication: HOH (minimal verbal communication throughout session)  Cognition Arousal/Alertness: Awake/alert Behavior During Therapy: Flat affect Overall Cognitive Status: History of cognitive impairments - at baseline                                 General Comments: Oriented to self, disoriented to place, situation; follows one step commands intermittently, required increased redirection of task while amb      General Comments      Exercises Other Exercises Other Exercises: Pt incontinent along w/ BM during amb, PT assist for clean up and   Assessment/Plan    PT Assessment Patient needs continued PT services  PT Problem List Decreased strength;Decreased range of motion;Decreased activity tolerance;Decreased balance;Decreased mobility;Decreased coordination;Decreased cognition       PT Treatment Interventions Balance training;Gait training;Stair training;Functional mobility training;Therapeutic activities;Therapeutic exercise    PT Goals (Current goals can be found in the Care Plan section)  Acute Rehab PT Goals Patient Stated Goal: none stated    Frequency Min 2X/week   Barriers to discharge        Co-evaluation               AM-PAC PT "6 Clicks" Mobility  Outcome Measure Help needed turning from your back to your side while in a flat bed without using bedrails?: A Little Help needed moving from lying on your back to sitting on the side of a flat  bed without using bedrails?: A Little Help needed moving to and from a bed to a chair (including a wheelchair)?: A Lot Help needed standing up from a chair using your arms (e.g., wheelchair or bedside chair)?: A Lot Help needed to walk in hospital room?: A Lot Help needed climbing 3-5 steps with a railing? : Total 6 Click Score: 13    End of Session Equipment Utilized During Treatment: Gait belt Activity Tolerance: Patient tolerated treatment well Patient left: in bed;with call bell/phone within reach;with family/visitor present;with bed alarm set Nurse Communication: Mobility status PT Visit Diagnosis: Other abnormalities of gait and mobility (R26.89);Muscle weakness (generalized) (M62.81);History of falling (Z91.81)    Time: 1333-1430 PT Time Calculation (min) (ACUTE ONLY): 57 min   Charges:            [employer], SPT

## 2021-01-04 NOTE — Care Management Important Message (Signed)
Important Message  Patient Details  Name: Todd Davidson MRN: [id number] Date of Birth: 1935-06-22   Medicare Important Message Given:  Yes     [name] 01/04/2021, 2:33 PM

## 2021-01-05 DIAGNOSIS — E11649 Type 2 diabetes mellitus with hypoglycemia without coma: Secondary | ICD-10-CM | POA: Diagnosis not present

## 2021-01-05 DIAGNOSIS — Z515 Encounter for palliative care: Secondary | ICD-10-CM

## 2021-01-05 DIAGNOSIS — G9341 Metabolic encephalopathy: Secondary | ICD-10-CM | POA: Diagnosis not present

## 2021-01-05 DIAGNOSIS — F03C11 Unspecified dementia, severe, with agitation: Secondary | ICD-10-CM | POA: Diagnosis not present

## 2021-01-05 DIAGNOSIS — J690 Pneumonitis due to inhalation of food and vomit: Secondary | ICD-10-CM | POA: Diagnosis not present

## 2021-01-05 DIAGNOSIS — J441 Chronic obstructive pulmonary disease with (acute) exacerbation: Secondary | ICD-10-CM | POA: Diagnosis not present

## 2021-01-05 DIAGNOSIS — Z66 Do not resuscitate: Secondary | ICD-10-CM | POA: Diagnosis present

## 2021-01-05 LAB — GLUCOSE, CAPILLARY
Glucose-Capillary: 150 mg/dL — ABNORMAL HIGH (ref 70–99)
Glucose-Capillary: 215 mg/dL — ABNORMAL HIGH (ref 70–99)
Glucose-Capillary: 197 mg/dL — ABNORMAL HIGH (ref 70–99)
Glucose-Capillary: 149 mg/dL — ABNORMAL HIGH (ref 70–99)

## 2021-01-05 MED ORDER — FINASTERIDE 5 MG PO TABS
5.0000 mg | ORAL_TABLET | Freq: Every day | ORAL | Status: DC
  Administered 2021-01-05 – 2021-01-18 (×14): 5 mg via ORAL
  Filled 2021-01-05 (×14): qty 1

## 2021-01-05 MED ORDER — AMLODIPINE BESYLATE 10 MG PO TABS
10.0000 mg | ORAL_TABLET | Freq: Every day | ORAL | Status: DC
  Administered 2021-01-05 – 2021-01-18 (×14): 10 mg via ORAL
  Filled 2021-01-05 (×15): qty 1

## 2021-01-05 MED ORDER — GABAPENTIN 600 MG PO TABS
300.0000 mg | ORAL_TABLET | Freq: Two times a day (BID) | ORAL | Status: DC
  Administered 2021-01-05 – 2021-01-18 (×27): 300 mg via ORAL
  Filled 2021-01-05 (×27): qty 1

## 2021-01-05 MED ORDER — TAMSULOSIN HCL 0.4 MG PO CAPS
0.4000 mg | ORAL_CAPSULE | Freq: Every day | ORAL | Status: DC
  Administered 2021-01-05 – 2021-01-18 (×14): 0.4 mg via ORAL
  Filled 2021-01-05 (×14): qty 1

## 2021-01-05 MED ORDER — BENAZEPRIL HCL 20 MG PO TABS
40.0000 mg | ORAL_TABLET | Freq: Every day | ORAL | Status: DC
  Administered 2021-01-05 – 2021-01-17 (×13): 40 mg via ORAL
  Filled 2021-01-05 (×14): qty 2

## 2021-01-05 MED ORDER — CITALOPRAM HYDROBROMIDE 20 MG PO TABS
10.0000 mg | ORAL_TABLET | Freq: Every day | ORAL | Status: DC
  Administered 2021-01-05 – 2021-01-17 (×13): 10 mg via ORAL
  Filled 2021-01-05 (×13): qty 1
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
## 2021-01-05 NOTE — Progress Notes (Signed)
Speech Language Pathology Treatment: Dysphagia  Patient Details Name: Todd Davidson MRN: [id number] DOB: 04/12/1935 Today's Date: 01/05/2021 Time: 1225-1300 SLP Time Calculation (min) (ACUTE ONLY): 35 min  Assessment / Plan / Recommendation Clinical Impression  Pt seen today for ongoing assessment of oral intake; toleration of diet and education w/ precautions. No family or caregivers were present in the room. Pt was awake and verbally engaged w/ SLP answering basic questions w/ min slower responses and mod dysarthria+. Positioned pt upright in bed for po trials at session.  Pt continues to present with moderate to severe oral dysphagia suspect impacted by Baseline dx of Cognitive decline and Neuromuscular disorder. Moderate to significant oral holding noted w/ all trials of Nectar liquids via tsp/straw and Purees after bolus acceptance. Pt required tactile and verbal cues to swallow and TIME. Noted lingual pumping as he attempted to generate A-P transfer and increased UE tremors/shaking. Asking a question that pt had to answer was the most useful strategy in getting pt to swallow. No consistent overt clinical s/s of aspiration noted; wet vocal quality was noted x2-3 w/ audible swallowing. Encouraged pt to use throat clearing if he could.   Pt appears at increased risk for aspiration w/ oral intake. Also concern of not meeting nutritional needs orally d/t the TIME and EFFORT w/ oral intake despite aspiration precautions. Recommend continue a Dysphagia 1 diet with nectar consistency liquids via tsp or straw(monitor straw use carefully and discontinue of coughing or trouble swallowing is noted). Stop with any coughing and use of swallowing strategies to be fed by trained caregiver. Recommend strict aspiration precautions and feeding support. Unsure of pt's D/C plan at this time; pt has round the clock care at home, brother still needs to oversee this. Recommend continued Palliative care involvement for  GOC. Prognosis is guarded. ST to follow to provide education as needed. No other diet consistency would be recommended at this time.      HPI HPI: Todd Davidson is a 85 y.o. male with medical history significant for asthma/COPD, insulin-dependent diabetes mellitus, history of prostate cancer, dementia with behavioral disturbance, hypertension, who was brought to the hospital because of acute change in mental status/lethargy. Patchy opacities in the right base suspicious for infection in the  correct clinical setting      SLP Plan  Continue with current plan of care      Recommendations for follow up therapy are one component of a multi-disciplinary discharge planning process, led by the attending physician.  Recommendations may be updated based on patient status, additional functional criteria and insurance authorization.    Recommendations  Diet recommendations: Dysphagia 1 (puree);Nectar-thick liquid (ice chips for pleasure b/t meals) Liquids provided via: Teaspoon;Straw (monitor straw use and stop if any coughing noted w/ straw) Medication Administration: Crushed with puree Supervision: Full supervision/cueing for compensatory strategies;Staff to assist with self feeding Compensations: Minimize environmental distractions;Slow rate;Small sips/bites;Lingual sweep for clearance of pocketing;Multiple dry swallows after each bite/sip;Follow solids with liquid;Clear throat intermittently Postural Changes and/or Swallow Maneuvers: Out of bed for meals;Seated upright 90 degrees;Upright 30-60 min after meal                General recommendations:  (Palliative Care f/u for GOC/education; Dietician f/u) Oral Care Recommendations: Oral care BID;Oral care before and after PO;Staff/trained caregiver to provide oral care Follow up Recommendations: Skilled Nursing facility;Home health SLP (TBD; Hospice f/u mentioned?) SLP Visit Diagnosis: Dysphagia, oropharyngeal phase (R13.12) (Neuromuscular  disorder) Plan: Continue with current plan of care  [place], MS, CCC-SLP Speech Language Pathologist Rehab Services [phone] [facility]  01/05/2021, 4:04 PM

## 2021-01-05 NOTE — Progress Notes (Signed)
Progress Note    Todd Davidson  FOY:[id number] DOB: 1935-07-05  DOA: 12/31/2020 PCP: Perrin Maltese, MD      Brief Narrative:    Medical records reviewed and are as summarized below:  Todd Davidson is a 85 y.o. male with medical history significant for asthma/COPD, insulin-dependent diabetes mellitus, history of prostate cancer, dementia with behavioral disturbance, hypertension, who was brought to the hospital because of acute change in mental status/lethargy.  His blood sugar at home was 34.  He was also noted to have oxygen saturation of 80% associated with use of accessory respiratory muscles.  He was placed on 15 L/min oxygen via nonrebreather mask and transported to the emergency room.   He was admitted to the hospital for hypoglycemia, acute hypoxemic respiratory failure secondary to aspiration pneumonia and COPD exacerbation.  He was treated with BiPAP initially.  He was also treated with IV dextrose, IV antibiotics and bronchodilators.       Assessment/Plan:   Principal Problem:   Acute metabolic encephalopathy Active Problems:   Diabetes mellitus with hypoglycemia, with long-term current use of insulin (HCC)   Acute respiratory failure (HCC)   Aspiration pneumonia (HCC)   COPD with acute exacerbation (HCC)   Dementia (HCC)    Body mass index is 19.16 kg/m.  Insulin-dependent diabetes mellitus, s/p hypoglycemia:   He takes Lantus 16 units daily at home.  This has been discontinued because of recurrent hypoglycemia.  Glucose levels have been stable.  Use NovoLog as needed for hyperglycemia.   Acute metabolic encephalopathy, delirium superimposed on dementia with behavioral disturbance: CT head did not show any acute stroke.  Haldol as needed for agitation.  Aspiration pneumonia: Completed 5-day course of IV Unasyn on 01/04/2021.  Arrhythmia: Telemetry showed a lot of artifacts likely from tremors.  Twelve-lead EKG showed mild sinus tachycardia.     Dysphagia: He was reevaluated by the speech therapist and dysphagia 1 diet with nectar thick liquid was recommended.  His brother is not interested in feeding tube for now.  Continue aspiration precautions.  COPD exacerbation, acute hypoxemic respiratory failure: Improved.  He is tolerating room air.  Continue bronchodilators.  Hypokalemia: Improved  Tremors of extremities: Chart review showed that he has had tremors of the extremities since 2016.  Other comorbidities include history of left renal cell cancer s/p cryoablation, history of prostate cancer, asthma hypertension  Debility: PT recommends discharge to SNF.  Follow-up with palliative care team to discuss goals of care  Plan discussed with patient's brother.  After meeting with palliative care team, he was patient to go to SNF with palliative care.  He may consider hospice at a later date.  Diet Order             DIET - DYS 1 Room service appropriate? Yes; Fluid consistency: Nectar Thick  Diet effective now                      Consultants: Palliative care team  Procedures: None    Medications:    amLODipine  10 mg Oral Daily   aspirin  300 mg Rectal Daily   benazepril  40 mg Oral QHS   budesonide (PULMICORT) nebulizer solution  0.25 mg Nebulization BID   citalopram  10 mg Oral QHS   enoxaparin (LOVENOX) injection  40 mg Subcutaneous Q24H   finasteride  5 mg Oral Daily   gabapentin  300 mg Oral BID   tamsulosin  0.4 mg  Oral Daily   Continuous Infusions:     Anti-infectives (From admission, onward)    Start     Dose/Rate Route Frequency Ordered Stop   01/01/21 1000  Ampicillin-Sulbactam (UNASYN) 3 g in sodium chloride 0.9 % 100 mL IVPB  Status:  Discontinued        3 g 200 mL/hr over 30 Minutes Intravenous Every 6 hours 01/01/21 0924 01/04/21 1706   12/31/20 1630  Ampicillin-Sulbactam (UNASYN) 3 g in sodium chloride 0.9 % 100 mL IVPB        3 g 200 mL/hr over 30 Minutes Intravenous  Once  12/31/20 1116 12/31/20 1709   12/31/20 1015  Ampicillin-Sulbactam (UNASYN) 3 g in sodium chloride 0.9 % 100 mL IVPB        3 g 200 mL/hr over 30 Minutes Intravenous  Once 12/31/20 1003 12/31/20 1059              Family Communication/Anticipated D/C date and plan/Code Status   DVT prophylaxis: enoxaparin (LOVENOX) injection 40 mg Start: 12/31/20 1300     Code Status: DNR  Family Communication: Plan discussed with his brother at the bedside Disposition Plan: Plan to discharge home in 2 to 3 days   Status is: Inpatient  Remains inpatient appropriate because: Unsafe discharge.  Possible discharge to SNF          Subjective:   Patient is confused unable to provide any history.  His private caregiver was at the bedside.  Objective:    Vitals:   01/05/21 0319 01/05/21 0426 01/05/21 0744 01/05/21 0750  BP: (!) 179/82 (!) 159/86 (!) 154/105   Pulse: 71 65 89   Resp: 18  20   Temp: 97.8 F (36.6 C)  97.7 F (36.5 C)   TempSrc: Oral  Oral   SpO2: 93%  95% 94%  Weight:      Height:       No data found.   Intake/Output Summary (Last 24 hours) at 01/05/2021 1529 Last data filed at 01/05/2021 1054 Gross per 24 hour  Intake 480 ml  Output 0 ml  Net 480 ml   Filed Weights   12/31/20 0907 01/03/21 0623  Weight: 72.6 kg 67.7 kg    Exam:  GEN: NAD SKIN: Warm and dry EYES: EOMI ENT: MMM CV: RRR PULM: CTA B ABD: soft, ND, NT, +BS CNS: AAO x 1, confused, nonfocal, tremors of right upper extremity EXT: No edema or tenderness    GEN: NAD SKIN: Warm and dry EYES: EOMI ENT: MMM CV: RRR PULM: CTA B ABD: soft, ND, NT, +BS CNS: AAO x 1 (person), confused,, non focal, tremors of right upper extremity EXT: No edema or tenderness       Data Reviewed:   I have personally reviewed following labs and imaging studies:  Labs: Labs show the following:   Basic Metabolic Panel: Recent Labs  Lab 12/31/20 0903 12/31/20 1521 01/01/21 0723  01/02/21 0451 01/03/21 0528 01/04/21 0743  NA 140  --  138 142 142 144  K 3.0*  --  3.3* 3.5 3.2* 3.6  CL 104  --  104 104 103 105  CO2 26  --  26 28 28 23   GLUCOSE 143*  --  169* 120* 138* 145*  BUN 10  --  10 13 14 14   CREATININE 0.81  --  0.89 0.93 0.87 0.94  CALCIUM 9.0  --  8.5* 9.0 9.0 9.1  MG  --  1.7  --   --  1.9 1.8   GFR Estimated Creatinine Clearance: 58 mL/min (by C-G formula based on SCr of 0.94 mg/dL). Liver Function Tests: Recent Labs  Lab 12/31/20 0903  AST 33  ALT 16  ALKPHOS 74  BILITOT 0.8  PROT 7.5  ALBUMIN 4.0   No results for input(s): LIPASE, AMYLASE in the last 168 hours. No results for input(s): AMMONIA in the last 168 hours. Coagulation profile No results for input(s): INR, PROTIME in the last 168 hours.  CBC: Recent Labs  Lab 12/31/20 0903 01/01/21 0723 01/02/21 0451 01/03/21 0528  WBC 12.1* 10.7* 10.3 8.8  NEUTROABS 10.3*  --  7.8* 6.4  HGB 16.1 12.5* 14.5 14.5  HCT 44.7 36.4* 41.0 42.4  MCV 86.5 86.1 86.7 87.2  PLT 163 109* 129* 126*   Cardiac Enzymes: Recent Labs  Lab 01/01/21 0723  CKTOTAL 173   BNP (last 3 results) No results for input(s): PROBNP in the last 8760 hours. CBG: Recent Labs  Lab 01/04/21 1141 01/04/21 1607 01/04/21 2047 01/05/21 0739 01/05/21 1145  GLUCAP 160* 174* 164* 150* 215*   D-Dimer: No results for input(s): DDIMER in the last 72 hours. Hgb A1c: No results for input(s): HGBA1C in the last 72 hours. Lipid Profile: No results for input(s): CHOL, HDL, LDLCALC, TRIG, CHOLHDL, LDLDIRECT in the last 72 hours. Thyroid function studies: No results for input(s): TSH, T4TOTAL, T3FREE, THYROIDAB in the last 72 hours.  Invalid input(s): FREET3 Anemia work up: No results for input(s): VITAMINB12, FOLATE, FERRITIN, TIBC, IRON, RETICCTPCT in the last 72 hours. Sepsis Labs: Recent Labs  Lab 12/31/20 0903 12/31/20 0906 01/01/21 0723 01/02/21 0451 01/03/21 0528  PROCALCITON  --  <0.10  --   --   --    WBC 12.1*  --  10.7* 10.3 8.8    Microbiology Recent Results (from the past 240 hour(s))  Resp Panel by RT-PCR (Flu A&B, Covid) Nasopharyngeal Swab     Status: None   Collection Time: 12/31/20  9:03 AM   Specimen: Nasopharyngeal Swab; Nasopharyngeal(NP) swabs in vial transport medium  Result Value Ref Range Status   SARS Coronavirus 2 by RT PCR NEGATIVE NEGATIVE Final    Comment: (NOTE) SARS-CoV-2 target nucleic acids are NOT DETECTED.  The SARS-CoV-2 RNA is generally detectable in upper respiratory specimens during the acute phase of infection. The lowest concentration of SARS-CoV-2 viral copies this assay can detect is 138 copies/mL. A negative result does not preclude SARS-Cov-2 infection and should not be used as the sole basis for treatment or other patient management decisions. A negative result may occur with  improper specimen collection/handling, submission of specimen other than nasopharyngeal swab, presence of viral mutation(s) within the areas targeted by this assay, and inadequate number of viral copies(<138 copies/mL). A negative result must be combined with clinical observations, patient history, and epidemiological information. The expected result is Negative.  Fact Sheet for Patients:  EntrepreneurPulse.com.au  Fact Sheet for Healthcare Providers:  IncredibleEmployment.be  This test is no t yet approved or cleared by the [place] FDA and  has been authorized for detection and/or diagnosis of SARS-CoV-2 by FDA under an Emergency Use Authorization (EUA). This EUA will remain  in effect (meaning this test can be used) for the duration of the COVID-19 declaration under Section 564(b)(1) of the Act, 21 U.S.C.section 360bbb-3(b)(1), unless the authorization is terminated  or revoked sooner.       Influenza A by PCR NEGATIVE NEGATIVE Final   Influenza B by PCR NEGATIVE NEGATIVE Final  Comment: (NOTE) The Xpert Xpress  SARS-CoV-2/FLU/RSV plus assay is intended as an aid in the diagnosis of influenza from Nasopharyngeal swab specimens and should not be used as a sole basis for treatment. Nasal washings and aspirates are unacceptable for Xpert Xpress SARS-CoV-2/FLU/RSV testing.  Fact Sheet for Patients: EntrepreneurPulse.com.au  Fact Sheet for Healthcare Providers: IncredibleEmployment.be  This test is not yet approved or cleared by the [place] FDA and has been authorized for detection and/or diagnosis of SARS-CoV-2 by FDA under an Emergency Use Authorization (EUA). This EUA will remain in effect (meaning this test can be used) for the duration of the COVID-19 declaration under Section 564(b)(1) of the Act, 21 U.S.C. section 360bbb-3(b)(1), unless the authorization is terminated or revoked.  Performed at [facility], [address] [place], [place] [address]     Procedures and diagnostic studies:  No results found.             LOS: 5 days   Todd Davidson  [facility]   Pager on [email or web address]. If 7PM-7AM, please contact night-coverage at [email or web address]     01/05/2021, 3:29 PM

## 2021-01-05 NOTE — Progress Notes (Signed)
Palliative Care Progress Note, Assessment & Plan   Patient Name: Todd Davidson       Date: 01/05/2021 DOB: 02/25/1936  Age: 85 y.o. MRN#: [id number] Attending Physician: Jennye Boroughs, MD Primary Care Physician: Perrin Maltese, MD Admit Date: 12/31/2020  Reason for Consultation/Follow-up: Establishing goals of care  Subjective: Patient is resting in bed in NAD with mild RUE tremor. No signs of pain. Patient does not verbalize but tracks me with his eyes. Patient's caregiver, brother, and cousin are at bedside.   HPI: Patient is an 85 year old male with a past medical history significant for type 2 diabetes, history of prostate cancer, dementia with behavioral disturbance, asthma, COPD, CAD, diverticulosis, gastritis, GERD, hypertension, hyperlipidemia, right leg fracture, history of COVID, history of kidney stones, and history of left renal cell cancer.  Patient presented on 1013 with acute metabolic encephalopathy which resolved with hydration.  He was also found to be hypoglycemic and was in acute hypoxemic respiratory failure secondary to aspiration pneumonia.  Palliative medicine team was consulted to discuss Todd Davidson and disposition.  Plan of Care: I have reviewed medical records including EPIC notes, labs and imaging, received report from beside RN, assessed the patient and then met with patient's brother and cousin in separate conference room  to discuss diagnosis prognosis, Todd Davidson, EOL wishes, disposition and options.  I introduced Palliative Medicine as specialized medical care for people living with serious illness. It focuses on providing relief from the symptoms and stress of a serious illness. The goal is to improve quality of life for both the patient and the family.  We discussed patient's  current illness and what it means in the larger context of patient's on-going co-morbidities.  Patient's brother has noticed a steady decline in patient over the last year.  He endorses functional status has become poor.  He reports the patient oftentimes thinks he can get out of bed but is not strong enough to be able to do so without wavering or following 1 way or the other.  As far as nutritional status patient is not eating or drinking as much as he was a few months ago.  As far as his cognitive abilities the brother has noticed a marked and drastic change in patient's ability to remember or converse.    I reviewed that dementia is a progressive and terminal illness.  Todd Davidson shared understanding that he believes his brother is at end-of-life.  He just is concerned about where the patient will be cared for since he feels like he can no longer manage his care at home.  While the patient has 24/7 caregivers it has been difficult to have consistent quality care according to Todd Davidson.  Todd Davidson is open to his brother being placed in a facility.  He would also like to know his options for ensuring adequate nutrition for his brother.    I reviewed the benefits and risks associated with a percutaneous endoscopic gastrostomy.  I outlined the burdens and complications associated with tube feeding such as wound dehiscence, pain at the tube site, risk for infection, increased risk of aspiration, localized bleeding or hematomas, and the likelihood that given the patient has dementia he would pull it out.  I also shared the poor prognostic indicators for a peg tube placement that the patient has which includes age of greater than 54 years old, male gender, diabetes, COPD, previous aspiration, confusion, and recent hospitalization.  Given all this information both Todd Davidson and the patient's cousin agreed that they would not would a feeding tube for the patient.  Instead they would opt for comfort feeds and allow the patient  to eat a nectar thickened and dysphagia 1 diet as he chooses.  Natural disease trajectory and expectations at EOL were discussed.  Todd Davidson confirmed understanding that the patient will likely continue to have a steady functional, nutritional, and cognitive decline.  Todd Davidson is just concerned for the patient's safety and ensuring he has the proper care.  He would like to have the patient placed in a skilled nursing facility with the hopes of a long-term facility or long-term memory care option with hospice.  I shared the insurance restraints and suggested that we move forward with a skilled nursing facility placement with outpatient palliative services to follow along.  Todd Davidson was in agreement with this plan.  Todd Davidson appears to have a heavy caregiver burden. I suggested that he utilize the resources for caregivers that Palliative Outpatient will offer.   Attending physician and transitions of care team notified of family's wishes.  The difference between aggressive medical intervention and comfort care was considered in light of the patient's goals of care.   Discussed with patient/family the importance of continued conversation with family and the medical providers regarding overall plan of care and treatment options, ensuring decisions are within the context of the patient's values and GOCs.    Questions and concerns were addressed. The family was encouraged to call with questions or concerns.   Code Status: DNR  Prognosis:  Unable to determine  Discharge Planning: [facility] for rehab with Palliative care service follow-up  Care plan was discussed with Dr. Mal Misty, bedside RN, [facility] social worker, PT  Length of Stay: 5  Physical Exam HENT:     Head: Normocephalic and atraumatic.     Mouth/Throat:     Mouth: Mucous membranes are moist.  Cardiovascular:     Rate and Rhythm: Normal rate.  Pulmonary:     Effort: Pulmonary effort is normal.  Abdominal:     Palpations:  Abdomen is soft.  Musculoskeletal:     Comments: Bilateral LE weakness  Skin:    General: Skin is warm and dry.  Neurological:     Mental Status: He is alert. Mental status is at baseline.            Vital Signs: BP (!) 144/63 (BP Location: Left Arm)   Pulse 68   Temp 98.1 F (36.7 C) (Oral)   Resp 16   Ht '6\' 2"'  (1.88 m)   Wt 67.7 kg   SpO2 94%   BMI 19.16 kg/m  SpO2: SpO2: 94 % O2 Device: O2 Device: Room Air O2 Flow Rate: O2 Flow Rate (L/min): 3 L/min      Palliative Assessment/Data: 40%    Flowsheet Rows    Flowsheet Row Most Recent Value  Intake Tab   Referral Department Hospitalist  Unit at Time of Referral Med/Surg Unit  Palliative Care Primary Diagnosis Other (Comment)  Date Notified 01/01/21  Palliative Care Type New Palliative care  Reason for referral Clarify Goals of Care  Date of Admission 12/31/20  Date first seen by Palliative Care 01/01/21  # of days Palliative referral response time 0  Day(s)  # of days IP prior to Palliative referral 1  Clinical Assessment   Palliative Performance Scale Score 50%  Pain Max last 24 hours Not able to report  Pain Min Last 24 hours Not able to report  Dyspnea Max Last 24 Hours Not able to report  Dyspnea Min Last 24 hours Not able to report  Psychosocial & Spiritual Assessment   Palliative Care Outcomes         Total Time 35 minutes Prolonged Time Billed  no   Greater than 50%  of this time was spent counseling and coordinating care related to the above assessment and plan.  Thank you for allowing the Palliative Medicine Team to assist in the care of this patient.  Albion Ilsa Iha, FNP-BC Palliative Medicine Team Team Phone # [phone]
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
## 2021-01-06 DIAGNOSIS — G9341 Metabolic encephalopathy: Secondary | ICD-10-CM | POA: Diagnosis not present

## 2021-01-06 DIAGNOSIS — J690 Pneumonitis due to inhalation of food and vomit: Secondary | ICD-10-CM | POA: Diagnosis not present

## 2021-01-06 DIAGNOSIS — J9601 Acute respiratory failure with hypoxia: Secondary | ICD-10-CM | POA: Diagnosis not present

## 2021-01-06 DIAGNOSIS — E876 Hypokalemia: Secondary | ICD-10-CM

## 2021-01-06 LAB — GLUCOSE, CAPILLARY
Glucose-Capillary: 150 mg/dL — ABNORMAL HIGH (ref 70–99)
Glucose-Capillary: 223 mg/dL — ABNORMAL HIGH (ref 70–99)
Glucose-Capillary: 195 mg/dL — ABNORMAL HIGH (ref 70–99)
Glucose-Capillary: 184 mg/dL — ABNORMAL HIGH (ref 70–99)

## 2021-01-06 MED ORDER — QUETIAPINE FUMARATE 25 MG PO TABS
25.0000 mg | ORAL_TABLET | Freq: Every day | ORAL | Status: DC
  Administered 2021-01-06 – 2021-01-17 (×12): 25 mg via ORAL
  Filled 2021-01-06 (×12): qty 1

## 2021-01-06 MED ORDER — LACTULOSE 10 GM/15ML PO SOLN
20.0000 g | Freq: Once | ORAL | Status: AC
  Administered 2021-01-07: 20 g via ORAL
  Filled 2021-01-06: qty 30

## 2021-01-06 NOTE — TOC Progression Note (Addendum)
Transition of Care [facility]) - Progression Note    Patient Details  Name: Todd Davidson MRN: [id number] Date of Birth: 1935/06/28  Transition of Care [facility]) CM/SW Contact  Beverly Sessions, RN Phone Number: 01/06/2021, 9:20 AM  Clinical Narrative:     MD notified auth received  If medically ready for discharge will need repeat covid test Tina at [facility] confirms they can accept patient today  Update patient has to be sitter and mitten free 24 hours before discharge.  Earliest discharge tomorrow   Expected Discharge Plan: Skilled Nursing Facility Barriers to Discharge: Barriers Resolved  Expected Discharge Plan and Services Expected Discharge Plan: [place]     Post Acute Care Choice: [place]                                         Social Determinants of Health (SDOH) Interventions    Readmission Risk Interventions Readmission Risk Prevention Plan 11/05/2018  Post Dischage Appt Not Complete  Appt Comments Pt discharging to SNF  Medication Screening Complete  Transportation Screening Complete  Some recent data might be hidden

## 2021-01-06 NOTE — Progress Notes (Signed)
PROGRESS NOTE    Todd Davidson  FUX:[id number] DOB: [birth year] DOA: 12/31/2020 PCP: Perrin Maltese, MD   Chief complaint.  Shortness of breath. Brief Narrative:  Todd Davidson is a 85 y.o. male with medical history significant for asthma/COPD, insulin-dependent diabetes mellitus, history of prostate cancer, dementia with behavioral disturbance, hypertension, who was brought to the hospital because of acute change in mental status/lethargy.  His blood sugar at home was 34.  He was also noted to have oxygen saturation of 80% associated with use of accessory respiratory muscles.  He was placed on 15 L/min oxygen via nonrebreather mask and transported to the emergency room.    He was admitted to the hospital for hypoglycemia, acute hypoxemic respiratory failure secondary to aspiration pneumonia and COPD exacerbation.  He was treated with BiPAP initially.  He was also treated with IV dextrose, IV antibiotics and bronchodilators.   Assessment & Plan:   Principal Problem:   Acute metabolic encephalopathy Active Problems:   Diabetes mellitus with hypoglycemia, with long-term current use of insulin (HCC)   Acute respiratory failure (HCC)   Aspiration pneumonia (HCC)   COPD with acute exacerbation (HCC)   Dementia (HCC)   DNR (do not resuscitate)   Palliative care by specialist  Acute hypoxemic respiratory failure secondary to aspiration pneumonia. Aspiration pneumonia due to dysphagia. Dysphagia. COPD exacerbation. Patient had a significant respiratory distress at the time of admission, he was initially requiring BiPAP. Patient so far had improved.  He has completed antibiotics for aspiration pneumonia. Been seen by speech therapy, currently on dysphagia 1 diet with nectar thick liquid. Patient has improved, off oxygen.  Acute metabolic encephalopathy. Baseline dementia. Patient still requiring a sitter.  He has a poor sleep pattern in the night.  We will start  Seroquel.  Tremor. Severe debility. History of left renal cell carcinoma. History of prostate cancer. Hypertension.    DVT prophylaxis: Lovenox Code Status: DNR Family Communication: Brother updated. Disposition Plan:    Status is: Inpatient  Remains inpatient appropriate because: Unsafe discharge plan.  Patient has been accepted to go to nursing home, but still requiring sitter.  Most likely will be discharged to nursing home tomorrow.        I/O last 3 completed shifts: In: 480 [P.O.:480] Out: 0  Total I/O In: 360 [P.O.:360] Out: 600 [Urine:600]     Consultants:  None  Procedures: None  Antimicrobials: None    Subjective: Tremor, baseline confusion.  He is not agitated. Denies any short of breath or cough. No fever or chills. No dysuria hematuria  No abdominal pain or nausea vomiting.  Objective: Vitals:   01/05/21 1953 01/06/21 0532 01/06/21 0739 01/06/21 0819  BP: (!) 154/84 (!) 161/81 (!) 164/91   Pulse: 67 75 75   Resp: 16 16 16    Temp: 97.8 F (36.6 C) 97.9 F (36.6 C) 98.6 F (37 C)   TempSrc: Oral Axillary Axillary   SpO2: 95% 95% 94% 94%  Weight:   65.4 kg   Height:        Intake/Output Summary (Last 24 hours) at 01/06/2021 1235 Last data filed at 01/06/2021 1023 Gross per 24 hour  Intake 360 ml  Output 600 ml  Net -240 ml   Filed Weights   12/31/20 0907 01/03/21 0623 01/06/21 0739  Weight: 72.6 kg 67.7 kg 65.4 kg    Examination:  General exam: Appears calm and comfortable  Respiratory system: Clear to auscultation. Respiratory effort normal. Cardiovascular system: S1 & S2  heard, RRR. No JVD, murmurs, rubs, gallops or clicks. No pedal edema. Gastrointestinal system: Abdomen is nondistended, soft and nontender. No organomegaly or masses felt. Normal bowel sounds heard. Central nervous system: Alert and oriented x1. No focal neurological deficits. Extremities: Significant arm tremors Skin: No rashes, lesions or ulcers       Data Reviewed: I have personally reviewed following labs and imaging studies  CBC: Recent Labs  Lab 12/31/20 0903 01/01/21 0723 01/02/21 0451 01/03/21 0528  WBC 12.1* 10.7* 10.3 8.8  NEUTROABS 10.3*  --  7.8* 6.4  HGB 16.1 12.5* 14.5 14.5  HCT 44.7 36.4* 41.0 42.4  MCV 86.5 86.1 86.7 87.2  PLT 163 109* 129* 941*   Basic Metabolic Panel: Recent Labs  Lab 12/31/20 0903 12/31/20 1521 01/01/21 0723 01/02/21 0451 01/03/21 0528 01/04/21 0743  NA 140  --  138 142 142 144  K 3.0*  --  3.3* 3.5 3.2* 3.6  CL 104  --  104 104 103 105  CO2 26  --  26 28 28 23   GLUCOSE 143*  --  169* 120* 138* 145*  BUN 10  --  10 13 14 14   CREATININE 0.81  --  0.89 0.93 0.87 0.94  CALCIUM 9.0  --  8.5* 9.0 9.0 9.1  MG  --  1.7  --   --  1.9 1.8   GFR: Estimated Creatinine Clearance: 56 mL/min (by C-G formula based on SCr of 0.94 mg/dL). Liver Function Tests: Recent Labs  Lab 12/31/20 0903  AST 33  ALT 16  ALKPHOS 74  BILITOT 0.8  PROT 7.5  ALBUMIN 4.0   No results for input(s): LIPASE, AMYLASE in the last 168 hours. No results for input(s): AMMONIA in the last 168 hours. Coagulation Profile: No results for input(s): INR, PROTIME in the last 168 hours. Cardiac Enzymes: Recent Labs  Lab 01/01/21 0723  CKTOTAL 173   BNP (last 3 results) No results for input(s): PROBNP in the last 8760 hours. HbA1C: No results for input(s): HGBA1C in the last 72 hours. CBG: Recent Labs  Lab 01/05/21 1145 01/05/21 1632 01/05/21 2252 01/06/21 0803 01/06/21 1136  GLUCAP 215* 197* 149* 150* 223*   Lipid Profile: No results for input(s): CHOL, HDL, LDLCALC, TRIG, CHOLHDL, LDLDIRECT in the last 72 hours. Thyroid Function Tests: No results for input(s): TSH, T4TOTAL, FREET4, T3FREE, THYROIDAB in the last 72 hours. Anemia Panel: No results for input(s): VITAMINB12, FOLATE, FERRITIN, TIBC, IRON, RETICCTPCT in the last 72 hours. Sepsis Labs: Recent Labs  Lab 12/31/20 0906  PROCALCITON  <0.10    Recent Results (from the past 240 hour(s))  Resp Panel by RT-PCR (Flu A&B, Covid) Nasopharyngeal Swab     Status: None   Collection Time: 12/31/20  9:03 AM   Specimen: Nasopharyngeal Swab; Nasopharyngeal(NP) swabs in vial transport medium  Result Value Ref Range Status   SARS Coronavirus 2 by RT PCR NEGATIVE NEGATIVE Final    Comment: (NOTE) SARS-CoV-2 target nucleic acids are NOT DETECTED.  The SARS-CoV-2 RNA is generally detectable in upper respiratory specimens during the acute phase of infection. The lowest concentration of SARS-CoV-2 viral copies this assay can detect is 138 copies/mL. A negative result does not preclude SARS-Cov-2 infection and should not be used as the sole basis for treatment or other patient management decisions. A negative result may occur with  improper specimen collection/handling, submission of specimen other than nasopharyngeal swab, presence of viral mutation(s) within the areas targeted by this assay, and inadequate number of  viral copies(<138 copies/mL). A negative result must be combined with clinical observations, patient history, and epidemiological information. The expected result is Negative.  Fact Sheet for Patients:  EntrepreneurPulse.com.au  Fact Sheet for Healthcare Providers:  IncredibleEmployment.be  This test is no t yet approved or cleared by the [place] FDA and  has been authorized for detection and/or diagnosis of SARS-CoV-2 by FDA under an Emergency Use Authorization (EUA). This EUA will remain  in effect (meaning this test can be used) for the duration of the COVID-19 declaration under Section 564(b)(1) of the Act, 21 U.S.C.section 360bbb-3(b)(1), unless the authorization is terminated  or revoked sooner.       Influenza A by PCR NEGATIVE NEGATIVE Final   Influenza B by PCR NEGATIVE NEGATIVE Final    Comment: (NOTE) The Xpert Xpress SARS-CoV-2/FLU/RSV plus assay is intended  as an aid in the diagnosis of influenza from Nasopharyngeal swab specimens and should not be used as a sole basis for treatment. Nasal washings and aspirates are unacceptable for Xpert Xpress SARS-CoV-2/FLU/RSV testing.  Fact Sheet for Patients: EntrepreneurPulse.com.au  Fact Sheet for Healthcare Providers: IncredibleEmployment.be  This test is not yet approved or cleared by the [place] FDA and has been authorized for detection and/or diagnosis of SARS-CoV-2 by FDA under an Emergency Use Authorization (EUA). This EUA will remain in effect (meaning this test can be used) for the duration of the COVID-19 declaration under Section 564(b)(1) of the Act, 21 U.S.C. section 360bbb-3(b)(1), unless the authorization is terminated or revoked.  Performed at [facility], [address] [place], [place] [address]          Radiology Studies: No results found.      Scheduled Meds:  amLODipine  10 mg Oral Daily   aspirin  300 mg Rectal Daily   benazepril  40 mg Oral QHS   budesonide (PULMICORT) nebulizer solution  0.25 mg Nebulization BID   citalopram  10 mg Oral QHS   enoxaparin (LOVENOX) injection  40 mg Subcutaneous Q24H   finasteride  5 mg Oral Daily   gabapentin  300 mg Oral BID   lactulose  20 g Oral Once   QUEtiapine  25 mg Oral QHS   tamsulosin  0.4 mg Oral Daily   Continuous Infusions:   LOS: 6 days    Time spent: 32 minutes, more than 50% time involved in direct patient care.    Sharen Hones, MD [facility]   To contact the attending provider between 7A-7P or the covering provider during after hours 7P-7A, please log into the web site [email or web address] and access using universal [place] password for that web site. If you do not have the password, please call the hospital operator.  01/06/2021, 12:35 PM

## 2021-01-07 DIAGNOSIS — J9601 Acute respiratory failure with hypoxia: Secondary | ICD-10-CM | POA: Diagnosis not present

## 2021-01-07 DIAGNOSIS — G9341 Metabolic encephalopathy: Secondary | ICD-10-CM | POA: Diagnosis not present

## 2021-01-07 DIAGNOSIS — J690 Pneumonitis due to inhalation of food and vomit: Secondary | ICD-10-CM | POA: Diagnosis not present

## 2021-01-07 LAB — CBC
HCT: 45.6 % (ref 39.0–52.0)
Hemoglobin: 15.7 g/dL (ref 13.0–17.0)
MCH: 30.9 pg (ref 26.0–34.0)
MCHC: 34.4 g/dL (ref 30.0–36.0)
MCV: 89.8 fL (ref 80.0–100.0)
Platelets: 125 10*3/uL — ABNORMAL LOW (ref 150–400)
RBC: 5.08 MIL/uL (ref 4.22–5.81)
RDW: 13.9 % (ref 11.5–15.5)
WBC: 5.8 10*3/uL (ref 4.0–10.5)
nRBC: 0 % (ref 0.0–0.2)

## 2021-01-07 LAB — GLUCOSE, CAPILLARY
Glucose-Capillary: 139 mg/dL — ABNORMAL HIGH (ref 70–99)
Glucose-Capillary: 176 mg/dL — ABNORMAL HIGH (ref 70–99)
Glucose-Capillary: 176 mg/dL — ABNORMAL HIGH (ref 70–99)
Glucose-Capillary: 143 mg/dL — ABNORMAL HIGH (ref 70–99)

## 2021-01-07 LAB — CREATININE, SERUM
Creatinine, Ser: 1.03 mg/dL (ref 0.61–1.24)
GFR, Estimated: >60 mL/min (ref >60)

## 2021-01-07 LAB — RESP PANEL BY RT-PCR (FLU A&B, COVID) ARPGX2
Influenza A by PCR: NEGATIVE
Influenza B by PCR: NEGATIVE
SARS Coronavirus 2 by RT PCR: POSITIVE — AB

## 2021-01-07 MED ORDER — QUETIAPINE FUMARATE 25 MG PO TABS
25.0000 mg | ORAL_TABLET | Freq: Every day | ORAL | Status: DC
Start: 2021-01-07 — End: 2023-02-14

## 2021-01-07 NOTE — Progress Notes (Signed)
Patient COVID test come back positive, will not be able to discharge to nursing home until 10/31.

## 2021-01-07 NOTE — TOC Progression Note (Signed)
Transition of Care [facility]) - Progression Note    Patient Details  Name: Todd Davidson MRN: [id number] Date of Birth: [birth year]  Transition [facility]) CM/SW Contact  Beverly Sessions, RN Phone Number: 01/07/2021, 12:31 PM  Clinical Narrative:     Patient was set for discharge however repeat covid test positive Family and facility aware.  Per facility can not admit till 10/31 Update [employer] in navi portal  Will need auth prior to discharge   Expected Discharge Plan: [place] Barriers to Discharge: Barriers Resolved  Expected Discharge Plan and Services Expected Discharge Plan: [place] Choice: [place]   Expected Discharge Date: 01/07/21                                     Social Determinants of Health (SDOH) Interventions    Readmission Risk Interventions Readmission Risk Prevention Plan 11/05/2018  Post Dischage Appt Not Complete  Appt Comments Pt discharging to SNF  Medication Screening Complete  Transportation Screening Complete  Some recent data might be hidden

## 2021-01-07 NOTE — Care Management Important Message (Signed)
Important Message  Patient Details  Name: Todd Davidson MRN: [id number] Date of Birth: 09/24/1935   Medicare Important Message Given:  Yes     Juliann Pulse A Ellin Fitzgibbons 01/07/2021, 11:08 AM

## 2021-01-07 NOTE — Discharge Summary (Addendum)
Physician Discharge Summary  Patient ID: Todd Davidson MRN: [id number] DOB/AGE: 06-18-1935 85 y.o.  Admit date: 12/31/2020 Discharge date: 01/18/2021  Admission Diagnoses:  Discharge Diagnoses:  Principal Problem:   Acute metabolic encephalopathy Active Problems:   Thrombocytopenia ([place])   Diabetes mellitus with hypoglycemia, with long-term current use of insulin (HCC)   Acute respiratory failure ([place])   Aspiration pneumonia (HCC)   COPD with acute exacerbation ([place])   Dementia ([place])   DNR (do not resuscitate)   Palliative care by specialist   Hypokalemia   COVID-19 virus infection   Bowel and bladder incontinence   Discharged Condition: fair  Hospital Course:  Todd Davidson is a 85 y.o. male with medical history significant for asthma/COPD, insulin-dependent diabetes mellitus, history of prostate cancer, dementia with behavioral disturbance, hypertension, who was brought to the hospital because of acute change in mental status/lethargy.  His blood sugar at home was 34.  He was also noted to have oxygen saturation of 80% associated with use of accessory respiratory muscles.  He was placed on 15 L/min oxygen via nonrebreather mask and transported to the emergency room.    He was admitted to the hospital for hypoglycemia, acute hypoxemic respiratory failure secondary to aspiration pneumonia and COPD exacerbation.  He was treated with BiPAP initially.  He was also treated with IV dextrose, IV antibiotics and bronchodilators.  Acute hypoxemic respiratory failure secondary to aspiration pneumonia. Aspiration pneumonia due to dysphagia. Dysphagia. COPD exacerbation. Patient had a significant respiratory distress at the time of admission, he was initially requiring BiPAP. Patient so far has improved.  He has completed antibiotics for aspiration pneumonia. Been seen by speech therapy, currently on dysphagia 1 diet with nectar thick liquid. Patient has improved, off oxygen.    Acute metabolic encephalopathy. Baseline dementia. Patient still has intermittent confusion, tremor.  But no agitation.  He no longer requires a Actuary.  Type 2 diabetes with hypoglycemia. Patient insulin was discontinued while in the hospital.  Due to long-term prognosis, insulin is not justified.  Tremor. Severe debility. History of left renal cell carcinoma. History of prostate cancer. Hypertension. Patient has prognosis for long-term due to significant risk for aspiration, and dementia.  He has been followed by palliative care.  We will continue palliative care in the nursing home with consideration for Hospice. Discussed with patient brother, POA, patient may transfer to hospice if condition deteriorates.   Consults: Palliative care  Significant Diagnostic Studies:   Treatments: D5, fluids, antibiotics  Discharge Exam: Blood pressure (!) 141/82, pulse 83, temperature 97.6 F (36.4 C), temperature source Oral, resp. rate 18, height 6\' 2"  (1.88 m), weight 65.4 kg, SpO2 97 %. General appearance: alert, cooperative, and confused Resp: clear to auscultation bilaterally Cardio: regular rate and rhythm, S1, S2 normal, no murmur, click, rub or gallop GI: soft, non-tender; bowel sounds normal; no masses,  no organomegaly Extremities: no edema, significant tremor  Disposition: Discharge disposition: 03-Skilled Nursing Facility      Discharge Instructions     Amb Referral to Palliative Care   Complete by: As directed    Diet - low sodium heart healthy   Complete by: As directed    Diet general   Complete by: As directed    Dysphagia 1 diet with nectar thick liquid.   Increase activity slowly   Complete by: As directed    Increase activity slowly   Complete by: As directed       Allergies as of 01/18/2021  Reactions   Macrolides And Ketolides Swelling   "-mycin" drugs "-mycin" drugs "-mycin" drugs   Erythromycin    Other reaction(s): Unknown   Propranolol     Sulfa Antibiotics Swelling, Other (See Comments)   Other reaction(s): Other (See Comments)   Sulfa Antibiotics Swelling   Sulfasalazine Swelling   Tape Other (See Comments)   Irritates the skin - raw   Tapentadol Other (See Comments)   Heavy tape causes raw skin   Tapentadol    Telbivudine Swelling   "mycin" drugs   Tape Other (See Comments), Rash   Heavy tape causes raw skin        Medication List     STOP taking these medications    Accu-Chek Aviva Plus test strip Generic drug: glucose blood   albuterol (2.5 MG/3ML) 0.083% nebulizer solution Commonly known as: PROVENTIL   finasteride 5 MG tablet Commonly known as: PROSCAR   heparin 5000 UNIT/ML injection   Lantus SoloStar 100 UNIT/ML Solostar Pen Generic drug: insulin glargine   Lidocaine 4 % Ptch   methocarbamol 500 MG tablet Commonly known as: ROBAXIN   nitroGLYCERIN 0.4 MG SL tablet Commonly known as: NITROSTAT   rosuvastatin 40 MG tablet Commonly known as: CRESTOR   SootheNeb NBL 100 Adult Mask Misc   tamsulosin 0.4 MG Caps capsule Commonly known as: FLOMAX   traZODone 50 MG tablet Commonly known as: DESYREL       TAKE these medications    acetaminophen 325 MG tablet Commonly known as: TYLENOL Take 2 tablets (650 mg total) by mouth every 6 (six) hours as needed for mild pain.   amLODipine 10 MG tablet Commonly known as: NORVASC Take 10 mg by mouth daily.   aspirin 81 MG chewable tablet Chew 81 mg by mouth daily. AM   benazepril 40 MG tablet Commonly known as: LOTENSIN Take 40 mg by mouth at bedtime.   citalopram 10 MG tablet Commonly known as: CELEXA Take 10 mg by mouth at bedtime.   clotrimazole 1 % cream Commonly known as: LOTRIMIN Apply 1 application topically 2 (two) times daily.   feeding supplement (NEPRO CARB STEADY) Liqd Take 237 mLs by mouth 3 (three) times daily between meals.   gabapentin 300 MG capsule Commonly known as: NEURONTIN Take 300 mg by mouth 2 (two)  times daily.   guaiFENesin 600 MG 12 hr tablet Commonly known as: MUCINEX Take 600 mg by mouth 2 (two) times daily.   hydrocortisone 2.5 % rectal cream Commonly known as: ANUSOL-HC Apply 1 application topically daily as needed.   ipratropium-albuterol 0.5-2.5 (3) MG/3ML Soln Commonly known as: DUONEB Take 3 mLs by nebulization.   omeprazole 40 MG capsule Commonly known as: PRILOSEC Take 40 mg by mouth daily.   QUEtiapine 25 MG tablet Commonly known as: SEROQUEL Take 1 tablet (25 mg total) by mouth at bedtime.   Trelegy Ellipta 100-62.5-25 MCG/ACT Aepb Generic drug: Fluticasone-Umeclidin-Vilant Inhale 1 puff into the lungs daily.        Contact information for follow-up providers     Perrin Maltese, MD. Schedule an appointment as soon as possible for a visit in 3 day(s).   Specialty: Internal Medicine Why: [facility] Discharge F/UP Contact information: [place] [address] [phone]              Contact information for after-discharge care     Destination     [facility] Preferred SNF .   Service: Skilled Nursing Contact information: [address] [place] [place] [phone]                    35 minutes Signed: Max Sane 01/18/2021, 7:54 AM

## 2021-01-07 NOTE — Progress Notes (Signed)
Occupational Therapy Treatment Patient Details Name: Todd Davidson MRN: [id number] DOB: [birth year] Today's Date: 01/07/2021   History of present illness Pt is an 85 y.o. M arriving to ED after being found on the floor by a family member and admitted for metabolic encephalopahty, acute respiratory failure, and aspiration pneumonia. PMH includes DM, hx prostate cancer, hx of dementia, COPD,   OT comments  [name]  seen for OT treatment on this date. Upon arrival to room pt awake/alert. Pt seated upright in bed. Pt agreeable to tx. Pt required MOD A for t/f, assist needed for lift from toilet, multimodal cueing for hand positioning and planning. Pt MIN A for functional mobility. Pt  progressing toward goals. Pt continues to benefit from skilled OT services to maximize return to PLOF and minimize risk of future falls, injury, caregiver burden, and readmission. Will continue to follow POC. Discharge recommendation remains appropriate.     Recommendations for follow up therapy are one component of a multi-disciplinary discharge planning process, led by the attending physician.  Recommendations may be updated based on patient status, additional functional criteria and insurance authorization.    Follow Up Recommendations  Home health OT;Supervision/Assistance - 24 hour    Equipment Recommendations  None recommended by OT    Recommendations for Other Services      Precautions / Restrictions Precautions Precautions: Fall Restrictions Weight Bearing Restrictions: No       Mobility Bed Mobility Overal bed mobility: Needs Assistance Bed Mobility: Sit to Supine;Supine to Sit     Supine to sit: Supervision Sit to supine: Supervision   General bed mobility comments: + VCs    Transfers Overall transfer level: Needs assistance Equipment used: Rolling walker (2 wheeled) Transfers: Sit to/from Stand Sit to Stand: Mod assist              Balance Overall balance assessment: Needs  assistance Sitting-balance support: Feet supported Sitting balance-Leahy Scale: Good     Standing balance support: During functional activity Standing balance-Leahy Scale: Fair                             ADL either performed or assessed with clinical judgement   ADL Overall ADL's : Needs assistance/impaired                                       General ADL Comments: Pt MOD A t/f, assist for lift from toilet, multimodal cueing for hand positioning and planning.      Cognition Arousal/Alertness: Awake/alert Behavior During Therapy: Flat affect Overall Cognitive Status: History of cognitive impairments - at baseline                                          Exercises Exercises: Other exercises Other Exercises Other Exercises: Pt educated re: falls pcns Other Exercises: Sup<>sit, HOB elevated. Sit<>stand, toilet t/f.   Shoulder Instructions       General Comments      Pertinent Vitals/ Pain       Pain Assessment: No/denies pain  Home Living  Prior Functioning/Environment              Frequency  Min 2X/week        Progress Toward Goals  OT Goals(current goals can now be found in the care plan section)  Progress towards OT goals: Progressing toward goals  Acute Rehab OT Goals Patient Stated Goal: go to rehab OT Goal Formulation: With family Time For Goal Achievement: 01/18/21 Potential to Achieve Goals: Fair ADL Goals Pt Will Perform Grooming: with supervision;standing Pt Will Perform Lower Body Dressing: with supervision;sit to/from stand Pt Will Transfer to Toilet: with supervision;ambulating Pt Will Perform Toileting - Clothing Manipulation and hygiene: with supervision;sit to/from stand  Plan Discharge plan remains appropriate    Co-evaluation                 AM-PAC OT "6 Clicks" Daily Activity     Outcome Measure   Help from  another person eating meals?: None Help from another person taking care of personal grooming?: None Help from another person toileting, which includes using toliet, bedpan, or urinal?: A Lot Help from another person bathing (including washing, rinsing, drying)?: A Little Help from another person to put on and taking off regular upper body clothing?: None Help from another person to put on and taking off regular lower body clothing?: A Little 6 Click Score: 20    End of Session Equipment Utilized During Treatment: Rolling walker  OT Visit Diagnosis: Unsteadiness on feet (R26.81);Muscle weakness (generalized) (M62.81)   Activity Tolerance Patient tolerated treatment well   Patient Left in bed;with call bell/phone within reach;with bed alarm set   Nurse Communication          Time: 8757-9728 OT Time Calculation (min): 32 min  Charges: OT General Charges $OT Visit: 1 Visit OT Treatments $Self Care/Home Management : 23-37 mins  Nino Glow, Markus Daft 01/07/2021, 3:55 PM

## 2021-01-08 DIAGNOSIS — G9341 Metabolic encephalopathy: Secondary | ICD-10-CM | POA: Diagnosis not present

## 2021-01-08 DIAGNOSIS — F03C11 Unspecified dementia, severe, with agitation: Secondary | ICD-10-CM | POA: Diagnosis not present

## 2021-01-08 DIAGNOSIS — J690 Pneumonitis due to inhalation of food and vomit: Secondary | ICD-10-CM | POA: Diagnosis not present

## 2021-01-08 LAB — GLUCOSE, CAPILLARY
Glucose-Capillary: 150 mg/dL — ABNORMAL HIGH (ref 70–99)
Glucose-Capillary: 190 mg/dL — ABNORMAL HIGH (ref 70–99)
Glucose-Capillary: 197 mg/dL — ABNORMAL HIGH (ref 70–99)
Glucose-Capillary: 175 mg/dL — ABNORMAL HIGH (ref 70–99)

## 2021-01-08 MED ORDER — FLUTICASONE FUROATE-VILANTEROL 100-25 MCG/ACT IN AEPB
1.0000 | INHALATION_SPRAY | Freq: Every day | RESPIRATORY_TRACT | Status: DC
  Administered 2021-01-08 – 2021-01-18 (×11): 1 via RESPIRATORY_TRACT
  Filled 2021-01-08: qty 28

## 2021-01-08 MED ORDER — UMECLIDINIUM BROMIDE 62.5 MCG/ACT IN AEPB
1.0000 | INHALATION_SPRAY | Freq: Every day | RESPIRATORY_TRACT | Status: DC
  Administered 2021-01-08 – 2021-01-18 (×9): 1 via RESPIRATORY_TRACT
  Filled 2021-01-08 (×2): qty 7

## 2021-01-08 MED ORDER — ALBUTEROL SULFATE HFA 108 (90 BASE) MCG/ACT IN AERS
1.0000 | INHALATION_SPRAY | Freq: Four times a day (QID) | RESPIRATORY_TRACT | Status: DC | PRN
  Filled 2021-01-08: qty 6.7

## 2021-01-08 NOTE — Progress Notes (Signed)
PROGRESS NOTE    Todd Davidson  EQA:[id number] DOB: [birth year] DOA: 12/31/2020 PCP: Perrin Maltese, MD    Brief Narrative:   Todd Davidson is a 85 y.o. male with medical history significant for asthma/COPD, insulin-dependent diabetes mellitus, history of prostate cancer, dementia with behavioral disturbance, hypertension, who was brought to the hospital because of acute change in mental status/lethargy.  His blood sugar at home was 34.  He was also noted to have oxygen saturation of 80% associated with use of accessory respiratory muscles.  He was placed on 15 L/min oxygen via nonrebreather mask and transported to the emergency room.    He was admitted to the hospital for hypoglycemia, acute hypoxemic respiratory failure secondary to aspiration pneumonia and COPD exacerbation.  He was treated with BiPAP initially.  He was also treated with IV dextrose, IV antibiotics and bronchodilators.  Patient condition has improved, antibiotics completed.  However, patient was tested positive for COVID before transfer to nursing home, he need to be quarantined, transfer on 10/30.   Assessment & Plan:   Principal Problem:   Acute metabolic encephalopathy Active Problems:   Diabetes mellitus with hypoglycemia, with long-term current use of insulin (HCC)   Acute respiratory failure (HCC)   Aspiration pneumonia (HCC)   COPD with acute exacerbation (HCC)   Dementia (HCC)   DNR (do not resuscitate)   Palliative care by specialist   Hypokalemia  Acute hypoxemic respiratory failure secondary to aspiration pneumonia. Aspiration pneumonia due to dysphagia. Dysphagia. COPD exacerbation. Patient had a significant respiratory distress at the time of admission, he was initially requiring BiPAP. Patient so far had improved.  He has completed antibiotics for aspiration pneumonia. Been seen by speech therapy, currently on dysphagia 1 diet with nectar thick liquid. Patient condition had improved,  respiratory symptom resolved.  Off oxygen.  Currently pending for nursing home transfer.   Acute metabolic encephalopathy. Baseline dementia. Condition much improved, sleeping better with Seroquel.   Type 2 diabetes with hypoglycemia. Hypoglycemia has resolved.   Tremor. Severe debility. History of left renal cell carcinoma. History of prostate cancer. Hypertension. Patient is followed by palliative care for poor prognosis   DVT prophylaxis: Lovenox Code Status: DNR Family Communication: Brother updated. Disposition Plan:      Status is: Inpatient   Remains inpatient appropriate because: Unsafe discharge plan.  Pending transfer to nursing home after quarantine for 10 days        I/O last 3 completed shifts: In: 240 [P.O.:240] Out: 1100 [Urine:1100] No intake/output data recorded.   Consultants:  None   Procedures: None   Antimicrobials: None      Subjective: Spoke with patient and brother, patient is doing much better today.  He is eating better, he started talking to his brother. Does not have any short of breath or cough. No fever or chills. No abdominal pain or nausea vomiting.  Objective: Vitals:   01/07/21 1532 01/07/21 2015 01/07/21 2016 01/08/21 0427  BP: (!) 143/76 139/85  (!) 165/70  Pulse: 71 (!) 111 79 62  Resp: 18 16  20   Temp: 99.8 F (37.7 C) 98.1 F (36.7 C)  (!) 97.4 F (36.3 C)  TempSrc: Oral Oral  Oral  SpO2: 95% 93% 95% 97%  Weight:      Height:        Intake/Output Summary (Last 24 hours) at 01/08/2021 1259 Last data filed at 01/08/2021 0438 Gross per 24 hour  Intake 240 ml  Output 1100 ml  Net -860  ml   Filed Weights   12/31/20 0907 01/03/21 0623 01/06/21 0739  Weight: 72.6 kg 67.7 kg 65.4 kg    Examination:  General exam: Appears calm and comfortable  Respiratory system: Clear to auscultation. Respiratory effort normal. Cardiovascular system: S1 & S2 heard, RRR. No JVD, murmurs, rubs, gallops or clicks. No  pedal edema. Gastrointestinal system: Abdomen is nondistended, soft and nontender. No organomegaly or masses felt. Normal bowel sounds heard. Central nervous system: Alert and oriented x1. No focal neurological deficits. Extremities: Symmetric 5 x 5 power. Skin: No rashes, lesions or ulcers Psychiatry: Mood & affect appropriate.     Data Reviewed: I have personally reviewed following labs and imaging studies  CBC: Recent Labs  Lab 01/02/21 0451 01/03/21 0528 01/07/21 0515  WBC 10.3 8.8 5.8  NEUTROABS 7.8* 6.4  --   HGB 14.5 14.5 15.7  HCT 41.0 42.4 45.6  MCV 86.7 87.2 89.8  PLT 129* 126* 387*   Basic Metabolic Panel: Recent Labs  Lab 01/02/21 0451 01/03/21 0528 01/04/21 0743 01/07/21 0515  NA 142 142 144  --   K 3.5 3.2* 3.6  --   CL 104 103 105  --   CO2 28 28 23   --   GLUCOSE 120* 138* 145*  --   BUN 13 14 14   --   CREATININE 0.93 0.87 0.94 1.03  CALCIUM 9.0 9.0 9.1  --   MG  --  1.9 1.8  --    GFR: Estimated Creatinine Clearance: 51.1 mL/min (by C-G formula based on SCr of 1.03 mg/dL). Liver Function Tests: No results for input(s): AST, ALT, ALKPHOS, BILITOT, PROT, ALBUMIN in the last 168 hours. No results for input(s): LIPASE, AMYLASE in the last 168 hours. No results for input(s): AMMONIA in the last 168 hours. Coagulation Profile: No results for input(s): INR, PROTIME in the last 168 hours. Cardiac Enzymes: No results for input(s): CKTOTAL, CKMB, CKMBINDEX, TROPONINI in the last 168 hours. BNP (last 3 results) No results for input(s): PROBNP in the last 8760 hours. HbA1C: No results for input(s): HGBA1C in the last 72 hours. CBG: Recent Labs  Lab 01/07/21 1223 01/07/21 1529 01/07/21 2023 01/08/21 0830 01/08/21 1215  GLUCAP 176* 176* 143* 150* 190*   Lipid Profile: No results for input(s): CHOL, HDL, LDLCALC, TRIG, CHOLHDL, LDLDIRECT in the last 72 hours. Thyroid Function Tests: No results for input(s): TSH, T4TOTAL, FREET4, T3FREE, THYROIDAB  in the last 72 hours. Anemia Panel: No results for input(s): VITAMINB12, FOLATE, FERRITIN, TIBC, IRON, RETICCTPCT in the last 72 hours. Sepsis Labs: No results for input(s): PROCALCITON, LATICACIDVEN in the last 168 hours.  Recent Results (from the past 240 hour(s))  Resp Panel by RT-PCR (Flu A&B, Covid) Nasopharyngeal Swab     Status: None   Collection Time: 12/31/20  9:03 AM   Specimen: Nasopharyngeal Swab; Nasopharyngeal(NP) swabs in vial transport medium  Result Value Ref Range Status   SARS Coronavirus 2 by RT PCR NEGATIVE NEGATIVE Final    Comment: (NOTE) SARS-CoV-2 target nucleic acids are NOT DETECTED.  The SARS-CoV-2 RNA is generally detectable in upper respiratory specimens during the acute phase of infection. The lowest concentration of SARS-CoV-2 viral copies this assay can detect is 138 copies/mL. A negative result does not preclude SARS-Cov-2 infection and should not be used as the sole basis for treatment or other patient management decisions. A negative result may occur with  improper specimen collection/handling, submission of specimen other than nasopharyngeal swab, presence of viral mutation(s) within the  areas targeted by this assay, and inadequate number of viral copies(<138 copies/mL). A negative result must be combined with clinical observations, patient history, and epidemiological information. The expected result is Negative.  Fact Sheet for Patients:  EntrepreneurPulse.com.au  Fact Sheet for Healthcare Providers:  IncredibleEmployment.be  This test is no t yet approved or cleared by the [place] FDA and  has been authorized for detection and/or diagnosis of SARS-CoV-2 by FDA under an Emergency Use Authorization (EUA). This EUA will remain  in effect (meaning this test can be used) for the duration of the COVID-19 declaration under Section 564(b)(1) of the Act, 21 U.S.C.section 360bbb-3(b)(1), unless the  authorization is terminated  or revoked sooner.       Influenza A by PCR NEGATIVE NEGATIVE Final   Influenza B by PCR NEGATIVE NEGATIVE Final    Comment: (NOTE) The Xpert Xpress SARS-CoV-2/FLU/RSV plus assay is intended as an aid in the diagnosis of influenza from Nasopharyngeal swab specimens and should not be used as a sole basis for treatment. Nasal washings and aspirates are unacceptable for Xpert Xpress SARS-CoV-2/FLU/RSV testing.  Fact Sheet for Patients: EntrepreneurPulse.com.au  Fact Sheet for Healthcare Providers: IncredibleEmployment.be  This test is not yet approved or cleared by the [place] FDA and has been authorized for detection and/or diagnosis of SARS-CoV-2 by FDA under an Emergency Use Authorization (EUA). This EUA will remain in effect (meaning this test can be used) for the duration of the COVID-19 declaration under Section 564(b)(1) of the Act, 21 U.S.C. section 360bbb-3(b)(1), unless the authorization is terminated or revoked.  Performed at [facility], [address] [place], [place] [address]   Resp Panel by RT-PCR (Flu A&B, Covid) Nasopharyngeal Swab     Status: Abnormal   Collection Time: 01/07/21 10:20 AM   Specimen: Nasopharyngeal Swab; Nasopharyngeal(NP) swabs in vial transport medium  Result Value Ref Range Status   SARS Coronavirus 2 by RT PCR POSITIVE (A) NEGATIVE Final    Comment: RESULT CALLED TO, READ BACK BY AND VERIFIED WITH: Zonia Kief, RN [id number] 01/07/21 GM (NOTE) SARS-CoV-2 target nucleic acids are DETECTED.  The SARS-CoV-2 RNA is generally detectable in upper respiratory specimens during the acute phase of infection. Positive results are indicative of the presence of the identified virus, but do not rule out bacterial infection or co-infection with other pathogens not detected by the test. Clinical correlation with patient history and other diagnostic information is necessary to  determine patient infection status. The expected result is Negative.  Fact Sheet for Patients: EntrepreneurPulse.com.au  Fact Sheet for Healthcare Providers: IncredibleEmployment.be  This test is not yet approved or cleared by the [place] FDA and  has been authorized for detection and/or diagnosis of SARS-CoV-2 by FDA under an Emergency Use Authorization (EUA).  This EUA will remain in effect (meaning this test can be u sed) for the duration of  the COVID-19 declaration under Section 564(b)(1) of the Act, 21 U.S.C. section 360bbb-3(b)(1), unless the authorization is terminated or revoked sooner.     Influenza A by PCR NEGATIVE NEGATIVE Final   Influenza B by PCR NEGATIVE NEGATIVE Final    Comment: (NOTE) The Xpert Xpress SARS-CoV-2/FLU/RSV plus assay is intended as an aid in the diagnosis of influenza from Nasopharyngeal swab specimens and should not be used as a sole basis for treatment. Nasal washings and aspirates are unacceptable for Xpert Xpress SARS-CoV-2/FLU/RSV testing.  Fact Sheet for Patients: EntrepreneurPulse.com.au  Fact Sheet for Healthcare Providers: IncredibleEmployment.be  This test is not yet  approved or cleared by the [place] and has been authorized for detection and/or diagnosis of SARS-CoV-2 by FDA under an Emergency Use Authorization (EUA). This EUA will remain in effect (meaning this test can be used) for the duration of the COVID-19 declaration under Section 564(b)(1) of the Act, 21 U.S.C. section 360bbb-3(b)(1), unless the authorization is terminated or revoked.  Performed at [facility], [address], [place], [place] [address]          Radiology Studies: No results found.      Scheduled Meds:  amLODipine  10 mg Oral Daily   aspirin  300 mg Rectal Daily   benazepril  40 mg Oral QHS   citalopram  10 mg Oral QHS   enoxaparin (LOVENOX)  injection  40 mg Subcutaneous Q24H   finasteride  5 mg Oral Daily   fluticasone furoate-vilanterol  1 puff Inhalation Daily   And   umeclidinium bromide  1 puff Inhalation Daily   gabapentin  300 mg Oral BID   QUEtiapine  25 mg Oral QHS   tamsulosin  0.4 mg Oral Daily   Continuous Infusions:   LOS: 8 days    Time spent: 21 minutes    Sharen Hones, MD [facility]   To contact the attending provider between 7A-7P or the covering provider during after hours 7P-7A, please log into the web site [email or web address] and access using universal [place] password for that web site. If you do not have the password, please call the hospital operator.  01/08/2021, 12:59 PM

## 2021-01-08 NOTE — Plan of Care (Signed)

## 2021-01-08 NOTE — Progress Notes (Signed)
Physical Therapy Treatment Patient Details Name: Todd Davidson MRN: [id number] DOB: 07/20/1935 Today's Date: 01/08/2021   History of Present Illness Pt is an 85 y.o. M arriving to ED after being found on the floor by a family member and admitted for metabolic encephalopahty, acute respiratory failure, and aspiration pneumonia. PMH includes DM, hx prostate cancer, hx of dementia, COPD,    PT Comments    Pt received supine in bed, tremor in RUE, agreeable to therapy. Pt performed bed mobility with SUP for safety and increased time. PT elevated EOB for STS, pt required MOD A to lift, shift weight anterior over BOS and steady. PT placed RUE on RW prior to stand and held it in place due to tremor increasing with fatigue/activity. Pt ambulated 76ft followed by 29ft within room. PT directed pt where to walk - pt confused on bathroom vs cabinet. Pt in chair at end of session, alarm activated and visitor entering room. Would benefit from skilled PT to address above deficits and promote optimal return to PLOF.    Recommendations for follow up therapy are one component of a multi-disciplinary discharge planning process, led by the attending physician.  Recommendations may be updated based on patient status, additional functional criteria and insurance authorization.  Follow Up Recommendations  SNF;Supervision for mobility/OOB;Supervision/Assistance - 24 hour     Equipment Recommendations  Other (comment)    Recommendations for Other Services       Precautions / Restrictions Precautions Precautions: Fall Restrictions Weight Bearing Restrictions: No     Mobility  Bed Mobility Overal bed mobility: Needs Assistance Bed Mobility: Supine to Sit     Supine to sit: Supervision     General bed mobility comments: increased time, VC to scoot forward    Transfers Overall transfer level: Needs assistance Equipment used: Rolling walker (2 wheeled) Transfers: Sit to/from Stand Sit to Stand:  Mod assist         General transfer comment: MOD A to lift, steady and place.maintain RUE on RW due to tremor  Ambulation/Gait Ambulation/Gait assistance: Min assist Gait Distance (Feet): 25 Feet Assistive device: Rolling walker (2 wheeled) Gait Pattern/deviations: Step-to pattern;Narrow base of support;Trunk flexed Gait velocity: decreased   General Gait Details: MIN A for increased steadying and assist to [employer] Mobility    Modified Rankin (Stroke Patients Only)       Balance Overall balance assessment: Needs assistance Sitting-balance support: Feet supported Sitting balance-Leahy Scale: Good     Standing balance support: During functional activity;Bilateral upper extremity supported Standing balance-Leahy Scale: Poor Standing balance comment: Requires BUE support on RW and CGA to steady                            Cognition Arousal/Alertness: Awake/alert Behavior During Therapy: Flat affect Overall Cognitive Status: History of cognitive impairments - at baseline                                 General Comments: Followed commands well.      Exercises Other Exercises Other Exercises: Ambulatory toilet transfer; assist with pericare in standing with CGA to maintain static standing balance.    General Comments        Pertinent Vitals/Pain Pain Assessment: No/denies pain    Home Living  Prior Function            PT Goals (current goals can now be found in the care plan section) Acute Rehab PT Goals Patient Stated Goal: go to rehab    Frequency    Min 2X/week      PT Plan      Co-evaluation              AM-PAC PT "6 Clicks" Mobility   Outcome Measure  Help needed turning from your back to your side while in a flat bed without using bedrails?: None Help needed moving from lying on your back to sitting on the side of a flat bed  without using bedrails?: A Todd Help needed moving to and from a bed to a chair (including a wheelchair)?: A Lot Help needed standing up from a chair using your arms (e.g., wheelchair or bedside chair)?: A Lot Help needed to walk in hospital room?: A Todd Help needed climbing 3-5 steps with a railing? : A Lot 6 Click Score: 16    End of Session Equipment Utilized During Treatment: Gait belt Activity Tolerance: Patient tolerated treatment well Patient left: in bed;with call bell/phone within reach;with family/visitor present;with bed alarm set Nurse Communication: Mobility status PT Visit Diagnosis: Other abnormalities of gait and mobility (R26.89);Muscle weakness (generalized) (M62.81);History of falling (Z91.81)     Time: 9179-1505 PT Time Calculation (min) (ACUTE ONLY): 28 min  Charges:  $Gait Training: 8-22 mins $Therapeutic Activity: 8-22 mins                     Patrina Levering PT, DPT 01/08/21 1:23 PM [phone]

## 2021-01-09 DIAGNOSIS — J9601 Acute respiratory failure with hypoxia: Secondary | ICD-10-CM | POA: Diagnosis not present

## 2021-01-09 DIAGNOSIS — J690 Pneumonitis due to inhalation of food and vomit: Secondary | ICD-10-CM | POA: Diagnosis not present

## 2021-01-09 DIAGNOSIS — G9341 Metabolic encephalopathy: Secondary | ICD-10-CM | POA: Diagnosis not present

## 2021-01-09 LAB — GLUCOSE, CAPILLARY
Glucose-Capillary: 170 mg/dL — ABNORMAL HIGH (ref 70–99)
Glucose-Capillary: 260 mg/dL — ABNORMAL HIGH (ref 70–99)
Glucose-Capillary: 145 mg/dL — ABNORMAL HIGH (ref 70–99)
Glucose-Capillary: 138 mg/dL — ABNORMAL HIGH (ref 70–99)

## 2021-01-09 MED ORDER — LACTULOSE 10 GM/15ML PO SOLN
20.0000 g | Freq: Once | ORAL | Status: AC
  Administered 2021-01-09: 20 g via ORAL
  Filled 2021-01-09: qty 30

## 2021-01-09 NOTE — Progress Notes (Signed)
PROGRESS NOTE    Todd Davidson  ACZ:[id number] DOB: [birth year] DOA: 12/31/2020 PCP: Perrin Maltese, MD    Brief Narrative:  Todd Davidson is a 85 y.o. male with medical history significant for asthma/COPD, insulin-dependent diabetes mellitus, history of prostate cancer, dementia with behavioral disturbance, hypertension, who was brought to the hospital because of acute change in mental status/lethargy.  His blood sugar at home was 34.  He was also noted to have oxygen saturation of 80% associated with use of accessory respiratory muscles.  He was placed on 15 L/min oxygen via nonrebreather mask and transported to the emergency room.    He was admitted to the hospital for hypoglycemia, acute hypoxemic respiratory failure secondary to aspiration pneumonia and COPD exacerbation.  He was treated with BiPAP initially.  He was also treated with IV dextrose, IV antibiotics and bronchodilators.   Patient condition has improved, antibiotics completed.  However, patient was tested positive for COVID before transfer to nursing home, he need to be quarantined, transfer on 10/30.    Assessment & Plan:   Principal Problem:   Acute metabolic encephalopathy Active Problems:   Diabetes mellitus with hypoglycemia, with long-term current use of insulin (HCC)   Acute respiratory failure (HCC)   Aspiration pneumonia (HCC)   COPD with acute exacerbation (HCC)   Dementia (HCC)   DNR (do not resuscitate)   Palliative care by specialist   Hypokalemia  Acute hypoxemic respiratory failure secondary to aspiration pneumonia. Aspiration pneumonia due to dysphagia. Dysphagia. COPD exacerbation. Condition had improved, currently off oxygen.   Acute metabolic encephalopathy. Baseline dementia. Patient back to baseline.  Type 2 diabetes with hypoglycemia. No additional hypoglycemia.  Tremor. Severe debility. History of left renal cell carcinoma. History of prostate cancer. Hypertension. Patient is  followed by palliative care for poor prognosis     DVT prophylaxis: Lovenox Code Status: DNR Family Communication: Brother updated. Disposition Plan:      Status is: Inpatient   Remains inpatient appropriate because: Unsafe discharge plan.  Pending transfer to nursing home after quarantine for 10 days          I/O last 3 completed shifts: In: 240 [P.O.:240] Out: 1350 [Urine:1350] Total I/O In: 100 [P.O.:100] Out: -    Consultants:  None   Procedures: None   Antimicrobials: None     Subjective: Patient condition stable, currently has no complaints.  He was able to eat, he was able to talk a few sentence.  Still have a significant tremor. Denies any short of breath or cough. No abdominal pain or nausea vomiting.  Objective: Vitals:   01/08/21 1709 01/08/21 2118 01/09/21 0440 01/09/21 0802  BP: 117/65 138/79 130/77 135/78  Pulse: 67 60 (!) 50 (!) 57  Resp: 18 18 18 20   Temp: 97.6 F (36.4 C) 97.7 F (36.5 C) 97.8 F (36.6 C) 97.9 F (36.6 C)  TempSrc:  Oral Oral Oral  SpO2: 97% 96% 94% 96%  Weight:      Height:        Intake/Output Summary (Last 24 hours) at 01/09/2021 1402 Last data filed at 01/09/2021 1036 Gross per 24 hour  Intake 100 ml  Output 250 ml  Net -150 ml   Filed Weights   12/31/20 0907 01/03/21 0623 01/06/21 0739  Weight: 72.6 kg 67.7 kg 65.4 kg    Examination:  General exam: Appears calm and comfortable  Respiratory system: Clear to auscultation. Respiratory effort normal. Cardiovascular system: S1 & S2 heard, RRR. No JVD, murmurs,  rubs, gallops or clicks. No pedal edema. Gastrointestinal system: Abdomen is nondistended, soft and nontender. No organomegaly or masses felt. Normal bowel sounds heard. Central nervous system: Alert and oriented x2. No focal neurological deficits. Extremities: Symmetric 5 x 5 power. Skin: No rashes, lesions or ulcers Psychiatry: Mood & affect appropriate.     Data Reviewed: I have personally  reviewed following labs and imaging studies  CBC: Recent Labs  Lab 01/03/21 0528 01/07/21 0515  WBC 8.8 5.8  NEUTROABS 6.4  --   HGB 14.5 15.7  HCT 42.4 45.6  MCV 87.2 89.8  PLT 126* 509*   Basic Metabolic Panel: Recent Labs  Lab 01/03/21 0528 01/04/21 0743 01/07/21 0515  NA 142 144  --   K 3.2* 3.6  --   CL 103 105  --   CO2 28 23  --   GLUCOSE 138* 145*  --   BUN 14 14  --   CREATININE 0.87 0.94 1.03  CALCIUM 9.0 9.1  --   MG 1.9 1.8  --    GFR: Estimated Creatinine Clearance: 51.1 mL/min (by C-G formula based on SCr of 1.03 mg/dL). Liver Function Tests: No results for input(s): AST, ALT, ALKPHOS, BILITOT, PROT, ALBUMIN in the last 168 hours. No results for input(s): LIPASE, AMYLASE in the last 168 hours. No results for input(s): AMMONIA in the last 168 hours. Coagulation Profile: No results for input(s): INR, PROTIME in the last 168 hours. Cardiac Enzymes: No results for input(s): CKTOTAL, CKMB, CKMBINDEX, TROPONINI in the last 168 hours. BNP (last 3 results) No results for input(s): PROBNP in the last 8760 hours. HbA1C: No results for input(s): HGBA1C in the last 72 hours. CBG: Recent Labs  Lab 01/08/21 1215 01/08/21 1705 01/08/21 2126 01/09/21 0758 01/09/21 1204  GLUCAP 190* 197* 175* 170* 260*   Lipid Profile: No results for input(s): CHOL, HDL, LDLCALC, TRIG, CHOLHDL, LDLDIRECT in the last 72 hours. Thyroid Function Tests: No results for input(s): TSH, T4TOTAL, FREET4, T3FREE, THYROIDAB in the last 72 hours. Anemia Panel: No results for input(s): VITAMINB12, FOLATE, FERRITIN, TIBC, IRON, RETICCTPCT in the last 72 hours. Sepsis Labs: No results for input(s): PROCALCITON, LATICACIDVEN in the last 168 hours.  Recent Results (from the past 240 hour(s))  Resp Panel by RT-PCR (Flu A&B, Covid) Nasopharyngeal Swab     Status: None   Collection Time: 12/31/20  9:03 AM   Specimen: Nasopharyngeal Swab; Nasopharyngeal(NP) swabs in vial transport medium   Result Value Ref Range Status   SARS Coronavirus 2 by RT PCR NEGATIVE NEGATIVE Final    Comment: (NOTE) SARS-CoV-2 target nucleic acids are NOT DETECTED.  The SARS-CoV-2 RNA is generally detectable in upper respiratory specimens during the acute phase of infection. The lowest concentration of SARS-CoV-2 viral copies this assay can detect is 138 copies/mL. A negative result does not preclude SARS-Cov-2 infection and should not be used as the sole basis for treatment or other patient management decisions. A negative result may occur with  improper specimen collection/handling, submission of specimen other than nasopharyngeal swab, presence of viral mutation(s) within the areas targeted by this assay, and inadequate number of viral copies(<138 copies/mL). A negative result must be combined with clinical observations, patient history, and epidemiological information. The expected result is Negative.  Fact Sheet for Patients:  EntrepreneurPulse.com.au  Fact Sheet for Healthcare Providers:  IncredibleEmployment.be  This test is no t yet approved or cleared by the [place] FDA and  has been authorized for detection and/or diagnosis of SARS-CoV-2  by FDA under an Emergency Use Authorization (EUA). This EUA will remain  in effect (meaning this test can be used) for the duration of the COVID-19 declaration under Section 564(b)(1) of the Act, 21 U.S.C.section 360bbb-3(b)(1), unless the authorization is terminated  or revoked sooner.       Influenza A by PCR NEGATIVE NEGATIVE Final   Influenza B by PCR NEGATIVE NEGATIVE Final    Comment: (NOTE) The Xpert Xpress SARS-CoV-2/FLU/RSV plus assay is intended as an aid in the diagnosis of influenza from Nasopharyngeal swab specimens and should not be used as a sole basis for treatment. Nasal washings and aspirates are unacceptable for Xpert Xpress SARS-CoV-2/FLU/RSV testing.  Fact Sheet for  Patients: EntrepreneurPulse.com.au  Fact Sheet for Healthcare Providers: IncredibleEmployment.be  This test is not yet approved or cleared by the [place] FDA and has been authorized for detection and/or diagnosis of SARS-CoV-2 by FDA under an Emergency Use Authorization (EUA). This EUA will remain in effect (meaning this test can be used) for the duration of the COVID-19 declaration under Section 564(b)(1) of the Act, 21 U.S.C. section 360bbb-3(b)(1), unless the authorization is terminated or revoked.  Performed at [facility], [address] [place], [place] [address]   Resp Panel by RT-PCR (Flu A&B, Covid) Nasopharyngeal Swab     Status: Abnormal   Collection Time: 01/07/21 10:20 AM   Specimen: Nasopharyngeal Swab; Nasopharyngeal(NP) swabs in vial transport medium  Result Value Ref Range Status   SARS Coronavirus 2 by RT PCR POSITIVE (A) NEGATIVE Final    Comment: RESULT CALLED TO, READ BACK BY AND VERIFIED WITH: Zonia Kief, RN [phone] 01/07/21 GM (NOTE) SARS-CoV-2 target nucleic acids are DETECTED.  The SARS-CoV-2 RNA is generally detectable in upper respiratory specimens during the acute phase of infection. Positive results are indicative of the presence of the identified virus, but do not rule out bacterial infection or co-infection with other pathogens not detected by the test. Clinical correlation with patient history and other diagnostic information is necessary to determine patient infection status. The expected result is Negative.  Fact Sheet for Patients: EntrepreneurPulse.com.au  Fact Sheet for Healthcare Providers: IncredibleEmployment.be  This test is not yet approved or cleared by the [place] FDA and  has been authorized for detection and/or diagnosis of SARS-CoV-2 by FDA under an Emergency Use Authorization (EUA).  This EUA will remain in effect (meaning this test can  be u sed) for the duration of  the COVID-19 declaration under Section 564(b)(1) of the Act, 21 U.S.C. section 360bbb-3(b)(1), unless the authorization is terminated or revoked sooner.     Influenza A by PCR NEGATIVE NEGATIVE Final   Influenza B by PCR NEGATIVE NEGATIVE Final    Comment: (NOTE) The Xpert Xpress SARS-CoV-2/FLU/RSV plus assay is intended as an aid in the diagnosis of influenza from Nasopharyngeal swab specimens and should not be used as a sole basis for treatment. Nasal washings and aspirates are unacceptable for Xpert Xpress SARS-CoV-2/FLU/RSV testing.  Fact Sheet for Patients: EntrepreneurPulse.com.au  Fact Sheet for Healthcare Providers: IncredibleEmployment.be  This test is not yet approved or cleared by the [place] FDA and has been authorized for detection and/or diagnosis of SARS-CoV-2 by FDA under an Emergency Use Authorization (EUA). This EUA will remain in effect (meaning this test can be used) for the duration of the COVID-19 declaration under Section 564(b)(1) of the Act, 21 U.S.C. section 360bbb-3(b)(1), unless the authorization is terminated or revoked.  Performed at [facility], [address],  [place], [place] [address]          Radiology Studies: No results found.      Scheduled Meds:  amLODipine  10 mg Oral Daily   aspirin  300 mg Rectal Daily   benazepril  40 mg Oral QHS   citalopram  10 mg Oral QHS   enoxaparin (LOVENOX) injection  40 mg Subcutaneous Q24H   finasteride  5 mg Oral Daily   fluticasone furoate-vilanterol  1 puff Inhalation Daily   And   umeclidinium bromide  1 puff Inhalation Daily   gabapentin  300 mg Oral BID   QUEtiapine  25 mg Oral QHS   tamsulosin  0.4 mg Oral Daily   Continuous Infusions:   LOS: 9 days    Time spent: 24 minutes    Sharen Hones, MD [facility]   To contact the attending provider between 7A-7P or the covering provider  during after hours 7P-7A, please log into the web site [email or web address] and access using universal [place] password for that web site. If you do not have the password, please call the hospital operator.  01/09/2021, 2:02 PM

## 2021-01-10 DIAGNOSIS — J9601 Acute respiratory failure with hypoxia: Secondary | ICD-10-CM | POA: Diagnosis not present

## 2021-01-10 DIAGNOSIS — G9341 Metabolic encephalopathy: Secondary | ICD-10-CM | POA: Diagnosis not present

## 2021-01-10 DIAGNOSIS — J690 Pneumonitis due to inhalation of food and vomit: Secondary | ICD-10-CM | POA: Diagnosis not present

## 2021-01-10 LAB — GLUCOSE, CAPILLARY: Glucose-Capillary: 238 mg/dL — ABNORMAL HIGH (ref 70–99)

## 2021-01-10 NOTE — Progress Notes (Signed)
PROGRESS NOTE    Todd Davidson  ZCH:[id number] DOB: 1935/12/26 DOA: 12/31/2020 PCP: Perrin Maltese, MD    Brief Narrative:   Todd Davidson is a 85 y.o. male with medical history significant for asthma/COPD, insulin-dependent diabetes mellitus, history of prostate cancer, dementia with behavioral disturbance, hypertension, who was brought to the hospital because of acute change in mental status/lethargy.  His blood sugar at home was 34.  He was also noted to have oxygen saturation of 80% associated with use of accessory respiratory muscles.  He was placed on 15 L/min oxygen via nonrebreather mask and transported to the emergency room.    He was admitted to the hospital for hypoglycemia, acute hypoxemic respiratory failure secondary to aspiration pneumonia and COPD exacerbation.  He was treated with BiPAP initially.  He was also treated with IV dextrose, IV antibiotics and bronchodilators.   Patient condition has improved, antibiotics completed.  However, patient was tested positive for COVID before transfer to nursing home, he need to be quarantined, transfer on 10/30.   Assessment & Plan:   Principal Problem:   Acute metabolic encephalopathy Active Problems:   Thrombocytopenia ([place])   Diabetes mellitus with hypoglycemia, with long-term current use of insulin (HCC)   Acute respiratory failure ([place])   Aspiration pneumonia ([place])   COPD with acute exacerbation ([place])   Dementia ([place])   DNR (do not resuscitate)   Palliative care by specialist   Hypokalemia  Acute hypoxemic respiratory failure secondary to aspiration pneumonia. Aspiration pneumonia due to dysphagia. Dysphagia. COPD exacerbation. No additional problems, eating better.     Acute metabolic encephalopathy. Baseline dementia. Patient back to baseline.  Type 2 diabetes with hypoglycemia. Improved.  Tremor. Severe debility. History of left renal cell carcinoma. History of prostate  cancer. Hypertension.           I/O last 3 completed shifts: In: 340 [P.O.:340] Out: 750 [Urine:750] No intake/output data recorded.      Subjective: Per patient son, patient appetite is well.  Still has significant tremor. Baseline confusion. No short of breath or cough. No fever or chills. No dysuria hematuria.  Objective: Vitals:   01/09/21 0802 01/09/21 1921 01/10/21 0502 01/10/21 0738  BP: 135/78 133/78 122/77 (!) 143/75  Pulse: (!) 57 72 (!) 58 (!) 54  Resp: 20 18 19 12   Temp: 97.9 F (36.6 C) 97.7 F (36.5 C) 97.7 F (36.5 C) 97.8 F (36.6 C)  TempSrc: Oral Oral Oral Oral  SpO2: 96% 100% 97% 96%  Weight:      Height:        Intake/Output Summary (Last 24 hours) at 01/10/2021 1304 Last data filed at 01/10/2021 0500 Gross per 24 hour  Intake 240 ml  Output 500 ml  Net -260 ml   Filed Weights   12/31/20 0907 01/03/21 0623 01/06/21 0739  Weight: 72.6 kg 67.7 kg 65.4 kg    Examination:  General exam: Appears calm and comfortable  Respiratory system: Clear to auscultation. Respiratory effort normal. Cardiovascular system: S1 & S2 heard, RRR. No JVD, murmurs, rubs, gallops or clicks. No pedal edema. Gastrointestinal system: Abdomen is nondistended, soft and nontender. No organomegaly or masses felt. Normal bowel sounds heard. Central nervous system: Alert and oriented x1. No focal neurological deficits. Extremities: Symmetric 5 x 5 power. Skin: No rashes, lesions or ulcers    Data Reviewed: I have personally reviewed following labs and imaging studies  CBC: Recent Labs  Lab 01/07/21 0515  WBC 5.8  HGB 15.7  HCT 45.6  MCV 89.8  PLT 169*   Basic Metabolic Panel: Recent Labs  Lab 01/04/21 0743 01/07/21 0515  NA 144  --   K 3.6  --   CL 105  --   CO2 23  --   GLUCOSE 145*  --   BUN 14  --   CREATININE 0.94 1.03  CALCIUM 9.1  --   MG 1.8  --    GFR: Estimated Creatinine Clearance: 51.1 mL/min (by C-G formula based on SCr of  1.03 mg/dL). Liver Function Tests: No results for input(s): AST, ALT, ALKPHOS, BILITOT, PROT, ALBUMIN in the last 168 hours. No results for input(s): LIPASE, AMYLASE in the last 168 hours. No results for input(s): AMMONIA in the last 168 hours. Coagulation Profile: No results for input(s): INR, PROTIME in the last 168 hours. Cardiac Enzymes: No results for input(s): CKTOTAL, CKMB, CKMBINDEX, TROPONINI in the last 168 hours. BNP (last 3 results) No results for input(s): PROBNP in the last 8760 hours. HbA1C: No results for input(s): HGBA1C in the last 72 hours. CBG: Recent Labs  Lab 01/08/21 2126 01/09/21 0758 01/09/21 1204 01/09/21 1737 01/09/21 2114  GLUCAP 175* 170* 260* 145* 138*   Lipid Profile: No results for input(s): CHOL, HDL, LDLCALC, TRIG, CHOLHDL, LDLDIRECT in the last 72 hours. Thyroid Function Tests: No results for input(s): TSH, T4TOTAL, FREET4, T3FREE, THYROIDAB in the last 72 hours. Anemia Panel: No results for input(s): VITAMINB12, FOLATE, FERRITIN, TIBC, IRON, RETICCTPCT in the last 72 hours. Sepsis Labs: No results for input(s): PROCALCITON, LATICACIDVEN in the last 168 hours.  Recent Results (from the past 240 hour(s))  Resp Panel by RT-PCR (Flu A&B, Covid) Nasopharyngeal Swab     Status: Abnormal   Collection Time: 01/07/21 10:20 AM   Specimen: Nasopharyngeal Swab; Nasopharyngeal(NP) swabs in vial transport medium  Result Value Ref Range Status   SARS Coronavirus 2 by RT PCR POSITIVE (A) NEGATIVE Final    Comment: RESULT CALLED TO, READ BACK BY AND VERIFIED WITH: Zonia Kief, RN [phone] 01/07/21 GM (NOTE) SARS-CoV-2 target nucleic acids are DETECTED.  The SARS-CoV-2 RNA is generally detectable in upper respiratory specimens during the acute phase of infection. Positive results are indicative of the presence of the identified virus, but do not rule out bacterial infection or co-infection with other pathogens not detected by the test. Clinical correlation  with patient history and other diagnostic information is necessary to determine patient infection status. The expected result is Negative.  Fact Sheet for Patients: EntrepreneurPulse.com.au  Fact Sheet for Healthcare Providers: IncredibleEmployment.be  This test is not yet approved or cleared by the [place] FDA and  has been authorized for detection and/or diagnosis of SARS-CoV-2 by FDA under an Emergency Use Authorization (EUA).  This EUA will remain in effect (meaning this test can be u sed) for the duration of  the COVID-19 declaration under Section 564(b)(1) of the Act, 21 U.S.C. section 360bbb-3(b)(1), unless the authorization is terminated or revoked sooner.     Influenza A by PCR NEGATIVE NEGATIVE Final   Influenza B by PCR NEGATIVE NEGATIVE Final    Comment: (NOTE) The Xpert Xpress SARS-CoV-2/FLU/RSV plus assay is intended as an aid in the diagnosis of influenza from Nasopharyngeal swab specimens and should not be used as a sole basis for treatment. Nasal washings and aspirates are unacceptable for Xpert Xpress SARS-CoV-2/FLU/RSV testing.  Fact Sheet for Patients: EntrepreneurPulse.com.au  Fact Sheet for Healthcare Providers: IncredibleEmployment.be  This test is not yet approved or cleared by  the [employer] and has been authorized for detection and/or diagnosis of SARS-CoV-2 by FDA under an Emergency Use Authorization (EUA). This EUA will remain in effect (meaning this test can be used) for the duration of the COVID-19 declaration under Section 564(b)(1) of the Act, 21 U.S.C. section 360bbb-3(b)(1), unless the authorization is terminated or revoked.  Performed at [facility], [address] [place], [place] [address]          Radiology Studies: No results found.      Scheduled Meds:  amLODipine  10 mg Oral Daily   aspirin  300 mg Rectal Daily    benazepril  40 mg Oral QHS   citalopram  10 mg Oral QHS   enoxaparin (LOVENOX) injection  40 mg Subcutaneous Q24H   finasteride  5 mg Oral Daily   fluticasone furoate-vilanterol  1 puff Inhalation Daily   And   umeclidinium bromide  1 puff Inhalation Daily   gabapentin  300 mg Oral BID   QUEtiapine  25 mg Oral QHS   tamsulosin  0.4 mg Oral Daily   Continuous Infusions:   LOS: 10 days    Time spent: 27 minutes    Sharen Hones, MD [facility]   To contact the attending provider between 7A-7P or the covering provider during after hours 7P-7A, please log into the web site [email or web address] and access using universal [place] password for that web site. If you do not have the password, please call the hospital operator.  01/10/2021, 1:04 PM

## 2021-01-11 DIAGNOSIS — J9601 Acute respiratory failure with hypoxia: Secondary | ICD-10-CM | POA: Diagnosis not present

## 2021-01-11 DIAGNOSIS — J690 Pneumonitis due to inhalation of food and vomit: Secondary | ICD-10-CM | POA: Diagnosis not present

## 2021-01-11 DIAGNOSIS — U071 COVID-19: Secondary | ICD-10-CM

## 2021-01-11 DIAGNOSIS — G9341 Metabolic encephalopathy: Secondary | ICD-10-CM | POA: Diagnosis not present

## 2021-01-11 LAB — HEMOGLOBIN A1C
Hgb A1c MFr Bld: 7.1 % — ABNORMAL HIGH (ref 4.8–5.6)
Mean Plasma Glucose: 157.07 mg/dL

## 2021-01-11 LAB — GLUCOSE, CAPILLARY
Glucose-Capillary: 207 mg/dL — ABNORMAL HIGH (ref 70–99)
Glucose-Capillary: 256 mg/dL — ABNORMAL HIGH (ref 70–99)
Glucose-Capillary: 330 mg/dL — ABNORMAL HIGH (ref 70–99)
Glucose-Capillary: 205 mg/dL — ABNORMAL HIGH (ref 70–99)

## 2021-01-11 MED ORDER — INSULIN GLARGINE-YFGN 100 UNIT/ML ~~LOC~~ SOLN
16.0000 [IU] | Freq: Every day | SUBCUTANEOUS | Status: DC
  Administered 2021-01-11 – 2021-01-13 (×3): 16 [IU] via SUBCUTANEOUS
  Filled 2021-01-11 (×5): qty 0.16

## 2021-01-11 MED ORDER — INSULIN ASPART 100 UNIT/ML IJ SOLN: 0.0000 [IU] | Freq: Three times a day (TID) | INTRAMUSCULAR | Status: DC

## 2021-01-11 MED ORDER — INSULIN ASPART 100 UNIT/ML IJ SOLN
0.0000 [IU] | Freq: Every day | INTRAMUSCULAR | Status: DC
  Administered 2021-01-11 – 2021-01-16 (×5): 2 [IU] via SUBCUTANEOUS
  Filled 2021-01-11 (×5): qty 1

## 2021-01-11 MED ORDER — LIDOCAINE 5 % EX PTCH
1.0000 | MEDICATED_PATCH | CUTANEOUS | Status: DC
  Administered 2021-01-11 – 2021-01-17 (×7): 1 via TRANSDERMAL
  Filled 2021-01-11 (×8): qty 1

## 2021-01-11 MED ORDER — INSULIN ASPART 100 UNIT/ML IJ SOLN
INTRAMUSCULAR | Status: AC
  Administered 2021-01-11: 11 [IU] via SUBCUTANEOUS
  Filled 2021-01-11: qty 1

## 2021-01-11 MED ORDER — INSULIN ASPART 100 UNIT/ML IJ SOLN
0.0000 [IU] | Freq: Three times a day (TID) | INTRAMUSCULAR | Status: DC
  Administered 2021-01-12: 5 [IU] via SUBCUTANEOUS
  Administered 2021-01-12 (×2): 2 [IU] via SUBCUTANEOUS
  Administered 2021-01-13: 8 [IU] via SUBCUTANEOUS
  Administered 2021-01-13: 2 [IU] via SUBCUTANEOUS
  Administered 2021-01-13 – 2021-01-15 (×3): 3 [IU] via SUBCUTANEOUS
  Administered 2021-01-15: 8 [IU] via SUBCUTANEOUS
  Administered 2021-01-15: 3 [IU] via SUBCUTANEOUS
  Administered 2021-01-16 (×2): 2 [IU] via SUBCUTANEOUS
  Administered 2021-01-17 (×3): 3 [IU] via SUBCUTANEOUS
  Administered 2021-01-18: 2 [IU] via SUBCUTANEOUS
  Filled 2021-01-11 (×13): qty 1

## 2021-01-11 NOTE — Progress Notes (Addendum)
PROGRESS NOTE    Todd Davidson  SFK:[id number] DOB: 1935/04/09 DOA: 12/31/2020 PCP: Perrin Maltese, MD    Brief Narrative:  Todd Davidson is a 85 y.o. male with medical history significant for asthma/COPD, insulin-dependent diabetes mellitus, history of prostate cancer, dementia with behavioral disturbance, hypertension, who was brought to the hospital because of acute change in mental status/lethargy.  His blood sugar at home was 34.  He was also noted to have oxygen saturation of 80% associated with use of accessory respiratory muscles.  He was placed on 15 L/min oxygen via nonrebreather mask and transported to the emergency room.    He was admitted to the hospital for hypoglycemia, acute hypoxemic respiratory failure secondary to aspiration pneumonia and COPD exacerbation.  He was treated with BiPAP initially.  He was also treated with IV dextrose, IV antibiotics and bronchodilators.   Patient condition has improved, antibiotics completed.  However, patient was tested positive for COVID before transfer to nursing home, he need to be quarantined, transfer on 10/31.    Assessment & Plan:   Principal Problem:   Acute metabolic encephalopathy Active Problems:   Thrombocytopenia ([place])   Diabetes mellitus with hypoglycemia, with long-term current use of insulin ([facility])   Acute respiratory failure ([place])   Aspiration pneumonia ([place])   COPD with acute exacerbation ([place])   Dementia ([place])   DNR (do not resuscitate)   Palliative care by specialist   Hypokalemia  Acute hypoxemic respiratory failure secondary to aspiration pneumonia. Aspiration pneumonia due to dysphagia. Dysphagia. COPD exacerbation. Doing well, able to eat without aspiration.  Appetite is also improving. Off antibiotics and steroids.  Currently pending for nursing home placement.   Acute metabolic encephalopathy. Baseline dementia. Patient back to baseline.  Type 2 diabetes with hypoglycemia. Improved.  COVID  infection. No symptom, no need to treat.  Tremor. Severe debility. History of left renal cell carcinoma. History of prostate cancer. Hypertension.    DVT prophylaxis: Lovenox Code Status: DNR Family Communication: Family is at bedside Disposition Plan:    Status is: Inpatient  Remains inpatient appropriate because: Unsafe discharge.        I/O last 3 completed shifts: In: 240 [P.O.:240] Out: 700 [Urine:700] No intake/output data recorded.       Subjective: Patient doing well today, appetite is good per family.  No nausea vomiting. No short of breath or cough. Still has significant tremor. No short of breath or cough.  Objective: Vitals:   01/10/21 2029 01/11/21 0548 01/11/21 0754 01/11/21 0816  BP: 129/69 140/63 (!) 145/75 127/75  Pulse: 67 (!) 47 (!) 110 67  Resp: 20 18 20 18   Temp: 97.7 F (36.5 C) 97.6 F (36.4 C) 97.6 F (36.4 C) 98 F (36.7 C)  TempSrc: Oral Oral Oral Oral  SpO2: 95% 96% 96% 100%  Weight:      Height:        Intake/Output Summary (Last 24 hours) at 01/11/2021 1358 Last data filed at 01/11/2021 0701 Gross per 24 hour  Intake 0 ml  Output 0 ml  Net 0 ml   Filed Weights   12/31/20 0907 01/03/21 0623 01/06/21 0739  Weight: 72.6 kg 67.7 kg 65.4 kg    Examination:  General exam: Appears calm and comfortable  Respiratory system: Clear to auscultation. Respiratory effort normal. Cardiovascular system: S1 & S2 heard, RRR. No JVD, murmurs, rubs, gallops or clicks. No pedal edema. Gastrointestinal system: Abdomen is nondistended, soft and nontender. No organomegaly or masses felt. Normal bowel  sounds heard. Central nervous system: Alert and oriented x1. No focal neurological deficits. Extremities: Symmetric 5 x 5 power. Skin: No rashes, lesions or ulcers Psychiatry:  Mood & affect appropriate.     Data Reviewed: I have personally reviewed following labs and imaging studies  CBC: Recent Labs  Lab 01/07/21 0515  WBC 5.8   HGB 15.7  HCT 45.6  MCV 89.8  PLT 628*   Basic Metabolic Panel: Recent Labs  Lab 01/07/21 0515  CREATININE 1.03   GFR: Estimated Creatinine Clearance: 51.1 mL/min (by C-G formula based on SCr of 1.03 mg/dL). Liver Function Tests: No results for input(s): AST, ALT, ALKPHOS, BILITOT, PROT, ALBUMIN in the last 168 hours. No results for input(s): LIPASE, AMYLASE in the last 168 hours. No results for input(s): AMMONIA in the last 168 hours. Coagulation Profile: No results for input(s): INR, PROTIME in the last 168 hours. Cardiac Enzymes: No results for input(s): CKTOTAL, CKMB, CKMBINDEX, TROPONINI in the last 168 hours. BNP (last 3 results) No results for input(s): PROBNP in the last 8760 hours. HbA1C: No results for input(s): HGBA1C in the last 72 hours. CBG: Recent Labs  Lab 01/09/21 1737 01/09/21 2114 01/10/21 2118 01/11/21 0751 01/11/21 1202  GLUCAP 145* 138* 238* 207* 256*   Lipid Profile: No results for input(s): CHOL, HDL, LDLCALC, TRIG, CHOLHDL, LDLDIRECT in the last 72 hours. Thyroid Function Tests: No results for input(s): TSH, T4TOTAL, FREET4, T3FREE, THYROIDAB in the last 72 hours. Anemia Panel: No results for input(s): VITAMINB12, FOLATE, FERRITIN, TIBC, IRON, RETICCTPCT in the last 72 hours. Sepsis Labs: No results for input(s): PROCALCITON, LATICACIDVEN in the last 168 hours.  Recent Results (from the past 240 hour(s))  Resp Panel by RT-PCR (Flu A&B, Covid) Nasopharyngeal Swab     Status: Abnormal   Collection Time: 01/07/21 10:20 AM   Specimen: Nasopharyngeal Swab; Nasopharyngeal(NP) swabs in vial transport medium  Result Value Ref Range Status   SARS Coronavirus 2 by RT PCR POSITIVE (A) NEGATIVE Final    Comment: RESULT CALLED TO, READ BACK BY AND VERIFIED WITH: Zonia Kief, RN [phone] 01/07/21 GM (NOTE) SARS-CoV-2 target nucleic acids are DETECTED.  The SARS-CoV-2 RNA is generally detectable in upper respiratory specimens during the acute phase of  infection. Positive results are indicative of the presence of the identified virus, but do not rule out bacterial infection or co-infection with other pathogens not detected by the test. Clinical correlation with patient history and other diagnostic information is necessary to determine patient infection status. The expected result is Negative.  Fact Sheet for Patients: EntrepreneurPulse.com.au  Fact Sheet for Healthcare Providers: IncredibleEmployment.be  This test is not yet approved or cleared by the [place] FDA and  has been authorized for detection and/or diagnosis of SARS-CoV-2 by FDA under an Emergency Use Authorization (EUA).  This EUA will remain in effect (meaning this test can be u sed) for the duration of  the COVID-19 declaration under Section 564(b)(1) of the Act, 21 U.S.C. section 360bbb-3(b)(1), unless the authorization is terminated or revoked sooner.     Influenza A by PCR NEGATIVE NEGATIVE Final   Influenza B by PCR NEGATIVE NEGATIVE Final    Comment: (NOTE) The Xpert Xpress SARS-CoV-2/FLU/RSV plus assay is intended as an aid in the diagnosis of influenza from Nasopharyngeal swab specimens and should not be used as a sole basis for treatment. Nasal washings and aspirates are unacceptable for Xpert Xpress SARS-CoV-2/FLU/RSV testing.  Fact Sheet for Patients: EntrepreneurPulse.com.au  Fact Sheet for Healthcare Providers: IncredibleEmployment.be  This test is not yet approved or cleared by the [place] and has been authorized for detection and/or diagnosis of SARS-CoV-2 by FDA under an Emergency Use Authorization (EUA). This EUA will remain in effect (meaning this test can be used) for the duration of the COVID-19 declaration under Section 564(b)(1) of the Act, 21 U.S.C. section 360bbb-3(b)(1), unless the authorization is terminated or revoked.  Performed at [facility], [address], [place], [place] [address]          Radiology Studies: No results found.      Scheduled Meds:  amLODipine  10 mg Oral Daily   aspirin  300 mg Rectal Daily   benazepril  40 mg Oral QHS   citalopram  10 mg Oral QHS   enoxaparin (LOVENOX) injection  40 mg Subcutaneous Q24H   finasteride  5 mg Oral Daily   fluticasone furoate-vilanterol  1 puff Inhalation Daily   And   umeclidinium bromide  1 puff Inhalation Daily   gabapentin  300 mg Oral BID   QUEtiapine  25 mg Oral QHS   tamsulosin  0.4 mg Oral Daily   Continuous Infusions:   LOS: 11 days    Time spent: 21 minutes    Sharen Hones, MD [facility]   To contact the attending provider between 7A-7P or the covering provider during after hours 7P-7A, please log into the web site [email or web address] and access using universal [place] password for that web site. If you do not have the password, please call the hospital operator.  01/11/2021, 1:58 PM

## 2021-01-11 NOTE — Progress Notes (Signed)
Occupational Therapy Treatment Patient Details Name: Todd Davidson MRN: [id number] DOB: [birth year] Today's Date: 01/11/2021   History of present illness Pt is an 85 y.o. M arriving to ED after being found on the floor by a family member and admitted for metabolic encephalopahty, acute respiratory failure, and aspiration pneumonia. PMH includes DM, hx prostate cancer, hx of dementia, COPD,   OT comments  Upon entering the room, pt supine in bed with friend present in the room. Pt is pleasant and agreeable to OT intervention. Pt reports pain in R wrist this session. RN is aware and reports giving medications prior to arrival. Pt agreeable to bed level exercise. Use of yellow, level 1 resistive theraband for L UE strengthening for lateral pull downs, bicep curls, and shoulder elevation exercises with min cuing for technique. Pt needing rest breaks secondary to fatigue with 2 sets of 10. Pt having tremor in R UE which would increase R wrist pain. Pt having difficulty refocusing on tasks. OT applied ice pack to R wrist and left theraband for pt to utilize on his own. All needs within reach and friend present in room.    Recommendations for follow up therapy are one component of a multi-disciplinary discharge planning process, led by the attending physician.  Recommendations may be updated based on patient status, additional functional criteria and insurance authorization.    Follow Up Recommendations  Home health OT    Assistance Recommended at Discharge Frequent or constant Supervision/Assistance  Equipment Recommendations  None recommended by OT       Precautions / Restrictions Precautions Precautions: Fall Restrictions Weight Bearing Restrictions: No       =       ADL either performed or assessed with clinical judgement   =   Vision Patient Visual Report: No change from baseline     =       Cognition Arousal/Alertness: Awake/alert Behavior During Therapy: Flat  affect Overall Cognitive Status: History of cognitive impairments - at baseline                                 General Comments: Pt follows commands and is pleasant overall.          =           Pertinent Vitals/ Pain       Pain Assessment: Faces Faces Pain Scale: Hurts even more Pain Location: R wrist Pain Descriptors / Indicators: Grimacing;Guarding;Discomfort Pain Intervention(s): Limited activity within patient's tolerance;Monitored during session;Repositioned;Ice applied  =   =    Frequency  Min 2X/week        Progress Toward Goals  OT Goals(current goals can now be found in the care plan section)  Progress towards OT goals: Progressing toward goals  Acute Rehab OT Goals OT Goal Formulation: With family Time For Goal Achievement: 01/18/21 Potential to Achieve Goals: [place] Discharge plan remains appropriate;Frequency remains appropriate    =   AM-PAC OT "6 Clicks" Daily Activity     Outcome Measure   Help from another person eating meals?: None Help from another person taking care of personal grooming?: None Help from another person toileting, which includes using toliet, bedpan, or urinal?: A Lot Help from another person bathing (including washing, rinsing, drying)?: A Little Help from another person to put on and taking off regular upper body clothing?: None Help from another person to put on and taking off regular lower  body clothing?: A Little 6 Click Score: 20    End of Session    OT Visit Diagnosis: Unsteadiness on feet (R26.81);Muscle weakness (generalized) (M62.81)   Activity Tolerance Patient limited by pain   Patient Left in bed;with call bell/phone within reach;with bed alarm set;with family/visitor present   Nurse Communication Mobility status;Other (comment) (pain in R wrist with ice applied)        Time: 6067-7034 OT Time Calculation (min): 18 min  Charges: OT General Charges $OT Visit: 1 Visit OT  Treatments $Therapeutic Exercise: 8-22 mins Darleen Crocker, MS, OTR/L , CBIS ascom [phone]  01/11/21, 3:18 PM

## 2021-01-12 ENCOUNTER — Inpatient Hospital Stay: Payer: Medicare Other

## 2021-01-12 DIAGNOSIS — J690 Pneumonitis due to inhalation of food and vomit: Secondary | ICD-10-CM | POA: Diagnosis not present

## 2021-01-12 DIAGNOSIS — J9601 Acute respiratory failure with hypoxia: Secondary | ICD-10-CM | POA: Diagnosis not present

## 2021-01-12 DIAGNOSIS — G9341 Metabolic encephalopathy: Secondary | ICD-10-CM | POA: Diagnosis not present

## 2021-01-12 LAB — GLUCOSE, CAPILLARY
Glucose-Capillary: 131 mg/dL — ABNORMAL HIGH (ref 70–99)
Glucose-Capillary: 212 mg/dL — ABNORMAL HIGH (ref 70–99)
Glucose-Capillary: 143 mg/dL — ABNORMAL HIGH (ref 70–99)
Glucose-Capillary: 261 mg/dL — ABNORMAL HIGH (ref 70–99)

## 2021-01-12 MED ORDER — ASPIRIN 325 MG PO TABS
325.0000 mg | ORAL_TABLET | Freq: Every day | ORAL | Status: DC
  Administered 2021-01-12 – 2021-01-18 (×7): 325 mg via ORAL
  Filled 2021-01-12 (×7): qty 1

## 2021-01-12 NOTE — Progress Notes (Signed)
Physical Therapy Treatment Patient Details Name: Todd Davidson MRN: [id number] DOB: 06-Jun-1935 Today's Date: 01/12/2021   History of Present Illness Pt is an 85 y.o. M arriving to ED after being found on the floor by a family member and admitted for metabolic encephalopahty, acute respiratory failure, and aspiration pneumonia. PMH includes DM, hx prostate cancer, hx of dementia, COPD,    PT Comments    Patient agreeable to PT. Patient required assistance for sit to stand transfers multiple times from the bed. Patient ambulated a short distance in the room with rolling walker. Patient requires physical assistance with mobility and cues for safety. SNF is recommended at discharged. PT will continue to follow to maximize independence and facilitate return to prior level of function.    Recommendations for follow up therapy are one component of a multi-disciplinary discharge planning process, led by the attending physician.  Recommendations may be updated based on patient status, additional functional criteria and insurance authorization.  Follow Up Recommendations  Skilled nursing-short term rehab (<3 hours/day)     Assistance Recommended at Discharge Frequent or constant Supervision/Assistance  Equipment Recommendations   (to be determined at next level of care)    Recommendations for Other Services       Precautions / Restrictions Precautions Precautions: Fall Restrictions Weight Bearing Restrictions: No     Mobility  Bed Mobility Overal bed mobility: Needs Assistance Bed Mobility: Supine to Sit     Supine to sit: Min assist Sit to supine: Supervision   General bed mobility comments: assistance required to shift pelvis forward in sitting position. increased time required to complete tasks    Transfers Overall transfer level: Needs assistance Equipment used: Rolling walker (2 wheels) Transfers: Sit to/from Stand Sit to Stand: Mod assist;Max assist;From elevated  surface           General transfer comment: 3 bouts of standing performed. Mod A for standing from elevated bed height and Max A for standing with bed in lowest height. verbal cues for technique    Ambulation/Gait Ambulation/Gait assistance: Min assist Gait Distance (Feet): 12 Feet Assistive device: Rolling walker (2 wheels) Gait Pattern/deviations: Step-to pattern;Narrow base of support;Trunk flexed Gait velocity: decreased   General Gait Details: increased time required to complete tasks. steadying assistance provided and cues for task initiation. Sp02 95% on room air after walking, heart rate 75bpm   Stairs             Wheelchair Mobility    Modified Rankin (Stroke Patients Only)       Balance   Sitting-balance support: Feet supported Sitting balance-Leahy Scale: Good       Standing balance-Leahy Scale: Fair Standing balance comment: UE supported on rolling walker                            Cognition Arousal/Alertness: Awake/alert Behavior During Therapy: Flat affect Overall Cognitive Status: History of cognitive impairments - at baseline                                 General Comments: patient able to follow commands with increased time        Exercises      General Comments        Pertinent Vitals/Pain Pain Assessment: No/denies pain    Home Living  Prior Function            PT Goals (current goals can now be found in the care plan section) Acute Rehab PT Goals Patient Stated Goal: none stated Progress towards PT goals: Progressing toward goals    Frequency    Min 2X/week      PT Plan Current plan remains appropriate    Co-evaluation              AM-PAC PT "6 Clicks" Mobility   Outcome Measure  Help needed turning from your back to your side while in a flat bed without using bedrails?: A Little Help needed moving from lying on your back to sitting on the  side of a flat bed without using bedrails?: A Little Help needed moving to and from a bed to a chair (including a wheelchair)?: A Lot Help needed standing up from a chair using your arms (e.g., wheelchair or bedside chair)?: A Lot Help needed to walk in hospital room?: A Little Help needed climbing 3-5 steps with a railing? : A Lot 6 Click Score: 15    End of Session   Activity Tolerance: Patient tolerated treatment well Patient left: in bed;with call bell/phone within reach;with bed alarm set Nurse Communication: Mobility status PT Visit Diagnosis: Other abnormalities of gait and mobility (R26.89);Muscle weakness (generalized) (M62.81);History of falling (Z91.81)     Time: 4132-4401 PT Time Calculation (min) (ACUTE ONLY): 15 min  Charges:  $Therapeutic Activity: 8-22 mins                     Minna Merritts, PT, MPT    Todd Davidson 01/12/2021, 4:17 PM

## 2021-01-12 NOTE — Progress Notes (Signed)
PROGRESS NOTE    Todd Davidson  CLE:[id number] DOB: 01-10-1936 DOA: 12/31/2020 PCP: Perrin Maltese, MD    Brief Narrative:  Todd Davidson is a 86 y.o. male with medical history significant for asthma/COPD, insulin-dependent diabetes mellitus, history of prostate cancer, dementia with behavioral disturbance, hypertension, who was brought to the hospital because of acute change in mental status/lethargy.  His blood sugar at home was 34.  He was also noted to have oxygen saturation of 80% associated with use of accessory respiratory muscles.  He was placed on 15 L/min oxygen via nonrebreather mask and transported to the emergency room.    He was admitted to the hospital for hypoglycemia, acute hypoxemic respiratory failure secondary to aspiration pneumonia and COPD exacerbation.  He was treated with BiPAP initially.  He was also treated with IV dextrose, IV antibiotics and bronchodilators.   Patient condition has improved, antibiotics completed.  However, patient was tested positive for COVID before transfer to nursing home, he need to be quarantined, transfer on 10/31.    Assessment & Plan:   Principal Problem:   Acute metabolic encephalopathy Active Problems:   Thrombocytopenia ([place])   Diabetes mellitus with hypoglycemia, with long-term current use of insulin (HCC)   Acute respiratory failure ([place])   Aspiration pneumonia ([place])   COPD with acute exacerbation ([place])   Dementia ([place])   DNR (do not resuscitate)   Palliative care by specialist   Hypokalemia   COVID-19 virus infection  Pain in the right wrist. Patient has been complaining of pain in the right wrist today, obtain x-ray.  Acute hypoxemic respiratory failure secondary to aspiration pneumonia. Aspiration pneumonia due to dysphagia. Dysphagia. COPD exacerbation. , Pending nursing home placement.   Acute metabolic encephalopathy. Baseline dementia. Patient back to baseline.  Type 2 diabetes with  hypoglycemia. Improved.   COVID infection. Respiratory symptoms, no hypoxia.  No need to treat.  Tremor. Severe debility. History of left renal cell carcinoma. History of prostate cancer. Hypertension. Pending nursing home transfer.  Uncontrolled type 2 diabetes with hyperglycemia. Restarted insulin yesterday.  Continue to follow.    DVT prophylaxis: Lovenox Code Status: DNR Family Communication: Family is at bedside Disposition Plan:      Status is: Inpatient   Remains inpatient appropriate because: Unsafe discharge        I/O last 3 completed shifts: In: 0  Out: 450 [Urine:450] No intake/output data recorded.       Subjective: Patient doing well today.  Appetite had improved, no nausea vomiting abdominal pain. He still has significant tremor which is baseline. Is complaining of right wrist pain, worse with tremor. No fever or chills. No short of breath or cough.  Objective: Vitals:   01/11/21 0816 01/11/21 2018 01/12/21 0528 01/12/21 0839  BP: 127/75 120/62 (!) 149/58 (!) 142/100  Pulse: 67 80 71 (!) 54  Resp: 18 16 18 18   Temp: 98 F (36.7 C) 98.7 F (37.1 C) 97.6 F (36.4 C) 97.7 F (36.5 C)  TempSrc: Oral Oral Oral Oral  SpO2: 100% 93% 96% 93%  Weight:      Height:        Intake/Output Summary (Last 24 hours) at 01/12/2021 1344 Last data filed at 01/11/2021 2300 Gross per 24 hour  Intake --  Output 450 ml  Net -450 ml   Filed Weights   12/31/20 0907 01/03/21 0623 01/06/21 0739  Weight: 72.6 kg 67.7 kg 65.4 kg    Examination:  General exam: Appears calm and comfortable  Respiratory system: Clear to auscultation. Respiratory effort normal. Cardiovascular system: S1 & S2 heard, RRR. No JVD, murmurs, rubs, gallops or clicks. No pedal edema. Gastrointestinal system: Abdomen is nondistended, soft and nontender. No organomegaly or masses felt. Normal bowel sounds heard. Central nervous system: Alert and oriented x1.  No focal  neurological deficits. Extremities: Right wrist slightly swelling. Skin: No rashes, lesions or ulcers Psychiatry:  Mood & affect appropriate.     Data Reviewed: I have personally reviewed following labs and imaging studies  CBC: Recent Labs  Lab 01/07/21 0515  WBC 5.8  HGB 15.7  HCT 45.6  MCV 89.8  PLT 878*   Basic Metabolic Panel: Recent Labs  Lab 01/07/21 0515  CREATININE 1.03   GFR: Estimated Creatinine Clearance: 51.1 mL/min (by C-G formula based on SCr of 1.03 mg/dL). Liver Function Tests: No results for input(s): AST, ALT, ALKPHOS, BILITOT, PROT, ALBUMIN in the last 168 hours. No results for input(s): LIPASE, AMYLASE in the last 168 hours. No results for input(s): AMMONIA in the last 168 hours. Coagulation Profile: No results for input(s): INR, PROTIME in the last 168 hours. Cardiac Enzymes: No results for input(s): CKTOTAL, CKMB, CKMBINDEX, TROPONINI in the last 168 hours. BNP (last 3 results) No results for input(s): PROBNP in the last 8760 hours. HbA1C: Recent Labs    01/11/21 1745  HGBA1C 7.1*   CBG: Recent Labs  Lab 01/11/21 1202 01/11/21 1709 01/11/21 2015 01/12/21 0833 01/12/21 1239  GLUCAP 256* 330* 205* 131* 212*   Lipid Profile: No results for input(s): CHOL, HDL, LDLCALC, TRIG, CHOLHDL, LDLDIRECT in the last 72 hours. Thyroid Function Tests: No results for input(s): TSH, T4TOTAL, FREET4, T3FREE, THYROIDAB in the last 72 hours. Anemia Panel: No results for input(s): VITAMINB12, FOLATE, FERRITIN, TIBC, IRON, RETICCTPCT in the last 72 hours. Sepsis Labs: No results for input(s): PROCALCITON, LATICACIDVEN in the last 168 hours.  Recent Results (from the past 240 hour(s))  Resp Panel by RT-PCR (Flu A&B, Covid) Nasopharyngeal Swab     Status: Abnormal   Collection Time: 01/07/21 10:20 AM   Specimen: Nasopharyngeal Swab; Nasopharyngeal(NP) swabs in vial transport medium  Result Value Ref Range Status   SARS Coronavirus 2 by RT PCR POSITIVE  (A) NEGATIVE Final    Comment: RESULT CALLED TO, READ BACK BY AND VERIFIED WITH: Zonia Kief, RN [phone] 01/07/21 GM (NOTE) SARS-CoV-2 target nucleic acids are DETECTED.  The SARS-CoV-2 RNA is generally detectable in upper respiratory specimens during the acute phase of infection. Positive results are indicative of the presence of the identified virus, but do not rule out bacterial infection or co-infection with other pathogens not detected by the test. Clinical correlation with patient history and other diagnostic information is necessary to determine patient infection status. The expected result is Negative.  Fact Sheet for Patients: EntrepreneurPulse.com.au  Fact Sheet for Healthcare Providers: IncredibleEmployment.be  This test is not yet approved or cleared by the [place] FDA and  has been authorized for detection and/or diagnosis of SARS-CoV-2 by FDA under an Emergency Use Authorization (EUA).  This EUA will remain in effect (meaning this test can be u sed) for the duration of  the COVID-19 declaration under Section 564(b)(1) of the Act, 21 U.S.C. section 360bbb-3(b)(1), unless the authorization is terminated or revoked sooner.     Influenza A by PCR NEGATIVE NEGATIVE Final   Influenza B by PCR NEGATIVE NEGATIVE Final    Comment: (NOTE) The Xpert Xpress SARS-CoV-2/FLU/RSV plus assay is intended as an aid in the  diagnosis of influenza from Nasopharyngeal swab specimens and should not be used as a sole basis for treatment. Nasal washings and aspirates are unacceptable for Xpert Xpress SARS-CoV-2/FLU/RSV testing.  Fact Sheet for Patients: EntrepreneurPulse.com.au  Fact Sheet for Healthcare Providers: IncredibleEmployment.be  This test is not yet approved or cleared by the [place] FDA and has been authorized for detection and/or diagnosis of SARS-CoV-2 by FDA under an Emergency Use  Authorization (EUA). This EUA will remain in effect (meaning this test can be used) for the duration of the COVID-19 declaration under Section 564(b)(1) of the Act, 21 U.S.C. section 360bbb-3(b)(1), unless the authorization is terminated or revoked.  Performed at [facility], [address], [place], [place] [address]          Radiology Studies: No results found.      Scheduled Meds:  amLODipine  10 mg Oral Daily   aspirin  325 mg Oral Daily   benazepril  40 mg Oral QHS   citalopram  10 mg Oral QHS   enoxaparin (LOVENOX) injection  40 mg Subcutaneous Q24H   finasteride  5 mg Oral Daily   fluticasone furoate-vilanterol  1 puff Inhalation Daily   And   umeclidinium bromide  1 puff Inhalation Daily   gabapentin  300 mg Oral BID   insulin aspart  0-15 Units Subcutaneous TID WC   insulin aspart  0-5 Units Subcutaneous QHS   insulin glargine-yfgn  16 Units Subcutaneous QHS   lidocaine  1 patch Transdermal Q24H   QUEtiapine  25 mg Oral QHS   tamsulosin  0.4 mg Oral Daily   Continuous Infusions:   LOS: 12 days    Time spent: 22 minutes    Sharen Hones, MD [facility]   To contact the attending provider between 7A-7P or the covering provider during after hours 7P-7A, please log into the web site [email or web address] and access using universal [place] password for that web site. If you do not have the password, please call the hospital operator.  01/12/2021, 1:44 PM

## 2021-01-13 DIAGNOSIS — J441 Chronic obstructive pulmonary disease with (acute) exacerbation: Secondary | ICD-10-CM | POA: Diagnosis not present

## 2021-01-13 DIAGNOSIS — J9601 Acute respiratory failure with hypoxia: Secondary | ICD-10-CM | POA: Diagnosis not present

## 2021-01-13 DIAGNOSIS — U071 COVID-19: Secondary | ICD-10-CM | POA: Diagnosis not present

## 2021-01-13 DIAGNOSIS — G9341 Metabolic encephalopathy: Secondary | ICD-10-CM | POA: Diagnosis not present

## 2021-01-13 LAB — GLUCOSE, CAPILLARY
Glucose-Capillary: 181 mg/dL — ABNORMAL HIGH (ref 70–99)
Glucose-Capillary: 270 mg/dL — ABNORMAL HIGH (ref 70–99)
Glucose-Capillary: 145 mg/dL — ABNORMAL HIGH (ref 70–99)
Glucose-Capillary: 185 mg/dL — ABNORMAL HIGH (ref 70–99)

## 2021-01-13 MED ORDER — NEPRO/CARBSTEADY PO LIQD
237.0000 mL | Freq: Three times a day (TID) | ORAL | Status: DC
  Administered 2021-01-14 – 2021-01-18 (×12): 237 mL via ORAL

## 2021-01-13 NOTE — Assessment & Plan Note (Signed)
Repleted and resolved 

## 2021-01-13 NOTE — Assessment & Plan Note (Signed)
At baseline 

## 2021-01-13 NOTE — Assessment & Plan Note (Signed)
Has baseline dementia.  Still has significant intermittent confusion and tremor.

## 2021-01-13 NOTE — Assessment & Plan Note (Signed)
Incidental finding.  He is asymptomatic from this and does not require treatment.  Waiting for SNF placement till 10/31 due to [place] for SNF

## 2021-01-13 NOTE — Progress Notes (Signed)
  Progress Note    Todd Davidson   NOB:[id number]  DOB: January 29, 1936  DOA: 12/31/2020     13 Date of Service: 01/13/2021   Clinical Course  Todd Davidson is a 85 y.o. male with medical history significant for asthma/COPD, insulin-dependent diabetes mellitus, history of prostate cancer, dementia with behavioral disturbance, hypertension, who was brought to the hospital because of acute change in mental status/lethargy.  His blood sugar at home was 34.  He was also noted to have oxygen saturation of 80% associated with use of accessory respiratory muscles.  He was placed on 15 L/min oxygen via nonrebreather mask and transported to the emergency room.    He was admitted to the hospital for hypoglycemia, acute hypoxemic respiratory failure secondary to aspiration pneumonia and COPD exacerbation.  He was treated with BiPAP initially.  He was also treated with IV dextrose, IV antibiotics and bronchodilators.   Patient condition has improved, antibiotics completed.  However, patient was tested positive for COVID before transfer to nursing home, he need to be quarantined, transfer on 10/31.   10/26: Waiting for SNF placement till 10/31 due to COVID isolation   Assessment and Plan * Acute metabolic encephalopathy Has baseline dementia.  Still has significant intermittent confusion and tremor.  COVID-19 virus infection Incidental finding.  He is asymptomatic from this and does not require treatment.  Waiting for SNF placement till 10/31 due to [place] for SNF  Hypokalemia Repleted and resolved  [name] At baseline  COPD with acute exacerbation ([place]) Close to baseline.  Continue albuterol, Breo Ellipta, Mucinex as needed  Aspiration pneumonia ([place]) Treated with antibiotics  Acute respiratory failure ([place]) Due to aspiration pneumonia.  Initially required BiPAP but now weaned off.  Now resolved     Subjective:  No new issues.  Son at  bedside.  Objective Vitals:   01/12/21 2111 01/13/21 0427 01/13/21 0807 01/13/21 1626  BP: (!) 142/65 125/62 (!) 142/67 134/64  Pulse: 64 (!) 58 (!) 108 64  Resp: 18 16 18 18   Temp: 98.6 F (37 C) 98.5 F (36.9 C) 98.4 F (36.9 C) 98.4 F (36.9 C)  TempSrc: Oral Oral Oral Oral  SpO2: 94% 92% 95% 94%  Weight:      Height:       65.4 kg  Vital signs were reviewed and unremarkable.   Exam Physical Exam   General appearance: alert, cooperative, and confused Resp: clear to auscultation bilaterally Cardio: regular rate and rhythm, S1, S2 normal, no murmur, click, rub or gallop GI: soft, non-tender; bowel sounds normal; no masses,  no organomegaly Extremities: no edema, significant tremor  Labs / Other Information My review of labs, imaging, notes and other tests shows no new significant findings.    Disposition Plan: Status is: Inpatient  Remains inpatient appropriate because: Remains COVID isolated till 94/76 due to SNF policy    Time spent: 35 minutes [facility] 01/13/2021, 4:52 PM

## 2021-01-13 NOTE — Assessment & Plan Note (Signed)
Treated with antibiotics °

## 2021-01-13 NOTE — Assessment & Plan Note (Addendum)
Due to aspiration pneumonia.  Initially required BiPAP but now weaned off.  Now resolved

## 2021-01-13 NOTE — Assessment & Plan Note (Signed)
Close to baseline.  Continue albuterol, Breo Ellipta, Mucinex as needed

## 2021-01-13 NOTE — Hospital Course (Addendum)
Todd Davidson is a 85 y.o. male with medical history significant for asthma/COPD, insulin-dependent diabetes mellitus, history of prostate cancer, dementia with behavioral disturbance, hypertension, who was brought to the hospital because of acute change in mental status/lethargy.  His blood sugar at home was 34.  He was also noted to have oxygen saturation of 80% associated with use of accessory respiratory muscles.  He was placed on 15 L/min oxygen via nonrebreather mask and transported to the emergency room.    He was admitted to the hospital for hypoglycemia, acute hypoxemic respiratory failure secondary to aspiration pneumonia and COPD exacerbation.  He was treated with BiPAP initially.  He was also treated with IV dextrose, IV antibiotics and bronchodilators.   Patient condition has improved, antibiotics completed.  However, patient was tested positive for COVID before transfer to nursing home, he need to be quarantined, transfer on 10/31.   10/26-10/30: Planned SNF discharge on 10/31

## 2021-01-14 DIAGNOSIS — J441 Chronic obstructive pulmonary disease with (acute) exacerbation: Secondary | ICD-10-CM | POA: Diagnosis not present

## 2021-01-14 DIAGNOSIS — G9341 Metabolic encephalopathy: Secondary | ICD-10-CM | POA: Diagnosis not present

## 2021-01-14 DIAGNOSIS — J9601 Acute respiratory failure with hypoxia: Secondary | ICD-10-CM | POA: Diagnosis not present

## 2021-01-14 DIAGNOSIS — F03C11 Unspecified dementia, severe, with agitation: Secondary | ICD-10-CM | POA: Diagnosis not present

## 2021-01-14 LAB — GLUCOSE, CAPILLARY
Glucose-Capillary: 75 mg/dL (ref 70–99)
Glucose-Capillary: 168 mg/dL — ABNORMAL HIGH (ref 70–99)
Glucose-Capillary: 106 mg/dL — ABNORMAL HIGH (ref 70–99)
Glucose-Capillary: 246 mg/dL — ABNORMAL HIGH (ref 70–99)

## 2021-01-14 LAB — CREATININE, SERUM
Creatinine, Ser: 0.7 mg/dL (ref 0.61–1.24)
GFR, Estimated: >60 mL/min (ref >60)

## 2021-01-14 MED ORDER — INSULIN GLARGINE-YFGN 100 UNIT/ML ~~LOC~~ SOLN
12.0000 [IU] | Freq: Every day | SUBCUTANEOUS | Status: DC
  Administered 2021-01-14 – 2021-01-17 (×4): 12 [IU] via SUBCUTANEOUS
  Filled 2021-01-14 (×5): qty 0.12

## 2021-01-14 MED ORDER — ACETAMINOPHEN 325 MG PO TABS: 650.0000 mg | ORAL_TABLET | Freq: Four times a day (QID) | ORAL | Status: DC | PRN

## 2021-01-14 NOTE — Assessment & Plan Note (Signed)
Son in agreement

## 2021-01-14 NOTE — Progress Notes (Signed)
  Progress Note    Todd Davidson   MAU:[id number]  DOB: 01-Jan-1936  DOA: 12/31/2020     14 Date of Service: 01/14/2021   Clinical Course  [name] is a 85 y.o. male with medical history significant for asthma/COPD, insulin-dependent diabetes mellitus, history of prostate cancer, dementia with behavioral disturbance, hypertension, who was brought to the hospital because of acute change in mental status/lethargy.  His blood sugar at home was 34.  He was also noted to have oxygen saturation of 80% associated with use of accessory respiratory muscles.  He was placed on 15 L/min oxygen via nonrebreather mask and transported to the emergency room.    He was admitted to the hospital for hypoglycemia, acute hypoxemic respiratory failure secondary to aspiration pneumonia and COPD exacerbation.  He was treated with BiPAP initially.  He was also treated with IV dextrose, IV antibiotics and bronchodilators.   Patient condition has improved, antibiotics completed.  However, patient was tested positive for COVID before transfer to nursing home, he need to be quarantined, transfer on 10/31.   10/26: Waiting for SNF placement till 10/31 due to [place] isolation 10/27: sugar 76 this am   Assessment and Plan * Acute metabolic encephalopathy Has baseline dementia.  intermittent confusion and tremor  COVID-19 virus infection Incidental finding.  He is asymptomatic from this and does not require treatment.  Waiting for SNF placement till 10/31 due to COVID isolation policy for SNF.  DNR (do not resuscitate) Son in agreement  Dementia ([place]) At baseline.  COPD with acute exacerbation ([place]) Close to baseline.  Continue albuterol, Breo Ellipta, Mucinex as needed.  Aspiration pneumonia ([place]) Treated with antibiotics.  Acute respiratory failure (HCC) Due to aspiration pneumonia.  Initially required BiPAP but now weaned off.  Now resolved.  Diabetes mellitus with hypoglycemia, with long-term  current use of insulin (HCC) Sugar 76, reduced insulin semglee to 12 units (from 16 units) tonight. Continue SSI    Subjective:  Sugar 76 but asymptomatic. Son at bedside wanting to talk with TOC  Objective Vitals:   01/13/21 1626 01/13/21 2006 01/14/21 0442 01/14/21 0838  BP: 134/64 135/73 (!) 151/74 (!) 155/73  Pulse: 64 74 64 64  Resp: 18 16 16 17   Temp: 98.4 F (36.9 C) 98.6 F (37 C) 97.7 F (36.5 C) 97.6 F (36.4 C)  TempSrc: Oral Oral Oral   SpO2: 94% 96% 95% 96%  Weight:      Height:       65.4 kg  Vital signs were reviewed and unremarkable.   Exam Physical Exam   General appearance:alert, cooperative, andconfused Resp:clear to auscultation bilaterally Cardio:regular rate and rhythm, S1, S2 normal, no murmur, click, rub or gallop BW:LSLH, non-tender; bowel sounds normal; no masses, no organomegaly Extremities:no edema, significant tremor  Labs / Other Information There are no new results to review at this time.   Disposition Plan: Status is: Inpatient  Remains inpatient appropriate because: SNF placement on 10/31 due to covid isolation policy  Time spent: 35 minutes [facility] 01/14/2021, 5:51 PM

## 2021-01-14 NOTE — Assessment & Plan Note (Signed)
Treated with antibiotics °

## 2021-01-14 NOTE — Assessment & Plan Note (Signed)
Close to baseline.  Continue albuterol, Breo Ellipta, Mucinex as needed.

## 2021-01-14 NOTE — Progress Notes (Signed)
Inpatient Diabetes Program Recommendations  AACE/ADA: New Consensus Statement on Inpatient Glycemic Control (2015)  Target Ranges:  Prepandial:   less than 140 mg/dL      [facility] postprandial:   less than 180 mg/dL (1-2 hours)      Critically ill patients:  140 - 180 mg/dL  Results for ALIJA, RIANO (MRN [id number]) as of 01/14/2021 11:23  Ref. Range 01/13/2021 08:02 01/13/2021 11:44 01/13/2021 16:05 01/13/2021 20:55  Glucose-Capillary Latest Ref Range: 70 - 99 mg/dL 181 (H)  3 units Novolog  270 (H)  8 units Novolog  145 (H)  2 units Novolog  185 (H)    16 units Semglee  Results for TANNEN, VANDEZANDE (MRN [id number]) as of 01/14/2021 11:23  Ref. Range 01/14/2021 08:38  Glucose-Capillary Latest Ref Range: 70 - 99 mg/dL 75    Home DM Meds: Lantus 16 units Daily   Current Orders: Semglee 16 units QHS      Novolog Moderate Correction Scale/ SSI (0-15 units) TID AC + HS    MD- Note CBG only 75 this AM after getting 16 units Semglee insulin last PM  Please consider reducing Semglee to 12 units QHS for tonight     --Will follow patient during hospitalization--  Wyn Quaker RN, MSN, CDE Diabetes Coordinator Inpatient Glycemic Control Team Team Pager: [phone] (8a-5p)

## 2021-01-14 NOTE — Assessment & Plan Note (Signed)
Due to aspiration pneumonia.  Initially required BiPAP but now weaned off.  Now resolved.

## 2021-01-14 NOTE — Assessment & Plan Note (Signed)
At baseline 

## 2021-01-14 NOTE — TOC Progression Note (Signed)
Transition of Care [facility]) - Progression Note    Patient Details  Name: Todd Davidson MRN: [id number] Date of Birth: Jul 19, 1935  [facility] of [facility]) CM/SW Contact  Beverly Sessions, RN Phone Number: 01/14/2021, 10:23 AM  Clinical Narrative:     Plan remains for patient to discharge to [facility] 10/31 Brother is aware that insurance will have to approve in order for him to go  Brother is aware that if insurance does not approve that placement will be private pay, or patient will have to go home  Expected Discharge Plan: [place] Barriers to Discharge: Barriers Resolved  Expected Discharge Plan and Services Expected Discharge Plan: [place] Choice: [place]   Expected Discharge Date: 01/07/21                                     Social Determinants of Health (SDOH) Interventions    Readmission Risk Interventions Readmission Risk Prevention Plan 11/05/2018  Post Dischage Appt Not Complete  Appt Comments Pt discharging to SNF  Medication Screening Complete  Transportation Screening Complete  Some recent data might be hidden

## 2021-01-14 NOTE — Assessment & Plan Note (Signed)
Sugar 76, reduced insulin semglee to 12 units (from 16 units) tonight. Continue SSI

## 2021-01-14 NOTE — Progress Notes (Signed)
Physical Therapy Treatment Patient Details Name: Todd Davidson MRN: [id number] DOB: 05/09/1935 Today's Date: 01/14/2021   History of Present Illness Pt is an 85 y.o. M arriving to ED after being found on the floor by a family member and admitted for metabolic encephalopahty, acute respiratory failure, and aspiration pneumonia. PMH includes DM, hx prostate cancer, hx of dementia, COPD,    PT Comments    Patient finishing lunch on arrival to room, agreeable to PT. Patient continues to require assistance for transfers and ambulation. Patient increased ambulation distance today to 76ft using rolling walker with cues for safety  and steadying assistance provided. Patient fatigued with activity, Sp02 98% on room air after walking. SNF is recommended at discharge for ongoing PT to maximize independence and facilitate return to prior level of function. PT will continue to follow.    Recommendations for follow up therapy are one component of a multi-disciplinary discharge planning process, led by the attending physician.  Recommendations may be updated based on patient status, additional functional criteria and insurance authorization.  Follow Up Recommendations  Skilled nursing-short term rehab (<3 hours/day)     Assistance Recommended at Discharge Frequent or constant Supervision/Assistance  Equipment Recommendations  Other (comment) (to be determined at next level of care)    Recommendations for Other Services       Precautions / Restrictions Precautions Precautions: Fall Restrictions Weight Bearing Restrictions: No     Mobility  Bed Mobility Overal bed mobility: Needs Assistance Bed Mobility: Supine to Sit     Supine to sit: Supervision;HOB elevated Sit to supine: Supervision   General bed mobility comments: increased time and effort required. cues for task initiation    Transfers Overall transfer level: Needs assistance Equipment used: Rolling walker (2  wheels) Transfers: Sit to/from Stand Sit to Stand: Mod assist           General transfer comment: verbal cues for hand placement and safety. lifting and lowering assistance provided    Ambulation/Gait Ambulation/Gait assistance: Min assist Gait Distance (Feet): 18 Feet Assistive device: Rolling walker (2 wheels) Gait Pattern/deviations: Step-to pattern;Decreased stance time - right Gait velocity: decreased   General Gait Details: RLE internally rotated in stance phase of gait with no knee flexion. minimal assistance required for steadying and negotiation of rolling walker. activity tolerance limited by fatigue   Stairs             Wheelchair Mobility    Modified Rankin (Stroke Patients Only)       Balance Overall balance assessment: Needs assistance Sitting-balance support: No upper extremity supported;Feet supported Sitting balance-Leahy Scale: Good     Standing balance support: During functional activity;Bilateral upper extremity supported Standing balance-Leahy Scale: Fair Standing balance comment: patient relying on rolling walker for support in standing                            Cognition Arousal/Alertness: Awake/alert Behavior During Therapy: Flat affect Overall Cognitive Status: History of cognitive impairments - at baseline                                 General Comments: patient able to follow single step commands with extra time        Exercises      General Comments General comments (skin integrity, edema, etc.): Sp02 98% on room air after walking, heart rate 71bpm. no shortness of breath  noted with activity.      Pertinent Vitals/Pain Pain Assessment: Faces Faces Pain Scale: Hurts a little bit Breathing: normal Negative Vocalization: none Facial Expression: smiling or inexpressive Body Language: relaxed Consolability: no need to console PAINAD Score: 0 Pain Location: R wrist Pain Descriptors / Indicators:  Sore Pain Intervention(s): Limited activity within patient's tolerance    Home Living                          Prior Function            PT Goals (current goals can now be found in the care plan section) Acute Rehab PT Goals Patient Stated Goal: none stated Progress towards PT goals: Progressing toward goals    Frequency    Min 2X/week      PT Plan Current plan remains appropriate    Co-evaluation              AM-PAC PT "6 Clicks" Mobility   Outcome Measure  Help needed turning from your back to your side while in a flat bed without using bedrails?: A Little Help needed moving from lying on your back to sitting on the side of a flat bed without using bedrails?: A Little Help needed moving to and from a bed to a chair (including a wheelchair)?: A Lot Help needed standing up from a chair using your arms (e.g., wheelchair or bedside chair)?: A Lot Help needed to walk in hospital room?: A Little Help needed climbing 3-5 steps with a railing? : A Lot 6 Click Score: 15    End of Session   Activity Tolerance: Patient tolerated treatment well Patient left: in bed;with call bell/phone within reach;with bed alarm set   PT Visit Diagnosis: Other abnormalities of gait and mobility (R26.89);Muscle weakness (generalized) (M62.81);History of falling (Z91.81)     Time: 4825-0037 PT Time Calculation (min) (ACUTE ONLY): 15 min  Charges:  $Therapeutic Activity: 8-22 mins                     {Ragena Fiola Quentin Cornwall, PT, MPT    [name] 01/14/2021, 1:55 PM

## 2021-01-14 NOTE — Assessment & Plan Note (Signed)
Has baseline dementia.  intermittent confusion and tremor

## 2021-01-14 NOTE — Progress Notes (Signed)
Occupational Therapy Treatment Patient Details Name: Todd Davidson MRN: [id number] DOB: 1935-12-17 Today's Date: 01/14/2021   History of present illness Pt is an 85 y.o. M arriving to ED after being found on the floor by a family member and admitted for metabolic encephalopahty, acute respiratory failure, and aspiration pneumonia. PMH includes DM, hx prostate cancer, hx of dementia, COPD,   OT comments  Pt seen for OT treatment on this date. Upon arrival to room, pt awake and seated upright in bed with caregiver present. Pt agreeable to OT tx. Pt currently presents with decreased strength, decreased activity tolerance, and intention tremors. D/t these current functional impairments, pt requires MOD A for sit>stand LB dressing, MIN A for standing grooming tasks, and MIN A for functional mobility of short household distances with RW. Caregiver at bedside reporting that pt "is moving slower" than baseline. Pt continues to benefit from skilled OT services to maximize return to PLOF and minimize risk of future falls, injury, caregiver burden, and readmission. Will continue to follow POC. Discharge recommendation remains appropriate.     Recommendations for follow up therapy are one component of a multi-disciplinary discharge planning process, led by the attending physician.  Recommendations may be updated based on patient status, additional functional criteria and insurance authorization.    Follow Up Recommendations  Home health OT    Assistance Recommended at Discharge Frequent or constant Supervision/Assistance  Equipment Recommendations  None recommended by OT       Precautions / Restrictions Precautions Precautions: Fall Restrictions Weight Bearing Restrictions: No       Mobility Bed Mobility Overal bed mobility: Needs Assistance Bed Mobility: Supine to Sit     Supine to sit: Supervision;HOB elevated Sit to supine: Supervision   General bed mobility comments: Able to perform  with increase time/effort and use of bedrails    Transfers Overall transfer level: Needs assistance Equipment used: Rolling walker (2 wheels) Transfers: Sit to/from Stand Sit to Stand: Mod assist           General transfer comment: x2 bouts. Requires MOD A from lowest bed height     Balance Overall balance assessment: Needs assistance Sitting-balance support: No upper extremity supported;Feet supported Sitting balance-Leahy Scale: Good     Standing balance support: During functional activity;Bilateral upper extremity supported Standing balance-Leahy Scale: Fair Standing balance comment: MIN A d/t posterior lean during bimanual standing grooming tasks                           ADL either performed or assessed with clinical judgement   ADL Overall ADL's : Needs assistance/impaired     Grooming: Wash/dry hands;Wash/dry face;Oral care;Minimal assistance;Standing Grooming Details (indicate cue type and reason): Requires MIN A via HHA to engage RUE in grooming tasks d/t intention tremors             Lower Body Dressing: Sit to/from stand;Moderate assistance Lower Body Dressing Details (indicate cue type and reason): Pt able to thread briefs through feet and bring to thigh, however requires MOD A for sit>stand transfer and to pull over hips             Functional mobility during ADLs: Minimal assistance;Rolling walker (2 wheels)        Cognition Arousal/Alertness: Awake/alert Behavior During Therapy: Flat affect Overall Cognitive Status: History of cognitive impairments - at baseline  General Comments: patient able to follow commands with increased time                     Pertinent Vitals/ Pain       Pain Assessment: No/denies pain         Frequency  Min 2X/week        Progress Toward Goals  OT Goals(current goals can now be found in the care plan section)  Progress towards OT goals:  Progressing toward goals  Acute Rehab OT Goals OT Goal Formulation: With family Time For Goal Achievement: 01/18/21 Potential to Achieve Goals: Todd Davidson Discharge plan remains appropriate;Frequency remains appropriate       AM-PAC OT "6 Clicks" Daily Activity     Outcome Measure   Help from another person eating meals?: None Help from another person taking care of personal grooming?: A Little Help from another person toileting, which includes using toliet, bedpan, or urinal?: A Lot Help from another person bathing (including washing, rinsing, drying)?: A Little Help from another person to put on and taking off regular upper body clothing?: None Help from another person to put on and taking off regular lower body clothing?: A Lot 6 Click Score: 18    End of Session Equipment Utilized During Treatment: Gait belt;Rolling walker (2 wheels)  OT Visit Diagnosis: Unsteadiness on feet (R26.81);Muscle weakness (generalized) (M62.81)   Activity Tolerance Patient limited by pain   Patient Left in bed;with call bell/phone within reach;with bed alarm set;with family/visitor present   Nurse Communication Mobility status        Time: 5993-5701 OT Time Calculation (min): 24 min  Charges: OT General Charges $OT Visit: 1 Visit OT Treatments $Self Care/Home Management : 23-37 mins  Todd Davidson, [place]

## 2021-01-14 NOTE — Assessment & Plan Note (Signed)
Incidental finding.  He is asymptomatic from this and does not require treatment.  Waiting for SNF placement till 10/31 due to COVID isolation policy for SNF.

## 2021-01-15 DIAGNOSIS — U071 COVID-19: Secondary | ICD-10-CM | POA: Diagnosis not present

## 2021-01-15 DIAGNOSIS — J9601 Acute respiratory failure with hypoxia: Secondary | ICD-10-CM | POA: Diagnosis not present

## 2021-01-15 DIAGNOSIS — R159 Full incontinence of feces: Secondary | ICD-10-CM

## 2021-01-15 DIAGNOSIS — G9341 Metabolic encephalopathy: Secondary | ICD-10-CM | POA: Diagnosis not present

## 2021-01-15 DIAGNOSIS — R32 Unspecified urinary incontinence: Secondary | ICD-10-CM

## 2021-01-15 DIAGNOSIS — F03C11 Unspecified dementia, severe, with agitation: Secondary | ICD-10-CM | POA: Diagnosis not present

## 2021-01-15 LAB — GLUCOSE, CAPILLARY
Glucose-Capillary: 157 mg/dL — ABNORMAL HIGH (ref 70–99)
Glucose-Capillary: 287 mg/dL — ABNORMAL HIGH (ref 70–99)
Glucose-Capillary: 193 mg/dL — ABNORMAL HIGH (ref 70–99)
Glucose-Capillary: 210 mg/dL — ABNORMAL HIGH (ref 70–99)

## 2021-01-15 NOTE — Assessment & Plan Note (Signed)
Close to baseline.  Continue albuterol, Breo Ellipta, Mucinex as needed

## 2021-01-15 NOTE — Assessment & Plan Note (Signed)
Incidental finding.  He is asymptomatic from this and does not require treatment.  Waiting for SNF placement till 10/31 due to [place] for SNF

## 2021-01-15 NOTE — Assessment & Plan Note (Signed)
Continue current insulin regimen

## 2021-01-15 NOTE — Assessment & Plan Note (Signed)
Repleted and resolved 

## 2021-01-15 NOTE — Assessment & Plan Note (Signed)
Due to aspiration pneumonia.  Initially required BiPAP but now weaned off.  Now resolved, he is on room air

## 2021-01-15 NOTE — Assessment & Plan Note (Signed)
Treated with antibiotics °

## 2021-01-15 NOTE — TOC Progression Note (Signed)
Transition of Care [facility]) - Progression Note    Patient Details  Name: Todd Davidson MRN: [id number] Date of Birth: 04-10-1935  [facility] of [facility] CM/SW Contact  Beverly Sessions, RN Phone Number: 01/15/2021, 10:00 AM  Clinical Narrative:     Plan for dc to [facility] on Monday Started auth in navi portal Patient will not need a repeat covid since he is a recovered covid   Expected Discharge Plan: Skilled Nursing Facility Barriers to Discharge: Barriers Resolved  Expected Discharge Plan and Services Expected Discharge Plan: [place] Choice: [place]   Expected Discharge Date: 01/07/21                                     Social Determinants of Health (SDOH) Interventions    Readmission Risk Interventions Readmission Risk Prevention Plan 11/05/2018  Post Dischage Appt Not Complete  Appt Comments Pt discharging to SNF  Medication Screening Complete  Transportation Screening Complete  Some recent data might be hidden

## 2021-01-15 NOTE — Assessment & Plan Note (Signed)
At baseline.  Overall poor prognosis

## 2021-01-15 NOTE — Progress Notes (Signed)
  Progress Note    Todd Davidson   ZYS:[id number]  DOB: 1935-05-07  DOA: 12/31/2020     15 Date of Service: 01/15/2021   Clinical Course  Todd Davidson is a 85 y.o. male with medical history significant for asthma/COPD, insulin-dependent diabetes mellitus, history of prostate cancer, dementia with behavioral disturbance, hypertension, who was brought to the hospital because of acute change in mental status/lethargy.  His blood sugar at home was 34.  He was also noted to have oxygen saturation of 80% associated with use of accessory respiratory muscles.  He was placed on 15 L/min oxygen via nonrebreather mask and transported to the emergency room.    He was admitted to the hospital for hypoglycemia, acute hypoxemic respiratory failure secondary to aspiration pneumonia and COPD exacerbation.  He was treated with BiPAP initially.  He was also treated with IV dextrose, IV antibiotics and bronchodilators.   Patient condition has improved, antibiotics completed.  However, patient was tested positive for COVID before transfer to nursing home, he need to be quarantined, transfer on 10/31.   10/26: Waiting for SNF placement till 10/31 due to [place] isolation 10/27: sugar 76 this am 10/28: Son reports bladder and bowel incontinence which are new   Assessment and Plan * Acute metabolic encephalopathy Has baseline dementia.  intermittent confusion and tremor.  Overall poor prognosis  Bowel and bladder incontinence Son reported today.  This could be suggestive of overall clinical decline  COVID-19 virus infection Incidental finding.  He is asymptomatic from this and does not require treatment.  Waiting for SNF placement till 10/31 due to [place] for SNF  Hypokalemia Repleted and resolved.  Palliative care by specialist Outpatient follow-up  DNR (do not resuscitate) Poor prognosis.  Consider outpatient palliative care at facility and if no improvement transition to  hospice  Dementia ([place]) At baseline.  Overall poor prognosis  COPD with acute exacerbation ([place]) Close to baseline.  Continue albuterol, Breo Ellipta, Mucinex as needed  Aspiration pneumonia ([place]) Treated with antibiotics  Acute respiratory failure ([place]) Due to aspiration pneumonia.  Initially required BiPAP but now weaned off.  Now resolved, he is on room air  Diabetes mellitus with hypoglycemia, with long-term current use of insulin (HCC) Continue current insulin regimen.     Subjective:  Son reports bowel and bladder incontinence which are new.  Objective Vitals:   01/14/21 2024 01/15/21 0447 01/15/21 0453 01/15/21 0803  BP: (!) 150/91 (!) 150/84 133/79 (!) 151/77  Pulse: 89 72 96 68  Resp: 17 16 17 18   Temp: 98.3 F (36.8 C) 98 F (36.7 C) 98.2 F (36.8 C) 98.9 F (37.2 C)  TempSrc: Oral Oral Oral   SpO2: 94% 96% 92% 95%  Weight:      Height:       65.4 kg  Vital signs were reviewed and unremarkable.   Exam Physical Exam   General appearance: alert, cooperative, and confused Resp: clear to auscultation bilaterally Cardio: regular rate and rhythm, S1, S2 normal, no murmur, click, rub or gallop GI: soft, non-tender; bowel sounds normal; no masses,  no organomegaly Extremities: no edema, significant tremor  Labs / Other Information There are no new results to review at this time.   Disposition Plan: Status is: Inpatient  Remains inpatient appropriate because: Waiting for SNF placement on 10/31.  COVID isolation policy per SNF  Updated son at bedside   Time spent: 35 minutes [facility] 01/15/2021, 3:16 PM

## 2021-01-15 NOTE — Plan of Care (Signed)
  Problem: Clinical Measurements: Goal: Ability to maintain clinical measurements within normal limits will improve Outcome: Progressing Goal: Will remain free from infection Outcome: Progressing Goal: Diagnostic test results will improve Outcome: Progressing Goal: Respiratory complications will improve Outcome: Progressing Goal: Cardiovascular complication will be avoided Outcome: Progressing   Pt is very confused. Alert and oriented to self only. V/S stable. Denies any pain or SOB.

## 2021-01-15 NOTE — Assessment & Plan Note (Signed)
Has baseline dementia.  intermittent confusion and tremor.  Overall poor prognosis

## 2021-01-15 NOTE — Assessment & Plan Note (Signed)
Son reported today.  This could be suggestive of overall clinical decline

## 2021-01-15 NOTE — Assessment & Plan Note (Signed)
Poor prognosis.  Consider outpatient palliative care at facility and if no improvement transition to hospice

## 2021-01-15 NOTE — TOC Progression Note (Addendum)
Transition of Care [facility]) - Progression Note    Patient Details  Name: Todd Davidson MRN: [id number] Date of Birth: 11-18-1935  Transition of Care [facility]) CM/SW Contact  Beverly Sessions, RN Phone Number: 01/15/2021, 12:11 PM  Clinical Narrative:     Insurance auth approved.  [id number] Josem Kaufmann number start date 10/31  Expected Discharge Plan: [place] Barriers to Discharge: Barriers Resolved  Expected Discharge Plan and Services Expected Discharge Plan: [place] Choice: [place]   Expected Discharge Date: 01/07/21                                     Social Determinants of Health (SDOH) Interventions    Readmission Risk Interventions Readmission Risk Prevention Plan 11/05/2018  Post Dischage Appt Not Complete  Appt Comments Pt discharging to SNF  Medication Screening Complete  Transportation Screening Complete  Some recent data might be hidden

## 2021-01-15 NOTE — Assessment & Plan Note (Signed)
-   Outpatient follow-up °

## 2021-01-16 DIAGNOSIS — Z515 Encounter for palliative care: Secondary | ICD-10-CM | POA: Diagnosis not present

## 2021-01-16 DIAGNOSIS — G9341 Metabolic encephalopathy: Secondary | ICD-10-CM | POA: Diagnosis not present

## 2021-01-16 DIAGNOSIS — J9601 Acute respiratory failure with hypoxia: Secondary | ICD-10-CM | POA: Diagnosis not present

## 2021-01-16 DIAGNOSIS — R32 Unspecified urinary incontinence: Secondary | ICD-10-CM | POA: Diagnosis not present

## 2021-01-16 LAB — GLUCOSE, CAPILLARY
Glucose-Capillary: 109 mg/dL — ABNORMAL HIGH (ref 70–99)
Glucose-Capillary: 130 mg/dL — ABNORMAL HIGH (ref 70–99)
Glucose-Capillary: 134 mg/dL — ABNORMAL HIGH (ref 70–99)
Glucose-Capillary: 210 mg/dL — ABNORMAL HIGH (ref 70–99)

## 2021-01-16 NOTE — Assessment & Plan Note (Signed)
Son reported today.  This could be suggestive of overall clinical decline.

## 2021-01-16 NOTE — Assessment & Plan Note (Signed)
Incidental finding.  He is asymptomatic from this and does not require treatment.  Waiting for SNF placement till 10/31 due to [place] for SNF

## 2021-01-16 NOTE — Assessment & Plan Note (Signed)
Outpatient follow-up.  Consider transition to hospice if no further clinical improvement

## 2021-01-16 NOTE — Assessment & Plan Note (Signed)
At baseline.  Overall poor prognosis.

## 2021-01-16 NOTE — Assessment & Plan Note (Signed)
Due to aspiration pneumonia.  Initially required BiPAP but now weaned off.  Now resolved, he is on room air.

## 2021-01-16 NOTE — Progress Notes (Signed)
Progress Note    Todd Davidson   [id number]  DOB: 1936/01/29  DOA: 12/31/2020     16 Date of Service: 01/16/2021   Clinical Course  Todd Davidson is a 85 y.o. male with medical history significant for asthma/COPD, insulin-dependent diabetes mellitus, history of prostate cancer, dementia with behavioral disturbance, hypertension, who was brought to the hospital because of acute change in mental status/lethargy.  His blood sugar at home was 34.  He was also noted to have oxygen saturation of 80% associated with use of accessory respiratory muscles.  He was placed on 15 L/min oxygen via nonrebreather mask and transported to the emergency room.    He was admitted to the hospital for hypoglycemia, acute hypoxemic respiratory failure secondary to aspiration pneumonia and COPD exacerbation.  He was treated with BiPAP initially.  He was also treated with IV dextrose, IV antibiotics and bronchodilators.   Patient condition has improved, antibiotics completed.  However, patient was tested positive for COVID before transfer to nursing home, he need to be quarantined, transfer on 10/31.   10/26: Waiting for SNF placement till 10/31 due to [place] isolation 10/27: sugar 76 this am 10/28: Son reports bladder and bowel incontinence which are new 10/29: Waiting for SNF on 10/31   Assessment and Plan * Acute metabolic encephalopathy Has baseline dementia.  intermittent confusion and tremor.  Overall poor prognosis.  Bowel and bladder incontinence Son reported today.  This could be suggestive of overall clinical decline.  COVID-19 virus infection Incidental finding.  He is asymptomatic from this and does not require treatment.  Waiting for SNF placement till 10/31 due to [place] for SNF  Hypokalemia Repleted and resolved  Palliative care by specialist Outpatient follow-up.  Consider transition to hospice if no further clinical improvement  DNR (do not resuscitate) Poor  prognosis.  Consider outpatient palliative care at facility and if no improvement transition to hospice.  Dementia ([place]) At baseline.  Overall poor prognosis.  COPD with acute exacerbation ([place]) Close to baseline.  Continue albuterol, Breo Ellipta, Mucinex as needed.  Aspiration pneumonia ([place]) Treated with antibiotics.  Acute respiratory failure (HCC) Due to aspiration pneumonia.  Initially required BiPAP but now weaned off.  Now resolved, he is on room air.  Diabetes mellitus with hypoglycemia, with long-term current use of insulin (HCC) Continue current insulin regimen     Subjective:  No new issues.  Son at bedside  Objective Vitals:   01/15/21 1500 01/15/21 1957 01/16/21 0448 01/16/21 0825  BP: 122/62 135/80 138/71 (!) 147/66  Pulse: 84 92 78 84  Resp: 18 16 18 18   Temp: 98.6 F (37 C) 98.3 F (36.8 C) 98.3 F (36.8 C) 98.6 F (37 C)  TempSrc: Oral Oral Oral   SpO2: 94% 95% 93% 92%  Weight:      Height:       65.4 kg  Vital signs were reviewed and unremarkable.   Exam Physical Exam   General appearance: alert, cooperative, and confused Resp: clear to auscultation bilaterally Cardio: regular rate and rhythm, S1, S2 normal, no murmur, click, rub or gallop GI: soft, non-tender; bowel sounds normal; no masses,  no organomegaly Extremities: no edema, significant tremor  Labs / Other Information There are no new results to review at this time.   Disposition Plan: Status is: Inpatient  Remains inpatient appropriate because: SNF on 10/31 as planned   Updated son at bedside     Time spent: 15 minutes [facility] 01/16/2021, 2:14  PM

## 2021-01-16 NOTE — Assessment & Plan Note (Signed)
Continue current insulin regimen

## 2021-01-16 NOTE — Plan of Care (Signed)
  Problem: Clinical Measurements: Goal: Ability to maintain clinical measurements within normal limits will improve Outcome: Progressing Goal: Will remain free from infection Outcome: Progressing Goal: Diagnostic test results will improve Outcome: Progressing Goal: Respiratory complications will improve Outcome: Progressing Goal: Cardiovascular complication will be avoided Outcome: Progressing   Pt is alert and oriented to self only. Pt was restless and agitated; Haldol IM given. 2x BM noted this shift.

## 2021-01-16 NOTE — Assessment & Plan Note (Signed)
Repleted and resolved 

## 2021-01-16 NOTE — Assessment & Plan Note (Signed)
Treated with antibiotics °

## 2021-01-16 NOTE — Assessment & Plan Note (Signed)
Has baseline dementia.  intermittent confusion and tremor.  Overall poor prognosis.

## 2021-01-16 NOTE — Assessment & Plan Note (Signed)
Poor prognosis.  Consider outpatient palliative care at facility and if no improvement transition to hospice.

## 2021-01-16 NOTE — Assessment & Plan Note (Signed)
Close to baseline.  Continue albuterol, Breo Ellipta, Mucinex as needed.

## 2021-01-17 DIAGNOSIS — J441 Chronic obstructive pulmonary disease with (acute) exacerbation: Secondary | ICD-10-CM | POA: Diagnosis not present

## 2021-01-17 DIAGNOSIS — J9601 Acute respiratory failure with hypoxia: Secondary | ICD-10-CM | POA: Diagnosis not present

## 2021-01-17 DIAGNOSIS — U071 COVID-19: Secondary | ICD-10-CM | POA: Diagnosis not present

## 2021-01-17 DIAGNOSIS — G9341 Metabolic encephalopathy: Secondary | ICD-10-CM | POA: Diagnosis not present

## 2021-01-17 LAB — GLUCOSE, CAPILLARY
Glucose-Capillary: 166 mg/dL — ABNORMAL HIGH (ref 70–99)
Glucose-Capillary: 174 mg/dL — ABNORMAL HIGH (ref 70–99)
Glucose-Capillary: 158 mg/dL — ABNORMAL HIGH (ref 70–99)
Glucose-Capillary: 59 mg/dL — ABNORMAL LOW (ref 70–99)
Glucose-Capillary: 62 mg/dL — ABNORMAL LOW (ref 70–99)
Glucose-Capillary: 104 mg/dL — ABNORMAL HIGH (ref 70–99)

## 2021-01-17 MED ORDER — NEPRO/CARBSTEADY PO LIQD
237.0000 mL | Freq: Three times a day (TID) | ORAL | 0 refills | Status: DC
Start: 2021-01-17 — End: 2023-02-14

## 2021-01-17 NOTE — Assessment & Plan Note (Signed)
Poor prognosis.  Consider outpatient palliative care at facility and if no improvement transition to hospice.

## 2021-01-17 NOTE — Assessment & Plan Note (Signed)
Due to aspiration pneumonia.  Initially required BiPAP but now weaned off.  Now resolved, he is on room air.

## 2021-01-17 NOTE — Assessment & Plan Note (Signed)
At baseline.  Overall poor prognosis.

## 2021-01-17 NOTE — Assessment & Plan Note (Signed)
Has baseline dementia.  intermittent confusion and tremor.  Overall poor prognosis.

## 2021-01-17 NOTE — Assessment & Plan Note (Signed)
Repleted and resolved 

## 2021-01-17 NOTE — Assessment & Plan Note (Signed)
Close to baseline.  Continue albuterol, Breo Ellipta, Mucinex as needed.

## 2021-01-17 NOTE — Assessment & Plan Note (Signed)
Continue current insulin regimen

## 2021-01-17 NOTE — Assessment & Plan Note (Signed)
Incidental finding.  He is asymptomatic from this and does not require treatment.  Waiting for SNF placement till 10/31 due to [place] for SNF

## 2021-01-17 NOTE — Assessment & Plan Note (Signed)
Son reported today.  This could be suggestive of overall clinical decline.

## 2021-01-17 NOTE — Progress Notes (Signed)
  Progress Note    Todd Davidson   YBW:[id number]  DOB: [birth year]  DOA: 12/31/2020     17 Date of Service: 01/17/2021   Clinical Course  Todd Davidson is a 85 y.o. male with medical history significant for asthma/COPD, insulin-dependent diabetes mellitus, history of prostate cancer, dementia with behavioral disturbance, hypertension, who was brought to the hospital because of acute change in mental status/lethargy.  His blood sugar at home was 34.  He was also noted to have oxygen saturation of 80% associated with use of accessory respiratory muscles.  He was placed on 15 L/min oxygen via nonrebreather mask and transported to the emergency room.    He was admitted to the hospital for hypoglycemia, acute hypoxemic respiratory failure secondary to aspiration pneumonia and COPD exacerbation.  He was treated with BiPAP initially.  He was also treated with IV dextrose, IV antibiotics and bronchodilators.   Patient condition has improved, antibiotics completed.  However, patient was tested positive for COVID before transfer to nursing home, he need to be quarantined, transfer on 10/31.   10/26-10/30: Planned SNF discharge on 10/31   Assessment and Plan * Acute metabolic encephalopathy Has baseline dementia.  intermittent confusion and tremor.  Overall poor prognosis.  Bowel and bladder incontinence Son reported today.  This could be suggestive of overall clinical decline.  COVID-19 virus infection Incidental finding.  He is asymptomatic from this and does not require treatment.  Waiting for SNF placement till 10/31 due to [place] for SNF  Hypokalemia Repleted and resolved  DNR (do not resuscitate) Poor prognosis.  Consider outpatient palliative care at facility and if no improvement transition to hospice.  Dementia ([place]) At baseline.  Overall poor prognosis.  COPD with acute exacerbation ([place]) Close to baseline.  Continue albuterol, Breo Ellipta, Mucinex as  needed.  Aspiration pneumonia ([place]) Treated with antibiotics.  Acute respiratory failure ([facility]) Due to aspiration pneumonia.  Initially required BiPAP but now weaned off.  Now resolved, he is on room air.  Diabetes mellitus with hypoglycemia, with long-term current use of insulin ([facility]) Continue current insulin regimen     Subjective:  Didn't sleep well. weak  Objective Vitals:   01/16/21 2025 01/17/21 0415 01/17/21 0817 01/17/21 0837  BP: 129/88 (!) 141/84 (!) 146/71 (!) 125/55  Pulse: 91 86 87 98  Resp: 18 18 18 18   Temp: 98 F (36.7 C) 98 F (36.7 C) 98.8 F (37.1 C) 97.8 F (36.6 C)  TempSrc:   Oral Oral  SpO2: 92% 93% 94% 94%  Weight:      Height:       65.4 kg  Vital signs were reviewed and unremarkable.   Exam Physical Exam   General appearance: alert, cooperative, and confused Resp: clear to auscultation bilaterally Cardio: regular rate and rhythm, S1, S2 normal, no murmur, click, rub or gallop GI: soft, non-tender; bowel sounds normal; no masses,  no organomegaly Extremities: no edema, significant tremor  Labs / Other Information There are no new results to review at this time.   Disposition Plan: Status is: Inpatient  Remains inpatient appropriate because: SNF discharge tomorrow   Son updated at bedside     Time spent: 20 minutes [facility] 01/17/2021, 12:01 PM

## 2021-01-17 NOTE — Assessment & Plan Note (Signed)
Treated with antibiotics °

## 2021-01-18 DIAGNOSIS — Z85528 Personal history of other malignant neoplasm of kidney: Secondary | ICD-10-CM | POA: Diagnosis not present

## 2021-01-18 DIAGNOSIS — Z515 Encounter for palliative care: Secondary | ICD-10-CM | POA: Diagnosis not present

## 2021-01-18 DIAGNOSIS — E161 Other hypoglycemia: Secondary | ICD-10-CM | POA: Diagnosis not present

## 2021-01-18 DIAGNOSIS — Z23 Encounter for immunization: Secondary | ICD-10-CM | POA: Diagnosis not present

## 2021-01-18 DIAGNOSIS — U071 COVID-19: Secondary | ICD-10-CM | POA: Diagnosis not present

## 2021-01-18 DIAGNOSIS — I2510 Atherosclerotic heart disease of native coronary artery without angina pectoris: Secondary | ICD-10-CM | POA: Diagnosis not present

## 2021-01-18 DIAGNOSIS — K219 Gastro-esophageal reflux disease without esophagitis: Secondary | ICD-10-CM | POA: Diagnosis not present

## 2021-01-18 DIAGNOSIS — Z741 Need for assistance with personal care: Secondary | ICD-10-CM | POA: Diagnosis not present

## 2021-01-18 DIAGNOSIS — G629 Polyneuropathy, unspecified: Secondary | ICD-10-CM | POA: Diagnosis not present

## 2021-01-18 DIAGNOSIS — Z8616 Personal history of COVID-19: Secondary | ICD-10-CM | POA: Diagnosis not present

## 2021-01-18 DIAGNOSIS — I10 Essential (primary) hypertension: Secondary | ICD-10-CM | POA: Diagnosis not present

## 2021-01-18 DIAGNOSIS — I1310 Hypertensive heart and chronic kidney disease without heart failure, with stage 1 through stage 4 chronic kidney disease, or unspecified chronic kidney disease: Secondary | ICD-10-CM | POA: Diagnosis not present

## 2021-01-18 DIAGNOSIS — Z7401 Bed confinement status: Secondary | ICD-10-CM | POA: Diagnosis not present

## 2021-01-18 DIAGNOSIS — F03C11 Unspecified dementia, severe, with agitation: Secondary | ICD-10-CM | POA: Diagnosis not present

## 2021-01-18 DIAGNOSIS — J441 Chronic obstructive pulmonary disease with (acute) exacerbation: Secondary | ICD-10-CM | POA: Diagnosis not present

## 2021-01-18 DIAGNOSIS — R2681 Unsteadiness on feet: Secondary | ICD-10-CM | POA: Diagnosis not present

## 2021-01-18 DIAGNOSIS — M6259 Muscle wasting and atrophy, not elsewhere classified, multiple sites: Secondary | ICD-10-CM | POA: Diagnosis not present

## 2021-01-18 DIAGNOSIS — K2100 Gastro-esophageal reflux disease with esophagitis, without bleeding: Secondary | ICD-10-CM | POA: Diagnosis not present

## 2021-01-18 DIAGNOSIS — M6281 Muscle weakness (generalized): Secondary | ICD-10-CM | POA: Diagnosis not present

## 2021-01-18 DIAGNOSIS — E119 Type 2 diabetes mellitus without complications: Secondary | ICD-10-CM | POA: Diagnosis not present

## 2021-01-18 DIAGNOSIS — E162 Hypoglycemia, unspecified: Secondary | ICD-10-CM | POA: Diagnosis not present

## 2021-01-18 DIAGNOSIS — J9601 Acute respiratory failure with hypoxia: Secondary | ICD-10-CM | POA: Diagnosis not present

## 2021-01-18 DIAGNOSIS — R32 Unspecified urinary incontinence: Secondary | ICD-10-CM | POA: Diagnosis not present

## 2021-01-18 DIAGNOSIS — J690 Pneumonitis due to inhalation of food and vomit: Secondary | ICD-10-CM | POA: Diagnosis not present

## 2021-01-18 DIAGNOSIS — G9341 Metabolic encephalopathy: Secondary | ICD-10-CM | POA: Diagnosis not present

## 2021-01-18 DIAGNOSIS — Z743 Need for continuous supervision: Secondary | ICD-10-CM | POA: Diagnosis not present

## 2021-01-18 DIAGNOSIS — J449 Chronic obstructive pulmonary disease, unspecified: Secondary | ICD-10-CM | POA: Diagnosis not present

## 2021-01-18 LAB — GLUCOSE, CAPILLARY
Glucose-Capillary: 121 mg/dL — ABNORMAL HIGH (ref 70–99)
Glucose-Capillary: 191 mg/dL — ABNORMAL HIGH (ref 70–99)

## 2021-01-18 NOTE — Progress Notes (Signed)
[facility]) Hospital Liaison note:  This patient is currently enrolled in [facility] outpatient-based Palliative Care. Will continue to follow for disposition.  Please call with any outpatient palliative questions or concerns.  Thank you, Lorelee Market, LPN [facility] Liaison [phone]

## 2021-01-18 NOTE — Care Management Important Message (Signed)
Important Message  Patient Details  Name: Todd Davidson MRN: [id number] Date of Birth: 1936-01-21   Medicare Important Message Given:  Yes  Reviewed Medicare IM with Brynda Rim, brother, at [id number].     [name] 01/18/2021, 11:30 AM

## 2021-01-18 NOTE — Progress Notes (Signed)
Physical Therapy Treatment Patient Details Name: Todd Davidson MRN: [id number] DOB: 1935-04-22 Today's Date: 01/18/2021   History of Present Illness Pt is an 85 y.o. M arriving to ED after being found on the floor by a family member and admitted for metabolic encephalopahty, acute respiratory failure, and aspiration pneumonia. PMH includes DM, hx prostate cancer, hx of dementia, COPD,    PT Comments    Patient is lethargic today but able to follow commands for participation with therapeutic exercises of UE and LE for strengthening.  No shortness of breath or pain with with bed level activity. Volitional tremor of RUE improved with facilitation of weight bearing and joint approximation. Patient continues to have generalized weakness and would benefit from continued PT. SNF discharge is anticipated soon.    Recommendations for follow up therapy are one component of a multi-disciplinary discharge planning process, led by the attending physician.  Recommendations may be updated based on patient status, additional functional criteria and insurance authorization.  Follow Up Recommendations  Skilled nursing-short term rehab (<3 hours/day)     Assistance Recommended at Discharge Frequent or constant Supervision/Assistance  Equipment Recommendations  Other (comment) (to be determined at next level of care)    Recommendations for Other Services       Precautions / Restrictions Precautions Precautions: Fall Restrictions Weight Bearing Restrictions: No     Mobility  Bed Mobility               General bed mobility comments: not attempted as patient lethargic during session. patient agreeable to LE exercises    Transfers                        Ambulation/Gait                 Stairs             Wheelchair Mobility    Modified Rankin (Stroke Patients Only)       Balance                                            Cognition  Arousal/Alertness: Lethargic Behavior During Therapy: Flat affect Overall Cognitive Status: History of cognitive impairments - at baseline                                 General Comments: patient able to follow single step commands consistently        Exercises General Exercises - Upper Extremity Shoulder Flexion: AAROM;Strengthening;Both;10 reps;Supine (to shoulder height) Elbow Flexion: AAROM;Strengthening;Both;10 reps;Supine Elbow Extension: AAROM;Both;10 reps;Supine Wrist Extension: AAROM;Strengthening;Both;10 reps;Supine General Exercises - Lower Extremity Ankle Circles/Pumps: AAROM;Strengthening;Both;10 reps (AAROM-PROM RLE) Hip ABduction/ADduction: AAROM;Strengthening;Both;10 reps;Supine Straight Leg Raises: AAROM;Strengthening;Both;10 reps;Supine Other Exercises Other Exercises: verbal and tactile cues for exercise technique for strenghening of UE and LE. noted intermittent tremor of RUE that worsens with volitional movement and improves with joint approximation and faciliation of weight bearing.    General Comments General comments (skin integrity, edema, etc.): family member in the room had questions about patient's tremor as well as questions about what to expect from therapy at rehab.      Pertinent Vitals/Pain Pain Assessment: No/denies pain    Home Living  Prior Function            PT Goals (current goals can now be found in the care plan section) Acute Rehab PT Goals Patient Stated Goal: none stated Progress towards PT goals: Progressing toward goals    Frequency    Min 2X/week      PT Plan Current plan remains appropriate    Co-evaluation              AM-PAC PT "6 Clicks" Mobility   Outcome Measure  Help needed turning from your back to your side while in a flat bed without using bedrails?: A Little Help needed moving from lying on your back to sitting on the side of a flat bed without  using bedrails?: A Little Help needed moving to and from a bed to a chair (including a wheelchair)?: A Lot Help needed standing up from a chair using your arms (e.g., wheelchair or bedside chair)?: A Lot Help needed to walk in hospital room?: A Little Help needed climbing 3-5 steps with a railing? : A Lot 6 Click Score: 15    End of Session   Activity Tolerance: Patient limited by lethargy Patient left: in bed;with call bell/phone within reach;with bed alarm set;with family/visitor present   PT Visit Diagnosis: Other abnormalities of gait and mobility (R26.89);Muscle weakness (generalized) (M62.81);History of falling (Z91.81)     Time: 0937-1000 PT Time Calculation (min) (ACUTE ONLY): 23 min  Charges:  $Therapeutic Exercise: 23-37 mins                     Minna Merritts, PT, MPT    [name] 01/18/2021, 10:13 AM

## 2021-01-18 NOTE — TOC Transition Note (Signed)
Transition of Care [facility]) - CM/SW Discharge Note   Patient Details  Name: Todd Davidson MRN: [id number] Date of Birth: 10/28/1935  [facility]) CM/SW Contact:  Candie Chroman, LCSW Phone Number: 01/18/2021, 9:13 AM   Clinical Narrative:  Patient has orders to discharge to [facility] today. RN will call report to [phone] (Room 705). EMS transport has been arranged and he is 2nd on the list. No further concerns. CSW signing off.   Final next level of care: [place] Barriers to Discharge: Barriers Resolved   Patient Goals and CMS Choice     Choice offered to / list presented to : Sibling  Discharge Placement   Existing PASRR number confirmed : 01/05/21          Patient chooses bed at: [facility] [place] Patient to be transferred to facility by: EMS Name of family member notified: Barrie Wale Patient and family notified of of transfer: 01/18/21  Discharge Plan and Services     Post Acute Care Choice: [place]                               Social Determinants of Health (SDOH) Interventions     Readmission Risk Interventions Readmission Risk Prevention Plan 11/05/2018  Post Dischage Appt Not Complete  Appt Comments Pt discharging to SNF  Medication Screening Complete  Transportation Screening Complete  Some recent data might be hidden

## 2021-01-19 DIAGNOSIS — I2510 Atherosclerotic heart disease of native coronary artery without angina pectoris: Secondary | ICD-10-CM | POA: Diagnosis not present

## 2021-01-19 DIAGNOSIS — K2100 Gastro-esophageal reflux disease with esophagitis, without bleeding: Secondary | ICD-10-CM | POA: Diagnosis not present

## 2021-01-19 DIAGNOSIS — I1310 Hypertensive heart and chronic kidney disease without heart failure, with stage 1 through stage 4 chronic kidney disease, or unspecified chronic kidney disease: Secondary | ICD-10-CM | POA: Diagnosis not present

## 2021-01-19 DIAGNOSIS — J449 Chronic obstructive pulmonary disease, unspecified: Secondary | ICD-10-CM | POA: Diagnosis not present

## 2021-01-19 DIAGNOSIS — G629 Polyneuropathy, unspecified: Secondary | ICD-10-CM | POA: Diagnosis not present

## 2021-01-22 DIAGNOSIS — J449 Chronic obstructive pulmonary disease, unspecified: Secondary | ICD-10-CM | POA: Diagnosis not present

## 2021-01-22 DIAGNOSIS — I1310 Hypertensive heart and chronic kidney disease without heart failure, with stage 1 through stage 4 chronic kidney disease, or unspecified chronic kidney disease: Secondary | ICD-10-CM | POA: Diagnosis not present

## 2021-01-22 DIAGNOSIS — I2510 Atherosclerotic heart disease of native coronary artery without angina pectoris: Secondary | ICD-10-CM | POA: Diagnosis not present

## 2021-01-26 DIAGNOSIS — J449 Chronic obstructive pulmonary disease, unspecified: Secondary | ICD-10-CM | POA: Diagnosis not present

## 2021-01-26 DIAGNOSIS — I2510 Atherosclerotic heart disease of native coronary artery without angina pectoris: Secondary | ICD-10-CM | POA: Diagnosis not present

## 2021-01-28 DIAGNOSIS — I2510 Atherosclerotic heart disease of native coronary artery without angina pectoris: Secondary | ICD-10-CM | POA: Diagnosis not present

## 2021-01-28 DIAGNOSIS — I1310 Hypertensive heart and chronic kidney disease without heart failure, with stage 1 through stage 4 chronic kidney disease, or unspecified chronic kidney disease: Secondary | ICD-10-CM | POA: Diagnosis not present

## 2021-01-28 DIAGNOSIS — J449 Chronic obstructive pulmonary disease, unspecified: Secondary | ICD-10-CM | POA: Diagnosis not present

## 2021-02-01 DIAGNOSIS — J449 Chronic obstructive pulmonary disease, unspecified: Secondary | ICD-10-CM | POA: Diagnosis not present

## 2021-02-01 DIAGNOSIS — M6281 Muscle weakness (generalized): Secondary | ICD-10-CM | POA: Diagnosis not present

## 2021-02-01 DIAGNOSIS — I2510 Atherosclerotic heart disease of native coronary artery without angina pectoris: Secondary | ICD-10-CM | POA: Diagnosis not present

## 2021-02-03 ENCOUNTER — Other Ambulatory Visit: Payer: Self-pay | Admitting: Interventional Radiology

## 2021-02-03 DIAGNOSIS — C642 Malignant neoplasm of left kidney, except renal pelvis: Secondary | ICD-10-CM

## 2021-02-09 DIAGNOSIS — M25532 Pain in left wrist: Secondary | ICD-10-CM | POA: Diagnosis not present

## 2021-02-09 DIAGNOSIS — M79642 Pain in left hand: Secondary | ICD-10-CM | POA: Diagnosis not present

## 2021-02-10 ENCOUNTER — Other Ambulatory Visit: Payer: Self-pay | Admitting: Nurse Practitioner

## 2021-02-10 ENCOUNTER — Ambulatory Visit
Admission: RE | Admit: 2021-02-10 | Discharge: 2021-02-10 | Disposition: A | Discharge status: Home / Self Care | Payer: Medicare Other | Source: Ambulatory Visit | Attending: Nurse Practitioner | Admitting: Nurse Practitioner

## 2021-02-10 ENCOUNTER — Ambulatory Visit
Admission: RE | Admit: 2021-02-10 | Discharge: 2021-02-10 | Disposition: A | Discharge status: Home / Self Care | Payer: Medicare Other | Attending: Nurse Practitioner | Admitting: Nurse Practitioner

## 2021-02-10 ENCOUNTER — Other Ambulatory Visit: Payer: Self-pay

## 2021-02-10 DIAGNOSIS — R0600 Dyspnea, unspecified: Secondary | ICD-10-CM | POA: Insufficient documentation

## 2021-02-10 DIAGNOSIS — J449 Chronic obstructive pulmonary disease, unspecified: Secondary | ICD-10-CM | POA: Diagnosis not present

## 2021-02-10 DIAGNOSIS — M1990 Unspecified osteoarthritis, unspecified site: Secondary | ICD-10-CM | POA: Diagnosis not present

## 2021-02-10 DIAGNOSIS — E11649 Type 2 diabetes mellitus with hypoglycemia without coma: Secondary | ICD-10-CM | POA: Diagnosis not present

## 2021-02-10 DIAGNOSIS — J690 Pneumonitis due to inhalation of food and vomit: Secondary | ICD-10-CM | POA: Diagnosis not present

## 2021-02-10 DIAGNOSIS — F32A Depression, unspecified: Secondary | ICD-10-CM | POA: Diagnosis not present

## 2021-02-10 DIAGNOSIS — R0602 Shortness of breath: Secondary | ICD-10-CM | POA: Diagnosis not present

## 2021-02-16 ENCOUNTER — Ambulatory Visit: Payer: Medicare HMO | Admitting: Internal Medicine

## 2021-02-23 DIAGNOSIS — E11649 Type 2 diabetes mellitus with hypoglycemia without coma: Secondary | ICD-10-CM | POA: Diagnosis not present

## 2021-02-23 DIAGNOSIS — K219 Gastro-esophageal reflux disease without esophagitis: Secondary | ICD-10-CM | POA: Diagnosis not present

## 2021-02-23 DIAGNOSIS — I2510 Atherosclerotic heart disease of native coronary artery without angina pectoris: Secondary | ICD-10-CM | POA: Diagnosis not present

## 2021-02-23 DIAGNOSIS — M5030 Other cervical disc degeneration, unspecified cervical region: Secondary | ICD-10-CM | POA: Diagnosis not present

## 2021-02-23 DIAGNOSIS — M1711 Unilateral primary osteoarthritis, right knee: Secondary | ICD-10-CM | POA: Diagnosis not present

## 2021-02-23 DIAGNOSIS — F32A Depression, unspecified: Secondary | ICD-10-CM | POA: Diagnosis not present

## 2021-02-23 DIAGNOSIS — D696 Thrombocytopenia, unspecified: Secondary | ICD-10-CM | POA: Diagnosis not present

## 2021-02-23 DIAGNOSIS — E785 Hyperlipidemia, unspecified: Secondary | ICD-10-CM | POA: Diagnosis not present

## 2021-02-23 DIAGNOSIS — G2581 Restless legs syndrome: Secondary | ICD-10-CM | POA: Diagnosis not present

## 2021-02-23 DIAGNOSIS — F0393 Unspecified dementia, unspecified severity, with mood disturbance: Secondary | ICD-10-CM | POA: Diagnosis not present

## 2021-02-23 DIAGNOSIS — F03911 Unspecified dementia, unspecified severity, with agitation: Secondary | ICD-10-CM | POA: Diagnosis not present

## 2021-02-23 DIAGNOSIS — H8110 Benign paroxysmal vertigo, unspecified ear: Secondary | ICD-10-CM | POA: Diagnosis not present

## 2021-02-23 DIAGNOSIS — E1122 Type 2 diabetes mellitus with diabetic chronic kidney disease: Secondary | ICD-10-CM | POA: Diagnosis not present

## 2021-02-23 DIAGNOSIS — Z7982 Long term (current) use of aspirin: Secondary | ICD-10-CM | POA: Diagnosis not present

## 2021-02-23 DIAGNOSIS — J441 Chronic obstructive pulmonary disease with (acute) exacerbation: Secondary | ICD-10-CM | POA: Diagnosis not present

## 2021-02-23 DIAGNOSIS — Z8616 Personal history of COVID-19: Secondary | ICD-10-CM | POA: Diagnosis not present

## 2021-02-23 DIAGNOSIS — G4739 Other sleep apnea: Secondary | ICD-10-CM | POA: Diagnosis not present

## 2021-02-23 DIAGNOSIS — N189 Chronic kidney disease, unspecified: Secondary | ICD-10-CM | POA: Diagnosis not present

## 2021-02-23 DIAGNOSIS — I129 Hypertensive chronic kidney disease with stage 1 through stage 4 chronic kidney disease, or unspecified chronic kidney disease: Secondary | ICD-10-CM | POA: Diagnosis not present

## 2021-02-23 DIAGNOSIS — G9341 Metabolic encephalopathy: Secondary | ICD-10-CM | POA: Diagnosis not present

## 2021-02-23 DIAGNOSIS — F0394 Unspecified dementia, unspecified severity, with anxiety: Secondary | ICD-10-CM | POA: Diagnosis not present

## 2021-02-23 DIAGNOSIS — Z87891 Personal history of nicotine dependence: Secondary | ICD-10-CM | POA: Diagnosis not present

## 2021-02-25 DIAGNOSIS — M5030 Other cervical disc degeneration, unspecified cervical region: Secondary | ICD-10-CM | POA: Diagnosis not present

## 2021-02-25 DIAGNOSIS — Z87891 Personal history of nicotine dependence: Secondary | ICD-10-CM | POA: Diagnosis not present

## 2021-02-25 DIAGNOSIS — F32A Depression, unspecified: Secondary | ICD-10-CM | POA: Diagnosis not present

## 2021-02-25 DIAGNOSIS — D696 Thrombocytopenia, unspecified: Secondary | ICD-10-CM | POA: Diagnosis not present

## 2021-02-25 DIAGNOSIS — F03911 Unspecified dementia, unspecified severity, with agitation: Secondary | ICD-10-CM | POA: Diagnosis not present

## 2021-02-25 DIAGNOSIS — E11649 Type 2 diabetes mellitus with hypoglycemia without coma: Secondary | ICD-10-CM | POA: Diagnosis not present

## 2021-02-25 DIAGNOSIS — E785 Hyperlipidemia, unspecified: Secondary | ICD-10-CM | POA: Diagnosis not present

## 2021-02-25 DIAGNOSIS — Z8616 Personal history of COVID-19: Secondary | ICD-10-CM | POA: Diagnosis not present

## 2021-02-25 DIAGNOSIS — F0394 Unspecified dementia, unspecified severity, with anxiety: Secondary | ICD-10-CM | POA: Diagnosis not present

## 2021-02-25 DIAGNOSIS — J441 Chronic obstructive pulmonary disease with (acute) exacerbation: Secondary | ICD-10-CM | POA: Diagnosis not present

## 2021-02-25 DIAGNOSIS — E1122 Type 2 diabetes mellitus with diabetic chronic kidney disease: Secondary | ICD-10-CM | POA: Diagnosis not present

## 2021-02-25 DIAGNOSIS — N189 Chronic kidney disease, unspecified: Secondary | ICD-10-CM | POA: Diagnosis not present

## 2021-02-25 DIAGNOSIS — G9341 Metabolic encephalopathy: Secondary | ICD-10-CM | POA: Diagnosis not present

## 2021-02-25 DIAGNOSIS — F0393 Unspecified dementia, unspecified severity, with mood disturbance: Secondary | ICD-10-CM | POA: Diagnosis not present

## 2021-02-25 DIAGNOSIS — M1711 Unilateral primary osteoarthritis, right knee: Secondary | ICD-10-CM | POA: Diagnosis not present

## 2021-02-25 DIAGNOSIS — Z7982 Long term (current) use of aspirin: Secondary | ICD-10-CM | POA: Diagnosis not present

## 2021-02-25 DIAGNOSIS — G2581 Restless legs syndrome: Secondary | ICD-10-CM | POA: Diagnosis not present

## 2021-02-25 DIAGNOSIS — I2510 Atherosclerotic heart disease of native coronary artery without angina pectoris: Secondary | ICD-10-CM | POA: Diagnosis not present

## 2021-02-25 DIAGNOSIS — G4739 Other sleep apnea: Secondary | ICD-10-CM | POA: Diagnosis not present

## 2021-02-25 DIAGNOSIS — I129 Hypertensive chronic kidney disease with stage 1 through stage 4 chronic kidney disease, or unspecified chronic kidney disease: Secondary | ICD-10-CM | POA: Diagnosis not present

## 2021-02-25 DIAGNOSIS — K219 Gastro-esophageal reflux disease without esophagitis: Secondary | ICD-10-CM | POA: Diagnosis not present

## 2021-02-25 DIAGNOSIS — H8110 Benign paroxysmal vertigo, unspecified ear: Secondary | ICD-10-CM | POA: Diagnosis not present

## 2021-03-02 ENCOUNTER — Telehealth: Payer: Self-pay | Admitting: Student

## 2021-03-02 DIAGNOSIS — E785 Hyperlipidemia, unspecified: Secondary | ICD-10-CM | POA: Diagnosis not present

## 2021-03-02 DIAGNOSIS — D696 Thrombocytopenia, unspecified: Secondary | ICD-10-CM | POA: Diagnosis not present

## 2021-03-02 DIAGNOSIS — F32A Depression, unspecified: Secondary | ICD-10-CM | POA: Diagnosis not present

## 2021-03-02 DIAGNOSIS — F0394 Unspecified dementia, unspecified severity, with anxiety: Secondary | ICD-10-CM | POA: Diagnosis not present

## 2021-03-02 DIAGNOSIS — Z87891 Personal history of nicotine dependence: Secondary | ICD-10-CM | POA: Diagnosis not present

## 2021-03-02 DIAGNOSIS — N189 Chronic kidney disease, unspecified: Secondary | ICD-10-CM | POA: Diagnosis not present

## 2021-03-02 DIAGNOSIS — M1711 Unilateral primary osteoarthritis, right knee: Secondary | ICD-10-CM | POA: Diagnosis not present

## 2021-03-02 DIAGNOSIS — E11649 Type 2 diabetes mellitus with hypoglycemia without coma: Secondary | ICD-10-CM | POA: Diagnosis not present

## 2021-03-02 DIAGNOSIS — G2581 Restless legs syndrome: Secondary | ICD-10-CM | POA: Diagnosis not present

## 2021-03-02 DIAGNOSIS — Z7982 Long term (current) use of aspirin: Secondary | ICD-10-CM | POA: Diagnosis not present

## 2021-03-02 DIAGNOSIS — Z8616 Personal history of COVID-19: Secondary | ICD-10-CM | POA: Diagnosis not present

## 2021-03-02 DIAGNOSIS — F0393 Unspecified dementia, unspecified severity, with mood disturbance: Secondary | ICD-10-CM | POA: Diagnosis not present

## 2021-03-02 DIAGNOSIS — E1122 Type 2 diabetes mellitus with diabetic chronic kidney disease: Secondary | ICD-10-CM | POA: Diagnosis not present

## 2021-03-02 DIAGNOSIS — J441 Chronic obstructive pulmonary disease with (acute) exacerbation: Secondary | ICD-10-CM | POA: Diagnosis not present

## 2021-03-02 DIAGNOSIS — M5030 Other cervical disc degeneration, unspecified cervical region: Secondary | ICD-10-CM | POA: Diagnosis not present

## 2021-03-02 DIAGNOSIS — I2510 Atherosclerotic heart disease of native coronary artery without angina pectoris: Secondary | ICD-10-CM | POA: Diagnosis not present

## 2021-03-02 DIAGNOSIS — I129 Hypertensive chronic kidney disease with stage 1 through stage 4 chronic kidney disease, or unspecified chronic kidney disease: Secondary | ICD-10-CM | POA: Diagnosis not present

## 2021-03-02 DIAGNOSIS — K219 Gastro-esophageal reflux disease without esophagitis: Secondary | ICD-10-CM | POA: Diagnosis not present

## 2021-03-02 DIAGNOSIS — G9341 Metabolic encephalopathy: Secondary | ICD-10-CM | POA: Diagnosis not present

## 2021-03-02 DIAGNOSIS — F03911 Unspecified dementia, unspecified severity, with agitation: Secondary | ICD-10-CM | POA: Diagnosis not present

## 2021-03-02 DIAGNOSIS — G4739 Other sleep apnea: Secondary | ICD-10-CM | POA: Diagnosis not present

## 2021-03-02 DIAGNOSIS — H8110 Benign paroxysmal vertigo, unspecified ear: Secondary | ICD-10-CM | POA: Diagnosis not present

## 2021-03-02 NOTE — Telephone Encounter (Signed)
Palliative NP spoke with patient's brother [name] regarding scheduled follow up visit. Patient has moved to [facility]. He is okay with palliative services continuing. Will reach out to PCP to continue services at [facility].

## 2021-03-04 DIAGNOSIS — G2581 Restless legs syndrome: Secondary | ICD-10-CM | POA: Diagnosis not present

## 2021-03-04 DIAGNOSIS — H8110 Benign paroxysmal vertigo, unspecified ear: Secondary | ICD-10-CM | POA: Diagnosis not present

## 2021-03-04 DIAGNOSIS — Z7982 Long term (current) use of aspirin: Secondary | ICD-10-CM | POA: Diagnosis not present

## 2021-03-04 DIAGNOSIS — F0393 Unspecified dementia, unspecified severity, with mood disturbance: Secondary | ICD-10-CM | POA: Diagnosis not present

## 2021-03-04 DIAGNOSIS — M5030 Other cervical disc degeneration, unspecified cervical region: Secondary | ICD-10-CM | POA: Diagnosis not present

## 2021-03-04 DIAGNOSIS — G9341 Metabolic encephalopathy: Secondary | ICD-10-CM | POA: Diagnosis not present

## 2021-03-04 DIAGNOSIS — F03911 Unspecified dementia, unspecified severity, with agitation: Secondary | ICD-10-CM | POA: Diagnosis not present

## 2021-03-04 DIAGNOSIS — D696 Thrombocytopenia, unspecified: Secondary | ICD-10-CM | POA: Diagnosis not present

## 2021-03-04 DIAGNOSIS — E785 Hyperlipidemia, unspecified: Secondary | ICD-10-CM | POA: Diagnosis not present

## 2021-03-04 DIAGNOSIS — E11649 Type 2 diabetes mellitus with hypoglycemia without coma: Secondary | ICD-10-CM | POA: Diagnosis not present

## 2021-03-04 DIAGNOSIS — G4739 Other sleep apnea: Secondary | ICD-10-CM | POA: Diagnosis not present

## 2021-03-04 DIAGNOSIS — N189 Chronic kidney disease, unspecified: Secondary | ICD-10-CM | POA: Diagnosis not present

## 2021-03-04 DIAGNOSIS — Z8616 Personal history of COVID-19: Secondary | ICD-10-CM | POA: Diagnosis not present

## 2021-03-04 DIAGNOSIS — I129 Hypertensive chronic kidney disease with stage 1 through stage 4 chronic kidney disease, or unspecified chronic kidney disease: Secondary | ICD-10-CM | POA: Diagnosis not present

## 2021-03-04 DIAGNOSIS — F32A Depression, unspecified: Secondary | ICD-10-CM | POA: Diagnosis not present

## 2021-03-04 DIAGNOSIS — I2510 Atherosclerotic heart disease of native coronary artery without angina pectoris: Secondary | ICD-10-CM | POA: Diagnosis not present

## 2021-03-04 DIAGNOSIS — J441 Chronic obstructive pulmonary disease with (acute) exacerbation: Secondary | ICD-10-CM | POA: Diagnosis not present

## 2021-03-04 DIAGNOSIS — F0394 Unspecified dementia, unspecified severity, with anxiety: Secondary | ICD-10-CM | POA: Diagnosis not present

## 2021-03-04 DIAGNOSIS — K219 Gastro-esophageal reflux disease without esophagitis: Secondary | ICD-10-CM | POA: Diagnosis not present

## 2021-03-04 DIAGNOSIS — M1711 Unilateral primary osteoarthritis, right knee: Secondary | ICD-10-CM | POA: Diagnosis not present

## 2021-03-04 DIAGNOSIS — E1122 Type 2 diabetes mellitus with diabetic chronic kidney disease: Secondary | ICD-10-CM | POA: Diagnosis not present

## 2021-03-04 DIAGNOSIS — Z87891 Personal history of nicotine dependence: Secondary | ICD-10-CM | POA: Diagnosis not present

## 2021-03-05 ENCOUNTER — Ambulatory Visit: Payer: Medicare Other | Admitting: Urology

## 2021-03-05 DIAGNOSIS — G629 Polyneuropathy, unspecified: Secondary | ICD-10-CM | POA: Diagnosis not present

## 2021-03-05 DIAGNOSIS — F03B18 Unspecified dementia, moderate, with other behavioral disturbance: Secondary | ICD-10-CM | POA: Diagnosis not present

## 2021-03-05 DIAGNOSIS — R251 Tremor, unspecified: Secondary | ICD-10-CM | POA: Diagnosis not present

## 2021-03-05 DIAGNOSIS — Z8673 Personal history of transient ischemic attack (TIA), and cerebral infarction without residual deficits: Secondary | ICD-10-CM | POA: Diagnosis not present

## 2021-03-09 DIAGNOSIS — N189 Chronic kidney disease, unspecified: Secondary | ICD-10-CM | POA: Diagnosis not present

## 2021-03-09 DIAGNOSIS — H8110 Benign paroxysmal vertigo, unspecified ear: Secondary | ICD-10-CM | POA: Diagnosis not present

## 2021-03-09 DIAGNOSIS — Z7982 Long term (current) use of aspirin: Secondary | ICD-10-CM | POA: Diagnosis not present

## 2021-03-09 DIAGNOSIS — G4739 Other sleep apnea: Secondary | ICD-10-CM | POA: Diagnosis not present

## 2021-03-09 DIAGNOSIS — I129 Hypertensive chronic kidney disease with stage 1 through stage 4 chronic kidney disease, or unspecified chronic kidney disease: Secondary | ICD-10-CM | POA: Diagnosis not present

## 2021-03-09 DIAGNOSIS — E785 Hyperlipidemia, unspecified: Secondary | ICD-10-CM | POA: Diagnosis not present

## 2021-03-09 DIAGNOSIS — J441 Chronic obstructive pulmonary disease with (acute) exacerbation: Secondary | ICD-10-CM | POA: Diagnosis not present

## 2021-03-09 DIAGNOSIS — E11649 Type 2 diabetes mellitus with hypoglycemia without coma: Secondary | ICD-10-CM | POA: Diagnosis not present

## 2021-03-09 DIAGNOSIS — E1122 Type 2 diabetes mellitus with diabetic chronic kidney disease: Secondary | ICD-10-CM | POA: Diagnosis not present

## 2021-03-09 DIAGNOSIS — F0393 Unspecified dementia, unspecified severity, with mood disturbance: Secondary | ICD-10-CM | POA: Diagnosis not present

## 2021-03-09 DIAGNOSIS — K219 Gastro-esophageal reflux disease without esophagitis: Secondary | ICD-10-CM | POA: Diagnosis not present

## 2021-03-09 DIAGNOSIS — D696 Thrombocytopenia, unspecified: Secondary | ICD-10-CM | POA: Diagnosis not present

## 2021-03-09 DIAGNOSIS — I2510 Atherosclerotic heart disease of native coronary artery without angina pectoris: Secondary | ICD-10-CM | POA: Diagnosis not present

## 2021-03-09 DIAGNOSIS — F0394 Unspecified dementia, unspecified severity, with anxiety: Secondary | ICD-10-CM | POA: Diagnosis not present

## 2021-03-09 DIAGNOSIS — Z87891 Personal history of nicotine dependence: Secondary | ICD-10-CM | POA: Diagnosis not present

## 2021-03-09 DIAGNOSIS — G2581 Restless legs syndrome: Secondary | ICD-10-CM | POA: Diagnosis not present

## 2021-03-09 DIAGNOSIS — Z8616 Personal history of COVID-19: Secondary | ICD-10-CM | POA: Diagnosis not present

## 2021-03-09 DIAGNOSIS — M5030 Other cervical disc degeneration, unspecified cervical region: Secondary | ICD-10-CM | POA: Diagnosis not present

## 2021-03-09 DIAGNOSIS — G9341 Metabolic encephalopathy: Secondary | ICD-10-CM | POA: Diagnosis not present

## 2021-03-09 DIAGNOSIS — F32A Depression, unspecified: Secondary | ICD-10-CM | POA: Diagnosis not present

## 2021-03-09 DIAGNOSIS — F03911 Unspecified dementia, unspecified severity, with agitation: Secondary | ICD-10-CM | POA: Diagnosis not present

## 2021-03-09 DIAGNOSIS — M1711 Unilateral primary osteoarthritis, right knee: Secondary | ICD-10-CM | POA: Diagnosis not present

## 2021-03-10 DIAGNOSIS — E1165 Type 2 diabetes mellitus with hyperglycemia: Secondary | ICD-10-CM | POA: Diagnosis not present

## 2021-03-10 DIAGNOSIS — F32A Depression, unspecified: Secondary | ICD-10-CM | POA: Diagnosis not present

## 2021-03-10 DIAGNOSIS — M1991 Primary osteoarthritis, unspecified site: Secondary | ICD-10-CM | POA: Diagnosis not present

## 2021-03-10 DIAGNOSIS — I10 Essential (primary) hypertension: Secondary | ICD-10-CM | POA: Diagnosis not present

## 2021-03-11 ENCOUNTER — Inpatient Hospital Stay: Payer: Medicare Other | Attending: Radiation Oncology

## 2021-03-12 ENCOUNTER — Other Ambulatory Visit: Payer: Self-pay

## 2021-03-12 ENCOUNTER — Ambulatory Visit
Admission: RE | Admit: 2021-03-12 | Discharge: 2021-03-12 | Disposition: A | Discharge status: Home / Self Care | Payer: Medicare Other | Source: Ambulatory Visit | Attending: Interventional Radiology | Admitting: Interventional Radiology

## 2021-03-12 ENCOUNTER — Non-Acute Institutional Stay: Payer: Medicare Other | Admitting: Student

## 2021-03-12 DIAGNOSIS — C642 Malignant neoplasm of left kidney, except renal pelvis: Secondary | ICD-10-CM | POA: Insufficient documentation

## 2021-03-12 DIAGNOSIS — I700 Atherosclerosis of aorta: Secondary | ICD-10-CM | POA: Diagnosis not present

## 2021-03-12 DIAGNOSIS — E1122 Type 2 diabetes mellitus with diabetic chronic kidney disease: Secondary | ICD-10-CM | POA: Diagnosis not present

## 2021-03-12 DIAGNOSIS — F0393 Unspecified dementia, unspecified severity, with mood disturbance: Secondary | ICD-10-CM | POA: Diagnosis not present

## 2021-03-12 DIAGNOSIS — G4739 Other sleep apnea: Secondary | ICD-10-CM | POA: Diagnosis not present

## 2021-03-12 DIAGNOSIS — F03911 Unspecified dementia, unspecified severity, with agitation: Secondary | ICD-10-CM | POA: Diagnosis not present

## 2021-03-12 DIAGNOSIS — N281 Cyst of kidney, acquired: Secondary | ICD-10-CM | POA: Diagnosis not present

## 2021-03-12 DIAGNOSIS — Z87891 Personal history of nicotine dependence: Secondary | ICD-10-CM | POA: Diagnosis not present

## 2021-03-12 DIAGNOSIS — I2510 Atherosclerotic heart disease of native coronary artery without angina pectoris: Secondary | ICD-10-CM | POA: Diagnosis not present

## 2021-03-12 DIAGNOSIS — G2581 Restless legs syndrome: Secondary | ICD-10-CM | POA: Diagnosis not present

## 2021-03-12 DIAGNOSIS — K219 Gastro-esophageal reflux disease without esophagitis: Secondary | ICD-10-CM | POA: Diagnosis not present

## 2021-03-12 DIAGNOSIS — Z8616 Personal history of COVID-19: Secondary | ICD-10-CM | POA: Diagnosis not present

## 2021-03-12 DIAGNOSIS — R0602 Shortness of breath: Secondary | ICD-10-CM

## 2021-03-12 DIAGNOSIS — Z515 Encounter for palliative care: Secondary | ICD-10-CM | POA: Diagnosis not present

## 2021-03-12 DIAGNOSIS — J441 Chronic obstructive pulmonary disease with (acute) exacerbation: Secondary | ICD-10-CM | POA: Diagnosis not present

## 2021-03-12 DIAGNOSIS — F32A Depression, unspecified: Secondary | ICD-10-CM | POA: Diagnosis not present

## 2021-03-12 DIAGNOSIS — F0394 Unspecified dementia, unspecified severity, with anxiety: Secondary | ICD-10-CM | POA: Diagnosis not present

## 2021-03-12 DIAGNOSIS — D696 Thrombocytopenia, unspecified: Secondary | ICD-10-CM | POA: Diagnosis not present

## 2021-03-12 DIAGNOSIS — E11649 Type 2 diabetes mellitus with hypoglycemia without coma: Secondary | ICD-10-CM | POA: Diagnosis not present

## 2021-03-12 DIAGNOSIS — E785 Hyperlipidemia, unspecified: Secondary | ICD-10-CM | POA: Diagnosis not present

## 2021-03-12 DIAGNOSIS — N209 Urinary calculus, unspecified: Secondary | ICD-10-CM | POA: Diagnosis not present

## 2021-03-12 DIAGNOSIS — M1711 Unilateral primary osteoarthritis, right knee: Secondary | ICD-10-CM | POA: Diagnosis not present

## 2021-03-12 DIAGNOSIS — F0390 Unspecified dementia without behavioral disturbance: Secondary | ICD-10-CM

## 2021-03-12 DIAGNOSIS — Z7982 Long term (current) use of aspirin: Secondary | ICD-10-CM | POA: Diagnosis not present

## 2021-03-12 DIAGNOSIS — M5030 Other cervical disc degeneration, unspecified cervical region: Secondary | ICD-10-CM | POA: Diagnosis not present

## 2021-03-12 DIAGNOSIS — H8110 Benign paroxysmal vertigo, unspecified ear: Secondary | ICD-10-CM | POA: Diagnosis not present

## 2021-03-12 DIAGNOSIS — I129 Hypertensive chronic kidney disease with stage 1 through stage 4 chronic kidney disease, or unspecified chronic kidney disease: Secondary | ICD-10-CM | POA: Diagnosis not present

## 2021-03-12 DIAGNOSIS — N189 Chronic kidney disease, unspecified: Secondary | ICD-10-CM | POA: Diagnosis not present

## 2021-03-12 DIAGNOSIS — G9341 Metabolic encephalopathy: Secondary | ICD-10-CM | POA: Diagnosis not present

## 2021-03-12 LAB — POCT I-STAT CREATININE: Creatinine, Ser: 1 mg/dL (ref 0.61–1.24)

## 2021-03-12 MED ORDER — IOHEXOL 350 MG/ML SOLN
75.0000 mL | Freq: Once | INTRAVENOUS | Status: AC | PRN
  Administered 2021-03-12: 09:00:00 75 mL via INTRAVENOUS

## 2021-03-15 NOTE — Progress Notes (Signed)
[facility] Palliative Care Consult Note Telephone: [phone]  Fax: [phone]    Date of encounter: 03/12/2021  PATIENT NAME: Todd Davidson [address] [place] [address]   [phone] (home)  DOB: 12-Nov-1935 MRN: [id number] PRIMARY CARE PROVIDER:    Perrin Maltese, MD,  [address] [place] [address] [phone]  REFERRING PROVIDER:   Perrin Maltese, MD [place],  [place] [address] [phone]  RESPONSIBLE PARTY:    Contact Information     Name Relation Home Work [place] Brother [phone]  [phone]        I met face to face with patient in the facility. Palliative Care was asked to follow this patient by consultation request of  Perrin Maltese, MD to address advance care planning and complex medical decision making. This is a follow up visit.                                   ASSESSMENT AND PLAN / RECOMMENDATIONS:   Advance Care Planning/Goals of Care: Goals include to maximize quality of life and symptom management. Our advance care planning conversation included a discussion about:    The value and importance of advance care planning  Experiences with loved ones who have been seriously ill or have died  Exploration of personal, cultural or spiritual beliefs that might influence medical decisions  CODE STATUS: DNR  Symptom Management/Plan:  Dementia-patient has moved to [facility]. Facility is in process of moving him to dementia unit; they are awaiting FL2 from his PCP. Staff to continue monitoring for falls/safety. Assist with adl's as needed. Use walker for ambulation. Monitor for further cognitive/functional declines.   Dyspnea-secondary to asthma, COPD. Shortness of breath currently managed. Continue inhaler, nebulizer as directed.    Follow up Palliative Care Visit: Palliative care will continue to follow for complex medical decision making, advance  care planning, and clarification of goals. Return in 8 weeks or prn.  I spent 25 minutes providing this consultation. More than 50% of the time in this consultation was spent in counseling and care coordination.   PPS: 40%  HOSPICE ELIGIBILITY/DIAGNOSIS: TBD  Chief Complaint: Palliative Medicine follow up visit.   HISTORY OF PRESENT ILLNESS:  Todd Davidson is a 85 y.o. year old male  with dementia, CAD, left renal mass, prostate cancer, asthma, COPD, hypertension. Patient hospitalized 10/13-10/31/22 due to acute metabolic encephalopathy, acute respiratory failure, aspiration pneumonia, COPD exacerbation.   Patient currently resides at [facility]. He transitioned from SNF after hospitalization in October. Staff report patient being stable. They are in process of having him transitioned to dementia unit; awaiting FL2 from his PCP. Patient requires some assistance with adl's. Propels self about facility; staff does express some concern for safety. He also ambulates short distances with walker. He is forgetful and staff provide frequent reminders/cueing. No falls reported. Appetite has been good. He denies pain. His shortness of breath has been managed; does endorse some shortness of breath with exertion. He is sleeping well. Patient went out for a CT scan this morning to f/u on hx of renal mass. A 10-point review of systems is negative, except for the pertinent positives and negatives detailed in the HPI.  History obtained from review of EMR, discussion with primary team, and interview with family, facility staff/caregiver and/or Todd Davidson.  I reviewed available  labs, medications, imaging, studies and related documents from the EMR.  Records reviewed and summarized above.    Physical Exam: Pulse 62, resp 16, b/p 120/72, sats 93% on room air Constitutional: NAD General: frail appearing EYES: anicteric sclera, lids intact, no discharge  ENMT: intact hearing, oral mucous  membranes moist, dentition intact CV: S1S2, RRR, no LE edema Pulmonary: LCTA except left base slightly diminished, no increased work of breathing, no cough, room air Abdomen: normo-active BS + 4 quadrants, soft and non tender, no ascites GU: deferred MSK:  ambulatory Skin: warm and dry, no rashes or wounds on visible skin Neuro:  no generalized weakness, A & O x 2 Psych: non-anxious affect, pleasant Hem/lymph/immuno: no widespread bruising   Thank you for the opportunity to participate in the care of Todd Davidson.  The palliative care team will continue to follow. Please call our office at [phone] if we can be of additional assistance.   Ezekiel Slocumb, NP   COVID-19 PATIENT SCREENING TOOL Asked and negative response unless otherwise noted:   Have you had symptoms of covid, tested positive or been in contact with someone with symptoms/positive test in the past 5-10 days? No

## 2021-03-16 DIAGNOSIS — M1711 Unilateral primary osteoarthritis, right knee: Secondary | ICD-10-CM | POA: Diagnosis not present

## 2021-03-16 DIAGNOSIS — J441 Chronic obstructive pulmonary disease with (acute) exacerbation: Secondary | ICD-10-CM | POA: Diagnosis not present

## 2021-03-16 DIAGNOSIS — F32A Depression, unspecified: Secondary | ICD-10-CM | POA: Diagnosis not present

## 2021-03-16 DIAGNOSIS — Z87891 Personal history of nicotine dependence: Secondary | ICD-10-CM | POA: Diagnosis not present

## 2021-03-16 DIAGNOSIS — E11649 Type 2 diabetes mellitus with hypoglycemia without coma: Secondary | ICD-10-CM | POA: Diagnosis not present

## 2021-03-16 DIAGNOSIS — G9341 Metabolic encephalopathy: Secondary | ICD-10-CM | POA: Diagnosis not present

## 2021-03-16 DIAGNOSIS — N189 Chronic kidney disease, unspecified: Secondary | ICD-10-CM | POA: Diagnosis not present

## 2021-03-16 DIAGNOSIS — M5030 Other cervical disc degeneration, unspecified cervical region: Secondary | ICD-10-CM | POA: Diagnosis not present

## 2021-03-16 DIAGNOSIS — F0393 Unspecified dementia, unspecified severity, with mood disturbance: Secondary | ICD-10-CM | POA: Diagnosis not present

## 2021-03-16 DIAGNOSIS — I2510 Atherosclerotic heart disease of native coronary artery without angina pectoris: Secondary | ICD-10-CM | POA: Diagnosis not present

## 2021-03-16 DIAGNOSIS — E785 Hyperlipidemia, unspecified: Secondary | ICD-10-CM | POA: Diagnosis not present

## 2021-03-16 DIAGNOSIS — K219 Gastro-esophageal reflux disease without esophagitis: Secondary | ICD-10-CM | POA: Diagnosis not present

## 2021-03-16 DIAGNOSIS — F03911 Unspecified dementia, unspecified severity, with agitation: Secondary | ICD-10-CM | POA: Diagnosis not present

## 2021-03-16 DIAGNOSIS — G2581 Restless legs syndrome: Secondary | ICD-10-CM | POA: Diagnosis not present

## 2021-03-16 DIAGNOSIS — H8110 Benign paroxysmal vertigo, unspecified ear: Secondary | ICD-10-CM | POA: Diagnosis not present

## 2021-03-16 DIAGNOSIS — E1122 Type 2 diabetes mellitus with diabetic chronic kidney disease: Secondary | ICD-10-CM | POA: Diagnosis not present

## 2021-03-16 DIAGNOSIS — F0394 Unspecified dementia, unspecified severity, with anxiety: Secondary | ICD-10-CM | POA: Diagnosis not present

## 2021-03-16 DIAGNOSIS — I129 Hypertensive chronic kidney disease with stage 1 through stage 4 chronic kidney disease, or unspecified chronic kidney disease: Secondary | ICD-10-CM | POA: Diagnosis not present

## 2021-03-16 DIAGNOSIS — Z7982 Long term (current) use of aspirin: Secondary | ICD-10-CM | POA: Diagnosis not present

## 2021-03-16 DIAGNOSIS — G4739 Other sleep apnea: Secondary | ICD-10-CM | POA: Diagnosis not present

## 2021-03-16 DIAGNOSIS — Z8616 Personal history of COVID-19: Secondary | ICD-10-CM | POA: Diagnosis not present

## 2021-03-16 DIAGNOSIS — D696 Thrombocytopenia, unspecified: Secondary | ICD-10-CM | POA: Diagnosis not present

## 2021-03-17 ENCOUNTER — Other Ambulatory Visit: Payer: Self-pay | Admitting: *Deleted

## 2021-03-18 ENCOUNTER — Ambulatory Visit: Payer: Medicare Other | Admitting: Radiation Oncology

## 2021-03-18 ENCOUNTER — Inpatient Hospital Stay: Payer: Medicare Other

## 2021-03-18 DIAGNOSIS — Z8616 Personal history of COVID-19: Secondary | ICD-10-CM | POA: Diagnosis not present

## 2021-03-18 DIAGNOSIS — G9341 Metabolic encephalopathy: Secondary | ICD-10-CM | POA: Diagnosis not present

## 2021-03-18 DIAGNOSIS — K219 Gastro-esophageal reflux disease without esophagitis: Secondary | ICD-10-CM | POA: Diagnosis not present

## 2021-03-18 DIAGNOSIS — N189 Chronic kidney disease, unspecified: Secondary | ICD-10-CM | POA: Diagnosis not present

## 2021-03-18 DIAGNOSIS — E1122 Type 2 diabetes mellitus with diabetic chronic kidney disease: Secondary | ICD-10-CM | POA: Diagnosis not present

## 2021-03-18 DIAGNOSIS — I129 Hypertensive chronic kidney disease with stage 1 through stage 4 chronic kidney disease, or unspecified chronic kidney disease: Secondary | ICD-10-CM | POA: Diagnosis not present

## 2021-03-18 DIAGNOSIS — J441 Chronic obstructive pulmonary disease with (acute) exacerbation: Secondary | ICD-10-CM | POA: Diagnosis not present

## 2021-03-18 DIAGNOSIS — M5030 Other cervical disc degeneration, unspecified cervical region: Secondary | ICD-10-CM | POA: Diagnosis not present

## 2021-03-18 DIAGNOSIS — G2581 Restless legs syndrome: Secondary | ICD-10-CM | POA: Diagnosis not present

## 2021-03-18 DIAGNOSIS — F0393 Unspecified dementia, unspecified severity, with mood disturbance: Secondary | ICD-10-CM | POA: Diagnosis not present

## 2021-03-18 DIAGNOSIS — F0394 Unspecified dementia, unspecified severity, with anxiety: Secondary | ICD-10-CM | POA: Diagnosis not present

## 2021-03-18 DIAGNOSIS — I2510 Atherosclerotic heart disease of native coronary artery without angina pectoris: Secondary | ICD-10-CM | POA: Diagnosis not present

## 2021-03-18 DIAGNOSIS — E11649 Type 2 diabetes mellitus with hypoglycemia without coma: Secondary | ICD-10-CM | POA: Diagnosis not present

## 2021-03-18 DIAGNOSIS — Z7982 Long term (current) use of aspirin: Secondary | ICD-10-CM | POA: Diagnosis not present

## 2021-03-18 DIAGNOSIS — H8110 Benign paroxysmal vertigo, unspecified ear: Secondary | ICD-10-CM | POA: Diagnosis not present

## 2021-03-18 DIAGNOSIS — E785 Hyperlipidemia, unspecified: Secondary | ICD-10-CM | POA: Diagnosis not present

## 2021-03-18 DIAGNOSIS — G4739 Other sleep apnea: Secondary | ICD-10-CM | POA: Diagnosis not present

## 2021-03-18 DIAGNOSIS — Z87891 Personal history of nicotine dependence: Secondary | ICD-10-CM | POA: Diagnosis not present

## 2021-03-18 DIAGNOSIS — F03911 Unspecified dementia, unspecified severity, with agitation: Secondary | ICD-10-CM | POA: Diagnosis not present

## 2021-03-18 DIAGNOSIS — M1711 Unilateral primary osteoarthritis, right knee: Secondary | ICD-10-CM | POA: Diagnosis not present

## 2021-03-18 DIAGNOSIS — F32A Depression, unspecified: Secondary | ICD-10-CM | POA: Diagnosis not present

## 2021-03-18 DIAGNOSIS — D696 Thrombocytopenia, unspecified: Secondary | ICD-10-CM | POA: Diagnosis not present

## 2021-03-19 DIAGNOSIS — F03911 Unspecified dementia, unspecified severity, with agitation: Secondary | ICD-10-CM | POA: Diagnosis not present

## 2021-03-19 DIAGNOSIS — G4739 Other sleep apnea: Secondary | ICD-10-CM | POA: Diagnosis not present

## 2021-03-19 DIAGNOSIS — G2581 Restless legs syndrome: Secondary | ICD-10-CM | POA: Diagnosis not present

## 2021-03-19 DIAGNOSIS — E785 Hyperlipidemia, unspecified: Secondary | ICD-10-CM | POA: Diagnosis not present

## 2021-03-19 DIAGNOSIS — J441 Chronic obstructive pulmonary disease with (acute) exacerbation: Secondary | ICD-10-CM | POA: Diagnosis not present

## 2021-03-19 DIAGNOSIS — F0394 Unspecified dementia, unspecified severity, with anxiety: Secondary | ICD-10-CM | POA: Diagnosis not present

## 2021-03-19 DIAGNOSIS — I2510 Atherosclerotic heart disease of native coronary artery without angina pectoris: Secondary | ICD-10-CM | POA: Diagnosis not present

## 2021-03-19 DIAGNOSIS — Z87891 Personal history of nicotine dependence: Secondary | ICD-10-CM | POA: Diagnosis not present

## 2021-03-19 DIAGNOSIS — Z8616 Personal history of COVID-19: Secondary | ICD-10-CM | POA: Diagnosis not present

## 2021-03-19 DIAGNOSIS — I129 Hypertensive chronic kidney disease with stage 1 through stage 4 chronic kidney disease, or unspecified chronic kidney disease: Secondary | ICD-10-CM | POA: Diagnosis not present

## 2021-03-19 DIAGNOSIS — D696 Thrombocytopenia, unspecified: Secondary | ICD-10-CM | POA: Diagnosis not present

## 2021-03-19 DIAGNOSIS — M5030 Other cervical disc degeneration, unspecified cervical region: Secondary | ICD-10-CM | POA: Diagnosis not present

## 2021-03-19 DIAGNOSIS — M1711 Unilateral primary osteoarthritis, right knee: Secondary | ICD-10-CM | POA: Diagnosis not present

## 2021-03-19 DIAGNOSIS — H8110 Benign paroxysmal vertigo, unspecified ear: Secondary | ICD-10-CM | POA: Diagnosis not present

## 2021-03-19 DIAGNOSIS — Z7982 Long term (current) use of aspirin: Secondary | ICD-10-CM | POA: Diagnosis not present

## 2021-03-19 DIAGNOSIS — G9341 Metabolic encephalopathy: Secondary | ICD-10-CM | POA: Diagnosis not present

## 2021-03-19 DIAGNOSIS — F0393 Unspecified dementia, unspecified severity, with mood disturbance: Secondary | ICD-10-CM | POA: Diagnosis not present

## 2021-03-19 DIAGNOSIS — F32A Depression, unspecified: Secondary | ICD-10-CM | POA: Diagnosis not present

## 2021-03-19 DIAGNOSIS — E11649 Type 2 diabetes mellitus with hypoglycemia without coma: Secondary | ICD-10-CM | POA: Diagnosis not present

## 2021-03-19 DIAGNOSIS — K219 Gastro-esophageal reflux disease without esophagitis: Secondary | ICD-10-CM | POA: Diagnosis not present

## 2021-03-19 DIAGNOSIS — E1122 Type 2 diabetes mellitus with diabetic chronic kidney disease: Secondary | ICD-10-CM | POA: Diagnosis not present

## 2021-03-19 DIAGNOSIS — N189 Chronic kidney disease, unspecified: Secondary | ICD-10-CM | POA: Diagnosis not present

## 2021-03-23 ENCOUNTER — Other Ambulatory Visit: Payer: Self-pay

## 2021-03-23 ENCOUNTER — Ambulatory Visit
Admission: RE | Admit: 2021-03-23 | Discharge: 2021-03-23 | Disposition: A | Discharge status: Home / Self Care | Payer: Medicare Other | Source: Ambulatory Visit | Attending: Interventional Radiology | Admitting: Interventional Radiology

## 2021-03-23 ENCOUNTER — Encounter: Payer: Self-pay | Admitting: *Deleted

## 2021-03-23 DIAGNOSIS — Z7982 Long term (current) use of aspirin: Secondary | ICD-10-CM | POA: Diagnosis not present

## 2021-03-23 DIAGNOSIS — H8110 Benign paroxysmal vertigo, unspecified ear: Secondary | ICD-10-CM | POA: Diagnosis not present

## 2021-03-23 DIAGNOSIS — K219 Gastro-esophageal reflux disease without esophagitis: Secondary | ICD-10-CM | POA: Diagnosis not present

## 2021-03-23 DIAGNOSIS — G4739 Other sleep apnea: Secondary | ICD-10-CM | POA: Diagnosis not present

## 2021-03-23 DIAGNOSIS — E11649 Type 2 diabetes mellitus with hypoglycemia without coma: Secondary | ICD-10-CM | POA: Diagnosis not present

## 2021-03-23 DIAGNOSIS — M1711 Unilateral primary osteoarthritis, right knee: Secondary | ICD-10-CM | POA: Diagnosis not present

## 2021-03-23 DIAGNOSIS — F03911 Unspecified dementia, unspecified severity, with agitation: Secondary | ICD-10-CM | POA: Diagnosis not present

## 2021-03-23 DIAGNOSIS — D696 Thrombocytopenia, unspecified: Secondary | ICD-10-CM | POA: Diagnosis not present

## 2021-03-23 DIAGNOSIS — F32A Depression, unspecified: Secondary | ICD-10-CM | POA: Diagnosis not present

## 2021-03-23 DIAGNOSIS — C642 Malignant neoplasm of left kidney, except renal pelvis: Secondary | ICD-10-CM | POA: Diagnosis not present

## 2021-03-23 DIAGNOSIS — N189 Chronic kidney disease, unspecified: Secondary | ICD-10-CM | POA: Diagnosis not present

## 2021-03-23 DIAGNOSIS — Z9889 Other specified postprocedural states: Secondary | ICD-10-CM | POA: Diagnosis not present

## 2021-03-23 DIAGNOSIS — J441 Chronic obstructive pulmonary disease with (acute) exacerbation: Secondary | ICD-10-CM | POA: Diagnosis not present

## 2021-03-23 DIAGNOSIS — Z8616 Personal history of COVID-19: Secondary | ICD-10-CM | POA: Diagnosis not present

## 2021-03-23 DIAGNOSIS — I2510 Atherosclerotic heart disease of native coronary artery without angina pectoris: Secondary | ICD-10-CM | POA: Diagnosis not present

## 2021-03-23 DIAGNOSIS — M5030 Other cervical disc degeneration, unspecified cervical region: Secondary | ICD-10-CM | POA: Diagnosis not present

## 2021-03-23 DIAGNOSIS — E785 Hyperlipidemia, unspecified: Secondary | ICD-10-CM | POA: Diagnosis not present

## 2021-03-23 DIAGNOSIS — I129 Hypertensive chronic kidney disease with stage 1 through stage 4 chronic kidney disease, or unspecified chronic kidney disease: Secondary | ICD-10-CM | POA: Diagnosis not present

## 2021-03-23 DIAGNOSIS — Z87891 Personal history of nicotine dependence: Secondary | ICD-10-CM | POA: Diagnosis not present

## 2021-03-23 DIAGNOSIS — F0393 Unspecified dementia, unspecified severity, with mood disturbance: Secondary | ICD-10-CM | POA: Diagnosis not present

## 2021-03-23 DIAGNOSIS — E1122 Type 2 diabetes mellitus with diabetic chronic kidney disease: Secondary | ICD-10-CM | POA: Diagnosis not present

## 2021-03-23 DIAGNOSIS — G2581 Restless legs syndrome: Secondary | ICD-10-CM | POA: Diagnosis not present

## 2021-03-23 DIAGNOSIS — G9341 Metabolic encephalopathy: Secondary | ICD-10-CM | POA: Diagnosis not present

## 2021-03-23 DIAGNOSIS — F0394 Unspecified dementia, unspecified severity, with anxiety: Secondary | ICD-10-CM | POA: Diagnosis not present

## 2021-03-23 HISTORY — PX: IR RADIOLOGIST EVAL & MGMT: IMG5224

## 2021-03-23 NOTE — Progress Notes (Signed)
Patient ID: Todd Davidson, male   DOB: [birth year], 86 y.o.   MRN: [id number]       Chief Complaint:  Left renal cell carcinoma status post previous ablation  Referring Physician(s): Dr. Erlene Quan, [facility] urology  History of Present Illness: Todd Davidson is a 86 y.o. male who is now nearly 1 year 14-month status post left upper pole renal cell carcinoma biopsy and cryoablation performed at [facility].  Biopsy confirmed clear-cell renal cell carcinoma.  He has multiple comorbidities including prostate cancer, progressive Alzheimer's dementia, COPD and CHF.  He also has a fused right knee but ambulates with a walker.  Since our last talking, he has been placed in a nursing home/memory care unit.  His brother is his power of attorney who I spoke with today by telephone.  Surveillance imaging performed 03/12/2021 was reviewed with his brother by telephone.  Again, this demonstrates continued interval growth and a deep margin enhancing nodule at the ablation site now measuring 12 mm craniocaudal, previously 9 mm.  This is consistent with residual or recurrent tumor along the medial ablation defects.  No adenopathy.  Stable renal cyst.  No other acute renal process.  Past Medical History:  Diagnosis Date   Arthritis    neck, shoulders, hips, knees   Asthma    as child, out grew   Barrett esophagus    BPH (benign prostatic hyperplasia)    Coronary artery disease    annual stress with Dr. Humphrey Rolls. no new findings   Coronary artery disease    Dementia ([place])    Depression    Diabetes mellitus without complication ([place])    no medications currently was on insulin but was dropping blood sugars too low causing syncope   Diverticulosis    Duodenitis    Gastritis    GERD (gastroesophageal reflux disease)    History of COVID-19 02/2019   History of kidney stones    Hypercholesteremia    Hypertension    Left renal mass    Leg fracture, right    wears brace   Neuromuscular disorder (HCC)     neuropathy - feet   Occasional tremors    hands   Prostate cancer ([place])    Renal insufficiency    Sleep apnea    doesn't wear CPAP, pt denies   Wears dentures    full upper    Past Surgical History:  Procedure Laterality Date   [place], 2000   stents placed   CATARACT EXTRACTION W/PHACO Left 10/29/2014   Procedure: CATARACT EXTRACTION PHACO AND INTRAOCULAR LENS PLACEMENT ([place]);  Surgeon: Leandrew Koyanagi, MD;  Location: [place];  Service: Ophthalmology;  Laterality: Left;  DIABETIC - insulin MALYUGIN   CERVICAL FUSION     CORONARY ANGIOPLASTY     ESOPHAGOGASTRODUODENOSCOPY N/A 12/29/2017   Procedure: ESOPHAGOGASTRODUODENOSCOPY (EGD);  Surgeon: [name], MD;  Location: [facility] ENDOSCOPY;  Service: Endoscopy;  Laterality: N/A;   ESOPHAGOGASTRODUODENOSCOPY (EGD) WITH PROPOFOL N/A 12/17/2015   Procedure: ESOPHAGOGASTRODUODENOSCOPY (EGD) WITH PROPOFOL;  Surgeon: [name], MD;  Location: [facility] ENDOSCOPY;  Service: Endoscopy;  Laterality: N/A;   EYE SURGERY Right    HERNIA REPAIR     x2   I & D EXTREMITY Right 10/31/2018   Procedure: IRRIGATION AND DEBRIDEMENT RIGHT KNEE  EXTERNAL FIXATION OF FRACTURE;  Surgeon: Shona Needles, MD;  Location: [place];  Service: Orthopedics;  Laterality: Right;  IRRIGATION AND DEBRIDEMENT RIGHT KNEE  EXTERNAL  FIXATION OF FRACTURE   IR RADIOLOGIST EVAL & MGMT  02/28/2019   IR RADIOLOGIST EVAL & MGMT  04/30/2019   IR RADIOLOGIST EVAL & MGMT  07/02/2019   IR RADIOLOGIST EVAL & MGMT  10/10/2019   IR RADIOLOGIST EVAL & MGMT  06/25/2020   IR RADIOLOGIST EVAL & MGMT  10/14/2020   IRRIGATION AND DEBRIDEMENT KNEE Right 10/31/2018   JOINT REPLACEMENT     knee   KNEE FUSION     1973    KNEE SURGERY Right    joint removal with fusion and graft   NISSEN FUNDOPLICATION     RADIOFREQUENCY ABLATION Left 06/05/2019   Procedure: LEFT RENAL  CRYO ABLATION;  Surgeon: Greggory Keen, MD;  Location: [facility];   Service: Anesthesiology;  Laterality: Left;    Allergies: Macrolides and ketolides, Erythromycin, Propranolol, Sulfa antibiotics, Sulfa antibiotics, Sulfasalazine, Tape, Tapentadol, Tapentadol, Telbivudine, and Tape  Medications: Prior to Admission medications   Medication Sig Start Date End Date Taking? Authorizing Provider  acetaminophen (TYLENOL) 325 MG tablet Take 2 tablets (650 mg total) by mouth every 6 (six) hours as needed for mild pain. 11/05/18   Norm Parcel, PA-C  amLODipine (NORVASC) 10 MG tablet Take 10 mg by mouth daily. 09/04/18   [provider]  aspirin 81 MG chewable tablet Chew 81 mg by mouth daily. AM    [provider]  benazepril (LOTENSIN) 40 MG tablet Take 40 mg by mouth at bedtime.  09/04/18   [provider]  citalopram (CELEXA) 10 MG tablet Take 10 mg by mouth at bedtime.  10/30/18   [provider]  clotrimazole (LOTRIMIN) 1 % cream Apply 1 application topically 2 (two) times daily. 08/01/19   Arta Silence, MD  Fluticasone-Umeclidin-Vilant (TRELEGY ELLIPTA) 100-62.5-25 MCG/INH AEPB Inhale 1 puff into the lungs daily. 07/28/20   Lavera Guise, MD  gabapentin (NEURONTIN) 300 MG capsule Take 300 mg by mouth 2 (two) times daily.  09/27/18   [provider]  guaiFENesin (MUCINEX) 600 MG 12 hr tablet Take 600 mg by mouth 2 (two) times daily.    [provider]  hydrocortisone (ANUSOL-HC) 2.5 % rectal cream Apply 1 application topically daily as needed. 01/31/20   [provider]  ipratropium-albuterol (DUONEB) 0.5-2.5 (3) MG/3ML SOLN Take 3 mLs by nebulization.    [provider]  Nutritional Supplements (FEEDING SUPPLEMENT, NEPRO CARB STEADY,) LIQD Take 237 mLs by mouth 3 (three) times daily between meals. 01/17/21   Max Sane, MD  omeprazole (PRILOSEC) 40 MG capsule Take 40 mg by mouth daily.     [provider]  QUEtiapine (SEROQUEL) 25 MG tablet Take 1 tablet (25 mg total) by mouth  at bedtime. 01/07/21   Sharen Hones, MD     Family History  Problem Relation Age of Onset   Prostate cancer Father    Heart disease Brother    Stroke Mother    Breast cancer Sister     Social History   Socioeconomic History   Marital status: Widowed    Spouse name: Not on file   Number of children: Not on file   Years of education: Not on file   Highest education level: Not on file  Occupational History   Not on file  Tobacco Use   Smoking status: Former    Packs/day: 1.00    Years: 20.00    Pack years: 20.00    Types: Cigarettes, Pipe, Cigars    Quit date: 03/21/1973    Years  since quitting: 48.0   Smokeless tobacco: Current    Types: Snuff   Tobacco comments:    SMOKELESS TOBACCO  Vaping Use   Vaping Use: Never used  Substance and Sexual Activity   Alcohol use: Yes    Alcohol/week: 25.0 standard drinks    Types: 25 Cans of beer per week    Comment: 4-6 a day   Drug use: Never   Sexual activity: Not Currently  Other Topics Concern   Not on file  Social History Narrative   ** Merged History Encounter **       Social Determinants of Health   Financial Resource Strain: Not on file  Food Insecurity: Not on file  Transportation Needs: Not on file  Physical Activity: Not on file  Stress: Not on file  Social Connections: Not on file     Review of Systems  Review of Systems: A 12 point ROS discussed and pertinent positives are indicated in the HPI above.  All other systems are negative.  Physical Exam No direct physical exam was performed telephone health visit only today Vital Signs: There were no vitals taken for this visit.  Imaging: CT ABDOMEN W WO CONTRAST  Result Date: 03/12/2021 CLINICAL DATA:  Left renal cell carcinoma post ablation EXAM: CT ABDOMEN WITHOUT AND WITH CONTRAST TECHNIQUE: Multidetector CT imaging of the abdomen was performed following the standard protocol before and following the bolus administration of intravenous contrast.  CONTRAST:  45mL OMNIPAQUE IOHEXOL 350 MG/ML SOLN COMPARISON:  10/13/2020 and previous FINDINGS: Lower chest: Pulmonary emphysema. No pleural or pericardial effusion. Hepatobiliary: No focal liver abnormality is seen. No gallstones, gallbladder wall thickening, or biliary dilatation. Pancreas: Unremarkable. No pancreatic ductal dilatation or surrounding inflammatory changes. Spleen: Normal in size without focal abnormality. Adrenals/Urinary Tract: Adrenals unremarkable. Stable bilateral renal cysts. 2.4 x 2.1 cm ablation defect medially in the left upper pole (previously 2.2 x 2.2), with a nodular focus of enhancement along its lateral aspect measuring 1.2 cm in maximum craniocaudal dimension (7:63), previously 0.8 cm. No new lesion. Bilateral nonobstructive urolithiasis. Stomach/Bowel: Stomach is incompletely distended, unremarkable. Visualized portions of small bowel and colon are nondilated, unremarkable. Vascular/Lymphatic: Moderate calcified aortic atheromatous plaque without aneurysm or stenosis. Proximal visceral, renal, and iliac vessels patent. No retroperitoneal or mesenteric adenopathy. Other: No abdominal ascites.  No free air. Musculoskeletal: Stable L1 and L4 compression deformities. Spondylitic changes in the thoracolumbar spine. No acute fracture or worrisome bone lesion. IMPRESSION: 1. Stable overall size of medial left upper pole renal ablation zone, with slight enlargement of 1.2 cm enhancing focus along lateral margin (previously 0.8). 2. No regional adenopathy. 3. Aortic Atherosclerosis (ICD10-I70.0) and Emphysema (ICD10-J43.9). Electronically Signed   By: Lucrezia Europe M.D.   On: 03/12/2021 14:31    Labs:  CBC: Recent Labs    01/01/21 0723 01/02/21 0451 01/03/21 0528 01/07/21 0515  WBC 10.7* 10.3 8.8 5.8  HGB 12.5* 14.5 14.5 15.7  HCT 36.4* 41.0 42.4 45.6  PLT 109* 129* 126* 125*    COAGS: No results for input(s): INR, APTT in the last 8760 hours.  BMP: Recent Labs     01/01/21 0723 01/02/21 0451 01/03/21 0528 01/04/21 0743 01/07/21 0515 01/14/21 0658 03/12/21 0911  NA 138 142 142 144  --   --   --   K 3.3* 3.5 3.2* 3.6  --   --   --   CL 104 104 103 105  --   --   --   CO2 26 28  28 23  --   --   --   GLUCOSE 169* 120* 138* 145*  --   --   --   BUN 10 13 14 14   --   --   --   CALCIUM 8.5* 9.0 9.0 9.1  --   --   --   CREATININE 0.89 0.93 0.87 0.94 1.03 0.70 1.00  GFRNONAA >60 >60 >60 >60 >60 >60  --     LIVER FUNCTION TESTS: Recent Labs    12/31/20 0903  BILITOT 0.8  AST 33  ALT 16  ALKPHOS 74  PROT 7.5  ALBUMIN 4.0     Assessment and Plan:  1 year 28-month status post left renal cell carcinoma cryoablation.  Surveillance imaging shows continued enlargement of a medial deep margin enhancing nodule now 11 mm, previously 9 mm and 5 mm.  This is consistent with margin recurrence or residual tumor by CT.  Today's visit is by telehealth only with his brother who is the power of attorney.  Patient is now in a nursing home/memory care unit with progressive dementia.  11 mm medial deep margin enhancing nodule remains amenable to repeat CT-guided cryoablation.  Imaging findings discussed with the patient's brother today.  Plan: Patient's brother, [name] would like to discuss with his primary care physician whether or not proceeding with any further imaging surveillance or treatment at this time given his progressive dementia.  [name] will be in contact with our office regarding his decision.  Electronically Signed: Greggory Keen 03/23/2021, 12:03 PM   I spent a total of    25 Minutes in remote  clinical consultation, greater than 50% of which was counseling/coordinating care for this patient with renal cell carcinoma.    Visit type: Audio only (telephone). Audio (no video) only due to patient's lack of internet/smartphone capability. Alternative for in-person consultation at [facility], [address], [place], [place]. This visit  type was conducted due to national recommendations for restrictions regarding the COVID-19 Pandemic (e.g. social distancing).  This format is felt to be most appropriate for this patient at this time.  All issues noted in this document were discussed and addressed.

## 2021-03-24 ENCOUNTER — Ambulatory Visit (INDEPENDENT_AMBULATORY_CARE_PROVIDER_SITE_OTHER): Payer: Medicare Other | Admitting: Urology

## 2021-03-24 ENCOUNTER — Other Ambulatory Visit: Payer: Self-pay

## 2021-03-24 ENCOUNTER — Encounter: Payer: Self-pay | Admitting: Urology

## 2021-03-24 VITALS — BP 113/65 | HR 72 | Ht 72.0 in | Wt 140.0 lb

## 2021-03-24 DIAGNOSIS — Z8616 Personal history of COVID-19: Secondary | ICD-10-CM | POA: Diagnosis not present

## 2021-03-24 DIAGNOSIS — I129 Hypertensive chronic kidney disease with stage 1 through stage 4 chronic kidney disease, or unspecified chronic kidney disease: Secondary | ICD-10-CM | POA: Diagnosis not present

## 2021-03-24 DIAGNOSIS — H8110 Benign paroxysmal vertigo, unspecified ear: Secondary | ICD-10-CM | POA: Diagnosis not present

## 2021-03-24 DIAGNOSIS — J441 Chronic obstructive pulmonary disease with (acute) exacerbation: Secondary | ICD-10-CM | POA: Diagnosis not present

## 2021-03-24 DIAGNOSIS — F0393 Unspecified dementia, unspecified severity, with mood disturbance: Secondary | ICD-10-CM | POA: Diagnosis not present

## 2021-03-24 DIAGNOSIS — K219 Gastro-esophageal reflux disease without esophagitis: Secondary | ICD-10-CM | POA: Diagnosis not present

## 2021-03-24 DIAGNOSIS — Z87891 Personal history of nicotine dependence: Secondary | ICD-10-CM | POA: Diagnosis not present

## 2021-03-24 DIAGNOSIS — F03911 Unspecified dementia, unspecified severity, with agitation: Secondary | ICD-10-CM | POA: Diagnosis not present

## 2021-03-24 DIAGNOSIS — E785 Hyperlipidemia, unspecified: Secondary | ICD-10-CM | POA: Diagnosis not present

## 2021-03-24 DIAGNOSIS — C649 Malignant neoplasm of unspecified kidney, except renal pelvis: Secondary | ICD-10-CM | POA: Diagnosis not present

## 2021-03-24 DIAGNOSIS — I2510 Atherosclerotic heart disease of native coronary artery without angina pectoris: Secondary | ICD-10-CM | POA: Diagnosis not present

## 2021-03-24 DIAGNOSIS — M5030 Other cervical disc degeneration, unspecified cervical region: Secondary | ICD-10-CM | POA: Diagnosis not present

## 2021-03-24 DIAGNOSIS — G9341 Metabolic encephalopathy: Secondary | ICD-10-CM | POA: Diagnosis not present

## 2021-03-24 DIAGNOSIS — F32A Depression, unspecified: Secondary | ICD-10-CM | POA: Diagnosis not present

## 2021-03-24 DIAGNOSIS — D696 Thrombocytopenia, unspecified: Secondary | ICD-10-CM | POA: Diagnosis not present

## 2021-03-24 DIAGNOSIS — Z7982 Long term (current) use of aspirin: Secondary | ICD-10-CM | POA: Diagnosis not present

## 2021-03-24 DIAGNOSIS — N189 Chronic kidney disease, unspecified: Secondary | ICD-10-CM | POA: Diagnosis not present

## 2021-03-24 DIAGNOSIS — C61 Malignant neoplasm of prostate: Secondary | ICD-10-CM | POA: Diagnosis not present

## 2021-03-24 DIAGNOSIS — M1711 Unilateral primary osteoarthritis, right knee: Secondary | ICD-10-CM | POA: Diagnosis not present

## 2021-03-24 DIAGNOSIS — G4739 Other sleep apnea: Secondary | ICD-10-CM | POA: Diagnosis not present

## 2021-03-24 DIAGNOSIS — E11649 Type 2 diabetes mellitus with hypoglycemia without coma: Secondary | ICD-10-CM | POA: Diagnosis not present

## 2021-03-24 DIAGNOSIS — F0394 Unspecified dementia, unspecified severity, with anxiety: Secondary | ICD-10-CM | POA: Diagnosis not present

## 2021-03-24 DIAGNOSIS — G2581 Restless legs syndrome: Secondary | ICD-10-CM | POA: Diagnosis not present

## 2021-03-24 DIAGNOSIS — E1122 Type 2 diabetes mellitus with diabetic chronic kidney disease: Secondary | ICD-10-CM | POA: Diagnosis not present

## 2021-03-24 NOTE — Progress Notes (Signed)
03/24/2021 9:59 AM   Todd Davidson [birth year] [id number]  Referring provider: Perrin Maltese, MD [address] [place],  [place] [address]  Chief Complaint  Patient presents with   Prostate Cancer    Urologic history: 1.  Clinical T1c intermediate risk (favorable) prostate cancer  -PSA 13.6; MRI PI-RADS 4 lesion; 1/2 cores positive Gleason 3+4 adenocarcinoma -2/12 cores positive Gleason 3+3 on standard template -Elected IMRT completed July 2020   2.  3.5 cm enhancing left renal mass -Incidentally discovered abdominal CT  -Percutaneous cryoablation by IR March 2021 -Renal biopsy + clear-cell carcinoma   HPI: 86 y.o. male presents for annual follow-up.  He is accompanied by his brother who is his HCPOA  Since last years visit he has had progressive Alzheimer's dementia and is currently residing in a memory care unit. No bothersome LUTS or reports of dysuria/gross hematuria Follow-up CT ordered by IR 03/12/2021 showed some interval growth of a deep margin enhancing nodule measuring 12 mm (9 mm previous scan) consistent with residual/recurrent tumor Follow-up telephone visit by Dr. Annamaria Boots with his brother yesterday.  The nodule is amenable to repeat cryoablation.  His brother was not certain he wanted to proceed and wanted to discuss with me.  The decision on whether to proceed with additional surveillance was also discussed based on his progressive dementia   PMH: Past Medical History:  Diagnosis Date   Arthritis    neck, shoulders, hips, knees   Asthma    as child, out grew   Barrett esophagus    BPH (benign prostatic hyperplasia)    Coronary artery disease    annual stress with Dr. Humphrey Rolls. no new findings   Coronary artery disease    Dementia ([place])    Depression    Diabetes mellitus without complication ([place])    no medications currently was on insulin but was dropping blood sugars too low causing syncope   Diverticulosis    Duodenitis    Gastritis    GERD  (gastroesophageal reflux disease)    History of COVID-19 02/2019   History of kidney stones    Hypercholesteremia    Hypertension    Left renal mass    Leg fracture, right    wears brace   Neuromuscular disorder (HCC)    neuropathy - feet   Occasional tremors    hands   Prostate cancer ([place])    Renal insufficiency    Sleep apnea    doesn't wear CPAP, pt denies   Wears dentures    full upper    Surgical History: Past Surgical History:  Procedure Laterality Date   [place], 2000   stents placed   CATARACT EXTRACTION W/PHACO Left 10/29/2014   Procedure: CATARACT EXTRACTION PHACO AND INTRAOCULAR LENS PLACEMENT ([place]);  Surgeon: Leandrew Koyanagi, MD;  Location: [place];  Service: Ophthalmology;  Laterality: Left;  DIABETIC - insulin MALYUGIN   CERVICAL FUSION     CORONARY ANGIOPLASTY     ESOPHAGOGASTRODUODENOSCOPY N/A 12/29/2017   Procedure: ESOPHAGOGASTRODUODENOSCOPY (EGD);  Surgeon: [name], MD;  Location[facility] ENDOSCOPY;  Service: Endoscopy;  Laterality: N/A;   ESOPHAGOGASTRODUODENOSCOPY (EGD) WITH PROPOFOL N/A 12/17/2015   Procedure: ESOPHAGOGASTRODUODENOSCOPY (EGD) WITH PROPOFOL;  Surgeon: [name], MD;  Location: [facility] ENDOSCOPY;  Service: Endoscopy;  Laterality: N/A;   EYE SURGERY Right    HERNIA REPAIR     x2   I & D EXTREMITY Right 10/31/2018   Procedure: IRRIGATION AND DEBRIDEMENT  RIGHT KNEE  EXTERNAL FIXATION OF FRACTURE;  Surgeon: Shona Needles, MD;  Location: [place];  Service: Orthopedics;  Laterality: Right;  IRRIGATION AND DEBRIDEMENT RIGHT KNEE  EXTERNAL FIXATION OF FRACTURE   IR RADIOLOGIST EVAL & MGMT  02/28/2019   IR RADIOLOGIST EVAL & MGMT  04/30/2019   IR RADIOLOGIST EVAL & MGMT  07/02/2019   IR RADIOLOGIST EVAL & MGMT  10/10/2019   IR RADIOLOGIST EVAL & MGMT  06/25/2020   IR RADIOLOGIST EVAL & MGMT  10/14/2020   IR RADIOLOGIST EVAL & MGMT  03/23/2021   IRRIGATION AND DEBRIDEMENT KNEE Right  10/31/2018   JOINT REPLACEMENT     knee   KNEE FUSION     1973    KNEE SURGERY Right    joint removal with fusion and graft   NISSEN FUNDOPLICATION     RADIOFREQUENCY ABLATION Left 06/05/2019   Procedure: LEFT RENAL  CRYO ABLATION;  Surgeon: Greggory Keen, MD;  [facility];  Service: Anesthesiology;  Laterality: Left;    Home Medications:  Allergies as of 03/24/2021       Reactions   Macrolides And Ketolides Swelling   "-mycin" drugs "-mycin" drugs "-mycin" drugs   Erythromycin    Other reaction(s): Unknown   Propranolol    Sulfa Antibiotics Swelling, Other (See Comments)   Other reaction(s): Other (See Comments)   Sulfa Antibiotics Swelling   Sulfasalazine Swelling   Tape Other (See Comments)   Irritates the skin - raw   Tapentadol Other (See Comments)   Heavy tape causes raw skin   Tapentadol    Telbivudine Swelling   "mycin" drugs   Tape Other (See Comments), Rash   Heavy tape causes raw skin        Medication List        Accurate as of March 24, 2021  9:59 AM. If you have any questions, ask your nurse or doctor.          acetaminophen 325 MG tablet Commonly known as: TYLENOL Take 2 tablets (650 mg total) by mouth every 6 (six) hours as needed for mild pain.   amLODipine 10 MG tablet Commonly known as: NORVASC Take 10 mg by mouth daily.   aspirin 81 MG chewable tablet Chew 81 mg by mouth daily. AM   benazepril 40 MG tablet Commonly known as: LOTENSIN Take 40 mg by mouth at bedtime.   citalopram 10 MG tablet Commonly known as: CELEXA Take 10 mg by mouth at bedtime.   clotrimazole 1 % cream Commonly known as: LOTRIMIN Apply 1 application topically 2 (two) times daily.   feeding supplement (NEPRO CARB STEADY) Liqd Take 237 mLs by mouth 3 (three) times daily between meals.   gabapentin 300 MG capsule Commonly known as: NEURONTIN Take 300 mg by mouth 2 (two) times daily.   guaiFENesin 600 MG 12 hr tablet Commonly known as:  MUCINEX Take 600 mg by mouth 2 (two) times daily.   hydrocortisone 2.5 % rectal cream Commonly known as: ANUSOL-HC Apply 1 application topically daily as needed.   ipratropium-albuterol 0.5-2.5 (3) MG/3ML Soln Commonly known as: DUONEB Take 3 mLs by nebulization.   omeprazole 40 MG capsule Commonly known as: PRILOSEC Take 40 mg by mouth daily.   QUEtiapine 25 MG tablet Commonly known as: SEROQUEL Take 1 tablet (25 mg total) by mouth at bedtime.   rosuvastatin 40 MG tablet Commonly known as: CRESTOR Take 40 mg by mouth daily.   Trelegy Ellipta 100-62.5-25 MCG/ACT Aepb Generic drug:  Fluticasone-Umeclidin-Vilant Inhale 1 puff into the lungs daily.        Allergies:  Allergies  Allergen Reactions   Macrolides And Ketolides Swelling    "-mycin" drugs "-mycin" drugs "-mycin" drugs    Erythromycin     Other reaction(s): Unknown   Propranolol    Sulfa Antibiotics Swelling and Other (See Comments)    Other reaction(s): Other (See Comments)   Sulfa Antibiotics Swelling   Sulfasalazine Swelling   Tape Other (See Comments)    Irritates the skin - raw   Tapentadol Other (See Comments)    Heavy tape causes raw skin   Tapentadol    Telbivudine Swelling    "mycin" drugs    Tape Other (See Comments) and Rash    Heavy tape causes raw skin    Family History: Family History  Problem Relation Age of Onset   Prostate cancer Father    Heart disease Brother    Stroke Mother    Breast cancer Sister     Social History:  reports that he quit smoking about 48 years ago. His smoking use included cigarettes, pipe, and cigars. He has a 20.00 pack-year smoking history. His smokeless tobacco use includes snuff. He reports current alcohol use of about 25.0 standard drinks per week. He reports that he does not use drugs.   Physical Exam: BP 113/65    Pulse 72    Ht 6' (1.829 m)    Wt 140 lb (63.5 kg)    BMI 18.99 kg/m   Constitutional:  Alert, No acute distress. HEENT: [place] AT,  moist mucus membranes.  Trachea midline, no masses. Cardiovascular: No clubbing, cyanosis, or edema. Respiratory: Normal respiratory effort, no increased work of breathing.   Assessment & Plan:    1.  Prostate cancer Prior PSAs have remained low and stable I did discuss with his brother where that he wanted to continue PSA monitoring which he elected to pursue and a PSA was ordered today  2.  Renal cell carcinoma Residual/recurrent tumor and ablation bed I discussed with his brother based on his progressive dementia and tumor size that it is unlikely this would develop into metastatic disease We discussed cryoablation though minimally invasive does involve general anesthesia and could worsen his dementia.   He would be interested in continued surveillance and will message Dr. Estrella Myrtle, [facility] [address] [place], [place] [address] [phone]

## 2021-03-25 DIAGNOSIS — I2510 Atherosclerotic heart disease of native coronary artery without angina pectoris: Secondary | ICD-10-CM | POA: Diagnosis not present

## 2021-03-25 DIAGNOSIS — M1711 Unilateral primary osteoarthritis, right knee: Secondary | ICD-10-CM | POA: Diagnosis not present

## 2021-03-25 DIAGNOSIS — E11649 Type 2 diabetes mellitus with hypoglycemia without coma: Secondary | ICD-10-CM | POA: Diagnosis not present

## 2021-03-25 DIAGNOSIS — Z8616 Personal history of COVID-19: Secondary | ICD-10-CM | POA: Diagnosis not present

## 2021-03-25 DIAGNOSIS — R269 Unspecified abnormalities of gait and mobility: Secondary | ICD-10-CM | POA: Diagnosis not present

## 2021-03-25 DIAGNOSIS — G4739 Other sleep apnea: Secondary | ICD-10-CM | POA: Diagnosis not present

## 2021-03-25 DIAGNOSIS — G2581 Restless legs syndrome: Secondary | ICD-10-CM | POA: Diagnosis not present

## 2021-03-25 DIAGNOSIS — I129 Hypertensive chronic kidney disease with stage 1 through stage 4 chronic kidney disease, or unspecified chronic kidney disease: Secondary | ICD-10-CM | POA: Diagnosis not present

## 2021-03-25 DIAGNOSIS — J441 Chronic obstructive pulmonary disease with (acute) exacerbation: Secondary | ICD-10-CM | POA: Diagnosis not present

## 2021-03-25 DIAGNOSIS — M5030 Other cervical disc degeneration, unspecified cervical region: Secondary | ICD-10-CM | POA: Diagnosis not present

## 2021-03-25 DIAGNOSIS — F0393 Unspecified dementia, unspecified severity, with mood disturbance: Secondary | ICD-10-CM | POA: Diagnosis not present

## 2021-03-25 DIAGNOSIS — G9341 Metabolic encephalopathy: Secondary | ICD-10-CM | POA: Diagnosis not present

## 2021-03-25 DIAGNOSIS — R053 Chronic cough: Secondary | ICD-10-CM | POA: Diagnosis not present

## 2021-03-25 DIAGNOSIS — F32A Depression, unspecified: Secondary | ICD-10-CM | POA: Diagnosis not present

## 2021-03-25 DIAGNOSIS — F03911 Unspecified dementia, unspecified severity, with agitation: Secondary | ICD-10-CM | POA: Diagnosis not present

## 2021-03-25 DIAGNOSIS — H8110 Benign paroxysmal vertigo, unspecified ear: Secondary | ICD-10-CM | POA: Diagnosis not present

## 2021-03-25 DIAGNOSIS — Z993 Dependence on wheelchair: Secondary | ICD-10-CM | POA: Diagnosis not present

## 2021-03-25 DIAGNOSIS — E1122 Type 2 diabetes mellitus with diabetic chronic kidney disease: Secondary | ICD-10-CM | POA: Diagnosis not present

## 2021-03-25 DIAGNOSIS — Z7982 Long term (current) use of aspirin: Secondary | ICD-10-CM | POA: Diagnosis not present

## 2021-03-25 DIAGNOSIS — E785 Hyperlipidemia, unspecified: Secondary | ICD-10-CM | POA: Diagnosis not present

## 2021-03-25 DIAGNOSIS — E119 Type 2 diabetes mellitus without complications: Secondary | ICD-10-CM | POA: Diagnosis not present

## 2021-03-25 DIAGNOSIS — Z87891 Personal history of nicotine dependence: Secondary | ICD-10-CM | POA: Diagnosis not present

## 2021-03-25 DIAGNOSIS — N189 Chronic kidney disease, unspecified: Secondary | ICD-10-CM | POA: Diagnosis not present

## 2021-03-25 DIAGNOSIS — F0394 Unspecified dementia, unspecified severity, with anxiety: Secondary | ICD-10-CM | POA: Diagnosis not present

## 2021-03-25 DIAGNOSIS — D696 Thrombocytopenia, unspecified: Secondary | ICD-10-CM | POA: Diagnosis not present

## 2021-03-25 DIAGNOSIS — I10 Essential (primary) hypertension: Secondary | ICD-10-CM | POA: Diagnosis not present

## 2021-03-25 DIAGNOSIS — K219 Gastro-esophageal reflux disease without esophagitis: Secondary | ICD-10-CM | POA: Diagnosis not present

## 2021-03-25 DIAGNOSIS — J449 Chronic obstructive pulmonary disease, unspecified: Secondary | ICD-10-CM | POA: Diagnosis not present

## 2021-03-25 LAB — PSA: Prostate Specific Ag, Serum: 0.8 ng/mL (ref 0.0–4.0)

## 2021-03-26 ENCOUNTER — Telehealth: Payer: Self-pay | Admitting: *Deleted

## 2021-03-26 NOTE — Telephone Encounter (Signed)
Notified patient brother as instructed,

## 2021-03-26 NOTE — Telephone Encounter (Signed)
-----   Message from Abbie Sons, MD sent at 03/26/2021  7:09 AM EST ----- PSA stable at 0.8.  Schedule I year follow-up.  Patient is in a memory care unit with dementia and brother is his POA.  Also let brother know I contacted Dr. Annamaria Boots and radiology will be calling to schedule follow-up imaging for his renal mass

## 2021-03-30 DIAGNOSIS — Z7982 Long term (current) use of aspirin: Secondary | ICD-10-CM | POA: Diagnosis not present

## 2021-03-30 DIAGNOSIS — Z87891 Personal history of nicotine dependence: Secondary | ICD-10-CM | POA: Diagnosis not present

## 2021-03-30 DIAGNOSIS — D696 Thrombocytopenia, unspecified: Secondary | ICD-10-CM | POA: Diagnosis not present

## 2021-03-30 DIAGNOSIS — K219 Gastro-esophageal reflux disease without esophagitis: Secondary | ICD-10-CM | POA: Diagnosis not present

## 2021-03-30 DIAGNOSIS — G4739 Other sleep apnea: Secondary | ICD-10-CM | POA: Diagnosis not present

## 2021-03-30 DIAGNOSIS — M1711 Unilateral primary osteoarthritis, right knee: Secondary | ICD-10-CM | POA: Diagnosis not present

## 2021-03-30 DIAGNOSIS — H8110 Benign paroxysmal vertigo, unspecified ear: Secondary | ICD-10-CM | POA: Diagnosis not present

## 2021-03-30 DIAGNOSIS — F03911 Unspecified dementia, unspecified severity, with agitation: Secondary | ICD-10-CM | POA: Diagnosis not present

## 2021-03-30 DIAGNOSIS — E1122 Type 2 diabetes mellitus with diabetic chronic kidney disease: Secondary | ICD-10-CM | POA: Diagnosis not present

## 2021-03-30 DIAGNOSIS — Z8616 Personal history of COVID-19: Secondary | ICD-10-CM | POA: Diagnosis not present

## 2021-03-30 DIAGNOSIS — F32A Depression, unspecified: Secondary | ICD-10-CM | POA: Diagnosis not present

## 2021-03-30 DIAGNOSIS — I129 Hypertensive chronic kidney disease with stage 1 through stage 4 chronic kidney disease, or unspecified chronic kidney disease: Secondary | ICD-10-CM | POA: Diagnosis not present

## 2021-03-30 DIAGNOSIS — J441 Chronic obstructive pulmonary disease with (acute) exacerbation: Secondary | ICD-10-CM | POA: Diagnosis not present

## 2021-03-30 DIAGNOSIS — F0393 Unspecified dementia, unspecified severity, with mood disturbance: Secondary | ICD-10-CM | POA: Diagnosis not present

## 2021-03-30 DIAGNOSIS — G9341 Metabolic encephalopathy: Secondary | ICD-10-CM | POA: Diagnosis not present

## 2021-03-30 DIAGNOSIS — F0394 Unspecified dementia, unspecified severity, with anxiety: Secondary | ICD-10-CM | POA: Diagnosis not present

## 2021-03-30 DIAGNOSIS — M5030 Other cervical disc degeneration, unspecified cervical region: Secondary | ICD-10-CM | POA: Diagnosis not present

## 2021-03-30 DIAGNOSIS — E785 Hyperlipidemia, unspecified: Secondary | ICD-10-CM | POA: Diagnosis not present

## 2021-03-30 DIAGNOSIS — N189 Chronic kidney disease, unspecified: Secondary | ICD-10-CM | POA: Diagnosis not present

## 2021-03-30 DIAGNOSIS — I2510 Atherosclerotic heart disease of native coronary artery without angina pectoris: Secondary | ICD-10-CM | POA: Diagnosis not present

## 2021-03-30 DIAGNOSIS — G2581 Restless legs syndrome: Secondary | ICD-10-CM | POA: Diagnosis not present

## 2021-03-30 DIAGNOSIS — E11649 Type 2 diabetes mellitus with hypoglycemia without coma: Secondary | ICD-10-CM | POA: Diagnosis not present

## 2021-04-01 DIAGNOSIS — M5030 Other cervical disc degeneration, unspecified cervical region: Secondary | ICD-10-CM | POA: Diagnosis not present

## 2021-04-01 DIAGNOSIS — G2581 Restless legs syndrome: Secondary | ICD-10-CM | POA: Diagnosis not present

## 2021-04-01 DIAGNOSIS — F03911 Unspecified dementia, unspecified severity, with agitation: Secondary | ICD-10-CM | POA: Diagnosis not present

## 2021-04-01 DIAGNOSIS — F0394 Unspecified dementia, unspecified severity, with anxiety: Secondary | ICD-10-CM | POA: Diagnosis not present

## 2021-04-01 DIAGNOSIS — E785 Hyperlipidemia, unspecified: Secondary | ICD-10-CM | POA: Diagnosis not present

## 2021-04-01 DIAGNOSIS — D696 Thrombocytopenia, unspecified: Secondary | ICD-10-CM | POA: Diagnosis not present

## 2021-04-01 DIAGNOSIS — E11649 Type 2 diabetes mellitus with hypoglycemia without coma: Secondary | ICD-10-CM | POA: Diagnosis not present

## 2021-04-01 DIAGNOSIS — F32A Depression, unspecified: Secondary | ICD-10-CM | POA: Diagnosis not present

## 2021-04-01 DIAGNOSIS — H8110 Benign paroxysmal vertigo, unspecified ear: Secondary | ICD-10-CM | POA: Diagnosis not present

## 2021-04-01 DIAGNOSIS — M1711 Unilateral primary osteoarthritis, right knee: Secondary | ICD-10-CM | POA: Diagnosis not present

## 2021-04-01 DIAGNOSIS — E1122 Type 2 diabetes mellitus with diabetic chronic kidney disease: Secondary | ICD-10-CM | POA: Diagnosis not present

## 2021-04-01 DIAGNOSIS — Z8616 Personal history of COVID-19: Secondary | ICD-10-CM | POA: Diagnosis not present

## 2021-04-01 DIAGNOSIS — K219 Gastro-esophageal reflux disease without esophagitis: Secondary | ICD-10-CM | POA: Diagnosis not present

## 2021-04-01 DIAGNOSIS — N189 Chronic kidney disease, unspecified: Secondary | ICD-10-CM | POA: Diagnosis not present

## 2021-04-01 DIAGNOSIS — F0393 Unspecified dementia, unspecified severity, with mood disturbance: Secondary | ICD-10-CM | POA: Diagnosis not present

## 2021-04-01 DIAGNOSIS — Z87891 Personal history of nicotine dependence: Secondary | ICD-10-CM | POA: Diagnosis not present

## 2021-04-01 DIAGNOSIS — G4739 Other sleep apnea: Secondary | ICD-10-CM | POA: Diagnosis not present

## 2021-04-01 DIAGNOSIS — Z7982 Long term (current) use of aspirin: Secondary | ICD-10-CM | POA: Diagnosis not present

## 2021-04-01 DIAGNOSIS — J441 Chronic obstructive pulmonary disease with (acute) exacerbation: Secondary | ICD-10-CM | POA: Diagnosis not present

## 2021-04-01 DIAGNOSIS — I2510 Atherosclerotic heart disease of native coronary artery without angina pectoris: Secondary | ICD-10-CM | POA: Diagnosis not present

## 2021-04-01 DIAGNOSIS — I129 Hypertensive chronic kidney disease with stage 1 through stage 4 chronic kidney disease, or unspecified chronic kidney disease: Secondary | ICD-10-CM | POA: Diagnosis not present

## 2021-04-01 DIAGNOSIS — G9341 Metabolic encephalopathy: Secondary | ICD-10-CM | POA: Diagnosis not present

## 2021-04-07 DIAGNOSIS — M1711 Unilateral primary osteoarthritis, right knee: Secondary | ICD-10-CM | POA: Diagnosis not present

## 2021-04-07 DIAGNOSIS — G9341 Metabolic encephalopathy: Secondary | ICD-10-CM | POA: Diagnosis not present

## 2021-04-07 DIAGNOSIS — K219 Gastro-esophageal reflux disease without esophagitis: Secondary | ICD-10-CM | POA: Diagnosis not present

## 2021-04-07 DIAGNOSIS — Z87891 Personal history of nicotine dependence: Secondary | ICD-10-CM | POA: Diagnosis not present

## 2021-04-07 DIAGNOSIS — E1122 Type 2 diabetes mellitus with diabetic chronic kidney disease: Secondary | ICD-10-CM | POA: Diagnosis not present

## 2021-04-07 DIAGNOSIS — Z8616 Personal history of COVID-19: Secondary | ICD-10-CM | POA: Diagnosis not present

## 2021-04-07 DIAGNOSIS — J441 Chronic obstructive pulmonary disease with (acute) exacerbation: Secondary | ICD-10-CM | POA: Diagnosis not present

## 2021-04-07 DIAGNOSIS — F0393 Unspecified dementia, unspecified severity, with mood disturbance: Secondary | ICD-10-CM | POA: Diagnosis not present

## 2021-04-07 DIAGNOSIS — G2581 Restless legs syndrome: Secondary | ICD-10-CM | POA: Diagnosis not present

## 2021-04-07 DIAGNOSIS — M5030 Other cervical disc degeneration, unspecified cervical region: Secondary | ICD-10-CM | POA: Diagnosis not present

## 2021-04-07 DIAGNOSIS — H8110 Benign paroxysmal vertigo, unspecified ear: Secondary | ICD-10-CM | POA: Diagnosis not present

## 2021-04-07 DIAGNOSIS — F0394 Unspecified dementia, unspecified severity, with anxiety: Secondary | ICD-10-CM | POA: Diagnosis not present

## 2021-04-07 DIAGNOSIS — E11649 Type 2 diabetes mellitus with hypoglycemia without coma: Secondary | ICD-10-CM | POA: Diagnosis not present

## 2021-04-07 DIAGNOSIS — D696 Thrombocytopenia, unspecified: Secondary | ICD-10-CM | POA: Diagnosis not present

## 2021-04-07 DIAGNOSIS — G4739 Other sleep apnea: Secondary | ICD-10-CM | POA: Diagnosis not present

## 2021-04-07 DIAGNOSIS — E785 Hyperlipidemia, unspecified: Secondary | ICD-10-CM | POA: Diagnosis not present

## 2021-04-07 DIAGNOSIS — F03911 Unspecified dementia, unspecified severity, with agitation: Secondary | ICD-10-CM | POA: Diagnosis not present

## 2021-04-07 DIAGNOSIS — Z7982 Long term (current) use of aspirin: Secondary | ICD-10-CM | POA: Diagnosis not present

## 2021-04-07 DIAGNOSIS — N189 Chronic kidney disease, unspecified: Secondary | ICD-10-CM | POA: Diagnosis not present

## 2021-04-07 DIAGNOSIS — I2510 Atherosclerotic heart disease of native coronary artery without angina pectoris: Secondary | ICD-10-CM | POA: Diagnosis not present

## 2021-04-07 DIAGNOSIS — I129 Hypertensive chronic kidney disease with stage 1 through stage 4 chronic kidney disease, or unspecified chronic kidney disease: Secondary | ICD-10-CM | POA: Diagnosis not present

## 2021-04-07 DIAGNOSIS — F32A Depression, unspecified: Secondary | ICD-10-CM | POA: Diagnosis not present

## 2021-04-08 DIAGNOSIS — M1711 Unilateral primary osteoarthritis, right knee: Secondary | ICD-10-CM | POA: Diagnosis not present

## 2021-04-08 DIAGNOSIS — I2510 Atherosclerotic heart disease of native coronary artery without angina pectoris: Secondary | ICD-10-CM | POA: Diagnosis not present

## 2021-04-08 DIAGNOSIS — F0394 Unspecified dementia, unspecified severity, with anxiety: Secondary | ICD-10-CM | POA: Diagnosis not present

## 2021-04-08 DIAGNOSIS — F03911 Unspecified dementia, unspecified severity, with agitation: Secondary | ICD-10-CM | POA: Diagnosis not present

## 2021-04-08 DIAGNOSIS — E1122 Type 2 diabetes mellitus with diabetic chronic kidney disease: Secondary | ICD-10-CM | POA: Diagnosis not present

## 2021-04-08 DIAGNOSIS — D696 Thrombocytopenia, unspecified: Secondary | ICD-10-CM | POA: Diagnosis not present

## 2021-04-08 DIAGNOSIS — Z7982 Long term (current) use of aspirin: Secondary | ICD-10-CM | POA: Diagnosis not present

## 2021-04-08 DIAGNOSIS — M5030 Other cervical disc degeneration, unspecified cervical region: Secondary | ICD-10-CM | POA: Diagnosis not present

## 2021-04-08 DIAGNOSIS — K219 Gastro-esophageal reflux disease without esophagitis: Secondary | ICD-10-CM | POA: Diagnosis not present

## 2021-04-08 DIAGNOSIS — I129 Hypertensive chronic kidney disease with stage 1 through stage 4 chronic kidney disease, or unspecified chronic kidney disease: Secondary | ICD-10-CM | POA: Diagnosis not present

## 2021-04-08 DIAGNOSIS — E11649 Type 2 diabetes mellitus with hypoglycemia without coma: Secondary | ICD-10-CM | POA: Diagnosis not present

## 2021-04-08 DIAGNOSIS — Z87891 Personal history of nicotine dependence: Secondary | ICD-10-CM | POA: Diagnosis not present

## 2021-04-08 DIAGNOSIS — G9341 Metabolic encephalopathy: Secondary | ICD-10-CM | POA: Diagnosis not present

## 2021-04-08 DIAGNOSIS — N189 Chronic kidney disease, unspecified: Secondary | ICD-10-CM | POA: Diagnosis not present

## 2021-04-08 DIAGNOSIS — G2581 Restless legs syndrome: Secondary | ICD-10-CM | POA: Diagnosis not present

## 2021-04-08 DIAGNOSIS — G4739 Other sleep apnea: Secondary | ICD-10-CM | POA: Diagnosis not present

## 2021-04-08 DIAGNOSIS — J441 Chronic obstructive pulmonary disease with (acute) exacerbation: Secondary | ICD-10-CM | POA: Diagnosis not present

## 2021-04-08 DIAGNOSIS — H8110 Benign paroxysmal vertigo, unspecified ear: Secondary | ICD-10-CM | POA: Diagnosis not present

## 2021-04-08 DIAGNOSIS — E785 Hyperlipidemia, unspecified: Secondary | ICD-10-CM | POA: Diagnosis not present

## 2021-04-08 DIAGNOSIS — F0393 Unspecified dementia, unspecified severity, with mood disturbance: Secondary | ICD-10-CM | POA: Diagnosis not present

## 2021-04-08 DIAGNOSIS — F32A Depression, unspecified: Secondary | ICD-10-CM | POA: Diagnosis not present

## 2021-04-08 DIAGNOSIS — Z8616 Personal history of COVID-19: Secondary | ICD-10-CM | POA: Diagnosis not present

## 2021-04-12 DIAGNOSIS — M5030 Other cervical disc degeneration, unspecified cervical region: Secondary | ICD-10-CM | POA: Diagnosis not present

## 2021-04-12 DIAGNOSIS — H8110 Benign paroxysmal vertigo, unspecified ear: Secondary | ICD-10-CM | POA: Diagnosis not present

## 2021-04-12 DIAGNOSIS — I129 Hypertensive chronic kidney disease with stage 1 through stage 4 chronic kidney disease, or unspecified chronic kidney disease: Secondary | ICD-10-CM | POA: Diagnosis not present

## 2021-04-12 DIAGNOSIS — M1711 Unilateral primary osteoarthritis, right knee: Secondary | ICD-10-CM | POA: Diagnosis not present

## 2021-04-12 DIAGNOSIS — E1122 Type 2 diabetes mellitus with diabetic chronic kidney disease: Secondary | ICD-10-CM | POA: Diagnosis not present

## 2021-04-12 DIAGNOSIS — G9341 Metabolic encephalopathy: Secondary | ICD-10-CM | POA: Diagnosis not present

## 2021-04-12 DIAGNOSIS — F32A Depression, unspecified: Secondary | ICD-10-CM | POA: Diagnosis not present

## 2021-04-12 DIAGNOSIS — E11649 Type 2 diabetes mellitus with hypoglycemia without coma: Secondary | ICD-10-CM | POA: Diagnosis not present

## 2021-04-12 DIAGNOSIS — I2510 Atherosclerotic heart disease of native coronary artery without angina pectoris: Secondary | ICD-10-CM | POA: Diagnosis not present

## 2021-04-12 DIAGNOSIS — Z87891 Personal history of nicotine dependence: Secondary | ICD-10-CM | POA: Diagnosis not present

## 2021-04-12 DIAGNOSIS — F03911 Unspecified dementia, unspecified severity, with agitation: Secondary | ICD-10-CM | POA: Diagnosis not present

## 2021-04-12 DIAGNOSIS — J441 Chronic obstructive pulmonary disease with (acute) exacerbation: Secondary | ICD-10-CM | POA: Diagnosis not present

## 2021-04-12 DIAGNOSIS — G4739 Other sleep apnea: Secondary | ICD-10-CM | POA: Diagnosis not present

## 2021-04-12 DIAGNOSIS — Z8616 Personal history of COVID-19: Secondary | ICD-10-CM | POA: Diagnosis not present

## 2021-04-12 DIAGNOSIS — Z7982 Long term (current) use of aspirin: Secondary | ICD-10-CM | POA: Diagnosis not present

## 2021-04-12 DIAGNOSIS — K219 Gastro-esophageal reflux disease without esophagitis: Secondary | ICD-10-CM | POA: Diagnosis not present

## 2021-04-12 DIAGNOSIS — E785 Hyperlipidemia, unspecified: Secondary | ICD-10-CM | POA: Diagnosis not present

## 2021-04-12 DIAGNOSIS — G2581 Restless legs syndrome: Secondary | ICD-10-CM | POA: Diagnosis not present

## 2021-04-12 DIAGNOSIS — F0393 Unspecified dementia, unspecified severity, with mood disturbance: Secondary | ICD-10-CM | POA: Diagnosis not present

## 2021-04-12 DIAGNOSIS — F0394 Unspecified dementia, unspecified severity, with anxiety: Secondary | ICD-10-CM | POA: Diagnosis not present

## 2021-04-12 DIAGNOSIS — N189 Chronic kidney disease, unspecified: Secondary | ICD-10-CM | POA: Diagnosis not present

## 2021-04-12 DIAGNOSIS — D696 Thrombocytopenia, unspecified: Secondary | ICD-10-CM | POA: Diagnosis not present

## 2021-04-15 ENCOUNTER — Other Ambulatory Visit: Payer: Self-pay | Admitting: Interventional Radiology

## 2021-04-15 DIAGNOSIS — N2889 Other specified disorders of kidney and ureter: Secondary | ICD-10-CM

## 2021-04-19 DIAGNOSIS — F03911 Unspecified dementia, unspecified severity, with agitation: Secondary | ICD-10-CM | POA: Diagnosis not present

## 2021-04-19 DIAGNOSIS — K219 Gastro-esophageal reflux disease without esophagitis: Secondary | ICD-10-CM | POA: Diagnosis not present

## 2021-04-19 DIAGNOSIS — Z87891 Personal history of nicotine dependence: Secondary | ICD-10-CM | POA: Diagnosis not present

## 2021-04-19 DIAGNOSIS — M5030 Other cervical disc degeneration, unspecified cervical region: Secondary | ICD-10-CM | POA: Diagnosis not present

## 2021-04-19 DIAGNOSIS — E1122 Type 2 diabetes mellitus with diabetic chronic kidney disease: Secondary | ICD-10-CM | POA: Diagnosis not present

## 2021-04-19 DIAGNOSIS — E11649 Type 2 diabetes mellitus with hypoglycemia without coma: Secondary | ICD-10-CM | POA: Diagnosis not present

## 2021-04-19 DIAGNOSIS — E785 Hyperlipidemia, unspecified: Secondary | ICD-10-CM | POA: Diagnosis not present

## 2021-04-19 DIAGNOSIS — I2510 Atherosclerotic heart disease of native coronary artery without angina pectoris: Secondary | ICD-10-CM | POA: Diagnosis not present

## 2021-04-19 DIAGNOSIS — G2581 Restless legs syndrome: Secondary | ICD-10-CM | POA: Diagnosis not present

## 2021-04-19 DIAGNOSIS — F0394 Unspecified dementia, unspecified severity, with anxiety: Secondary | ICD-10-CM | POA: Diagnosis not present

## 2021-04-19 DIAGNOSIS — I129 Hypertensive chronic kidney disease with stage 1 through stage 4 chronic kidney disease, or unspecified chronic kidney disease: Secondary | ICD-10-CM | POA: Diagnosis not present

## 2021-04-19 DIAGNOSIS — Z7982 Long term (current) use of aspirin: Secondary | ICD-10-CM | POA: Diagnosis not present

## 2021-04-19 DIAGNOSIS — H8110 Benign paroxysmal vertigo, unspecified ear: Secondary | ICD-10-CM | POA: Diagnosis not present

## 2021-04-19 DIAGNOSIS — N189 Chronic kidney disease, unspecified: Secondary | ICD-10-CM | POA: Diagnosis not present

## 2021-04-19 DIAGNOSIS — M1711 Unilateral primary osteoarthritis, right knee: Secondary | ICD-10-CM | POA: Diagnosis not present

## 2021-04-19 DIAGNOSIS — F0393 Unspecified dementia, unspecified severity, with mood disturbance: Secondary | ICD-10-CM | POA: Diagnosis not present

## 2021-04-19 DIAGNOSIS — G9341 Metabolic encephalopathy: Secondary | ICD-10-CM | POA: Diagnosis not present

## 2021-04-19 DIAGNOSIS — F32A Depression, unspecified: Secondary | ICD-10-CM | POA: Diagnosis not present

## 2021-04-19 DIAGNOSIS — J441 Chronic obstructive pulmonary disease with (acute) exacerbation: Secondary | ICD-10-CM | POA: Diagnosis not present

## 2021-04-19 DIAGNOSIS — Z8616 Personal history of COVID-19: Secondary | ICD-10-CM | POA: Diagnosis not present

## 2021-04-19 DIAGNOSIS — G4739 Other sleep apnea: Secondary | ICD-10-CM | POA: Diagnosis not present

## 2021-04-19 DIAGNOSIS — D696 Thrombocytopenia, unspecified: Secondary | ICD-10-CM | POA: Diagnosis not present

## 2021-04-21 DIAGNOSIS — E1122 Type 2 diabetes mellitus with diabetic chronic kidney disease: Secondary | ICD-10-CM | POA: Diagnosis not present

## 2021-04-21 DIAGNOSIS — H8110 Benign paroxysmal vertigo, unspecified ear: Secondary | ICD-10-CM | POA: Diagnosis not present

## 2021-04-21 DIAGNOSIS — F0393 Unspecified dementia, unspecified severity, with mood disturbance: Secondary | ICD-10-CM | POA: Diagnosis not present

## 2021-04-21 DIAGNOSIS — G2581 Restless legs syndrome: Secondary | ICD-10-CM | POA: Diagnosis not present

## 2021-04-21 DIAGNOSIS — E1165 Type 2 diabetes mellitus with hyperglycemia: Secondary | ICD-10-CM | POA: Diagnosis not present

## 2021-04-21 DIAGNOSIS — G9341 Metabolic encephalopathy: Secondary | ICD-10-CM | POA: Diagnosis not present

## 2021-04-21 DIAGNOSIS — G4739 Other sleep apnea: Secondary | ICD-10-CM | POA: Diagnosis not present

## 2021-04-21 DIAGNOSIS — E11649 Type 2 diabetes mellitus with hypoglycemia without coma: Secondary | ICD-10-CM | POA: Diagnosis not present

## 2021-04-21 DIAGNOSIS — N189 Chronic kidney disease, unspecified: Secondary | ICD-10-CM | POA: Diagnosis not present

## 2021-04-21 DIAGNOSIS — F32A Depression, unspecified: Secondary | ICD-10-CM | POA: Diagnosis not present

## 2021-04-21 DIAGNOSIS — I10 Essential (primary) hypertension: Secondary | ICD-10-CM | POA: Diagnosis not present

## 2021-04-21 DIAGNOSIS — I2510 Atherosclerotic heart disease of native coronary artery without angina pectoris: Secondary | ICD-10-CM | POA: Diagnosis not present

## 2021-04-21 DIAGNOSIS — F03911 Unspecified dementia, unspecified severity, with agitation: Secondary | ICD-10-CM | POA: Diagnosis not present

## 2021-04-21 DIAGNOSIS — Z87891 Personal history of nicotine dependence: Secondary | ICD-10-CM | POA: Diagnosis not present

## 2021-04-21 DIAGNOSIS — E785 Hyperlipidemia, unspecified: Secondary | ICD-10-CM | POA: Diagnosis not present

## 2021-04-21 DIAGNOSIS — Z8616 Personal history of COVID-19: Secondary | ICD-10-CM | POA: Diagnosis not present

## 2021-04-21 DIAGNOSIS — K219 Gastro-esophageal reflux disease without esophagitis: Secondary | ICD-10-CM | POA: Diagnosis not present

## 2021-04-21 DIAGNOSIS — G250 Essential tremor: Secondary | ICD-10-CM | POA: Diagnosis not present

## 2021-04-21 DIAGNOSIS — Z7982 Long term (current) use of aspirin: Secondary | ICD-10-CM | POA: Diagnosis not present

## 2021-04-21 DIAGNOSIS — F0394 Unspecified dementia, unspecified severity, with anxiety: Secondary | ICD-10-CM | POA: Diagnosis not present

## 2021-04-21 DIAGNOSIS — J441 Chronic obstructive pulmonary disease with (acute) exacerbation: Secondary | ICD-10-CM | POA: Diagnosis not present

## 2021-04-21 DIAGNOSIS — D696 Thrombocytopenia, unspecified: Secondary | ICD-10-CM | POA: Diagnosis not present

## 2021-04-21 DIAGNOSIS — M5030 Other cervical disc degeneration, unspecified cervical region: Secondary | ICD-10-CM | POA: Diagnosis not present

## 2021-04-21 DIAGNOSIS — I129 Hypertensive chronic kidney disease with stage 1 through stage 4 chronic kidney disease, or unspecified chronic kidney disease: Secondary | ICD-10-CM | POA: Diagnosis not present

## 2021-04-21 DIAGNOSIS — M1711 Unilateral primary osteoarthritis, right knee: Secondary | ICD-10-CM | POA: Diagnosis not present

## 2021-04-22 ENCOUNTER — Other Ambulatory Visit: Payer: Self-pay

## 2021-04-22 ENCOUNTER — Ambulatory Visit
Admission: RE | Admit: 2021-04-22 | Discharge: 2021-04-22 | Disposition: A | Discharge status: Home / Self Care | Payer: Medicare Other | Source: Ambulatory Visit | Attending: Interventional Radiology | Admitting: Interventional Radiology

## 2021-04-22 ENCOUNTER — Encounter: Payer: Self-pay | Admitting: *Deleted

## 2021-04-22 DIAGNOSIS — Z993 Dependence on wheelchair: Secondary | ICD-10-CM | POA: Diagnosis not present

## 2021-04-22 DIAGNOSIS — E119 Type 2 diabetes mellitus without complications: Secondary | ICD-10-CM | POA: Diagnosis not present

## 2021-04-22 DIAGNOSIS — I119 Hypertensive heart disease without heart failure: Secondary | ICD-10-CM | POA: Diagnosis not present

## 2021-04-22 DIAGNOSIS — F02C0 Dementia in other diseases classified elsewhere, severe, without behavioral disturbance, psychotic disturbance, mood disturbance, and anxiety: Secondary | ICD-10-CM | POA: Diagnosis not present

## 2021-04-22 DIAGNOSIS — Z9889 Other specified postprocedural states: Secondary | ICD-10-CM | POA: Diagnosis not present

## 2021-04-22 DIAGNOSIS — C642 Malignant neoplasm of left kidney, except renal pelvis: Secondary | ICD-10-CM | POA: Diagnosis not present

## 2021-04-22 DIAGNOSIS — G301 Alzheimer's disease with late onset: Secondary | ICD-10-CM | POA: Diagnosis not present

## 2021-04-22 DIAGNOSIS — K219 Gastro-esophageal reflux disease without esophagitis: Secondary | ICD-10-CM | POA: Diagnosis not present

## 2021-04-22 DIAGNOSIS — R269 Unspecified abnormalities of gait and mobility: Secondary | ICD-10-CM | POA: Diagnosis not present

## 2021-04-22 DIAGNOSIS — N2889 Other specified disorders of kidney and ureter: Secondary | ICD-10-CM

## 2021-04-22 HISTORY — PX: IR RADIOLOGIST EVAL & MGMT: IMG5224

## 2021-04-22 NOTE — Progress Notes (Signed)
Patient ID: Todd Davidson, male   DOB: 11-21-1935, 86 y.o.   MRN: [id number]       Chief Complaint:  Left renal cell carcinoma status post previous ablation  Referring Physician(s): Stoioff  History of Present Illness: Todd Davidson is a 86 y.o. male who is now 1 year 58-month status post left upper pole renal cell carcinoma biopsy and cryoablation performed at [facility].  Biopsy confirmed renal cell carcinoma.  He has multiple comorbidities including progressive Alzheimer's dementia, COPD, hypertension, diabetes, and CHF.  He also sees urology for prostate cancer.  Physically he is limited related to the dementia and he has a fused right knee but ambulates with a walker.  He is now in a nursing home/memory care unit for Alzheimer's dementia.  His brother is his power of attorney who I spoke with today again by telephone.  In review, his most recent surveillance imaging was 03/12/2021.  This again demonstrates an enhancing nodule along the deep medial margin of the ablation site measuring 12 mm compatible with residual or recurrent tumor along the medial ablation margin.  No adenopathy.  Stable renal cyst.  No acute renal process.   Past Medical History:  Diagnosis Date   Arthritis    neck, shoulders, hips, knees   Asthma    as child, out grew   Barrett esophagus    BPH (benign prostatic hyperplasia)    Coronary artery disease    annual stress with Dr. Humphrey Rolls. no new findings   Coronary artery disease    Dementia ([place])    Depression    Diabetes mellitus without complication ([place])    no medications currently was on insulin but was dropping blood sugars too low causing syncope   Diverticulosis    Duodenitis    Gastritis    GERD (gastroesophageal reflux disease)    History of COVID-19 02/2019   History of kidney stones    Hypercholesteremia    Hypertension    Left renal mass    Leg fracture, right    wears brace   Neuromuscular disorder (HCC)    neuropathy - feet    Occasional tremors    hands   Prostate cancer ([place])    Renal insufficiency    Sleep apnea    doesn't wear CPAP, pt denies   Wears dentures    full upper    Past Surgical History:  Procedure Laterality Date   [place], 2000   stents placed   CATARACT EXTRACTION W/PHACO Left 10/29/2014   Procedure: CATARACT EXTRACTION PHACO AND INTRAOCULAR LENS PLACEMENT ([place]);  Surgeon: Leandrew Koyanagi, MD;  Location: [facility];  Service: Ophthalmology;  Laterality: Left;  DIABETIC - insulin MALYUGIN   CERVICAL FUSION     CORONARY ANGIOPLASTY     ESOPHAGOGASTRODUODENOSCOPY N/A 12/29/2017   Procedure: ESOPHAGOGASTRODUODENOSCOPY (EGD);  Surgeon: [name], MD;  Location: [facility] ENDOSCOPY;  Service: Endoscopy;  Laterality: N/A;   ESOPHAGOGASTRODUODENOSCOPY (EGD) WITH PROPOFOL N/A 12/17/2015   Procedure: ESOPHAGOGASTRODUODENOSCOPY (EGD) WITH PROPOFOL;  Surgeon: [name], MD;  Location: [facility] ENDOSCOPY;  Service: Endoscopy;  Laterality: N/A;   EYE SURGERY Right    HERNIA REPAIR     x2   I & D EXTREMITY Right 10/31/2018   Procedure: IRRIGATION AND DEBRIDEMENT RIGHT KNEE  EXTERNAL FIXATION OF FRACTURE;  Surgeon: Shona Needles, MD;  Location: [facility];  Service: Orthopedics;  Laterality: Right;  IRRIGATION AND DEBRIDEMENT RIGHT KNEE  EXTERNAL FIXATION  OF FRACTURE   IR RADIOLOGIST EVAL & MGMT  02/28/2019   IR RADIOLOGIST EVAL & MGMT  04/30/2019   IR RADIOLOGIST EVAL & MGMT  07/02/2019   IR RADIOLOGIST EVAL & MGMT  10/10/2019   IR RADIOLOGIST EVAL & MGMT  06/25/2020   IR RADIOLOGIST EVAL & MGMT  10/14/2020   IR RADIOLOGIST EVAL & MGMT  03/23/2021   IRRIGATION AND DEBRIDEMENT KNEE Right 10/31/2018   JOINT REPLACEMENT     knee   KNEE FUSION     1973    KNEE SURGERY Right    joint removal with fusion and graft   NISSEN FUNDOPLICATION     RADIOFREQUENCY ABLATION Left 06/05/2019   Procedure: LEFT RENAL  CRYO ABLATION;  Surgeon: Greggory Keen, MD;   Location: [facility];  Service: Anesthesiology;  Laterality: Left;    Allergies: Macrolides and ketolides, Erythromycin, Propranolol, Sulfa antibiotics, Sulfa antibiotics, Sulfasalazine, Tape, Tapentadol, Tapentadol, Telbivudine, and Tape  Medications: Prior to Admission medications   Medication Sig Start Date End Date Taking? Authorizing Provider  acetaminophen (TYLENOL) 325 MG tablet Take 2 tablets (650 mg total) by mouth every 6 (six) hours as needed for mild pain. 11/05/18   Norm Parcel, PA-C  amLODipine (NORVASC) 10 MG tablet Take 10 mg by mouth daily. 09/04/18   [provider]  aspirin 81 MG chewable tablet Chew 81 mg by mouth daily. AM    [provider]  benazepril (LOTENSIN) 40 MG tablet Take 40 mg by mouth at bedtime.  09/04/18   [provider]  citalopram (CELEXA) 10 MG tablet Take 10 mg by mouth at bedtime.  10/30/18   [provider]  clotrimazole (LOTRIMIN) 1 % cream Apply 1 application topically 2 (two) times daily. 08/01/19   Arta Silence, MD  Fluticasone-Umeclidin-Vilant (TRELEGY ELLIPTA) 100-62.5-25 MCG/INH AEPB Inhale 1 puff into the lungs daily. 07/28/20   Lavera Guise, MD  gabapentin (NEURONTIN) 300 MG capsule Take 300 mg by mouth 2 (two) times daily.  09/27/18   [provider]  guaiFENesin (MUCINEX) 600 MG 12 hr tablet Take 600 mg by mouth 2 (two) times daily.    [provider]  hydrocortisone (ANUSOL-HC) 2.5 % rectal cream Apply 1 application topically daily as needed. 01/31/20   [provider]  ipratropium-albuterol (DUONEB) 0.5-2.5 (3) MG/3ML SOLN Take 3 mLs by nebulization.    [provider]  Nutritional Supplements (FEEDING SUPPLEMENT, NEPRO CARB STEADY,) LIQD Take 237 mLs by mouth 3 (three) times daily between meals. 01/17/21   Max Sane, MD  omeprazole (PRILOSEC) 40 MG capsule Take 40 mg by mouth daily.     [provider]  QUEtiapine (SEROQUEL) 25 MG tablet Take 1 tablet (25  mg total) by mouth at bedtime. 01/07/21   Sharen Hones, MD  rosuvastatin (CRESTOR) 40 MG tablet Take 40 mg by mouth daily. 02/02/21   [provider]     Family History  Problem Relation Age of Onset   Prostate cancer Father    Heart disease Brother    Stroke Mother    Breast cancer Sister     Social History   Socioeconomic History   Marital status: Widowed    Spouse name: Not on file   Number of children: Not on file   Years of education: Not on file   Highest education level: Not on file  Occupational History   Not on file  Tobacco Use   Smoking status: Former    Packs/day: 1.00  Years: 20.00    Pack years: 20.00    Types: Cigarettes, Pipe, Cigars    Quit date: 03/21/1973    Years since quitting: 48.1   Smokeless tobacco: Current    Types: Snuff   Tobacco comments:    SMOKELESS TOBACCO  Vaping Use   Vaping Use: Never used  Substance and Sexual Activity   Alcohol use: Yes    Alcohol/week: 25.0 standard drinks    Types: 25 Cans of beer per week    Comment: 4-6 a day   Drug use: Never   Sexual activity: Not Currently  Other Topics Concern   Not on file  Social History Narrative   ** Merged History Encounter **       Social Determinants of Health   Financial Resource Strain: Not on file  Food Insecurity: Not on file  Transportation Needs: Not on file  Physical Activity: Not on file  Stress: Not on file  Social Connections: Not on file    Review of Systems  Review of Systems: A 12 point ROS discussed and pertinent positives are indicated in the HPI above.  All other systems are negative.  Physical Exam No direct physical exam was performed, telephone health visit only today with his brother [name] his power of attorney.  Vital Signs: There were no vitals taken for this visit.  Imaging: IR Radiologist Eval & Mgmt  Result Date: 03/23/2021 Please refer to notes tab for details about interventional procedure. (Op Note)   Labs:  CBC: Recent  Labs    01/01/21 0723 01/02/21 0451 01/03/21 0528 01/07/21 0515  WBC 10.7* 10.3 8.8 5.8  HGB 12.5* 14.5 14.5 15.7  HCT 36.4* 41.0 42.4 45.6  PLT 109* 129* 126* 125*    COAGS: No results for input(s): INR, APTT in the last 8760 hours.  BMP: Recent Labs    01/01/21 0723 01/02/21 0451 01/03/21 0528 01/04/21 0743 01/07/21 0515 01/14/21 0658 03/12/21 0911  NA 138 142 142 144  --   --   --   K 3.3* 3.5 3.2* 3.6  --   --   --   CL 104 104 103 105  --   --   --   CO2 26 28 28 23   --   --   --   GLUCOSE 169* 120* 138* 145*  --   --   --   BUN 10 13 14 14   --   --   --   CALCIUM 8.5* 9.0 9.0 9.1  --   --   --   CREATININE 0.89 0.93 0.87 0.94 1.03 0.70 1.00  GFRNONAA >60 >60 >60 >60 >60 >60  --     LIVER FUNCTION TESTS: Recent Labs    12/31/20 0903  BILITOT 0.8  AST 33  ALT 16  ALKPHOS 74  PROT 7.5  ALBUMIN 4.0    Assessment and Plan:  Nearly 2 years status post left renal cell carcinoma cryoablation.  Surveillance imaging confirms slow enlargement of a medial deep margin enhancing nodule at the ablation defect now measuring 12 mm.  This is consistent with margin recurrence or residual tumor by CT.  Again, based on the patient's progressive dementia and tumor size it remains unlikely that this would develop into a more aggressive malignant process or metastatic disease.  Also with his advanced dementia, I would recommend to continue conservative approach.  Plan: Agree with continued CT surveillance.  Next CT will be performed in the summer (July 2023).  This will  be scheduled at [facility].  Electronically Signed: Greggory Keen 04/22/2021, 10:02 AM   I spent a total of    25 Minutes in remote  clinical consultation, greater than 50% of which was counseling/coordinating care for this patient with renal cell carcinoma status post ablation.    Visit type: Audio only (telephone). Audio (no video) only due to patient's lack of internet/smartphone  capability. Alternative for in-person consultation at [facility], [place] [place], [place], [place]. This visit type was conducted due to national recommendations for restrictions regarding the COVID-19 Pandemic (e.g. social distancing).  This format is felt to be most appropriate for this patient at this time.  All issues noted in this document were discussed and addressed.

## 2021-04-26 DIAGNOSIS — F32A Depression, unspecified: Secondary | ICD-10-CM | POA: Diagnosis not present

## 2021-04-26 DIAGNOSIS — D696 Thrombocytopenia, unspecified: Secondary | ICD-10-CM | POA: Diagnosis not present

## 2021-04-26 DIAGNOSIS — E785 Hyperlipidemia, unspecified: Secondary | ICD-10-CM | POA: Diagnosis not present

## 2021-04-26 DIAGNOSIS — K219 Gastro-esophageal reflux disease without esophagitis: Secondary | ICD-10-CM | POA: Diagnosis not present

## 2021-04-26 DIAGNOSIS — Z8616 Personal history of COVID-19: Secondary | ICD-10-CM | POA: Diagnosis not present

## 2021-04-26 DIAGNOSIS — N189 Chronic kidney disease, unspecified: Secondary | ICD-10-CM | POA: Diagnosis not present

## 2021-04-26 DIAGNOSIS — M5030 Other cervical disc degeneration, unspecified cervical region: Secondary | ICD-10-CM | POA: Diagnosis not present

## 2021-04-26 DIAGNOSIS — M1711 Unilateral primary osteoarthritis, right knee: Secondary | ICD-10-CM | POA: Diagnosis not present

## 2021-04-26 DIAGNOSIS — Z7982 Long term (current) use of aspirin: Secondary | ICD-10-CM | POA: Diagnosis not present

## 2021-04-26 DIAGNOSIS — E1122 Type 2 diabetes mellitus with diabetic chronic kidney disease: Secondary | ICD-10-CM | POA: Diagnosis not present

## 2021-04-26 DIAGNOSIS — I2510 Atherosclerotic heart disease of native coronary artery without angina pectoris: Secondary | ICD-10-CM | POA: Diagnosis not present

## 2021-04-26 DIAGNOSIS — F03911 Unspecified dementia, unspecified severity, with agitation: Secondary | ICD-10-CM | POA: Diagnosis not present

## 2021-04-26 DIAGNOSIS — H8110 Benign paroxysmal vertigo, unspecified ear: Secondary | ICD-10-CM | POA: Diagnosis not present

## 2021-04-26 DIAGNOSIS — G4739 Other sleep apnea: Secondary | ICD-10-CM | POA: Diagnosis not present

## 2021-04-26 DIAGNOSIS — I129 Hypertensive chronic kidney disease with stage 1 through stage 4 chronic kidney disease, or unspecified chronic kidney disease: Secondary | ICD-10-CM | POA: Diagnosis not present

## 2021-04-26 DIAGNOSIS — G2581 Restless legs syndrome: Secondary | ICD-10-CM | POA: Diagnosis not present

## 2021-04-26 DIAGNOSIS — G9341 Metabolic encephalopathy: Secondary | ICD-10-CM | POA: Diagnosis not present

## 2021-04-26 DIAGNOSIS — F0394 Unspecified dementia, unspecified severity, with anxiety: Secondary | ICD-10-CM | POA: Diagnosis not present

## 2021-04-26 DIAGNOSIS — Z87891 Personal history of nicotine dependence: Secondary | ICD-10-CM | POA: Diagnosis not present

## 2021-04-26 DIAGNOSIS — F0393 Unspecified dementia, unspecified severity, with mood disturbance: Secondary | ICD-10-CM | POA: Diagnosis not present

## 2021-04-26 DIAGNOSIS — J441 Chronic obstructive pulmonary disease with (acute) exacerbation: Secondary | ICD-10-CM | POA: Diagnosis not present

## 2021-04-26 DIAGNOSIS — E11649 Type 2 diabetes mellitus with hypoglycemia without coma: Secondary | ICD-10-CM | POA: Diagnosis not present

## 2021-04-27 DIAGNOSIS — K219 Gastro-esophageal reflux disease without esophagitis: Secondary | ICD-10-CM | POA: Diagnosis not present

## 2021-04-27 DIAGNOSIS — Z8616 Personal history of COVID-19: Secondary | ICD-10-CM | POA: Diagnosis not present

## 2021-04-27 DIAGNOSIS — E1122 Type 2 diabetes mellitus with diabetic chronic kidney disease: Secondary | ICD-10-CM | POA: Diagnosis not present

## 2021-04-27 DIAGNOSIS — M1711 Unilateral primary osteoarthritis, right knee: Secondary | ICD-10-CM | POA: Diagnosis not present

## 2021-04-27 DIAGNOSIS — F0394 Unspecified dementia, unspecified severity, with anxiety: Secondary | ICD-10-CM | POA: Diagnosis not present

## 2021-04-27 DIAGNOSIS — D696 Thrombocytopenia, unspecified: Secondary | ICD-10-CM | POA: Diagnosis not present

## 2021-04-27 DIAGNOSIS — F32A Depression, unspecified: Secondary | ICD-10-CM | POA: Diagnosis not present

## 2021-04-27 DIAGNOSIS — G9341 Metabolic encephalopathy: Secondary | ICD-10-CM | POA: Diagnosis not present

## 2021-04-27 DIAGNOSIS — I2510 Atherosclerotic heart disease of native coronary artery without angina pectoris: Secondary | ICD-10-CM | POA: Diagnosis not present

## 2021-04-27 DIAGNOSIS — F0393 Unspecified dementia, unspecified severity, with mood disturbance: Secondary | ICD-10-CM | POA: Diagnosis not present

## 2021-04-27 DIAGNOSIS — J441 Chronic obstructive pulmonary disease with (acute) exacerbation: Secondary | ICD-10-CM | POA: Diagnosis not present

## 2021-04-27 DIAGNOSIS — N189 Chronic kidney disease, unspecified: Secondary | ICD-10-CM | POA: Diagnosis not present

## 2021-04-27 DIAGNOSIS — G2581 Restless legs syndrome: Secondary | ICD-10-CM | POA: Diagnosis not present

## 2021-04-27 DIAGNOSIS — G4739 Other sleep apnea: Secondary | ICD-10-CM | POA: Diagnosis not present

## 2021-04-27 DIAGNOSIS — Z7982 Long term (current) use of aspirin: Secondary | ICD-10-CM | POA: Diagnosis not present

## 2021-04-27 DIAGNOSIS — F03911 Unspecified dementia, unspecified severity, with agitation: Secondary | ICD-10-CM | POA: Diagnosis not present

## 2021-04-27 DIAGNOSIS — E11649 Type 2 diabetes mellitus with hypoglycemia without coma: Secondary | ICD-10-CM | POA: Diagnosis not present

## 2021-04-27 DIAGNOSIS — I129 Hypertensive chronic kidney disease with stage 1 through stage 4 chronic kidney disease, or unspecified chronic kidney disease: Secondary | ICD-10-CM | POA: Diagnosis not present

## 2021-04-27 DIAGNOSIS — E785 Hyperlipidemia, unspecified: Secondary | ICD-10-CM | POA: Diagnosis not present

## 2021-04-27 DIAGNOSIS — H8110 Benign paroxysmal vertigo, unspecified ear: Secondary | ICD-10-CM | POA: Diagnosis not present

## 2021-04-27 DIAGNOSIS — M5030 Other cervical disc degeneration, unspecified cervical region: Secondary | ICD-10-CM | POA: Diagnosis not present

## 2021-04-27 DIAGNOSIS — Z87891 Personal history of nicotine dependence: Secondary | ICD-10-CM | POA: Diagnosis not present

## 2021-04-30 DIAGNOSIS — E559 Vitamin D deficiency, unspecified: Secondary | ICD-10-CM | POA: Diagnosis not present

## 2021-04-30 DIAGNOSIS — Z79899 Other long term (current) drug therapy: Secondary | ICD-10-CM | POA: Diagnosis not present

## 2021-05-04 DIAGNOSIS — M1711 Unilateral primary osteoarthritis, right knee: Secondary | ICD-10-CM | POA: Diagnosis not present

## 2021-05-04 DIAGNOSIS — F0393 Unspecified dementia, unspecified severity, with mood disturbance: Secondary | ICD-10-CM | POA: Diagnosis not present

## 2021-05-04 DIAGNOSIS — J441 Chronic obstructive pulmonary disease with (acute) exacerbation: Secondary | ICD-10-CM | POA: Diagnosis not present

## 2021-05-04 DIAGNOSIS — E11649 Type 2 diabetes mellitus with hypoglycemia without coma: Secondary | ICD-10-CM | POA: Diagnosis not present

## 2021-05-04 DIAGNOSIS — Z8616 Personal history of COVID-19: Secondary | ICD-10-CM | POA: Diagnosis not present

## 2021-05-04 DIAGNOSIS — H8110 Benign paroxysmal vertigo, unspecified ear: Secondary | ICD-10-CM | POA: Diagnosis not present

## 2021-05-04 DIAGNOSIS — E785 Hyperlipidemia, unspecified: Secondary | ICD-10-CM | POA: Diagnosis not present

## 2021-05-04 DIAGNOSIS — D696 Thrombocytopenia, unspecified: Secondary | ICD-10-CM | POA: Diagnosis not present

## 2021-05-04 DIAGNOSIS — F0394 Unspecified dementia, unspecified severity, with anxiety: Secondary | ICD-10-CM | POA: Diagnosis not present

## 2021-05-04 DIAGNOSIS — F32A Depression, unspecified: Secondary | ICD-10-CM | POA: Diagnosis not present

## 2021-05-04 DIAGNOSIS — N189 Chronic kidney disease, unspecified: Secondary | ICD-10-CM | POA: Diagnosis not present

## 2021-05-04 DIAGNOSIS — F03911 Unspecified dementia, unspecified severity, with agitation: Secondary | ICD-10-CM | POA: Diagnosis not present

## 2021-05-04 DIAGNOSIS — I2510 Atherosclerotic heart disease of native coronary artery without angina pectoris: Secondary | ICD-10-CM | POA: Diagnosis not present

## 2021-05-04 DIAGNOSIS — G4739 Other sleep apnea: Secondary | ICD-10-CM | POA: Diagnosis not present

## 2021-05-04 DIAGNOSIS — Z7982 Long term (current) use of aspirin: Secondary | ICD-10-CM | POA: Diagnosis not present

## 2021-05-04 DIAGNOSIS — E1122 Type 2 diabetes mellitus with diabetic chronic kidney disease: Secondary | ICD-10-CM | POA: Diagnosis not present

## 2021-05-04 DIAGNOSIS — G9341 Metabolic encephalopathy: Secondary | ICD-10-CM | POA: Diagnosis not present

## 2021-05-04 DIAGNOSIS — Z87891 Personal history of nicotine dependence: Secondary | ICD-10-CM | POA: Diagnosis not present

## 2021-05-04 DIAGNOSIS — G2581 Restless legs syndrome: Secondary | ICD-10-CM | POA: Diagnosis not present

## 2021-05-04 DIAGNOSIS — K219 Gastro-esophageal reflux disease without esophagitis: Secondary | ICD-10-CM | POA: Diagnosis not present

## 2021-05-04 DIAGNOSIS — I129 Hypertensive chronic kidney disease with stage 1 through stage 4 chronic kidney disease, or unspecified chronic kidney disease: Secondary | ICD-10-CM | POA: Diagnosis not present

## 2021-05-04 DIAGNOSIS — M5030 Other cervical disc degeneration, unspecified cervical region: Secondary | ICD-10-CM | POA: Diagnosis not present

## 2021-05-05 DIAGNOSIS — Z87891 Personal history of nicotine dependence: Secondary | ICD-10-CM | POA: Diagnosis not present

## 2021-05-05 DIAGNOSIS — E785 Hyperlipidemia, unspecified: Secondary | ICD-10-CM | POA: Diagnosis not present

## 2021-05-05 DIAGNOSIS — I129 Hypertensive chronic kidney disease with stage 1 through stage 4 chronic kidney disease, or unspecified chronic kidney disease: Secondary | ICD-10-CM | POA: Diagnosis not present

## 2021-05-05 DIAGNOSIS — F32A Depression, unspecified: Secondary | ICD-10-CM | POA: Diagnosis not present

## 2021-05-05 DIAGNOSIS — G4739 Other sleep apnea: Secondary | ICD-10-CM | POA: Diagnosis not present

## 2021-05-05 DIAGNOSIS — J441 Chronic obstructive pulmonary disease with (acute) exacerbation: Secondary | ICD-10-CM | POA: Diagnosis not present

## 2021-05-05 DIAGNOSIS — F03911 Unspecified dementia, unspecified severity, with agitation: Secondary | ICD-10-CM | POA: Diagnosis not present

## 2021-05-05 DIAGNOSIS — N189 Chronic kidney disease, unspecified: Secondary | ICD-10-CM | POA: Diagnosis not present

## 2021-05-05 DIAGNOSIS — G2581 Restless legs syndrome: Secondary | ICD-10-CM | POA: Diagnosis not present

## 2021-05-05 DIAGNOSIS — F0394 Unspecified dementia, unspecified severity, with anxiety: Secondary | ICD-10-CM | POA: Diagnosis not present

## 2021-05-05 DIAGNOSIS — K219 Gastro-esophageal reflux disease without esophagitis: Secondary | ICD-10-CM | POA: Diagnosis not present

## 2021-05-05 DIAGNOSIS — F0393 Unspecified dementia, unspecified severity, with mood disturbance: Secondary | ICD-10-CM | POA: Diagnosis not present

## 2021-05-05 DIAGNOSIS — D696 Thrombocytopenia, unspecified: Secondary | ICD-10-CM | POA: Diagnosis not present

## 2021-05-05 DIAGNOSIS — G9341 Metabolic encephalopathy: Secondary | ICD-10-CM | POA: Diagnosis not present

## 2021-05-05 DIAGNOSIS — M5030 Other cervical disc degeneration, unspecified cervical region: Secondary | ICD-10-CM | POA: Diagnosis not present

## 2021-05-05 DIAGNOSIS — I2510 Atherosclerotic heart disease of native coronary artery without angina pectoris: Secondary | ICD-10-CM | POA: Diagnosis not present

## 2021-05-05 DIAGNOSIS — H8110 Benign paroxysmal vertigo, unspecified ear: Secondary | ICD-10-CM | POA: Diagnosis not present

## 2021-05-05 DIAGNOSIS — E11649 Type 2 diabetes mellitus with hypoglycemia without coma: Secondary | ICD-10-CM | POA: Diagnosis not present

## 2021-05-05 DIAGNOSIS — Z7982 Long term (current) use of aspirin: Secondary | ICD-10-CM | POA: Diagnosis not present

## 2021-05-05 DIAGNOSIS — E1122 Type 2 diabetes mellitus with diabetic chronic kidney disease: Secondary | ICD-10-CM | POA: Diagnosis not present

## 2021-05-05 DIAGNOSIS — Z8616 Personal history of COVID-19: Secondary | ICD-10-CM | POA: Diagnosis not present

## 2021-05-05 DIAGNOSIS — M1711 Unilateral primary osteoarthritis, right knee: Secondary | ICD-10-CM | POA: Diagnosis not present

## 2021-05-12 DIAGNOSIS — M1711 Unilateral primary osteoarthritis, right knee: Secondary | ICD-10-CM | POA: Diagnosis not present

## 2021-05-12 DIAGNOSIS — F03911 Unspecified dementia, unspecified severity, with agitation: Secondary | ICD-10-CM | POA: Diagnosis not present

## 2021-05-12 DIAGNOSIS — I129 Hypertensive chronic kidney disease with stage 1 through stage 4 chronic kidney disease, or unspecified chronic kidney disease: Secondary | ICD-10-CM | POA: Diagnosis not present

## 2021-05-12 DIAGNOSIS — G4739 Other sleep apnea: Secondary | ICD-10-CM | POA: Diagnosis not present

## 2021-05-12 DIAGNOSIS — F32A Depression, unspecified: Secondary | ICD-10-CM | POA: Diagnosis not present

## 2021-05-12 DIAGNOSIS — Z7982 Long term (current) use of aspirin: Secondary | ICD-10-CM | POA: Diagnosis not present

## 2021-05-12 DIAGNOSIS — M5030 Other cervical disc degeneration, unspecified cervical region: Secondary | ICD-10-CM | POA: Diagnosis not present

## 2021-05-12 DIAGNOSIS — E11649 Type 2 diabetes mellitus with hypoglycemia without coma: Secondary | ICD-10-CM | POA: Diagnosis not present

## 2021-05-12 DIAGNOSIS — I2510 Atherosclerotic heart disease of native coronary artery without angina pectoris: Secondary | ICD-10-CM | POA: Diagnosis not present

## 2021-05-12 DIAGNOSIS — G2581 Restless legs syndrome: Secondary | ICD-10-CM | POA: Diagnosis not present

## 2021-05-12 DIAGNOSIS — Z87891 Personal history of nicotine dependence: Secondary | ICD-10-CM | POA: Diagnosis not present

## 2021-05-12 DIAGNOSIS — G9341 Metabolic encephalopathy: Secondary | ICD-10-CM | POA: Diagnosis not present

## 2021-05-12 DIAGNOSIS — Z8616 Personal history of COVID-19: Secondary | ICD-10-CM | POA: Diagnosis not present

## 2021-05-12 DIAGNOSIS — D696 Thrombocytopenia, unspecified: Secondary | ICD-10-CM | POA: Diagnosis not present

## 2021-05-12 DIAGNOSIS — E1122 Type 2 diabetes mellitus with diabetic chronic kidney disease: Secondary | ICD-10-CM | POA: Diagnosis not present

## 2021-05-12 DIAGNOSIS — N189 Chronic kidney disease, unspecified: Secondary | ICD-10-CM | POA: Diagnosis not present

## 2021-05-12 DIAGNOSIS — J441 Chronic obstructive pulmonary disease with (acute) exacerbation: Secondary | ICD-10-CM | POA: Diagnosis not present

## 2021-05-12 DIAGNOSIS — F0393 Unspecified dementia, unspecified severity, with mood disturbance: Secondary | ICD-10-CM | POA: Diagnosis not present

## 2021-05-12 DIAGNOSIS — H8110 Benign paroxysmal vertigo, unspecified ear: Secondary | ICD-10-CM | POA: Diagnosis not present

## 2021-05-12 DIAGNOSIS — E785 Hyperlipidemia, unspecified: Secondary | ICD-10-CM | POA: Diagnosis not present

## 2021-05-12 DIAGNOSIS — F0394 Unspecified dementia, unspecified severity, with anxiety: Secondary | ICD-10-CM | POA: Diagnosis not present

## 2021-05-12 DIAGNOSIS — K219 Gastro-esophageal reflux disease without esophagitis: Secondary | ICD-10-CM | POA: Diagnosis not present

## 2021-05-17 DIAGNOSIS — M1711 Unilateral primary osteoarthritis, right knee: Secondary | ICD-10-CM | POA: Diagnosis not present

## 2021-05-17 DIAGNOSIS — I129 Hypertensive chronic kidney disease with stage 1 through stage 4 chronic kidney disease, or unspecified chronic kidney disease: Secondary | ICD-10-CM | POA: Diagnosis not present

## 2021-05-17 DIAGNOSIS — F0393 Unspecified dementia, unspecified severity, with mood disturbance: Secondary | ICD-10-CM | POA: Diagnosis not present

## 2021-05-17 DIAGNOSIS — G4739 Other sleep apnea: Secondary | ICD-10-CM | POA: Diagnosis not present

## 2021-05-17 DIAGNOSIS — D696 Thrombocytopenia, unspecified: Secondary | ICD-10-CM | POA: Diagnosis not present

## 2021-05-17 DIAGNOSIS — K219 Gastro-esophageal reflux disease without esophagitis: Secondary | ICD-10-CM | POA: Diagnosis not present

## 2021-05-17 DIAGNOSIS — E11649 Type 2 diabetes mellitus with hypoglycemia without coma: Secondary | ICD-10-CM | POA: Diagnosis not present

## 2021-05-17 DIAGNOSIS — H8110 Benign paroxysmal vertigo, unspecified ear: Secondary | ICD-10-CM | POA: Diagnosis not present

## 2021-05-17 DIAGNOSIS — F03911 Unspecified dementia, unspecified severity, with agitation: Secondary | ICD-10-CM | POA: Diagnosis not present

## 2021-05-17 DIAGNOSIS — Z7982 Long term (current) use of aspirin: Secondary | ICD-10-CM | POA: Diagnosis not present

## 2021-05-17 DIAGNOSIS — F32A Depression, unspecified: Secondary | ICD-10-CM | POA: Diagnosis not present

## 2021-05-17 DIAGNOSIS — M5030 Other cervical disc degeneration, unspecified cervical region: Secondary | ICD-10-CM | POA: Diagnosis not present

## 2021-05-17 DIAGNOSIS — J441 Chronic obstructive pulmonary disease with (acute) exacerbation: Secondary | ICD-10-CM | POA: Diagnosis not present

## 2021-05-17 DIAGNOSIS — N189 Chronic kidney disease, unspecified: Secondary | ICD-10-CM | POA: Diagnosis not present

## 2021-05-17 DIAGNOSIS — Z87891 Personal history of nicotine dependence: Secondary | ICD-10-CM | POA: Diagnosis not present

## 2021-05-17 DIAGNOSIS — E785 Hyperlipidemia, unspecified: Secondary | ICD-10-CM | POA: Diagnosis not present

## 2021-05-17 DIAGNOSIS — Z8616 Personal history of COVID-19: Secondary | ICD-10-CM | POA: Diagnosis not present

## 2021-05-17 DIAGNOSIS — G2581 Restless legs syndrome: Secondary | ICD-10-CM | POA: Diagnosis not present

## 2021-05-17 DIAGNOSIS — E1122 Type 2 diabetes mellitus with diabetic chronic kidney disease: Secondary | ICD-10-CM | POA: Diagnosis not present

## 2021-05-17 DIAGNOSIS — I2510 Atherosclerotic heart disease of native coronary artery without angina pectoris: Secondary | ICD-10-CM | POA: Diagnosis not present

## 2021-05-17 DIAGNOSIS — F0394 Unspecified dementia, unspecified severity, with anxiety: Secondary | ICD-10-CM | POA: Diagnosis not present

## 2021-05-17 DIAGNOSIS — G9341 Metabolic encephalopathy: Secondary | ICD-10-CM | POA: Diagnosis not present

## 2021-05-18 DIAGNOSIS — D696 Thrombocytopenia, unspecified: Secondary | ICD-10-CM | POA: Diagnosis not present

## 2021-05-18 DIAGNOSIS — G9341 Metabolic encephalopathy: Secondary | ICD-10-CM | POA: Diagnosis not present

## 2021-05-18 DIAGNOSIS — E785 Hyperlipidemia, unspecified: Secondary | ICD-10-CM | POA: Diagnosis not present

## 2021-05-18 DIAGNOSIS — I2510 Atherosclerotic heart disease of native coronary artery without angina pectoris: Secondary | ICD-10-CM | POA: Diagnosis not present

## 2021-05-18 DIAGNOSIS — Z8616 Personal history of COVID-19: Secondary | ICD-10-CM | POA: Diagnosis not present

## 2021-05-18 DIAGNOSIS — F03911 Unspecified dementia, unspecified severity, with agitation: Secondary | ICD-10-CM | POA: Diagnosis not present

## 2021-05-18 DIAGNOSIS — Z7982 Long term (current) use of aspirin: Secondary | ICD-10-CM | POA: Diagnosis not present

## 2021-05-18 DIAGNOSIS — M5030 Other cervical disc degeneration, unspecified cervical region: Secondary | ICD-10-CM | POA: Diagnosis not present

## 2021-05-18 DIAGNOSIS — H8110 Benign paroxysmal vertigo, unspecified ear: Secondary | ICD-10-CM | POA: Diagnosis not present

## 2021-05-18 DIAGNOSIS — G4739 Other sleep apnea: Secondary | ICD-10-CM | POA: Diagnosis not present

## 2021-05-18 DIAGNOSIS — G2581 Restless legs syndrome: Secondary | ICD-10-CM | POA: Diagnosis not present

## 2021-05-18 DIAGNOSIS — F0394 Unspecified dementia, unspecified severity, with anxiety: Secondary | ICD-10-CM | POA: Diagnosis not present

## 2021-05-18 DIAGNOSIS — J441 Chronic obstructive pulmonary disease with (acute) exacerbation: Secondary | ICD-10-CM | POA: Diagnosis not present

## 2021-05-18 DIAGNOSIS — I129 Hypertensive chronic kidney disease with stage 1 through stage 4 chronic kidney disease, or unspecified chronic kidney disease: Secondary | ICD-10-CM | POA: Diagnosis not present

## 2021-05-18 DIAGNOSIS — E1122 Type 2 diabetes mellitus with diabetic chronic kidney disease: Secondary | ICD-10-CM | POA: Diagnosis not present

## 2021-05-18 DIAGNOSIS — E11649 Type 2 diabetes mellitus with hypoglycemia without coma: Secondary | ICD-10-CM | POA: Diagnosis not present

## 2021-05-18 DIAGNOSIS — Z87891 Personal history of nicotine dependence: Secondary | ICD-10-CM | POA: Diagnosis not present

## 2021-05-18 DIAGNOSIS — M1711 Unilateral primary osteoarthritis, right knee: Secondary | ICD-10-CM | POA: Diagnosis not present

## 2021-05-18 DIAGNOSIS — N189 Chronic kidney disease, unspecified: Secondary | ICD-10-CM | POA: Diagnosis not present

## 2021-05-18 DIAGNOSIS — F32A Depression, unspecified: Secondary | ICD-10-CM | POA: Diagnosis not present

## 2021-05-18 DIAGNOSIS — F0393 Unspecified dementia, unspecified severity, with mood disturbance: Secondary | ICD-10-CM | POA: Diagnosis not present

## 2021-05-18 DIAGNOSIS — K219 Gastro-esophageal reflux disease without esophagitis: Secondary | ICD-10-CM | POA: Diagnosis not present

## 2021-05-20 DIAGNOSIS — K219 Gastro-esophageal reflux disease without esophagitis: Secondary | ICD-10-CM | POA: Diagnosis not present

## 2021-05-20 DIAGNOSIS — L219 Seborrheic dermatitis, unspecified: Secondary | ICD-10-CM | POA: Diagnosis not present

## 2021-05-20 DIAGNOSIS — I119 Hypertensive heart disease without heart failure: Secondary | ICD-10-CM | POA: Diagnosis not present

## 2021-05-20 DIAGNOSIS — E871 Hypo-osmolality and hyponatremia: Secondary | ICD-10-CM | POA: Diagnosis not present

## 2021-05-20 DIAGNOSIS — E559 Vitamin D deficiency, unspecified: Secondary | ICD-10-CM | POA: Diagnosis not present

## 2021-05-20 DIAGNOSIS — R748 Abnormal levels of other serum enzymes: Secondary | ICD-10-CM | POA: Diagnosis not present

## 2021-05-20 DIAGNOSIS — E119 Type 2 diabetes mellitus without complications: Secondary | ICD-10-CM | POA: Diagnosis not present

## 2021-05-20 DIAGNOSIS — N289 Disorder of kidney and ureter, unspecified: Secondary | ICD-10-CM | POA: Diagnosis not present

## 2021-05-20 DIAGNOSIS — E782 Mixed hyperlipidemia: Secondary | ICD-10-CM | POA: Diagnosis not present

## 2021-05-28 DIAGNOSIS — E1122 Type 2 diabetes mellitus with diabetic chronic kidney disease: Secondary | ICD-10-CM | POA: Diagnosis not present

## 2021-05-28 DIAGNOSIS — G4739 Other sleep apnea: Secondary | ICD-10-CM | POA: Diagnosis not present

## 2021-05-28 DIAGNOSIS — Z87891 Personal history of nicotine dependence: Secondary | ICD-10-CM | POA: Diagnosis not present

## 2021-05-28 DIAGNOSIS — J441 Chronic obstructive pulmonary disease with (acute) exacerbation: Secondary | ICD-10-CM | POA: Diagnosis not present

## 2021-05-28 DIAGNOSIS — F0394 Unspecified dementia, unspecified severity, with anxiety: Secondary | ICD-10-CM | POA: Diagnosis not present

## 2021-05-28 DIAGNOSIS — F0393 Unspecified dementia, unspecified severity, with mood disturbance: Secondary | ICD-10-CM | POA: Diagnosis not present

## 2021-05-28 DIAGNOSIS — Z8616 Personal history of COVID-19: Secondary | ICD-10-CM | POA: Diagnosis not present

## 2021-05-28 DIAGNOSIS — M1711 Unilateral primary osteoarthritis, right knee: Secondary | ICD-10-CM | POA: Diagnosis not present

## 2021-05-28 DIAGNOSIS — G9341 Metabolic encephalopathy: Secondary | ICD-10-CM | POA: Diagnosis not present

## 2021-05-28 DIAGNOSIS — Z7982 Long term (current) use of aspirin: Secondary | ICD-10-CM | POA: Diagnosis not present

## 2021-05-28 DIAGNOSIS — H8110 Benign paroxysmal vertigo, unspecified ear: Secondary | ICD-10-CM | POA: Diagnosis not present

## 2021-05-28 DIAGNOSIS — D696 Thrombocytopenia, unspecified: Secondary | ICD-10-CM | POA: Diagnosis not present

## 2021-05-28 DIAGNOSIS — G2581 Restless legs syndrome: Secondary | ICD-10-CM | POA: Diagnosis not present

## 2021-05-28 DIAGNOSIS — E785 Hyperlipidemia, unspecified: Secondary | ICD-10-CM | POA: Diagnosis not present

## 2021-05-28 DIAGNOSIS — I2510 Atherosclerotic heart disease of native coronary artery without angina pectoris: Secondary | ICD-10-CM | POA: Diagnosis not present

## 2021-05-28 DIAGNOSIS — M5030 Other cervical disc degeneration, unspecified cervical region: Secondary | ICD-10-CM | POA: Diagnosis not present

## 2021-05-28 DIAGNOSIS — N189 Chronic kidney disease, unspecified: Secondary | ICD-10-CM | POA: Diagnosis not present

## 2021-05-28 DIAGNOSIS — K219 Gastro-esophageal reflux disease without esophagitis: Secondary | ICD-10-CM | POA: Diagnosis not present

## 2021-05-28 DIAGNOSIS — F32A Depression, unspecified: Secondary | ICD-10-CM | POA: Diagnosis not present

## 2021-05-28 DIAGNOSIS — F03911 Unspecified dementia, unspecified severity, with agitation: Secondary | ICD-10-CM | POA: Diagnosis not present

## 2021-05-28 DIAGNOSIS — I129 Hypertensive chronic kidney disease with stage 1 through stage 4 chronic kidney disease, or unspecified chronic kidney disease: Secondary | ICD-10-CM | POA: Diagnosis not present

## 2021-05-28 DIAGNOSIS — E11649 Type 2 diabetes mellitus with hypoglycemia without coma: Secondary | ICD-10-CM | POA: Diagnosis not present

## 2021-06-03 DIAGNOSIS — E785 Hyperlipidemia, unspecified: Secondary | ICD-10-CM | POA: Diagnosis not present

## 2021-06-03 DIAGNOSIS — F32A Depression, unspecified: Secondary | ICD-10-CM | POA: Diagnosis not present

## 2021-06-03 DIAGNOSIS — I129 Hypertensive chronic kidney disease with stage 1 through stage 4 chronic kidney disease, or unspecified chronic kidney disease: Secondary | ICD-10-CM | POA: Diagnosis not present

## 2021-06-03 DIAGNOSIS — G9341 Metabolic encephalopathy: Secondary | ICD-10-CM | POA: Diagnosis not present

## 2021-06-03 DIAGNOSIS — E11649 Type 2 diabetes mellitus with hypoglycemia without coma: Secondary | ICD-10-CM | POA: Diagnosis not present

## 2021-06-03 DIAGNOSIS — K219 Gastro-esophageal reflux disease without esophagitis: Secondary | ICD-10-CM | POA: Diagnosis not present

## 2021-06-03 DIAGNOSIS — Z7982 Long term (current) use of aspirin: Secondary | ICD-10-CM | POA: Diagnosis not present

## 2021-06-03 DIAGNOSIS — F0394 Unspecified dementia, unspecified severity, with anxiety: Secondary | ICD-10-CM | POA: Diagnosis not present

## 2021-06-03 DIAGNOSIS — M1711 Unilateral primary osteoarthritis, right knee: Secondary | ICD-10-CM | POA: Diagnosis not present

## 2021-06-03 DIAGNOSIS — F0393 Unspecified dementia, unspecified severity, with mood disturbance: Secondary | ICD-10-CM | POA: Diagnosis not present

## 2021-06-03 DIAGNOSIS — Z87891 Personal history of nicotine dependence: Secondary | ICD-10-CM | POA: Diagnosis not present

## 2021-06-03 DIAGNOSIS — M5030 Other cervical disc degeneration, unspecified cervical region: Secondary | ICD-10-CM | POA: Diagnosis not present

## 2021-06-03 DIAGNOSIS — I2510 Atherosclerotic heart disease of native coronary artery without angina pectoris: Secondary | ICD-10-CM | POA: Diagnosis not present

## 2021-06-03 DIAGNOSIS — G2581 Restless legs syndrome: Secondary | ICD-10-CM | POA: Diagnosis not present

## 2021-06-03 DIAGNOSIS — E1122 Type 2 diabetes mellitus with diabetic chronic kidney disease: Secondary | ICD-10-CM | POA: Diagnosis not present

## 2021-06-03 DIAGNOSIS — H8110 Benign paroxysmal vertigo, unspecified ear: Secondary | ICD-10-CM | POA: Diagnosis not present

## 2021-06-03 DIAGNOSIS — Z8616 Personal history of COVID-19: Secondary | ICD-10-CM | POA: Diagnosis not present

## 2021-06-03 DIAGNOSIS — N189 Chronic kidney disease, unspecified: Secondary | ICD-10-CM | POA: Diagnosis not present

## 2021-06-03 DIAGNOSIS — J441 Chronic obstructive pulmonary disease with (acute) exacerbation: Secondary | ICD-10-CM | POA: Diagnosis not present

## 2021-06-03 DIAGNOSIS — D696 Thrombocytopenia, unspecified: Secondary | ICD-10-CM | POA: Diagnosis not present

## 2021-06-03 DIAGNOSIS — F03911 Unspecified dementia, unspecified severity, with agitation: Secondary | ICD-10-CM | POA: Diagnosis not present

## 2021-06-03 DIAGNOSIS — G4739 Other sleep apnea: Secondary | ICD-10-CM | POA: Diagnosis not present

## 2021-06-09 DIAGNOSIS — E11649 Type 2 diabetes mellitus with hypoglycemia without coma: Secondary | ICD-10-CM | POA: Diagnosis not present

## 2021-06-09 DIAGNOSIS — F03911 Unspecified dementia, unspecified severity, with agitation: Secondary | ICD-10-CM | POA: Diagnosis not present

## 2021-06-09 DIAGNOSIS — F32A Depression, unspecified: Secondary | ICD-10-CM | POA: Diagnosis not present

## 2021-06-09 DIAGNOSIS — E785 Hyperlipidemia, unspecified: Secondary | ICD-10-CM | POA: Diagnosis not present

## 2021-06-09 DIAGNOSIS — H8110 Benign paroxysmal vertigo, unspecified ear: Secondary | ICD-10-CM | POA: Diagnosis not present

## 2021-06-09 DIAGNOSIS — Z87891 Personal history of nicotine dependence: Secondary | ICD-10-CM | POA: Diagnosis not present

## 2021-06-09 DIAGNOSIS — Z8616 Personal history of COVID-19: Secondary | ICD-10-CM | POA: Diagnosis not present

## 2021-06-09 DIAGNOSIS — Z7982 Long term (current) use of aspirin: Secondary | ICD-10-CM | POA: Diagnosis not present

## 2021-06-09 DIAGNOSIS — M5030 Other cervical disc degeneration, unspecified cervical region: Secondary | ICD-10-CM | POA: Diagnosis not present

## 2021-06-09 DIAGNOSIS — K219 Gastro-esophageal reflux disease without esophagitis: Secondary | ICD-10-CM | POA: Diagnosis not present

## 2021-06-09 DIAGNOSIS — E1122 Type 2 diabetes mellitus with diabetic chronic kidney disease: Secondary | ICD-10-CM | POA: Diagnosis not present

## 2021-06-09 DIAGNOSIS — N189 Chronic kidney disease, unspecified: Secondary | ICD-10-CM | POA: Diagnosis not present

## 2021-06-09 DIAGNOSIS — I129 Hypertensive chronic kidney disease with stage 1 through stage 4 chronic kidney disease, or unspecified chronic kidney disease: Secondary | ICD-10-CM | POA: Diagnosis not present

## 2021-06-09 DIAGNOSIS — J441 Chronic obstructive pulmonary disease with (acute) exacerbation: Secondary | ICD-10-CM | POA: Diagnosis not present

## 2021-06-09 DIAGNOSIS — G2581 Restless legs syndrome: Secondary | ICD-10-CM | POA: Diagnosis not present

## 2021-06-09 DIAGNOSIS — D696 Thrombocytopenia, unspecified: Secondary | ICD-10-CM | POA: Diagnosis not present

## 2021-06-09 DIAGNOSIS — G9341 Metabolic encephalopathy: Secondary | ICD-10-CM | POA: Diagnosis not present

## 2021-06-09 DIAGNOSIS — F0393 Unspecified dementia, unspecified severity, with mood disturbance: Secondary | ICD-10-CM | POA: Diagnosis not present

## 2021-06-09 DIAGNOSIS — F0394 Unspecified dementia, unspecified severity, with anxiety: Secondary | ICD-10-CM | POA: Diagnosis not present

## 2021-06-09 DIAGNOSIS — G4739 Other sleep apnea: Secondary | ICD-10-CM | POA: Diagnosis not present

## 2021-06-09 DIAGNOSIS — I2510 Atherosclerotic heart disease of native coronary artery without angina pectoris: Secondary | ICD-10-CM | POA: Diagnosis not present

## 2021-06-09 DIAGNOSIS — M1711 Unilateral primary osteoarthritis, right knee: Secondary | ICD-10-CM | POA: Diagnosis not present

## 2021-06-15 DIAGNOSIS — J441 Chronic obstructive pulmonary disease with (acute) exacerbation: Secondary | ICD-10-CM | POA: Diagnosis not present

## 2021-06-15 DIAGNOSIS — I129 Hypertensive chronic kidney disease with stage 1 through stage 4 chronic kidney disease, or unspecified chronic kidney disease: Secondary | ICD-10-CM | POA: Diagnosis not present

## 2021-06-15 DIAGNOSIS — E1122 Type 2 diabetes mellitus with diabetic chronic kidney disease: Secondary | ICD-10-CM | POA: Diagnosis not present

## 2021-06-15 DIAGNOSIS — E11649 Type 2 diabetes mellitus with hypoglycemia without coma: Secondary | ICD-10-CM | POA: Diagnosis not present

## 2021-06-15 DIAGNOSIS — N189 Chronic kidney disease, unspecified: Secondary | ICD-10-CM | POA: Diagnosis not present

## 2021-06-15 DIAGNOSIS — F03911 Unspecified dementia, unspecified severity, with agitation: Secondary | ICD-10-CM | POA: Diagnosis not present

## 2021-06-15 DIAGNOSIS — G9341 Metabolic encephalopathy: Secondary | ICD-10-CM | POA: Diagnosis not present

## 2021-06-15 DIAGNOSIS — I2510 Atherosclerotic heart disease of native coronary artery without angina pectoris: Secondary | ICD-10-CM | POA: Diagnosis not present

## 2021-06-15 DIAGNOSIS — M1711 Unilateral primary osteoarthritis, right knee: Secondary | ICD-10-CM | POA: Diagnosis not present

## 2021-06-15 DIAGNOSIS — H8110 Benign paroxysmal vertigo, unspecified ear: Secondary | ICD-10-CM | POA: Diagnosis not present

## 2021-06-15 DIAGNOSIS — F0394 Unspecified dementia, unspecified severity, with anxiety: Secondary | ICD-10-CM | POA: Diagnosis not present

## 2021-06-15 DIAGNOSIS — G4739 Other sleep apnea: Secondary | ICD-10-CM | POA: Diagnosis not present

## 2021-06-15 DIAGNOSIS — D696 Thrombocytopenia, unspecified: Secondary | ICD-10-CM | POA: Diagnosis not present

## 2021-06-15 DIAGNOSIS — F32A Depression, unspecified: Secondary | ICD-10-CM | POA: Diagnosis not present

## 2021-06-15 DIAGNOSIS — M5030 Other cervical disc degeneration, unspecified cervical region: Secondary | ICD-10-CM | POA: Diagnosis not present

## 2021-06-15 DIAGNOSIS — Z87891 Personal history of nicotine dependence: Secondary | ICD-10-CM | POA: Diagnosis not present

## 2021-06-15 DIAGNOSIS — G2581 Restless legs syndrome: Secondary | ICD-10-CM | POA: Diagnosis not present

## 2021-06-15 DIAGNOSIS — Z7982 Long term (current) use of aspirin: Secondary | ICD-10-CM | POA: Diagnosis not present

## 2021-06-15 DIAGNOSIS — K219 Gastro-esophageal reflux disease without esophagitis: Secondary | ICD-10-CM | POA: Diagnosis not present

## 2021-06-15 DIAGNOSIS — E785 Hyperlipidemia, unspecified: Secondary | ICD-10-CM | POA: Diagnosis not present

## 2021-06-15 DIAGNOSIS — Z8616 Personal history of COVID-19: Secondary | ICD-10-CM | POA: Diagnosis not present

## 2021-06-15 DIAGNOSIS — F0393 Unspecified dementia, unspecified severity, with mood disturbance: Secondary | ICD-10-CM | POA: Diagnosis not present

## 2021-06-16 ENCOUNTER — Non-Acute Institutional Stay: Payer: Medicare Other | Admitting: Student

## 2021-06-16 DIAGNOSIS — Z515 Encounter for palliative care: Secondary | ICD-10-CM

## 2021-06-16 DIAGNOSIS — Z7289 Other problems related to lifestyle: Secondary | ICD-10-CM | POA: Diagnosis not present

## 2021-06-16 DIAGNOSIS — R0602 Shortness of breath: Secondary | ICD-10-CM

## 2021-06-16 DIAGNOSIS — F03C18 Unspecified dementia, severe, with other behavioral disturbance: Secondary | ICD-10-CM

## 2021-06-16 NOTE — Progress Notes (Signed)
? ? ?Manufacturing engineer ?Community Palliative Care Consult Note ?Telephone: [phone]  ?Fax: [phone]  ? ? ?Date of encounter: 06/16/21 ?12:48 PM ?PATIENT NAME: Todd Davidson ?[address] ?[place] [address]   ?[phone] (home)  ?DOB: 1936-02-24 ?MRN: [id number] ?PRIMARY CARE PROVIDER:    ?Virgie Dad, NP ? ?REFERRING PROVIDER:   ?Virgie Dad, NP ? ?RESPONSIBLE PARTY:    ?Contact Information   ? ? Name Relation Home Work Mobile  ? Mang, [place] Brother [phone]  [phone]  ? ?  ? ? ? ?I met face to face with patient  in the facility. Palliative Care was asked to follow this patient by consultation request of  Virgie Dad, NP to address advance care planning and complex medical decision making. This is a follow up visit. ? ?                                 ASSESSMENT AND PLAN / RECOMMENDATIONS:  ? ?Advance Care Planning/Goals of Care: Goals include to maximize quality of life and symptom management. Patient/health care surrogate gave his/her permission to discuss. ?Our advance care planning conversation included a discussion about:    ?The value and importance of advance care planning  ?Experiences with loved ones who have been seriously ill or have died  ?Exploration of personal, cultural or spiritual beliefs that might influence medical decisions  ?Exploration of goals of care in the event of a sudden injury or illness  ?CODE STATUS: DNR ? ?Symptom Management/Plan: ? ?Dementia-patient has moved to dementia unit. Staff to reorient and redirect as needed. Assist with adl's as needed. Use walker for ambulation. Monitor for further cognitive/functional declines.  ? ?Inappropriate sexual behaviors-patient was recently changed from citalopram to fluoxetine. Fluoxetine dose increased on 06/01/21. He is also receiving gabapentin. Continue medications as directed. Will discuss with PCP. Patient is to be followed by psychiatry.  ? ?Dyspnea-secondary to asthma, COPD. Occasional  shortness of breath with exertion. Continue inhaler, nebulizer as directed. ? ?Follow up Palliative Care Visit: Palliative care will continue to follow for complex medical decision making, advance care planning, and clarification of goals. Return in 6-8 weeks or prn. ? ? ?This visit was coded based on medical decision making (MDM). ? ?PPS: 40% ? ?HOSPICE ELIGIBILITY/DIAGNOSIS: TBD ? ?Chief Complaint: Palliative Medicine follow up visit.  ? ?HISTORY OF PRESENT ILLNESS:  Todd Davidson is a 86 y.o. year old male  with dementia, CAD, left renal mass, prostate cancer, asthma, COPD, hypertension.  ? ?Patient resides at [facility] on dementia unit. Staff reports patient adjusting; he will still wanders about unit. Propels self in w/c; also ambulates short distances. He requires assistance with adl's. He denies pain, endorses shortness of breath with exertion. He is forgetful, requires frequent reminders/cueing. Staff report patient exhibiting some inappropriate sexual behaviors. No falls reported. Appetite has been good. No sleep difficulty reported. ? ?Patient received sitting in common area; he is working on a puzzle. He is cooperative, answers questions.  ? ?History obtained from review of EMR, discussion with primary team, and interview with family, facility staff/caregiver and/or Mr. Binegar.  ?I reviewed available labs, medications, imaging, studies and related documents from the EMR.  Records reviewed and summarized above.  ? ?Physical Exam: ?Pulse 72, resp 16, b/p 130/72, sats 97% on room air ?Constitutional: NAD ?General: frail appearing ?EYES: anicteric sclera, lids intact, no discharge  ?ENMT: intact hearing, oral mucous membranes  moist, dentition intact ?CV: S1S2, RRR, no LE edema ?Pulmonary: upper lobes clear, bases diminished, no increased work of breathing, no cough, room air ?Abdomen:  normo-active BS + 4 quadrants, soft and non tender, no ascites ?GU: deferred ?MSK: in w/c ?Skin: warm and  dry, no rashes or wounds on visible skin ?Neuro:  no generalized weakness, A & O x 2, forgetful, tremor ?Psych: non-anxious affect, pleasant ?Hem/lymph/immuno: no widespread bruising ? ? ?Thank you for the opportunity to participate in the care of Mr. Sikorski.  The palliative care team will continue to follow. Please call our office at [phone] if we can be of additional assistance.  ? ?Ezekiel Slocumb, NP  ? ?COVID-19 PATIENT SCREENING TOOL ?Asked and negative response unless otherwise noted:  ? ?Have you had symptoms of covid, tested positive or been in contact with someone with symptoms/positive test in the past 5-10 days? No ? ?

## 2021-06-17 DIAGNOSIS — J449 Chronic obstructive pulmonary disease, unspecified: Secondary | ICD-10-CM | POA: Diagnosis not present

## 2021-06-17 DIAGNOSIS — G301 Alzheimer's disease with late onset: Secondary | ICD-10-CM | POA: Diagnosis not present

## 2021-06-17 DIAGNOSIS — L219 Seborrheic dermatitis, unspecified: Secondary | ICD-10-CM | POA: Diagnosis not present

## 2021-06-17 DIAGNOSIS — F02C0 Dementia in other diseases classified elsewhere, severe, without behavioral disturbance, psychotic disturbance, mood disturbance, and anxiety: Secondary | ICD-10-CM | POA: Diagnosis not present

## 2021-06-17 DIAGNOSIS — K219 Gastro-esophageal reflux disease without esophagitis: Secondary | ICD-10-CM | POA: Diagnosis not present

## 2021-06-21 DIAGNOSIS — M25531 Pain in right wrist: Secondary | ICD-10-CM | POA: Diagnosis not present

## 2021-06-22 DIAGNOSIS — G2581 Restless legs syndrome: Secondary | ICD-10-CM | POA: Diagnosis not present

## 2021-06-22 DIAGNOSIS — M1711 Unilateral primary osteoarthritis, right knee: Secondary | ICD-10-CM | POA: Diagnosis not present

## 2021-06-22 DIAGNOSIS — Z7982 Long term (current) use of aspirin: Secondary | ICD-10-CM | POA: Diagnosis not present

## 2021-06-22 DIAGNOSIS — H8110 Benign paroxysmal vertigo, unspecified ear: Secondary | ICD-10-CM | POA: Diagnosis not present

## 2021-06-22 DIAGNOSIS — I2510 Atherosclerotic heart disease of native coronary artery without angina pectoris: Secondary | ICD-10-CM | POA: Diagnosis not present

## 2021-06-22 DIAGNOSIS — M5030 Other cervical disc degeneration, unspecified cervical region: Secondary | ICD-10-CM | POA: Diagnosis not present

## 2021-06-22 DIAGNOSIS — E785 Hyperlipidemia, unspecified: Secondary | ICD-10-CM | POA: Diagnosis not present

## 2021-06-22 DIAGNOSIS — E1122 Type 2 diabetes mellitus with diabetic chronic kidney disease: Secondary | ICD-10-CM | POA: Diagnosis not present

## 2021-06-22 DIAGNOSIS — K219 Gastro-esophageal reflux disease without esophagitis: Secondary | ICD-10-CM | POA: Diagnosis not present

## 2021-06-22 DIAGNOSIS — Z8616 Personal history of COVID-19: Secondary | ICD-10-CM | POA: Diagnosis not present

## 2021-06-22 DIAGNOSIS — I129 Hypertensive chronic kidney disease with stage 1 through stage 4 chronic kidney disease, or unspecified chronic kidney disease: Secondary | ICD-10-CM | POA: Diagnosis not present

## 2021-06-22 DIAGNOSIS — N189 Chronic kidney disease, unspecified: Secondary | ICD-10-CM | POA: Diagnosis not present

## 2021-06-22 DIAGNOSIS — G4739 Other sleep apnea: Secondary | ICD-10-CM | POA: Diagnosis not present

## 2021-06-22 DIAGNOSIS — F03911 Unspecified dementia, unspecified severity, with agitation: Secondary | ICD-10-CM | POA: Diagnosis not present

## 2021-06-22 DIAGNOSIS — E11649 Type 2 diabetes mellitus with hypoglycemia without coma: Secondary | ICD-10-CM | POA: Diagnosis not present

## 2021-06-22 DIAGNOSIS — F0393 Unspecified dementia, unspecified severity, with mood disturbance: Secondary | ICD-10-CM | POA: Diagnosis not present

## 2021-06-22 DIAGNOSIS — Z87891 Personal history of nicotine dependence: Secondary | ICD-10-CM | POA: Diagnosis not present

## 2021-06-22 DIAGNOSIS — J441 Chronic obstructive pulmonary disease with (acute) exacerbation: Secondary | ICD-10-CM | POA: Diagnosis not present

## 2021-06-22 DIAGNOSIS — D696 Thrombocytopenia, unspecified: Secondary | ICD-10-CM | POA: Diagnosis not present

## 2021-06-22 DIAGNOSIS — F32A Depression, unspecified: Secondary | ICD-10-CM | POA: Diagnosis not present

## 2021-06-22 DIAGNOSIS — G9341 Metabolic encephalopathy: Secondary | ICD-10-CM | POA: Diagnosis not present

## 2021-06-22 DIAGNOSIS — F0394 Unspecified dementia, unspecified severity, with anxiety: Secondary | ICD-10-CM | POA: Diagnosis not present

## 2021-06-24 DIAGNOSIS — M25531 Pain in right wrist: Secondary | ICD-10-CM | POA: Diagnosis not present

## 2021-06-24 DIAGNOSIS — Z6820 Body mass index (BMI) 20.0-20.9, adult: Secondary | ICD-10-CM | POA: Diagnosis not present

## 2021-06-24 DIAGNOSIS — M85831 Other specified disorders of bone density and structure, right forearm: Secondary | ICD-10-CM | POA: Diagnosis not present

## 2021-06-24 DIAGNOSIS — W050XXA Fall from non-moving wheelchair, initial encounter: Secondary | ICD-10-CM | POA: Diagnosis not present

## 2021-06-24 DIAGNOSIS — F02C0 Dementia in other diseases classified elsewhere, severe, without behavioral disturbance, psychotic disturbance, mood disturbance, and anxiety: Secondary | ICD-10-CM | POA: Diagnosis not present

## 2021-06-24 DIAGNOSIS — G301 Alzheimer's disease with late onset: Secondary | ICD-10-CM | POA: Diagnosis not present

## 2021-07-01 DIAGNOSIS — R251 Tremor, unspecified: Secondary | ICD-10-CM | POA: Diagnosis not present

## 2021-07-01 DIAGNOSIS — G301 Alzheimer's disease with late onset: Secondary | ICD-10-CM | POA: Diagnosis not present

## 2021-07-01 DIAGNOSIS — F02C18 Dementia in other diseases classified elsewhere, severe, with other behavioral disturbance: Secondary | ICD-10-CM | POA: Diagnosis not present

## 2021-07-01 DIAGNOSIS — G894 Chronic pain syndrome: Secondary | ICD-10-CM | POA: Diagnosis not present

## 2021-07-15 DIAGNOSIS — F02C0 Dementia in other diseases classified elsewhere, severe, without behavioral disturbance, psychotic disturbance, mood disturbance, and anxiety: Secondary | ICD-10-CM | POA: Diagnosis not present

## 2021-07-15 DIAGNOSIS — G301 Alzheimer's disease with late onset: Secondary | ICD-10-CM | POA: Diagnosis not present

## 2021-07-15 DIAGNOSIS — R251 Tremor, unspecified: Secondary | ICD-10-CM | POA: Diagnosis not present

## 2021-07-15 DIAGNOSIS — I119 Hypertensive heart disease without heart failure: Secondary | ICD-10-CM | POA: Diagnosis not present

## 2021-07-15 DIAGNOSIS — I2510 Atherosclerotic heart disease of native coronary artery without angina pectoris: Secondary | ICD-10-CM | POA: Diagnosis not present

## 2021-07-20 ENCOUNTER — Non-Acute Institutional Stay: Payer: Medicare Other | Admitting: Student

## 2021-07-20 DIAGNOSIS — F03B18 Unspecified dementia, moderate, with other behavioral disturbance: Secondary | ICD-10-CM | POA: Diagnosis not present

## 2021-07-20 DIAGNOSIS — Z7289 Other problems related to lifestyle: Secondary | ICD-10-CM

## 2021-07-20 DIAGNOSIS — R0602 Shortness of breath: Secondary | ICD-10-CM

## 2021-07-20 DIAGNOSIS — Z515 Encounter for palliative care: Secondary | ICD-10-CM

## 2021-07-20 NOTE — Progress Notes (Signed)
? ? ?Manufacturing engineer ?Community Palliative Care Consult Note ?Telephone: [phone]  ?Fax: [phone]  ? ? ?Date of encounter: 07/20/21 ? ?PATIENT NAME: Todd Davidson ?[address] ?[place] [address]   ?[phone] (home)  ?DOB: Feb 08, 1936 ?MRN: [id number] ?PRIMARY CARE PROVIDER:    ?Virgie Dad, NP ? ?REFERRING PROVIDER:   ?Virgie Dad, NP ? ?RESPONSIBLE PARTY:    ?Contact Information   ? ? Name Relation Home Work Mobile  ? [place], [place] Brother [phone]  [phone]  ? ?  ? ? ? ?I met face to face with patient in the facility. Palliative Care was asked to follow this patient by consultation request of Virgie Dad, NP  to address advance care planning and complex medical decision making. This is a follow up visit. ? ?                                 ASSESSMENT AND PLAN / RECOMMENDATIONS:  ? ?Advance Care Planning/Goals of Care: Goals include to maximize quality of life and symptom management. Patient/health care surrogate gave his/her permission to discuss. ?CODE STATUS: DNR ? ? ?Symptom Management/Plan: ? ?Dementia-patient resides on dementia unit. Staff to continue reorienting, redirecting as needed. Assist with adl's. Patient ambulates short distances, uses w/c most for locomotion and pedals self about unit. Monitor for falls/safety.  ? ?Inappropriate sexual behaviors-staff reports improvement in behaviors. He was started on medroxyprogesterone; continues on fluoxetine 20 mg daily. Patient is followed by psychiatry.  ? ?Dyspnea-secondary to asthma, COPD. reports breathing has been stable; reports occasional shortness of breath with exertion. Continue inhaler, nebulizer as directed ? ?Follow up Palliative Care Visit: Palliative care will continue to follow for complex medical decision making, advance care planning, and clarification of goals. Return in 8 weeks or prn. ? ? ?This visit was coded based on medical decision making (MDM). ? ?PPS: 40% ? ?HOSPICE  ELIGIBILITY/DIAGNOSIS: TBD ? ?Chief Complaint: Palliative Medicine follow up visit.  ? ?HISTORY OF PRESENT ILLNESS:  Todd Davidson is a 86 y.o. year old male  with dementia, CAD, left renal mass, prostate cancer, asthma, COPD, hypertension.   ? ?Patient resides at [facility] on dementia unit. Patient denies pain, nausea, constipation. Endorses occasional shortness of breath with exertion. Staff report inappropriate sexual behaviors have improved. His appetite is good. He is sleeping well at night. A 10-point ROS is negative, except for the pertinent positives and negatives detailed per the HPI.  ? ? ?History obtained from review of EMR, discussion with primary team, and interview with family, facility staff/caregiver and/or Todd Davidson.  ?I reviewed available labs, medications, imaging, studies and related documents from the EMR.  Records reviewed and summarized above.  ? ?Physical Exam: ?Pulse 72, resp 20, b/p 140/80, sats 95% on room air ?Constitutional: NAD ?General: frail appearing ?EYES: anicteric sclera, lids intact, no discharge  ?ENMT: intact hearing, oral mucous membranes moist, dentition intact ?CV: S1S2, RRR, no LE edema ?Pulmonary: LCTA, no increased work of breathing, no cough, room air ?Abdomen: normo-active BS + 4 quadrants, soft and non tender, no ascites ?GU: deferred ?MSK: moves all extremities, ambulatory ?Skin: warm and dry, no rashes or wounds on visible skin ?Neuro:  no generalized weakness, A & O x 2, forgetful ?Psych: non-anxious affect, pleasant, cooperative ?Hem/lymph/immuno: no widespread bruising ? ? ?Thank you for the opportunity to participate in the care of Todd Davidson.  The palliative care team will continue  to follow. Please call our office at [phone] if we can be of additional assistance.  ? ?Ezekiel Slocumb, NP  ? ?COVID-19 PATIENT SCREENING TOOL ?Asked and negative response unless otherwise noted:  ? ?Have you had symptoms of covid, tested positive or been in  contact with someone with symptoms/positive test in the past 5-10 days? No ? ?

## 2021-07-29 DIAGNOSIS — G301 Alzheimer's disease with late onset: Secondary | ICD-10-CM | POA: Diagnosis not present

## 2021-07-29 DIAGNOSIS — G894 Chronic pain syndrome: Secondary | ICD-10-CM | POA: Diagnosis not present

## 2021-07-29 DIAGNOSIS — F02C18 Dementia in other diseases classified elsewhere, severe, with other behavioral disturbance: Secondary | ICD-10-CM | POA: Diagnosis not present

## 2021-08-19 DIAGNOSIS — Z8673 Personal history of transient ischemic attack (TIA), and cerebral infarction without residual deficits: Secondary | ICD-10-CM | POA: Diagnosis not present

## 2021-08-19 DIAGNOSIS — G629 Polyneuropathy, unspecified: Secondary | ICD-10-CM | POA: Diagnosis not present

## 2021-08-19 DIAGNOSIS — G4700 Insomnia, unspecified: Secondary | ICD-10-CM | POA: Diagnosis not present

## 2021-08-19 DIAGNOSIS — F03B18 Unspecified dementia, moderate, with other behavioral disturbance: Secondary | ICD-10-CM | POA: Diagnosis not present

## 2021-08-19 DIAGNOSIS — R251 Tremor, unspecified: Secondary | ICD-10-CM | POA: Diagnosis not present

## 2021-08-20 DIAGNOSIS — R300 Dysuria: Secondary | ICD-10-CM | POA: Diagnosis not present

## 2021-08-24 DIAGNOSIS — G250 Essential tremor: Secondary | ICD-10-CM | POA: Diagnosis not present

## 2021-08-24 DIAGNOSIS — I10 Essential (primary) hypertension: Secondary | ICD-10-CM | POA: Diagnosis not present

## 2021-08-26 DIAGNOSIS — G894 Chronic pain syndrome: Secondary | ICD-10-CM | POA: Diagnosis not present

## 2021-08-26 DIAGNOSIS — R451 Restlessness and agitation: Secondary | ICD-10-CM | POA: Diagnosis not present

## 2021-08-26 DIAGNOSIS — F02C18 Dementia in other diseases classified elsewhere, severe, with other behavioral disturbance: Secondary | ICD-10-CM | POA: Diagnosis not present

## 2021-08-26 DIAGNOSIS — G301 Alzheimer's disease with late onset: Secondary | ICD-10-CM | POA: Diagnosis not present

## 2021-09-02 DIAGNOSIS — E119 Type 2 diabetes mellitus without complications: Secondary | ICD-10-CM | POA: Diagnosis not present

## 2021-09-02 DIAGNOSIS — Z79899 Other long term (current) drug therapy: Secondary | ICD-10-CM | POA: Diagnosis not present

## 2021-09-02 DIAGNOSIS — F02C0 Dementia in other diseases classified elsewhere, severe, without behavioral disturbance, psychotic disturbance, mood disturbance, and anxiety: Secondary | ICD-10-CM | POA: Diagnosis not present

## 2021-09-07 ENCOUNTER — Other Ambulatory Visit: Payer: Self-pay | Admitting: Interventional Radiology

## 2021-09-07 DIAGNOSIS — N2889 Other specified disorders of kidney and ureter: Secondary | ICD-10-CM

## 2021-09-09 DIAGNOSIS — N1831 Chronic kidney disease, stage 3a: Secondary | ICD-10-CM | POA: Diagnosis not present

## 2021-09-09 DIAGNOSIS — I129 Hypertensive chronic kidney disease with stage 1 through stage 4 chronic kidney disease, or unspecified chronic kidney disease: Secondary | ICD-10-CM | POA: Diagnosis not present

## 2021-09-09 DIAGNOSIS — E1122 Type 2 diabetes mellitus with diabetic chronic kidney disease: Secondary | ICD-10-CM | POA: Diagnosis not present

## 2021-09-09 DIAGNOSIS — E871 Hypo-osmolality and hyponatremia: Secondary | ICD-10-CM | POA: Diagnosis not present

## 2021-09-09 DIAGNOSIS — R451 Restlessness and agitation: Secondary | ICD-10-CM | POA: Diagnosis not present

## 2021-09-23 DIAGNOSIS — F02C18 Dementia in other diseases classified elsewhere, severe, with other behavioral disturbance: Secondary | ICD-10-CM | POA: Diagnosis not present

## 2021-09-23 DIAGNOSIS — G301 Alzheimer's disease with late onset: Secondary | ICD-10-CM | POA: Diagnosis not present

## 2021-09-23 DIAGNOSIS — G894 Chronic pain syndrome: Secondary | ICD-10-CM | POA: Diagnosis not present

## 2021-09-27 ENCOUNTER — Ambulatory Visit
Admission: RE | Admit: 2021-09-27 | Discharge: 2021-09-27 | Disposition: A | Discharge status: Home / Self Care | Payer: Medicare Other | Source: Ambulatory Visit | Attending: Interventional Radiology | Admitting: Interventional Radiology

## 2021-09-27 DIAGNOSIS — N2889 Other specified disorders of kidney and ureter: Secondary | ICD-10-CM | POA: Insufficient documentation

## 2021-09-27 DIAGNOSIS — N281 Cyst of kidney, acquired: Secondary | ICD-10-CM | POA: Diagnosis not present

## 2021-09-27 DIAGNOSIS — C642 Malignant neoplasm of left kidney, except renal pelvis: Secondary | ICD-10-CM | POA: Diagnosis not present

## 2021-09-27 DIAGNOSIS — M4856XA Collapsed vertebra, not elsewhere classified, lumbar region, initial encounter for fracture: Secondary | ICD-10-CM | POA: Diagnosis not present

## 2021-09-27 DIAGNOSIS — N200 Calculus of kidney: Secondary | ICD-10-CM | POA: Diagnosis not present

## 2021-09-27 LAB — POCT I-STAT CREATININE: Creatinine, Ser: 0.9 mg/dL (ref 0.61–1.24)

## 2021-09-27 MED ORDER — IOHEXOL 300 MG/ML  SOLN
100.0000 mL | Freq: Once | INTRAMUSCULAR | Status: AC | PRN
  Administered 2021-09-27: 100 mL via INTRAVENOUS

## 2021-10-01 ENCOUNTER — Encounter: Payer: Self-pay | Admitting: *Deleted

## 2021-10-01 ENCOUNTER — Ambulatory Visit
Admission: RE | Admit: 2021-10-01 | Discharge: 2021-10-01 | Disposition: A | Discharge status: Home / Self Care | Payer: Medicare Other | Source: Ambulatory Visit | Attending: Interventional Radiology | Admitting: Interventional Radiology

## 2021-10-01 DIAGNOSIS — Z9889 Other specified postprocedural states: Secondary | ICD-10-CM | POA: Diagnosis not present

## 2021-10-01 DIAGNOSIS — N2889 Other specified disorders of kidney and ureter: Secondary | ICD-10-CM

## 2021-10-01 HISTORY — PX: IR RADIOLOGIST EVAL & MGMT: IMG5224

## 2021-10-01 NOTE — Progress Notes (Signed)
Patient ID: Todd Davidson, male   DOB: 06-02-1935, 86 y.o.   MRN: [id number]       Chief Complaint:  Left renal cell carcinoma  Referring Physician(s):  Stoioff  History of Present Illness: Todd Davidson is a 86 y.o. male who is now over 2 years status post left upper pole renal cell carcinoma biopsy and cryoablation performed at [facility].  Biopsy did confirm renal cell carcinoma.  He has advanced progressive Alzheimer's dementia and is in a memory care unit as well as additional comorbidities including COPD, hypertension, diabetes and CHF.  He is also followed closely by urology for prostate cancer.  Physically he remains very limited related to the dementia and a fused right knee.  His brother is his power of attorney who I spoke with today again by telephone.  In review his most recent imaging from 09/27/2021 redemonstrates a stable enhancing nodule along the deep medial margin of the ablation site measuring 10 to 12 mm compatible with residual recurrent tumor along the medial ablation margin.  No significant interval change.  No adenopathy.  Stable bilateral renal cysts.  No new or acute renal process.  Past Medical History:  Diagnosis Date   Arthritis    neck, shoulders, hips, knees   Asthma    as child, out grew   Barrett esophagus    BPH (benign prostatic hyperplasia)    Coronary artery disease    annual stress with Dr. Humphrey Rolls. no new findings   Coronary artery disease    Dementia ([place])    Depression    Diabetes mellitus without complication ([place])    no medications currently was on insulin but was dropping blood sugars too low causing syncope   Diverticulosis    Duodenitis    Gastritis    GERD (gastroesophageal reflux disease)    History of COVID-19 02/2019   History of kidney stones    Hypercholesteremia    Hypertension    Left renal mass    Leg fracture, right    wears brace   Neuromuscular disorder (HCC)    neuropathy - feet   Occasional tremors     hands   Prostate cancer ([place])    Renal insufficiency    Sleep apnea    doesn't wear CPAP, pt denies   Wears dentures    full upper    Past Surgical History:  Procedure Laterality Date   [place], 2000   stents placed   CATARACT EXTRACTION W/PHACO Left 10/29/2014   Procedure: CATARACT EXTRACTION PHACO AND INTRAOCULAR LENS PLACEMENT ([place]);  Surgeon: Leandrew Koyanagi, MD;  Location: [place];  Service: Ophthalmology;  Laterality: Left;  DIABETIC - insulin MALYUGIN   CERVICAL FUSION     CORONARY ANGIOPLASTY     ESOPHAGOGASTRODUODENOSCOPY N/A 12/29/2017   Procedure: ESOPHAGOGASTRODUODENOSCOPY (EGD);  Surgeon: [name], MD;  Location: [facility] END[facility]COPY;  Service: Endoscopy;  Laterality: N/A;   ESOPHAGOGASTRODUODENOSCOPY (EGD) WITH PROPOFOL N/A 12/17/2015   Procedure: ESOPHAGOGASTRODUODENOSCOPY (EGD) WITH PROPOFOL;  Surgeon: [name], MD;  Location: [facility] ENDOSCOPY;  Service: Endoscopy;  Laterality: N/A;   EYE SURGERY Right    HERNIA REPAIR     x2   I & D EXTREMITY Right 10/31/2018   Procedure: IRRIGATION AND DEBRIDEMENT RIGHT KNEE  EXTERNAL FIXATION OF FRACTURE;  Surgeon: Shona Needles, MD;  Location: [place];  Service: Orthopedics;  Laterality: Right;  IRRIGATION AND DEBRIDEMENT RIGHT KNEE  EXTERNAL FIXATION OF  FRACTURE   IR RADIOLOGIST EVAL & MGMT  02/28/2019   IR RADIOLOGIST EVAL & MGMT  04/30/2019   IR RADIOLOGIST EVAL & MGMT  07/02/2019   IR RADIOLOGIST EVAL & MGMT  10/10/2019   IR RADIOLOGIST EVAL & MGMT  06/25/2020   IR RADIOLOGIST EVAL & MGMT  10/14/2020   IR RADIOLOGIST EVAL & MGMT  03/23/2021   IR RADIOLOGIST EVAL & MGMT  04/22/2021   IRRIGATION AND DEBRIDEMENT KNEE Right 10/31/2018   JOINT REPLACEMENT     knee   KNEE FUSION     1973    KNEE SURGERY Right    joint removal with fusion and graft   NISSEN FUNDOPLICATION     RADIOFREQUENCY ABLATION Left 06/05/2019   Procedure: LEFT RENAL  CRYO ABLATION;  Surgeon:  Greggory Keen, MD;  Location: [facility];  Service: Anesthesiology;  Laterality: Left;    Allergies: Macrolides and ketolides, Erythromycin, Propranolol, Sulfa antibiotics, Sulfa antibiotics, Sulfasalazine, Tape, Tapentadol, Tapentadol, Telbivudine, and Tape  Medications: Prior to Admission medications   Medication Sig Start Date End Date Taking? Authorizing Provider  acetaminophen (TYLENOL) 325 MG tablet Take 2 tablets (650 mg total) by mouth every 6 (six) hours as needed for mild pain. 11/05/18   Norm Parcel, PA-C  amLODipine (NORVASC) 10 MG tablet Take 10 mg by mouth daily. 09/04/18   [provider]  aspirin 81 MG chewable tablet Chew 81 mg by mouth daily. AM    [provider]  benazepril (LOTENSIN) 40 MG tablet Take 40 mg by mouth at bedtime.  09/04/18   [provider]  citalopram (CELEXA) 10 MG tablet Take 10 mg by mouth at bedtime.  10/30/18   [provider]  clotrimazole (LOTRIMIN) 1 % cream Apply 1 application topically 2 (two) times daily. 08/01/19   Arta Silence, MD  Fluticasone-Umeclidin-Vilant (TRELEGY ELLIPTA) 100-62.5-25 MCG/INH AEPB Inhale 1 puff into the lungs daily. 07/28/20   Lavera Guise, MD  gabapentin (NEURONTIN) 300 MG capsule Take 300 mg by mouth 2 (two) times daily.  09/27/18   [provider]  guaiFENesin (MUCINEX) 600 MG 12 hr tablet Take 600 mg by mouth 2 (two) times daily.    [provider]  hydrocortisone (ANUSOL-HC) 2.5 % rectal cream Apply 1 application topically daily as needed. 01/31/20   [provider]  ipratropium-albuterol (DUONEB) 0.5-2.5 (3) MG/3ML SOLN Take 3 mLs by nebulization.    [provider]  Nutritional Supplements (FEEDING SUPPLEMENT, NEPRO CARB STEADY,) LIQD Take 237 mLs by mouth 3 (three) times daily between meals. 01/17/21   Max Sane, MD  omeprazole (PRILOSEC) 40 MG capsule Take 40 mg by mouth daily.     [provider]  QUEtiapine (SEROQUEL) 25 MG  tablet Take 1 tablet (25 mg total) by mouth at bedtime. 01/07/21   Sharen Hones, MD  rosuvastatin (CRESTOR) 40 MG tablet Take 40 mg by mouth daily. 02/02/21   [provider]     Family History  Problem Relation Age of Onset   Prostate cancer Father    Heart disease Brother    Stroke Mother    Breast cancer Sister     Social History   Socioeconomic History   Marital status: Widowed    Spouse name: Not on file   Number of children: Not on file   Years of education: Not on file   Highest education level: Not on file  Occupational History   Not on file  Tobacco Use   Smoking status:  Former    Packs/day: 1.00    Years: 20.00    Total pack years: 20.00    Types: Cigarettes, Pipe, Cigars    Quit date: 03/21/1973    Years since quitting: 48.5   Smokeless tobacco: Current    Types: Snuff   Tobacco comments:    SMOKELESS TOBACCO  Vaping Use   Vaping Use: Never used  Substance and Sexual Activity   Alcohol use: Yes    Alcohol/week: 25.0 standard drinks of alcohol    Types: 25 Cans of beer per week    Comment: 4-6 a day   Drug use: Never   Sexual activity: Not Currently  Other Topics Concern   Not on file  Social History Narrative   ** Merged History Encounter **       Social Determinants of Health   Financial Resource Strain: Not on file  Food Insecurity: Not on file  Transportation Needs: Not on file  Physical Activity: Not on file  Stress: Not on file  Social Connections: Not on file       Review of Systems  Review of Systems: A 12 point ROS discussed and pertinent positives are indicated in the HPI above.  All other systems are negative.      Physical Exam No direct physical exam was performed   Telephone health visit only today with his brother to discuss the surveillance imaging. Vital Signs: There were no vitals taken for this visit.  Imaging: CT ABDOMEN W WO CONTRAST  Result Date: 09/28/2021 CLINICAL DATA:  Follow-up left renal cell  carcinoma. Status post cryoablation. * Tracking Code: BO * EXAM: CT ABDOMEN WITHOUT AND WITH CONTRAST TECHNIQUE: Multidetector CT imaging of the abdomen was performed following the standard protocol before and following the bolus administration of intravenous contrast. RADIATION DOSE REDUCTION: This exam was performed according to the departmental dose-optimization program which includes automated exposure control, adjustment of the mA and/or kV according to patient size and/or use of iterative reconstruction technique. CONTRAST:  135m OMNIPAQUE IOHEXOL 300 MG/ML  SOLN COMPARISON:  03/12/2021 FINDINGS: Lower chest: No acute findings. Hepatobiliary: No hepatic masses identified. Gallbladder is unremarkable. No evidence of biliary ductal dilatation. Pancreas:  No mass or inflammatory changes. Spleen:  Within normal limits in size and appearance. Adrenals/Urinary Tract: A few tiny approximately 2 mm renal calculi are seen bilaterally. No evidence of hydronephrosis. Stable bilateral benign Bosniak category 1 and 2 renal cysts are again seen. Cryoablation defect is again seen in the medial upper pole the left kidney. A central solid nodule showing arterial phase hyperenhancement is seen measuring 10 x 9 mm on image 41/2, without significant change since previous study. This is consistent with residual or recurrent carcinoma at this site. No other renal masses identified. Stomach/Bowel: Unremarkable. Vascular/Lymphatic: No pathologically enlarged lymph nodes identified. No acute vascular findings. Aortic atherosclerotic calcification incidentally noted. Other:  None. Musculoskeletal: No suspicious bone lesions identified. Old L1 and L4 vertebral body compression fractures again noted. IMPRESSION: No significant change in central 10 mm central solid nodule within left upper pole renal ablation defect, consistent with residual or recurrent carcinoma. No evidence of abdominal metastatic disease. Tiny nonobstructing  bilateral renal calculi. No evidence of hydronephrosis. Aortic Atherosclerosis (ICD10-I70.0). Electronically Signed   By: JMarlaine HindM.D.   On: 09/28/2021 13:24    Labs:  CBC: Recent Labs    01/01/21 0723 01/02/21 0451 01/03/21 0528 01/07/21 0515  WBC 10.7* 10.3 8.8 5.8  HGB 12.5* 14.5 14.5 15.7  HCT  36.4* 41.0 42.4 45.6  PLT 109* 129* 126* 125*    COAGS: No results for input(s): "INR", "APTT" in the last 8760 hours.  BMP: Recent Labs    01/01/21 0723 01/02/21 0451 01/03/21 0528 01/04/21 0743 01/07/21 0515 01/14/21 0658 03/12/21 0911 09/27/21 1150  NA 138 142 142 144  --   --   --   --   K 3.3* 3.5 3.2* 3.6  --   --   --   --   CL 104 104 103 105  --   --   --   --   CO2 '26 28 28 23  '$ --   --   --   --   GLUCOSE 169* 120* 138* 145*  --   --   --   --   BUN '10 13 14 14  '$ --   --   --   --   CALCIUM 8.5* 9.0 9.0 9.1  --   --   --   --   CREATININE 0.89 0.93 0.87 0.94 1.03 0.70 1.00 0.90  GFRNONAA >60 >60 >60 >60 >60 >60  --   --     LIVER FUNCTION TESTS: Recent Labs    12/31/20 0903  BILITOT 0.8  AST 33  ALT 16  ALKPHOS 74  PROT 7.5  ALBUMIN 4.0      Assessment and Plan:  2-1/2 years status post left renal cell carcinoma cryoablation.  Surveillance imaging demonstrates a stable medial deep margin enhancing nodule at the ablation defect measuring 10 to 12 mm.  This is consistent with residual recurrent tumor by CT.  Again, based on the patient's comorbidities and progressive dementia as well as the small residual tumor size it is unlikely that this will develop into a more aggressive malignant process or metastatic disease.  He is likely to pass from other comorbidities.  After discussion with his brother who is power of attorney he was still like to continue with a conservative approach and surveillance imaging.     Plan: Agree with continued annual CT surveillance imaging.  Next CT scan will be July 2024.  This will be scheduled at [facility].    Electronically Signed: Greggory Keen 10/01/2021, 10:16 AM   I spent a total of    40 Minutes in remote  clinical consultation, greater than 50% of which was counseling/coordinating care for this patient with left renal cell carcinoma status post ablation.    Visit type: Audio only (telephone). Audio (no video) only due to patient's lack of internet/smartphone capability. Alternative for in-person consultation at [facility], [place] [address], [place], [place]. This visit type was conducted due to national recommendations for restrictions regarding the COVID-19 Pandemic (e.g. social distancing).  This format is felt to be most appropriate for this patient at this time.  All issues noted in this document were discussed and addressed.

## 2021-10-07 DIAGNOSIS — J449 Chronic obstructive pulmonary disease, unspecified: Secondary | ICD-10-CM | POA: Diagnosis not present

## 2021-10-07 DIAGNOSIS — E1122 Type 2 diabetes mellitus with diabetic chronic kidney disease: Secondary | ICD-10-CM | POA: Diagnosis not present

## 2021-10-07 DIAGNOSIS — M8588 Other specified disorders of bone density and structure, other site: Secondary | ICD-10-CM | POA: Diagnosis not present

## 2021-10-07 DIAGNOSIS — K219 Gastro-esophageal reflux disease without esophagitis: Secondary | ICD-10-CM | POA: Diagnosis not present

## 2021-10-07 DIAGNOSIS — R062 Wheezing: Secondary | ICD-10-CM | POA: Diagnosis not present

## 2021-10-21 DIAGNOSIS — G894 Chronic pain syndrome: Secondary | ICD-10-CM | POA: Diagnosis not present

## 2021-10-21 DIAGNOSIS — F02C18 Dementia in other diseases classified elsewhere, severe, with other behavioral disturbance: Secondary | ICD-10-CM | POA: Diagnosis not present

## 2021-10-21 DIAGNOSIS — R140 Abdominal distension (gaseous): Secondary | ICD-10-CM | POA: Diagnosis not present

## 2021-10-21 DIAGNOSIS — G301 Alzheimer's disease with late onset: Secondary | ICD-10-CM | POA: Diagnosis not present

## 2021-10-21 DIAGNOSIS — R197 Diarrhea, unspecified: Secondary | ICD-10-CM | POA: Diagnosis not present

## 2021-11-04 DIAGNOSIS — I129 Hypertensive chronic kidney disease with stage 1 through stage 4 chronic kidney disease, or unspecified chronic kidney disease: Secondary | ICD-10-CM | POA: Diagnosis not present

## 2021-11-04 DIAGNOSIS — J309 Allergic rhinitis, unspecified: Secondary | ICD-10-CM | POA: Diagnosis not present

## 2021-11-04 DIAGNOSIS — N1831 Chronic kidney disease, stage 3a: Secondary | ICD-10-CM | POA: Diagnosis not present

## 2021-11-04 DIAGNOSIS — I2510 Atherosclerotic heart disease of native coronary artery without angina pectoris: Secondary | ICD-10-CM | POA: Diagnosis not present

## 2021-11-04 DIAGNOSIS — R197 Diarrhea, unspecified: Secondary | ICD-10-CM | POA: Diagnosis not present

## 2021-11-08 DIAGNOSIS — I129 Hypertensive chronic kidney disease with stage 1 through stage 4 chronic kidney disease, or unspecified chronic kidney disease: Secondary | ICD-10-CM | POA: Diagnosis not present

## 2021-11-08 DIAGNOSIS — E1122 Type 2 diabetes mellitus with diabetic chronic kidney disease: Secondary | ICD-10-CM | POA: Diagnosis not present

## 2021-11-08 DIAGNOSIS — G301 Alzheimer's disease with late onset: Secondary | ICD-10-CM | POA: Diagnosis not present

## 2021-11-08 DIAGNOSIS — N1831 Chronic kidney disease, stage 3a: Secondary | ICD-10-CM | POA: Diagnosis not present

## 2021-11-11 DIAGNOSIS — R451 Restlessness and agitation: Secondary | ICD-10-CM | POA: Diagnosis not present

## 2021-11-15 DIAGNOSIS — R300 Dysuria: Secondary | ICD-10-CM | POA: Diagnosis not present

## 2021-11-18 DIAGNOSIS — F02C18 Dementia in other diseases classified elsewhere, severe, with other behavioral disturbance: Secondary | ICD-10-CM | POA: Diagnosis not present

## 2021-11-18 DIAGNOSIS — G301 Alzheimer's disease with late onset: Secondary | ICD-10-CM | POA: Diagnosis not present

## 2021-11-18 DIAGNOSIS — Z79899 Other long term (current) drug therapy: Secondary | ICD-10-CM | POA: Diagnosis not present

## 2021-11-18 DIAGNOSIS — G894 Chronic pain syndrome: Secondary | ICD-10-CM | POA: Diagnosis not present

## 2021-11-18 DIAGNOSIS — E119 Type 2 diabetes mellitus without complications: Secondary | ICD-10-CM | POA: Diagnosis not present

## 2021-11-18 DIAGNOSIS — F02C0 Dementia in other diseases classified elsewhere, severe, without behavioral disturbance, psychotic disturbance, mood disturbance, and anxiety: Secondary | ICD-10-CM | POA: Diagnosis not present

## 2021-12-02 DIAGNOSIS — R269 Unspecified abnormalities of gait and mobility: Secondary | ICD-10-CM | POA: Diagnosis not present

## 2021-12-02 DIAGNOSIS — E1122 Type 2 diabetes mellitus with diabetic chronic kidney disease: Secondary | ICD-10-CM | POA: Diagnosis not present

## 2021-12-02 DIAGNOSIS — G250 Essential tremor: Secondary | ICD-10-CM | POA: Diagnosis not present

## 2021-12-02 DIAGNOSIS — F02C18 Dementia in other diseases classified elsewhere, severe, with other behavioral disturbance: Secondary | ICD-10-CM | POA: Diagnosis not present

## 2021-12-02 DIAGNOSIS — R21 Rash and other nonspecific skin eruption: Secondary | ICD-10-CM | POA: Diagnosis not present

## 2021-12-02 DIAGNOSIS — G301 Alzheimer's disease with late onset: Secondary | ICD-10-CM | POA: Diagnosis not present

## 2021-12-02 DIAGNOSIS — R197 Diarrhea, unspecified: Secondary | ICD-10-CM | POA: Diagnosis not present

## 2021-12-02 DIAGNOSIS — N1831 Chronic kidney disease, stage 3a: Secondary | ICD-10-CM | POA: Diagnosis not present

## 2021-12-08 ENCOUNTER — Non-Acute Institutional Stay: Payer: Medicare Other | Admitting: Student

## 2021-12-08 DIAGNOSIS — G479 Sleep disorder, unspecified: Secondary | ICD-10-CM

## 2021-12-08 DIAGNOSIS — N2889 Other specified disorders of kidney and ureter: Secondary | ICD-10-CM | POA: Diagnosis not present

## 2021-12-08 DIAGNOSIS — F0390 Unspecified dementia without behavioral disturbance: Secondary | ICD-10-CM

## 2021-12-08 DIAGNOSIS — Z515 Encounter for palliative care: Secondary | ICD-10-CM

## 2021-12-08 NOTE — Progress Notes (Signed)
[facility] Palliative Care Consult Note Telephone: [phone]  Fax: [phone]    Date of encounter: 12/08/21  PATIENT NAME: Todd Davidson [id number]   [id number] (home)  DOB: 02/10/1936 MRN: [id number] PRIMARY CARE PROVIDER:    Virgie Dad, NP-[facility]  REFERRING PROVIDER:   Perrin Maltese, MD [place],  [place] [address] [phone]  RESPONSIBLE PARTY:    Contact Information     Name Relation Home Work [place] Brother [phone]  [phone]        I met face to face with patient in the facility. Palliative Care was asked to follow this patient by consultation request of  Virgie Dad, NP to address advance care planning and complex medical decision making. This is a follow up visit.                                   ASSESSMENT AND PLAN / RECOMMENDATIONS:   Advance Care Planning/Goals of Care: Goals include to maximize quality of life and symptom management. Patient/health care surrogate gave his/her permission to discuss. Our advance care planning conversation included a discussion about:    The value and importance of advance care planning  Experiences with loved ones who have been seriously ill or have died  Exploration of personal, cultural or spiritual beliefs that might influence medical decisions  Exploration of goals of care in the event of a sudden injury or illness  CODE STATUS: DNR  Palliative Medicine will continue to provide supportive care, symptom management. Will monitor for further changes/declines.   Symptom Management/Plan:  Dementia with behavioral disturbance-patient resides at [facility] on Dementia unit. Increased forgetfulness. He is requiring more assistance with adl's. His tremors are worsening. Staff to continue assisting with adl's, monitor for falls/safety. Redirect/reorient at needed. Patient with agitation  at times; psychiatry manages medications.    Left renal cell carcinoma-Patient to continue with annual CT surveillance imaging.   Sleep difficulty-new order for rozerem; awaiting medication. Monitor for effectiveness.   Follow up Palliative Care Visit: Palliative care will continue to follow for complex medical decision making, advance care planning, and clarification of goals. Return in 8-10 weeks or prn.  This visit was coded based on medical decision making (MDM).  PPS: 40%  HOSPICE ELIGIBILITY/DIAGNOSIS: TBD  Chief Complaint: Palliative Medicine follow up visit.   HISTORY OF PRESENT ILLNESS:  Todd Davidson is a 86 y.o. year old male  with dementia, CAD, left renal mass, prostate cancer, asthma, COPD, hypertension.     Patient resides at [facility] on dementia unit. Patient states his pain has been managed. Staff states he has been yelling out more regarding needing assistance with adl's. He self propels w/c about unit. His tremors are worsening. He reports not sleeping; staff report new order for rozerem and medication to be started. Good appetite; weight has been stable. Patient able to answer direct questions; forgetfulness noted. Staff contribute to HPI and ROS due to patient's dementia.   History obtained from review of EMR, discussion with primary team, and interview with family, facility staff/caregiver and/or Mr. Pharris.  I reviewed available labs, medications, imaging, studies and related documents from the EMR.  Records reviewed and summarized above.    Physical Exam: Pulse 76, resp 16, b/p 124/88, sats 95% on room air Constitutional: NAD General: frail appearing EYES:  anicteric sclera, lids intact, no discharge  ENMT: intact hearing, oral mucous membranes moist, dentition intact CV: S1S2, RRR, no LE edema Pulmonary: LCTA,  bases diminished, no increased work of breathing, no cough, room air Abdomen: normo-active BS + 4 quadrants, soft and non tender GU:  deferred MSK: moves all extremities, right knee fusion Skin: warm and dry, no rashes or wounds on visible skin Neuro: + generalized weakness, A & O x 2, forgetful Psych: non-anxious affect, cooperative Hem/lymph/immuno: no widespread bruising   Thank you for the opportunity to participate in the care of Todd Davidson.  The palliative care team will continue to follow. Please call our office at [phone] if we can be of additional assistance.   Ezekiel Slocumb, NP   COVID-19 PATIENT SCREENING TOOL Asked and negative response unless otherwise noted:   Have you had symptoms of covid, tested positive or been in contact with someone with symptoms/positive test in the past 5-10 days? No

## 2021-12-09 DIAGNOSIS — R21 Rash and other nonspecific skin eruption: Secondary | ICD-10-CM | POA: Diagnosis not present

## 2021-12-09 DIAGNOSIS — R062 Wheezing: Secondary | ICD-10-CM | POA: Diagnosis not present

## 2021-12-09 DIAGNOSIS — M25512 Pain in left shoulder: Secondary | ICD-10-CM | POA: Diagnosis not present

## 2021-12-09 DIAGNOSIS — G301 Alzheimer's disease with late onset: Secondary | ICD-10-CM | POA: Diagnosis not present

## 2021-12-09 DIAGNOSIS — F02C18 Dementia in other diseases classified elsewhere, severe, with other behavioral disturbance: Secondary | ICD-10-CM | POA: Diagnosis not present

## 2021-12-09 DIAGNOSIS — G894 Chronic pain syndrome: Secondary | ICD-10-CM | POA: Diagnosis not present

## 2021-12-10 DIAGNOSIS — G4701 Insomnia due to medical condition: Secondary | ICD-10-CM | POA: Diagnosis not present

## 2021-12-10 DIAGNOSIS — F02C18 Dementia in other diseases classified elsewhere, severe, with other behavioral disturbance: Secondary | ICD-10-CM | POA: Diagnosis not present

## 2021-12-10 DIAGNOSIS — R269 Unspecified abnormalities of gait and mobility: Secondary | ICD-10-CM | POA: Diagnosis not present

## 2021-12-10 DIAGNOSIS — G301 Alzheimer's disease with late onset: Secondary | ICD-10-CM | POA: Diagnosis not present

## 2021-12-13 DIAGNOSIS — Z79899 Other long term (current) drug therapy: Secondary | ICD-10-CM | POA: Diagnosis not present

## 2021-12-13 DIAGNOSIS — J449 Chronic obstructive pulmonary disease, unspecified: Secondary | ICD-10-CM | POA: Diagnosis not present

## 2021-12-13 DIAGNOSIS — G250 Essential tremor: Secondary | ICD-10-CM | POA: Diagnosis not present

## 2021-12-13 DIAGNOSIS — G309 Alzheimer's disease, unspecified: Secondary | ICD-10-CM | POA: Diagnosis not present

## 2021-12-13 DIAGNOSIS — F32A Depression, unspecified: Secondary | ICD-10-CM | POA: Diagnosis not present

## 2021-12-13 DIAGNOSIS — Z85528 Personal history of other malignant neoplasm of kidney: Secondary | ICD-10-CM | POA: Diagnosis not present

## 2021-12-13 DIAGNOSIS — M8580 Other specified disorders of bone density and structure, unspecified site: Secondary | ICD-10-CM | POA: Diagnosis not present

## 2021-12-13 DIAGNOSIS — K219 Gastro-esophageal reflux disease without esophagitis: Secondary | ICD-10-CM | POA: Diagnosis not present

## 2021-12-13 DIAGNOSIS — K649 Unspecified hemorrhoids: Secondary | ICD-10-CM | POA: Diagnosis not present

## 2021-12-13 DIAGNOSIS — Z7982 Long term (current) use of aspirin: Secondary | ICD-10-CM | POA: Diagnosis not present

## 2021-12-13 DIAGNOSIS — Z9181 History of falling: Secondary | ICD-10-CM | POA: Diagnosis not present

## 2021-12-13 DIAGNOSIS — F02C3 Dementia in other diseases classified elsewhere, severe, with mood disturbance: Secondary | ICD-10-CM | POA: Diagnosis not present

## 2021-12-13 DIAGNOSIS — N189 Chronic kidney disease, unspecified: Secondary | ICD-10-CM | POA: Diagnosis not present

## 2021-12-13 DIAGNOSIS — E1122 Type 2 diabetes mellitus with diabetic chronic kidney disease: Secondary | ICD-10-CM | POA: Diagnosis not present

## 2021-12-15 DIAGNOSIS — F02C3 Dementia in other diseases classified elsewhere, severe, with mood disturbance: Secondary | ICD-10-CM | POA: Diagnosis not present

## 2021-12-15 DIAGNOSIS — Z7982 Long term (current) use of aspirin: Secondary | ICD-10-CM | POA: Diagnosis not present

## 2021-12-15 DIAGNOSIS — K649 Unspecified hemorrhoids: Secondary | ICD-10-CM | POA: Diagnosis not present

## 2021-12-15 DIAGNOSIS — F32A Depression, unspecified: Secondary | ICD-10-CM | POA: Diagnosis not present

## 2021-12-15 DIAGNOSIS — G309 Alzheimer's disease, unspecified: Secondary | ICD-10-CM | POA: Diagnosis not present

## 2021-12-15 DIAGNOSIS — N189 Chronic kidney disease, unspecified: Secondary | ICD-10-CM | POA: Diagnosis not present

## 2021-12-15 DIAGNOSIS — M8580 Other specified disorders of bone density and structure, unspecified site: Secondary | ICD-10-CM | POA: Diagnosis not present

## 2021-12-15 DIAGNOSIS — G250 Essential tremor: Secondary | ICD-10-CM | POA: Diagnosis not present

## 2021-12-15 DIAGNOSIS — Z9181 History of falling: Secondary | ICD-10-CM | POA: Diagnosis not present

## 2021-12-15 DIAGNOSIS — Z79899 Other long term (current) drug therapy: Secondary | ICD-10-CM | POA: Diagnosis not present

## 2021-12-15 DIAGNOSIS — K219 Gastro-esophageal reflux disease without esophagitis: Secondary | ICD-10-CM | POA: Diagnosis not present

## 2021-12-15 DIAGNOSIS — E1122 Type 2 diabetes mellitus with diabetic chronic kidney disease: Secondary | ICD-10-CM | POA: Diagnosis not present

## 2021-12-15 DIAGNOSIS — J449 Chronic obstructive pulmonary disease, unspecified: Secondary | ICD-10-CM | POA: Diagnosis not present

## 2021-12-15 DIAGNOSIS — Z85528 Personal history of other malignant neoplasm of kidney: Secondary | ICD-10-CM | POA: Diagnosis not present

## 2021-12-16 DIAGNOSIS — Z79899 Other long term (current) drug therapy: Secondary | ICD-10-CM | POA: Diagnosis not present

## 2021-12-16 DIAGNOSIS — R1310 Dysphagia, unspecified: Secondary | ICD-10-CM | POA: Diagnosis not present

## 2021-12-16 DIAGNOSIS — R21 Rash and other nonspecific skin eruption: Secondary | ICD-10-CM | POA: Diagnosis not present

## 2021-12-16 DIAGNOSIS — K089 Disorder of teeth and supporting structures, unspecified: Secondary | ICD-10-CM | POA: Diagnosis not present

## 2021-12-16 DIAGNOSIS — Z66 Do not resuscitate: Secondary | ICD-10-CM | POA: Diagnosis not present

## 2021-12-16 DIAGNOSIS — G250 Essential tremor: Secondary | ICD-10-CM | POA: Diagnosis not present

## 2021-12-16 DIAGNOSIS — R451 Restlessness and agitation: Secondary | ICD-10-CM | POA: Diagnosis not present

## 2021-12-19 ENCOUNTER — Emergency Department
Admission: EM | Admit: 2021-12-19 | Discharge: 2021-12-19 | Disposition: A | Discharge status: Home / Self Care | Payer: Medicare Other | Attending: Emergency Medicine | Admitting: Emergency Medicine

## 2021-12-19 ENCOUNTER — Other Ambulatory Visit: Payer: Self-pay

## 2021-12-19 ENCOUNTER — Encounter: Payer: Self-pay | Admitting: Emergency Medicine

## 2021-12-19 ENCOUNTER — Emergency Department: Payer: Medicare Other

## 2021-12-19 DIAGNOSIS — Z743 Need for continuous supervision: Secondary | ICD-10-CM | POA: Diagnosis not present

## 2021-12-19 DIAGNOSIS — R001 Bradycardia, unspecified: Secondary | ICD-10-CM | POA: Diagnosis not present

## 2021-12-19 DIAGNOSIS — F0390 Unspecified dementia without behavioral disturbance: Secondary | ICD-10-CM | POA: Insufficient documentation

## 2021-12-19 DIAGNOSIS — R4182 Altered mental status, unspecified: Secondary | ICD-10-CM

## 2021-12-19 DIAGNOSIS — R404 Transient alteration of awareness: Secondary | ICD-10-CM | POA: Diagnosis not present

## 2021-12-19 DIAGNOSIS — I499 Cardiac arrhythmia, unspecified: Secondary | ICD-10-CM | POA: Diagnosis not present

## 2021-12-19 DIAGNOSIS — R0602 Shortness of breath: Secondary | ICD-10-CM | POA: Diagnosis not present

## 2021-12-19 DIAGNOSIS — R5383 Other fatigue: Secondary | ICD-10-CM | POA: Diagnosis not present

## 2021-12-19 DIAGNOSIS — Z20822 Contact with and (suspected) exposure to covid-19: Secondary | ICD-10-CM | POA: Insufficient documentation

## 2021-12-19 LAB — TROPONIN I (HIGH SENSITIVITY)
Troponin I (High Sensitivity): 5 ng/L (ref <18)
Troponin I (High Sensitivity): 4 ng/L (ref <18)

## 2021-12-19 LAB — COMPREHENSIVE METABOLIC PANEL
ALT: 15 U/L (ref 0–44)
AST: 20 U/L (ref 15–41)
Albumin: 3.4 g/dL — ABNORMAL LOW (ref 3.5–5.0)
Alkaline Phosphatase: 64 U/L (ref 38–126)
Anion gap: 4 — ABNORMAL LOW (ref 5–15)
BUN: 10 mg/dL (ref 8–23)
CO2: 29 mmol/L (ref 22–32)
Calcium: 8.8 mg/dL — ABNORMAL LOW (ref 8.9–10.3)
Chloride: 105 mmol/L (ref 98–111)
Creatinine, Ser: 0.8 mg/dL (ref 0.61–1.24)
GFR, Estimated: >60 mL/min (ref >60)
Glucose, Bld: 89 mg/dL (ref 70–99)
Potassium: 4 mmol/L (ref 3.5–5.1)
Sodium: 138 mmol/L (ref 135–145)
Total Bilirubin: 0.6 mg/dL (ref 0.3–1.2)
Total Protein: 6.1 g/dL — ABNORMAL LOW (ref 6.5–8.1)

## 2021-12-19 LAB — URINALYSIS, ROUTINE W REFLEX MICROSCOPIC
Bacteria, UA: NONE SEEN
Bilirubin Urine: NEGATIVE
Glucose, UA: NEGATIVE mg/dL
Hgb urine dipstick: NEGATIVE
Ketones, ur: NEGATIVE mg/dL
Leukocytes,Ua: NEGATIVE
Nitrite: NEGATIVE
Protein, ur: 100 mg/dL — AB
Specific Gravity, Urine: 1.014 (ref 1.005–1.030)
Squamous Epithelial / HPF: NONE SEEN (ref 0–5)
pH: 5 (ref 5.0–8.0)

## 2021-12-19 LAB — CBC WITH DIFFERENTIAL/PLATELET
Abs Immature Granulocytes: 0.01 10*3/uL (ref 0.00–0.07)
Basophils Absolute: 0.1 10*3/uL (ref 0.0–0.1)
Basophils Relative: 1 %
Eosinophils Absolute: 0.4 10*3/uL (ref 0.0–0.5)
Eosinophils Relative: 7 %
HCT: 38.6 % — ABNORMAL LOW (ref 39.0–52.0)
Hemoglobin: 13 g/dL (ref 13.0–17.0)
Immature Granulocytes: 0 %
Lymphocytes Relative: 30 %
Lymphs Abs: 2 10*3/uL (ref 0.7–4.0)
MCH: 30 pg (ref 26.0–34.0)
MCHC: 33.7 g/dL (ref 30.0–36.0)
MCV: 89.1 fL (ref 80.0–100.0)
Monocytes Absolute: 0.5 10*3/uL (ref 0.1–1.0)
Monocytes Relative: 8 %
Neutro Abs: 3.6 10*3/uL (ref 1.7–7.7)
Neutrophils Relative %: 54 %
Platelets: 154 10*3/uL (ref 150–400)
RBC: 4.33 MIL/uL (ref 4.22–5.81)
RDW: 13.2 % (ref 11.5–15.5)
WBC: 6.6 10*3/uL (ref 4.0–10.5)
nRBC: 0 % (ref 0.0–0.2)

## 2021-12-19 LAB — RESP PANEL BY RT-PCR (FLU A&B, COVID) ARPGX2
Influenza A by PCR: NEGATIVE
Influenza B by PCR: NEGATIVE
SARS Coronavirus 2 by RT PCR: NEGATIVE

## 2021-12-19 NOTE — ED Notes (Signed)
Dr. Joni Fears at bedside for reevaluation at this time. Pt's brother at bedside for update.

## 2021-12-19 NOTE — Discharge Instructions (Signed)
Your CT scan of the head and lab tests were all unremarkable today.  Vital signs were reassuring as well.  Were not able to find an exact cause for the symptoms earlier today, but your evaluation is reassuring.  Continue taking your medications as usual and follow-up with your primary care doctor this week for continued evaluation of your symptoms.

## 2021-12-19 NOTE — ED Triage Notes (Signed)
Pt via EMS from [facility]. Staff called for "unresponsive" per staff pt was lethargic and less responsive than normal. Pt was recently started on abx for a possible PNA. Pt does not have any cough, congestion, fever, or any SOB. On arrival, pt is alert and responsive to voice. EMS reports VSS with bradycardia. Pt is A&Ox4 and NAD

## 2021-12-19 NOTE — ED Notes (Signed)
Previous RN called receiving facility to give report to RN.  This RN gave report to EMS transport.  All questions asked were answered.

## 2021-12-19 NOTE — ED Provider Notes (Signed)
[facility] Provider Note   Event Date/Time   First MD Initiated Contact with Patient 12/19/21 1334     (approximate) History  Altered Mental Status  HPI Todd Davidson is a 86 y.o. male with a past medical history of dementia and right upper extremity tremor who presents via EMS after an episode of unresponsiveness at his nursing home today.  This episode resolved spontaneously and patient never lost pulses or stopped breathing.  Patient is minimally responsive at baseline and normally only states 1 word or phrase.  EMS state that he would say yes or no when answering questions and that this is his baseline.  EMS also noticed patient having bradycardia with occasional heart rates of high 40s.  Further history unable to be obtained due to patient's mental status ROS: Unable to be obtained secondary to patient's mental status   Physical Exam  Triage Vital Signs: ED Triage Vitals  Enc Vitals Group     BP      Pulse      Resp      Temp      Temp src      SpO2      Weight      Height      Head Circumference      Peak Flow      Pain Score      Pain Loc      Pain Edu?      Excl. in [place]?    Most recent vital signs: Vitals:   12/19/21 1335 12/19/21 1500  BP: (!) 143/54 (!) 141/58  Pulse: 60 (!) 59  Resp: 18 12  Temp: 98.6 F (37 C)   SpO2: 97% 97%   General: Awake, cooperative CV:  Good peripheral perfusion.  Resp:  Normal effort.  Abd:  No distention.  Other:  Elderly Caucasian male sitting upright on stretcher in no acute distress.  Minimally responsive ED Results / Procedures / Treatments  Labs (all labs ordered are listed, but only abnormal results are displayed) Labs Reviewed  COMPREHENSIVE METABOLIC PANEL - Abnormal; Notable for the following components:      Result Value   Calcium 8.8 (*)    Total Protein 6.1 (*)    Albumin 3.4 (*)    Anion gap 4 (*)    All other components within normal limits  CBC WITH DIFFERENTIAL/PLATELET -  Abnormal; Notable for the following components:   HCT 38.6 (*)    All other components within normal limits  URINALYSIS, ROUTINE W REFLEX MICROSCOPIC - Abnormal; Notable for the following components:   Color, Urine YELLOW (*)    APPearance CLEAR (*)    Protein, ur 100 (*)    All other components within normal limits  RESP PANEL BY RT-PCR (FLU A&B, COVID) [id number]  TROPONIN I (HIGH SENSITIVITY)  TROPONIN I (HIGH SENSITIVITY)   EKG ED ECG REPORT I, Naaman Plummer, the attending physician, personally viewed and interpreted this ECG. Date: 12/19/2021 EKG Time: 1333 Rate: 80 Rhythm: normal sinus rhythm QRS Axis: normal Intervals: normal ST/T Wave abnormalities: normal Narrative Interpretation: no evidence of acute ischemia RADIOLOGY ED MD interpretation: One-view portable chest x-ray interpreted by me shows no evidence of acute abnormalities including no pneumonia, pneumothorax, or widened mediastinum -Agree with radiology assessment Official radiology report(s): DG Chest Port 1 View  Result Date: 12/19/2021 CLINICAL DATA:  Short of breath. Patient found less responsive and lethargic. EXAM: PORTABLE CHEST 1 VIEW COMPARISON:  02/10/2021 and older  studies. FINDINGS: Cardiac silhouette is normal in size. No mediastinal or hilar masses. Clear lungs.  No pleural effusion or pneumothorax. Skeletal structures are grossly intact. IMPRESSION: No active disease. Electronically Signed   By: Lajean Manes M.D.   On: 12/19/2021 15:34   PROCEDURES: Critical Care performed: No .1-3 Lead EKG Interpretation  Performed by: Naaman Plummer, MD Authorized by: Naaman Plummer, MD     Interpretation: normal     ECG rate:  62   ECG rate assessment: normal     Rhythm: sinus rhythm     Ectopy: none     Conduction: normal    MEDICATIONS ORDERED IN ED: Medications - No data to display IMPRESSION / MDM / [place] / ED COURSE  I reviewed the triage vital signs and the nursing notes.                              Differential diagnosis includes, but is not limited to, metabolic cephalopathy, urinary tract infection, CVA, arrhythmia The patient is on the cardiac monitor to evaluate for evidence of arrhythmia and/or significant heart rate changes. Patient's presentation is most consistent with acute presentation with potential threat to life or bodily function. The patient suffered an episode of altered mental status, but there is no overt concern for a dangerous emergent cause such as, but not limited to, CNS infection, severe Toxidrome, severe metabolic derangement, or stroke.  Care of this patient will be signed out to the oncoming physician at the end of my shift.  All pertinent patient information conveyed and all questions answered.  All further care and disposition decisions will be made by the oncoming physician.     FINAL CLINICAL IMPRESSION(S) / ED DIAGNOSES   Final diagnoses:  Altered mental status, unspecified altered mental status type   Rx / DC Orders   ED Discharge Orders     None      Note:  This document was prepared using Dragon voice recognition software and may include unintentional dictation errors.   Naaman Plummer, MD 12/19/21 [phone]

## 2021-12-20 DIAGNOSIS — F32A Depression, unspecified: Secondary | ICD-10-CM | POA: Diagnosis not present

## 2021-12-20 DIAGNOSIS — G250 Essential tremor: Secondary | ICD-10-CM | POA: Diagnosis not present

## 2021-12-20 DIAGNOSIS — Z79899 Other long term (current) drug therapy: Secondary | ICD-10-CM | POA: Diagnosis not present

## 2021-12-20 DIAGNOSIS — M8580 Other specified disorders of bone density and structure, unspecified site: Secondary | ICD-10-CM | POA: Diagnosis not present

## 2021-12-20 DIAGNOSIS — Z85528 Personal history of other malignant neoplasm of kidney: Secondary | ICD-10-CM | POA: Diagnosis not present

## 2021-12-20 DIAGNOSIS — N189 Chronic kidney disease, unspecified: Secondary | ICD-10-CM | POA: Diagnosis not present

## 2021-12-20 DIAGNOSIS — F02C3 Dementia in other diseases classified elsewhere, severe, with mood disturbance: Secondary | ICD-10-CM | POA: Diagnosis not present

## 2021-12-20 DIAGNOSIS — K219 Gastro-esophageal reflux disease without esophagitis: Secondary | ICD-10-CM | POA: Diagnosis not present

## 2021-12-20 DIAGNOSIS — Z9181 History of falling: Secondary | ICD-10-CM | POA: Diagnosis not present

## 2021-12-20 DIAGNOSIS — K649 Unspecified hemorrhoids: Secondary | ICD-10-CM | POA: Diagnosis not present

## 2021-12-20 DIAGNOSIS — J449 Chronic obstructive pulmonary disease, unspecified: Secondary | ICD-10-CM | POA: Diagnosis not present

## 2021-12-20 DIAGNOSIS — G309 Alzheimer's disease, unspecified: Secondary | ICD-10-CM | POA: Diagnosis not present

## 2021-12-20 DIAGNOSIS — E1122 Type 2 diabetes mellitus with diabetic chronic kidney disease: Secondary | ICD-10-CM | POA: Diagnosis not present

## 2021-12-20 DIAGNOSIS — Z7982 Long term (current) use of aspirin: Secondary | ICD-10-CM | POA: Diagnosis not present

## 2021-12-21 DIAGNOSIS — M8580 Other specified disorders of bone density and structure, unspecified site: Secondary | ICD-10-CM | POA: Diagnosis not present

## 2021-12-21 DIAGNOSIS — Z85528 Personal history of other malignant neoplasm of kidney: Secondary | ICD-10-CM | POA: Diagnosis not present

## 2021-12-21 DIAGNOSIS — J449 Chronic obstructive pulmonary disease, unspecified: Secondary | ICD-10-CM | POA: Diagnosis not present

## 2021-12-21 DIAGNOSIS — K219 Gastro-esophageal reflux disease without esophagitis: Secondary | ICD-10-CM | POA: Diagnosis not present

## 2021-12-21 DIAGNOSIS — Z79899 Other long term (current) drug therapy: Secondary | ICD-10-CM | POA: Diagnosis not present

## 2021-12-21 DIAGNOSIS — F32A Depression, unspecified: Secondary | ICD-10-CM | POA: Diagnosis not present

## 2021-12-21 DIAGNOSIS — K649 Unspecified hemorrhoids: Secondary | ICD-10-CM | POA: Diagnosis not present

## 2021-12-21 DIAGNOSIS — Z9181 History of falling: Secondary | ICD-10-CM | POA: Diagnosis not present

## 2021-12-21 DIAGNOSIS — N189 Chronic kidney disease, unspecified: Secondary | ICD-10-CM | POA: Diagnosis not present

## 2021-12-21 DIAGNOSIS — G250 Essential tremor: Secondary | ICD-10-CM | POA: Diagnosis not present

## 2021-12-21 DIAGNOSIS — G309 Alzheimer's disease, unspecified: Secondary | ICD-10-CM | POA: Diagnosis not present

## 2021-12-21 DIAGNOSIS — E1122 Type 2 diabetes mellitus with diabetic chronic kidney disease: Secondary | ICD-10-CM | POA: Diagnosis not present

## 2021-12-21 DIAGNOSIS — Z7982 Long term (current) use of aspirin: Secondary | ICD-10-CM | POA: Diagnosis not present

## 2021-12-21 DIAGNOSIS — F02C3 Dementia in other diseases classified elsewhere, severe, with mood disturbance: Secondary | ICD-10-CM | POA: Diagnosis not present

## 2021-12-23 DIAGNOSIS — N189 Chronic kidney disease, unspecified: Secondary | ICD-10-CM | POA: Diagnosis not present

## 2021-12-23 DIAGNOSIS — J441 Chronic obstructive pulmonary disease with (acute) exacerbation: Secondary | ICD-10-CM | POA: Diagnosis not present

## 2021-12-23 DIAGNOSIS — E1122 Type 2 diabetes mellitus with diabetic chronic kidney disease: Secondary | ICD-10-CM | POA: Diagnosis not present

## 2021-12-23 DIAGNOSIS — Z7982 Long term (current) use of aspirin: Secondary | ICD-10-CM | POA: Diagnosis not present

## 2021-12-23 DIAGNOSIS — Z79899 Other long term (current) drug therapy: Secondary | ICD-10-CM | POA: Diagnosis not present

## 2021-12-23 DIAGNOSIS — F32A Depression, unspecified: Secondary | ICD-10-CM | POA: Diagnosis not present

## 2021-12-23 DIAGNOSIS — G309 Alzheimer's disease, unspecified: Secondary | ICD-10-CM | POA: Diagnosis not present

## 2021-12-23 DIAGNOSIS — R062 Wheezing: Secondary | ICD-10-CM | POA: Diagnosis not present

## 2021-12-23 DIAGNOSIS — R1310 Dysphagia, unspecified: Secondary | ICD-10-CM | POA: Diagnosis not present

## 2021-12-23 DIAGNOSIS — M8580 Other specified disorders of bone density and structure, unspecified site: Secondary | ICD-10-CM | POA: Diagnosis not present

## 2021-12-23 DIAGNOSIS — F02C3 Dementia in other diseases classified elsewhere, severe, with mood disturbance: Secondary | ICD-10-CM | POA: Diagnosis not present

## 2021-12-23 DIAGNOSIS — J449 Chronic obstructive pulmonary disease, unspecified: Secondary | ICD-10-CM | POA: Diagnosis not present

## 2021-12-23 DIAGNOSIS — Z9181 History of falling: Secondary | ICD-10-CM | POA: Diagnosis not present

## 2021-12-23 DIAGNOSIS — G250 Essential tremor: Secondary | ICD-10-CM | POA: Diagnosis not present

## 2021-12-23 DIAGNOSIS — K649 Unspecified hemorrhoids: Secondary | ICD-10-CM | POA: Diagnosis not present

## 2021-12-23 DIAGNOSIS — K219 Gastro-esophageal reflux disease without esophagitis: Secondary | ICD-10-CM | POA: Diagnosis not present

## 2021-12-23 DIAGNOSIS — Z85528 Personal history of other malignant neoplasm of kidney: Secondary | ICD-10-CM | POA: Diagnosis not present

## 2021-12-23 DIAGNOSIS — R451 Restlessness and agitation: Secondary | ICD-10-CM | POA: Diagnosis not present

## 2021-12-24 ENCOUNTER — Telehealth: Payer: Self-pay

## 2021-12-24 NOTE — Telephone Encounter (Signed)
   Reason for call: ED-Follow up Visit  Patient  visit on 12/19/2021  at [facility]   Have you been able to follow up with your primary care physician? - Yes  The patient was or was not able to obtain any needed medicine or equipment. - Yes  Are there diet recommendations that you are having difficulty following? - No  Patient expresses understanding of discharge instructions and education provided has no other needs at this time.    [place] management  [place], [place]  Main Phone: [phone]  E-mail: [email or web address]  Website: [email or web address]

## 2021-12-28 DIAGNOSIS — Z7982 Long term (current) use of aspirin: Secondary | ICD-10-CM | POA: Diagnosis not present

## 2021-12-28 DIAGNOSIS — N189 Chronic kidney disease, unspecified: Secondary | ICD-10-CM | POA: Diagnosis not present

## 2021-12-28 DIAGNOSIS — G309 Alzheimer's disease, unspecified: Secondary | ICD-10-CM | POA: Diagnosis not present

## 2021-12-28 DIAGNOSIS — Z85528 Personal history of other malignant neoplasm of kidney: Secondary | ICD-10-CM | POA: Diagnosis not present

## 2021-12-28 DIAGNOSIS — G250 Essential tremor: Secondary | ICD-10-CM | POA: Diagnosis not present

## 2021-12-28 DIAGNOSIS — Z9181 History of falling: Secondary | ICD-10-CM | POA: Diagnosis not present

## 2021-12-28 DIAGNOSIS — F32A Depression, unspecified: Secondary | ICD-10-CM | POA: Diagnosis not present

## 2021-12-28 DIAGNOSIS — F02C3 Dementia in other diseases classified elsewhere, severe, with mood disturbance: Secondary | ICD-10-CM | POA: Diagnosis not present

## 2021-12-28 DIAGNOSIS — M8580 Other specified disorders of bone density and structure, unspecified site: Secondary | ICD-10-CM | POA: Diagnosis not present

## 2021-12-28 DIAGNOSIS — E1122 Type 2 diabetes mellitus with diabetic chronic kidney disease: Secondary | ICD-10-CM | POA: Diagnosis not present

## 2021-12-28 DIAGNOSIS — J449 Chronic obstructive pulmonary disease, unspecified: Secondary | ICD-10-CM | POA: Diagnosis not present

## 2021-12-28 DIAGNOSIS — K219 Gastro-esophageal reflux disease without esophagitis: Secondary | ICD-10-CM | POA: Diagnosis not present

## 2021-12-28 DIAGNOSIS — Z79899 Other long term (current) drug therapy: Secondary | ICD-10-CM | POA: Diagnosis not present

## 2021-12-28 DIAGNOSIS — K649 Unspecified hemorrhoids: Secondary | ICD-10-CM | POA: Diagnosis not present

## 2021-12-30 DIAGNOSIS — K649 Unspecified hemorrhoids: Secondary | ICD-10-CM | POA: Diagnosis not present

## 2021-12-30 DIAGNOSIS — J449 Chronic obstructive pulmonary disease, unspecified: Secondary | ICD-10-CM | POA: Diagnosis not present

## 2021-12-30 DIAGNOSIS — G250 Essential tremor: Secondary | ICD-10-CM | POA: Diagnosis not present

## 2021-12-30 DIAGNOSIS — Z85528 Personal history of other malignant neoplasm of kidney: Secondary | ICD-10-CM | POA: Diagnosis not present

## 2021-12-30 DIAGNOSIS — F32A Depression, unspecified: Secondary | ICD-10-CM | POA: Diagnosis not present

## 2021-12-30 DIAGNOSIS — K219 Gastro-esophageal reflux disease without esophagitis: Secondary | ICD-10-CM | POA: Diagnosis not present

## 2021-12-30 DIAGNOSIS — G309 Alzheimer's disease, unspecified: Secondary | ICD-10-CM | POA: Diagnosis not present

## 2021-12-30 DIAGNOSIS — N189 Chronic kidney disease, unspecified: Secondary | ICD-10-CM | POA: Diagnosis not present

## 2021-12-30 DIAGNOSIS — Z79899 Other long term (current) drug therapy: Secondary | ICD-10-CM | POA: Diagnosis not present

## 2021-12-30 DIAGNOSIS — M8580 Other specified disorders of bone density and structure, unspecified site: Secondary | ICD-10-CM | POA: Diagnosis not present

## 2021-12-30 DIAGNOSIS — F02C3 Dementia in other diseases classified elsewhere, severe, with mood disturbance: Secondary | ICD-10-CM | POA: Diagnosis not present

## 2021-12-30 DIAGNOSIS — Z7982 Long term (current) use of aspirin: Secondary | ICD-10-CM | POA: Diagnosis not present

## 2021-12-30 DIAGNOSIS — E1122 Type 2 diabetes mellitus with diabetic chronic kidney disease: Secondary | ICD-10-CM | POA: Diagnosis not present

## 2021-12-30 DIAGNOSIS — Z9181 History of falling: Secondary | ICD-10-CM | POA: Diagnosis not present

## 2022-01-04 DIAGNOSIS — N189 Chronic kidney disease, unspecified: Secondary | ICD-10-CM | POA: Diagnosis not present

## 2022-01-04 DIAGNOSIS — K219 Gastro-esophageal reflux disease without esophagitis: Secondary | ICD-10-CM | POA: Diagnosis not present

## 2022-01-04 DIAGNOSIS — E1122 Type 2 diabetes mellitus with diabetic chronic kidney disease: Secondary | ICD-10-CM | POA: Diagnosis not present

## 2022-01-04 DIAGNOSIS — Z85528 Personal history of other malignant neoplasm of kidney: Secondary | ICD-10-CM | POA: Diagnosis not present

## 2022-01-04 DIAGNOSIS — G250 Essential tremor: Secondary | ICD-10-CM | POA: Diagnosis not present

## 2022-01-04 DIAGNOSIS — Z79899 Other long term (current) drug therapy: Secondary | ICD-10-CM | POA: Diagnosis not present

## 2022-01-04 DIAGNOSIS — G309 Alzheimer's disease, unspecified: Secondary | ICD-10-CM | POA: Diagnosis not present

## 2022-01-04 DIAGNOSIS — Z7982 Long term (current) use of aspirin: Secondary | ICD-10-CM | POA: Diagnosis not present

## 2022-01-04 DIAGNOSIS — K649 Unspecified hemorrhoids: Secondary | ICD-10-CM | POA: Diagnosis not present

## 2022-01-04 DIAGNOSIS — Z9181 History of falling: Secondary | ICD-10-CM | POA: Diagnosis not present

## 2022-01-04 DIAGNOSIS — M8580 Other specified disorders of bone density and structure, unspecified site: Secondary | ICD-10-CM | POA: Diagnosis not present

## 2022-01-04 DIAGNOSIS — J449 Chronic obstructive pulmonary disease, unspecified: Secondary | ICD-10-CM | POA: Diagnosis not present

## 2022-01-04 DIAGNOSIS — F02C3 Dementia in other diseases classified elsewhere, severe, with mood disturbance: Secondary | ICD-10-CM | POA: Diagnosis not present

## 2022-01-04 DIAGNOSIS — F32A Depression, unspecified: Secondary | ICD-10-CM | POA: Diagnosis not present

## 2022-01-06 DIAGNOSIS — Z7982 Long term (current) use of aspirin: Secondary | ICD-10-CM | POA: Diagnosis not present

## 2022-01-06 DIAGNOSIS — G309 Alzheimer's disease, unspecified: Secondary | ICD-10-CM | POA: Diagnosis not present

## 2022-01-06 DIAGNOSIS — Z9181 History of falling: Secondary | ICD-10-CM | POA: Diagnosis not present

## 2022-01-06 DIAGNOSIS — N189 Chronic kidney disease, unspecified: Secondary | ICD-10-CM | POA: Diagnosis not present

## 2022-01-06 DIAGNOSIS — Z85528 Personal history of other malignant neoplasm of kidney: Secondary | ICD-10-CM | POA: Diagnosis not present

## 2022-01-06 DIAGNOSIS — M8580 Other specified disorders of bone density and structure, unspecified site: Secondary | ICD-10-CM | POA: Diagnosis not present

## 2022-01-06 DIAGNOSIS — F32A Depression, unspecified: Secondary | ICD-10-CM | POA: Diagnosis not present

## 2022-01-06 DIAGNOSIS — F02C3 Dementia in other diseases classified elsewhere, severe, with mood disturbance: Secondary | ICD-10-CM | POA: Diagnosis not present

## 2022-01-06 DIAGNOSIS — G250 Essential tremor: Secondary | ICD-10-CM | POA: Diagnosis not present

## 2022-01-06 DIAGNOSIS — K219 Gastro-esophageal reflux disease without esophagitis: Secondary | ICD-10-CM | POA: Diagnosis not present

## 2022-01-06 DIAGNOSIS — Z79899 Other long term (current) drug therapy: Secondary | ICD-10-CM | POA: Diagnosis not present

## 2022-01-06 DIAGNOSIS — E1122 Type 2 diabetes mellitus with diabetic chronic kidney disease: Secondary | ICD-10-CM | POA: Diagnosis not present

## 2022-01-06 DIAGNOSIS — J449 Chronic obstructive pulmonary disease, unspecified: Secondary | ICD-10-CM | POA: Diagnosis not present

## 2022-01-06 DIAGNOSIS — K649 Unspecified hemorrhoids: Secondary | ICD-10-CM | POA: Diagnosis not present

## 2022-01-10 DIAGNOSIS — Z85528 Personal history of other malignant neoplasm of kidney: Secondary | ICD-10-CM | POA: Diagnosis not present

## 2022-01-10 DIAGNOSIS — F02C3 Dementia in other diseases classified elsewhere, severe, with mood disturbance: Secondary | ICD-10-CM | POA: Diagnosis not present

## 2022-01-10 DIAGNOSIS — K649 Unspecified hemorrhoids: Secondary | ICD-10-CM | POA: Diagnosis not present

## 2022-01-10 DIAGNOSIS — N189 Chronic kidney disease, unspecified: Secondary | ICD-10-CM | POA: Diagnosis not present

## 2022-01-10 DIAGNOSIS — G250 Essential tremor: Secondary | ICD-10-CM | POA: Diagnosis not present

## 2022-01-10 DIAGNOSIS — F32A Depression, unspecified: Secondary | ICD-10-CM | POA: Diagnosis not present

## 2022-01-10 DIAGNOSIS — G309 Alzheimer's disease, unspecified: Secondary | ICD-10-CM | POA: Diagnosis not present

## 2022-01-10 DIAGNOSIS — Z9181 History of falling: Secondary | ICD-10-CM | POA: Diagnosis not present

## 2022-01-10 DIAGNOSIS — J449 Chronic obstructive pulmonary disease, unspecified: Secondary | ICD-10-CM | POA: Diagnosis not present

## 2022-01-10 DIAGNOSIS — Z7982 Long term (current) use of aspirin: Secondary | ICD-10-CM | POA: Diagnosis not present

## 2022-01-10 DIAGNOSIS — E1122 Type 2 diabetes mellitus with diabetic chronic kidney disease: Secondary | ICD-10-CM | POA: Diagnosis not present

## 2022-01-10 DIAGNOSIS — K219 Gastro-esophageal reflux disease without esophagitis: Secondary | ICD-10-CM | POA: Diagnosis not present

## 2022-01-10 DIAGNOSIS — M8580 Other specified disorders of bone density and structure, unspecified site: Secondary | ICD-10-CM | POA: Diagnosis not present

## 2022-01-10 DIAGNOSIS — Z79899 Other long term (current) drug therapy: Secondary | ICD-10-CM | POA: Diagnosis not present

## 2022-01-11 DIAGNOSIS — J449 Chronic obstructive pulmonary disease, unspecified: Secondary | ICD-10-CM | POA: Diagnosis not present

## 2022-01-11 DIAGNOSIS — E1122 Type 2 diabetes mellitus with diabetic chronic kidney disease: Secondary | ICD-10-CM | POA: Diagnosis not present

## 2022-01-11 DIAGNOSIS — Z85528 Personal history of other malignant neoplasm of kidney: Secondary | ICD-10-CM | POA: Diagnosis not present

## 2022-01-11 DIAGNOSIS — Z79899 Other long term (current) drug therapy: Secondary | ICD-10-CM | POA: Diagnosis not present

## 2022-01-11 DIAGNOSIS — F32A Depression, unspecified: Secondary | ICD-10-CM | POA: Diagnosis not present

## 2022-01-11 DIAGNOSIS — K219 Gastro-esophageal reflux disease without esophagitis: Secondary | ICD-10-CM | POA: Diagnosis not present

## 2022-01-11 DIAGNOSIS — N189 Chronic kidney disease, unspecified: Secondary | ICD-10-CM | POA: Diagnosis not present

## 2022-01-11 DIAGNOSIS — F02C3 Dementia in other diseases classified elsewhere, severe, with mood disturbance: Secondary | ICD-10-CM | POA: Diagnosis not present

## 2022-01-11 DIAGNOSIS — Z7982 Long term (current) use of aspirin: Secondary | ICD-10-CM | POA: Diagnosis not present

## 2022-01-11 DIAGNOSIS — K649 Unspecified hemorrhoids: Secondary | ICD-10-CM | POA: Diagnosis not present

## 2022-01-11 DIAGNOSIS — G250 Essential tremor: Secondary | ICD-10-CM | POA: Diagnosis not present

## 2022-01-11 DIAGNOSIS — M8580 Other specified disorders of bone density and structure, unspecified site: Secondary | ICD-10-CM | POA: Diagnosis not present

## 2022-01-11 DIAGNOSIS — Z9181 History of falling: Secondary | ICD-10-CM | POA: Diagnosis not present

## 2022-01-11 DIAGNOSIS — G309 Alzheimer's disease, unspecified: Secondary | ICD-10-CM | POA: Diagnosis not present

## 2022-01-13 DIAGNOSIS — R1310 Dysphagia, unspecified: Secondary | ICD-10-CM | POA: Diagnosis not present

## 2022-01-13 DIAGNOSIS — Z681 Body mass index (BMI) 19 or less, adult: Secondary | ICD-10-CM | POA: Diagnosis not present

## 2022-01-13 DIAGNOSIS — J309 Allergic rhinitis, unspecified: Secondary | ICD-10-CM | POA: Diagnosis not present

## 2022-01-13 DIAGNOSIS — R062 Wheezing: Secondary | ICD-10-CM | POA: Diagnosis not present

## 2022-01-13 DIAGNOSIS — K219 Gastro-esophageal reflux disease without esophagitis: Secondary | ICD-10-CM | POA: Diagnosis not present

## 2022-01-13 DIAGNOSIS — R634 Abnormal weight loss: Secondary | ICD-10-CM | POA: Diagnosis not present

## 2022-01-13 DIAGNOSIS — R269 Unspecified abnormalities of gait and mobility: Secondary | ICD-10-CM | POA: Diagnosis not present

## 2022-01-14 DIAGNOSIS — Z79899 Other long term (current) drug therapy: Secondary | ICD-10-CM | POA: Diagnosis not present

## 2022-01-18 DIAGNOSIS — N189 Chronic kidney disease, unspecified: Secondary | ICD-10-CM | POA: Diagnosis not present

## 2022-01-18 DIAGNOSIS — Z85528 Personal history of other malignant neoplasm of kidney: Secondary | ICD-10-CM | POA: Diagnosis not present

## 2022-01-18 DIAGNOSIS — M8580 Other specified disorders of bone density and structure, unspecified site: Secondary | ICD-10-CM | POA: Diagnosis not present

## 2022-01-18 DIAGNOSIS — E1122 Type 2 diabetes mellitus with diabetic chronic kidney disease: Secondary | ICD-10-CM | POA: Diagnosis not present

## 2022-01-18 DIAGNOSIS — K649 Unspecified hemorrhoids: Secondary | ICD-10-CM | POA: Diagnosis not present

## 2022-01-18 DIAGNOSIS — G309 Alzheimer's disease, unspecified: Secondary | ICD-10-CM | POA: Diagnosis not present

## 2022-01-18 DIAGNOSIS — F32A Depression, unspecified: Secondary | ICD-10-CM | POA: Diagnosis not present

## 2022-01-18 DIAGNOSIS — Z7982 Long term (current) use of aspirin: Secondary | ICD-10-CM | POA: Diagnosis not present

## 2022-01-18 DIAGNOSIS — G250 Essential tremor: Secondary | ICD-10-CM | POA: Diagnosis not present

## 2022-01-18 DIAGNOSIS — J449 Chronic obstructive pulmonary disease, unspecified: Secondary | ICD-10-CM | POA: Diagnosis not present

## 2022-01-18 DIAGNOSIS — F02C3 Dementia in other diseases classified elsewhere, severe, with mood disturbance: Secondary | ICD-10-CM | POA: Diagnosis not present

## 2022-01-18 DIAGNOSIS — Z79899 Other long term (current) drug therapy: Secondary | ICD-10-CM | POA: Diagnosis not present

## 2022-01-18 DIAGNOSIS — Z9181 History of falling: Secondary | ICD-10-CM | POA: Diagnosis not present

## 2022-01-18 DIAGNOSIS — K219 Gastro-esophageal reflux disease without esophagitis: Secondary | ICD-10-CM | POA: Diagnosis not present

## 2022-02-09 DIAGNOSIS — R251 Tremor, unspecified: Secondary | ICD-10-CM | POA: Diagnosis not present

## 2022-02-09 DIAGNOSIS — G301 Alzheimer's disease with late onset: Secondary | ICD-10-CM | POA: Diagnosis not present

## 2022-02-09 DIAGNOSIS — E119 Type 2 diabetes mellitus without complications: Secondary | ICD-10-CM | POA: Diagnosis not present

## 2022-02-09 DIAGNOSIS — R0602 Shortness of breath: Secondary | ICD-10-CM | POA: Diagnosis not present

## 2022-02-09 DIAGNOSIS — F02C18 Dementia in other diseases classified elsewhere, severe, with other behavioral disturbance: Secondary | ICD-10-CM | POA: Diagnosis not present

## 2022-02-09 DIAGNOSIS — F02C0 Dementia in other diseases classified elsewhere, severe, without behavioral disturbance, psychotic disturbance, mood disturbance, and anxiety: Secondary | ICD-10-CM | POA: Diagnosis not present

## 2022-02-09 DIAGNOSIS — Z79899 Other long term (current) drug therapy: Secondary | ICD-10-CM | POA: Diagnosis not present

## 2022-02-09 DIAGNOSIS — R062 Wheezing: Secondary | ICD-10-CM | POA: Diagnosis not present

## 2022-02-09 DIAGNOSIS — S80911A Unspecified superficial injury of right knee, initial encounter: Secondary | ICD-10-CM | POA: Diagnosis not present

## 2022-02-09 DIAGNOSIS — L219 Seborrheic dermatitis, unspecified: Secondary | ICD-10-CM | POA: Diagnosis not present

## 2022-02-17 DIAGNOSIS — G301 Alzheimer's disease with late onset: Secondary | ICD-10-CM | POA: Diagnosis not present

## 2022-02-17 DIAGNOSIS — F02C18 Dementia in other diseases classified elsewhere, severe, with other behavioral disturbance: Secondary | ICD-10-CM | POA: Diagnosis not present

## 2022-02-17 DIAGNOSIS — I259 Chronic ischemic heart disease, unspecified: Secondary | ICD-10-CM | POA: Diagnosis not present

## 2022-02-17 DIAGNOSIS — R062 Wheezing: Secondary | ICD-10-CM | POA: Diagnosis not present

## 2022-02-24 DIAGNOSIS — R5381 Other malaise: Secondary | ICD-10-CM | POA: Diagnosis not present

## 2022-02-24 DIAGNOSIS — J069 Acute upper respiratory infection, unspecified: Secondary | ICD-10-CM | POA: Diagnosis not present

## 2022-02-24 DIAGNOSIS — R062 Wheezing: Secondary | ICD-10-CM | POA: Diagnosis not present

## 2022-03-03 DIAGNOSIS — J069 Acute upper respiratory infection, unspecified: Secondary | ICD-10-CM | POA: Diagnosis not present

## 2022-03-03 DIAGNOSIS — R062 Wheezing: Secondary | ICD-10-CM | POA: Diagnosis not present

## 2022-03-03 DIAGNOSIS — S5010XA Contusion of unspecified forearm, initial encounter: Secondary | ICD-10-CM | POA: Diagnosis not present

## 2022-03-04 DIAGNOSIS — I739 Peripheral vascular disease, unspecified: Secondary | ICD-10-CM | POA: Diagnosis not present

## 2022-03-04 DIAGNOSIS — K219 Gastro-esophageal reflux disease without esophagitis: Secondary | ICD-10-CM | POA: Diagnosis not present

## 2022-03-04 DIAGNOSIS — J449 Chronic obstructive pulmonary disease, unspecified: Secondary | ICD-10-CM | POA: Diagnosis not present

## 2022-03-04 DIAGNOSIS — F02C18 Dementia in other diseases classified elsewhere, severe, with other behavioral disturbance: Secondary | ICD-10-CM | POA: Diagnosis not present

## 2022-03-04 DIAGNOSIS — I2510 Atherosclerotic heart disease of native coronary artery without angina pectoris: Secondary | ICD-10-CM | POA: Diagnosis not present

## 2022-03-10 DIAGNOSIS — I259 Chronic ischemic heart disease, unspecified: Secondary | ICD-10-CM | POA: Diagnosis not present

## 2022-03-10 DIAGNOSIS — I739 Peripheral vascular disease, unspecified: Secondary | ICD-10-CM | POA: Diagnosis not present

## 2022-03-10 DIAGNOSIS — G301 Alzheimer's disease with late onset: Secondary | ICD-10-CM | POA: Diagnosis not present

## 2022-03-10 DIAGNOSIS — J449 Chronic obstructive pulmonary disease, unspecified: Secondary | ICD-10-CM | POA: Diagnosis not present

## 2022-03-10 DIAGNOSIS — S5010XD Contusion of unspecified forearm, subsequent encounter: Secondary | ICD-10-CM | POA: Diagnosis not present

## 2022-03-10 DIAGNOSIS — F02C18 Dementia in other diseases classified elsewhere, severe, with other behavioral disturbance: Secondary | ICD-10-CM | POA: Diagnosis not present

## 2022-03-15 DIAGNOSIS — I259 Chronic ischemic heart disease, unspecified: Secondary | ICD-10-CM | POA: Diagnosis not present

## 2022-03-15 DIAGNOSIS — G309 Alzheimer's disease, unspecified: Secondary | ICD-10-CM | POA: Diagnosis not present

## 2022-03-28 ENCOUNTER — Ambulatory Visit: Payer: Medicare Other | Admitting: Urology

## 2022-04-16 DIAGNOSIS — Z79899 Other long term (current) drug therapy: Secondary | ICD-10-CM | POA: Diagnosis not present

## 2022-04-21 DIAGNOSIS — S40021A Contusion of right upper arm, initial encounter: Secondary | ICD-10-CM | POA: Diagnosis not present

## 2022-04-21 DIAGNOSIS — R1311 Dysphagia, oral phase: Secondary | ICD-10-CM | POA: Diagnosis not present

## 2022-04-21 DIAGNOSIS — S5002XA Contusion of left elbow, initial encounter: Secondary | ICD-10-CM | POA: Diagnosis not present

## 2022-07-20 DIAGNOSIS — R051 Acute cough: Secondary | ICD-10-CM | POA: Diagnosis not present

## 2022-07-20 DIAGNOSIS — F01C18 Vascular dementia, severe, with other behavioral disturbance: Secondary | ICD-10-CM | POA: Diagnosis not present

## 2022-07-20 DIAGNOSIS — J449 Chronic obstructive pulmonary disease, unspecified: Secondary | ICD-10-CM | POA: Diagnosis not present

## 2022-07-20 DIAGNOSIS — J309 Allergic rhinitis, unspecified: Secondary | ICD-10-CM | POA: Diagnosis not present

## 2022-07-21 DIAGNOSIS — J069 Acute upper respiratory infection, unspecified: Secondary | ICD-10-CM | POA: Diagnosis not present

## 2022-10-20 ENCOUNTER — Other Ambulatory Visit: Payer: Self-pay | Admitting: Interventional Radiology

## 2022-10-20 DIAGNOSIS — N2889 Other specified disorders of kidney and ureter: Secondary | ICD-10-CM

## 2022-12-01 DIAGNOSIS — N189 Chronic kidney disease, unspecified: Secondary | ICD-10-CM | POA: Diagnosis not present

## 2022-12-01 DIAGNOSIS — I2510 Atherosclerotic heart disease of native coronary artery without angina pectoris: Secondary | ICD-10-CM | POA: Diagnosis not present

## 2022-12-01 DIAGNOSIS — J449 Chronic obstructive pulmonary disease, unspecified: Secondary | ICD-10-CM | POA: Diagnosis not present

## 2022-12-01 DIAGNOSIS — I1310 Hypertensive heart and chronic kidney disease without heart failure, with stage 1 through stage 4 chronic kidney disease, or unspecified chronic kidney disease: Secondary | ICD-10-CM | POA: Diagnosis not present

## 2022-12-01 DIAGNOSIS — E1122 Type 2 diabetes mellitus with diabetic chronic kidney disease: Secondary | ICD-10-CM | POA: Diagnosis not present

## 2022-12-21 DIAGNOSIS — M25551 Pain in right hip: Secondary | ICD-10-CM | POA: Diagnosis not present

## 2022-12-21 DIAGNOSIS — M79661 Pain in right lower leg: Secondary | ICD-10-CM | POA: Diagnosis not present

## 2022-12-21 DIAGNOSIS — M25571 Pain in right ankle and joints of right foot: Secondary | ICD-10-CM | POA: Diagnosis not present

## 2022-12-21 DIAGNOSIS — M79651 Pain in right thigh: Secondary | ICD-10-CM | POA: Diagnosis not present

## 2022-12-21 DIAGNOSIS — M25561 Pain in right knee: Secondary | ICD-10-CM | POA: Diagnosis not present

## 2023-01-20 DEATH — deceased

## 2023-02-26 IMAGING — CT CT ABDOMEN WO/W CM
3 of 13 series · 10 of 46 positions shown, 16 images · IV contrast (omnipaque)
Comparison: October 08, 2019

CLINICAL DATA: Follow-up LEFT renal mass post cryoablation.

EXAM:
CT ABDOMEN WITHOUT AND WITH CONTRAST
TECHNIQUE: Multidetector CT imaging of the abdomen was performed following the
standard protocol before and following the bolus administration of
intravenous contrast.
CONTRAST:  100mL OMNIPAQUE IOHEXOL 300 MG/ML  SOLN

[Series 3: coronal pre · coronal · non-contrast · 0.47mm/px · 2 of 93 slices shown, 3 images]
[im 31/93  soft-tissue]
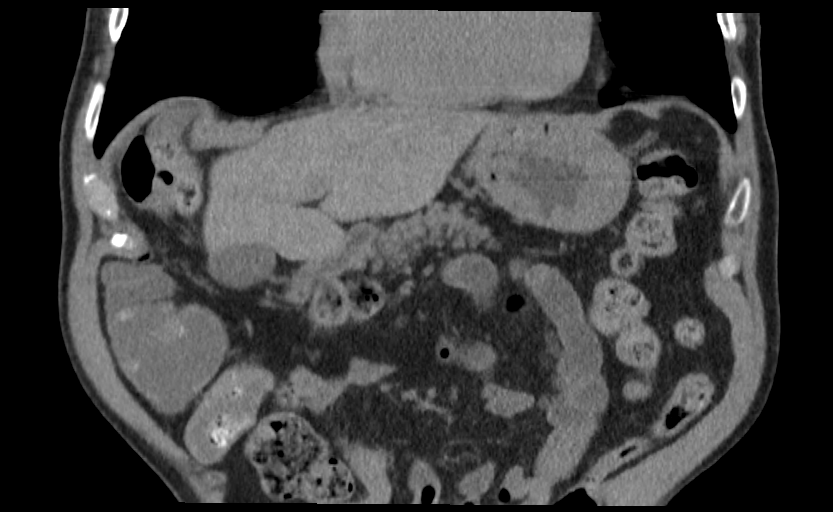
[im 31/93  bone]
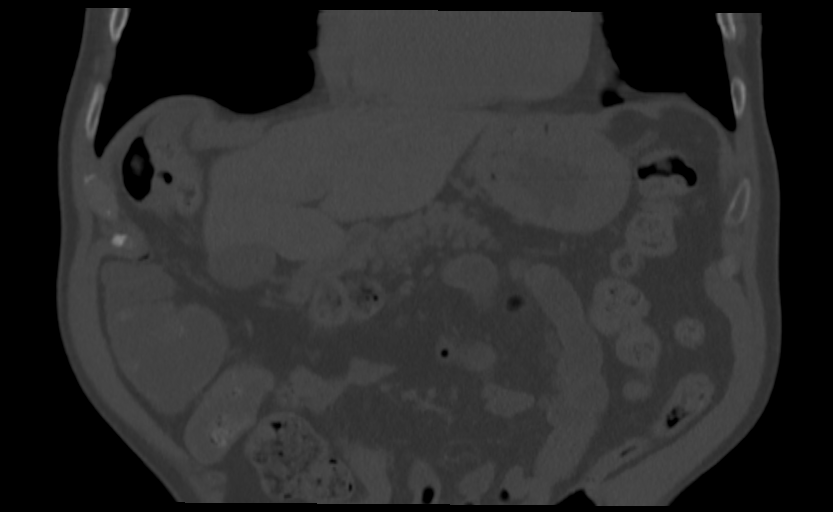
[im 62/93  soft-tissue]
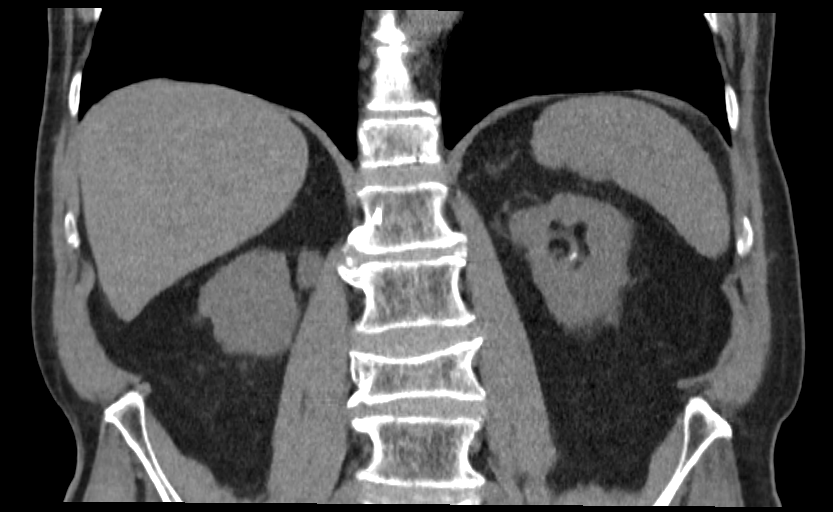

[Series 6: axial arterial · axial · arterial · 0.80mm/px · z∈[-662,-510]mm · 5 of 77 slices shown, 10 images]
[im 13/77  soft-tissue]
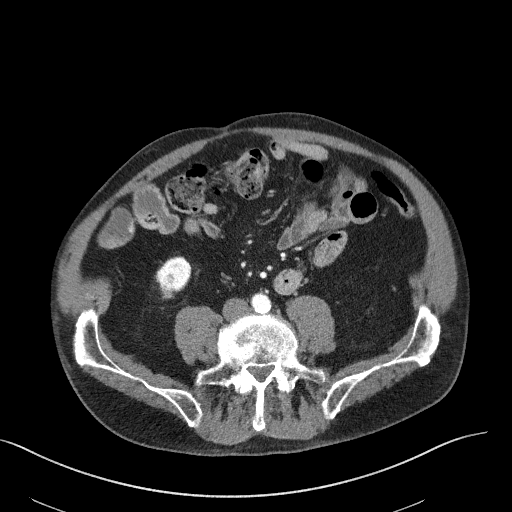
[im 13/77  bone]
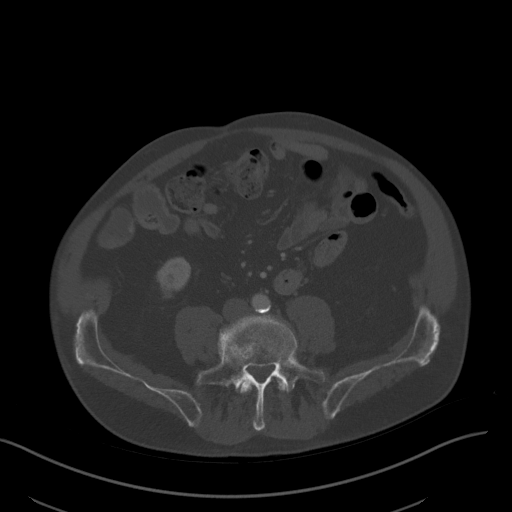
[im 26/77  soft-tissue]
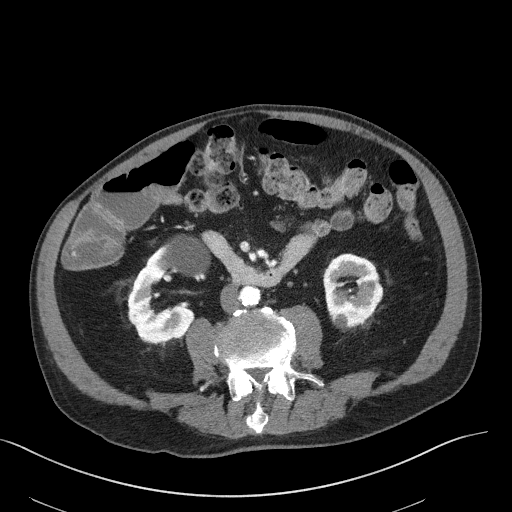
[im 26/77  lung]
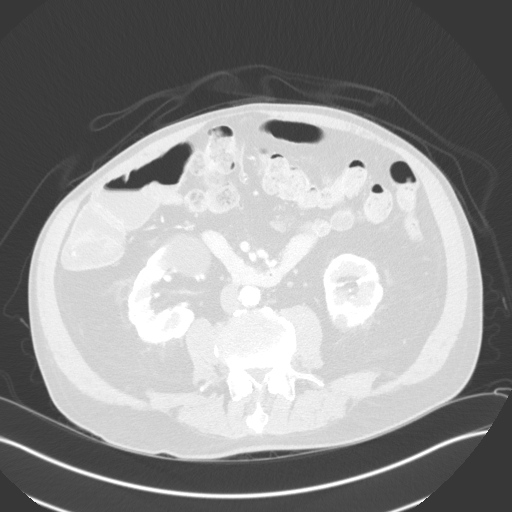
[im 39/77  soft-tissue]
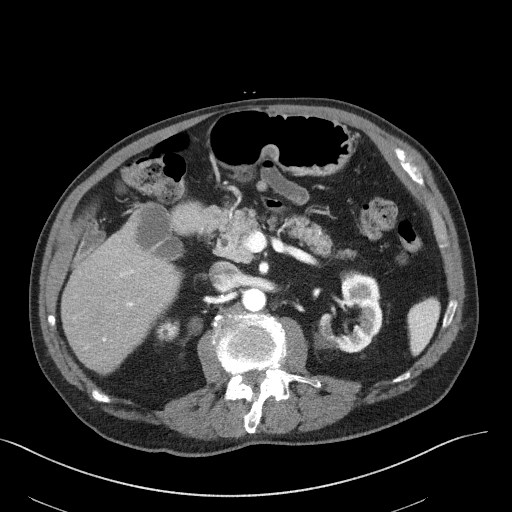
[im 39/77  lung]
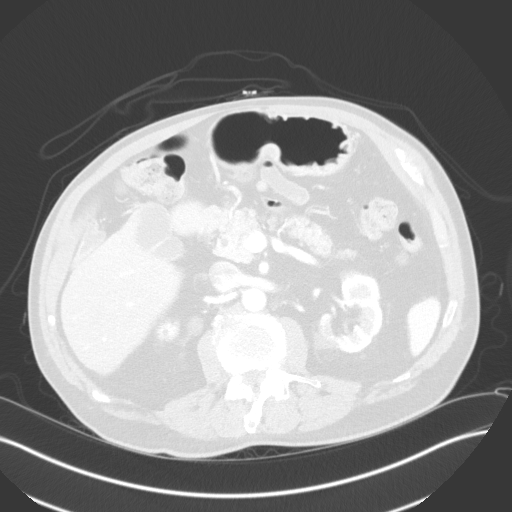
[im 51/77  soft-tissue]
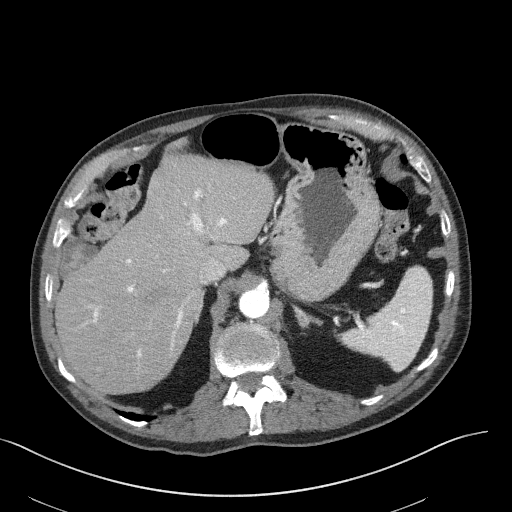
[im 51/77  lung]
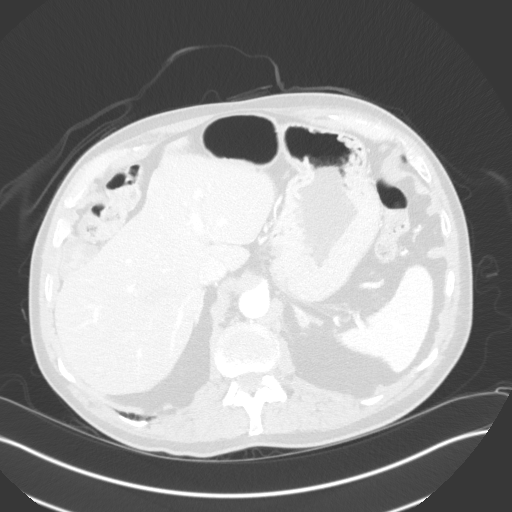
[im 64/77  soft-tissue]
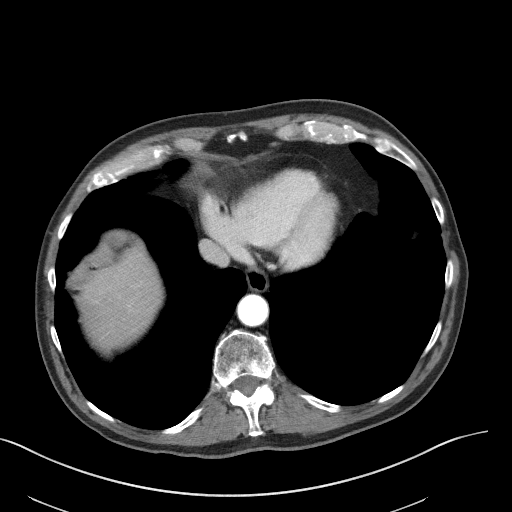
[im 64/77  lung]
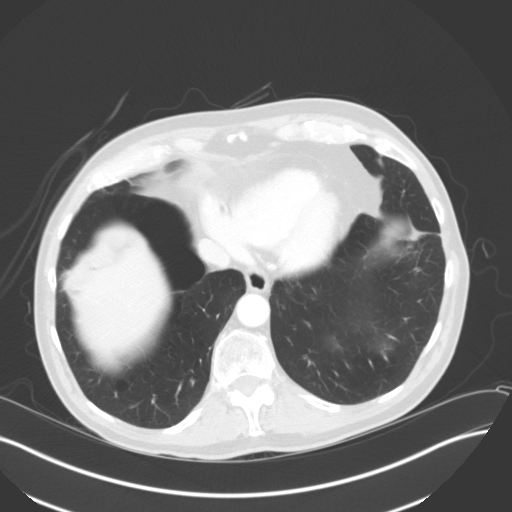

[Series 11: axial nephrographic · axial · 0.80mm/px · z∈[-662,-584]mm · 3 of 77 slices shown]
[im 13/77  soft-tissue]
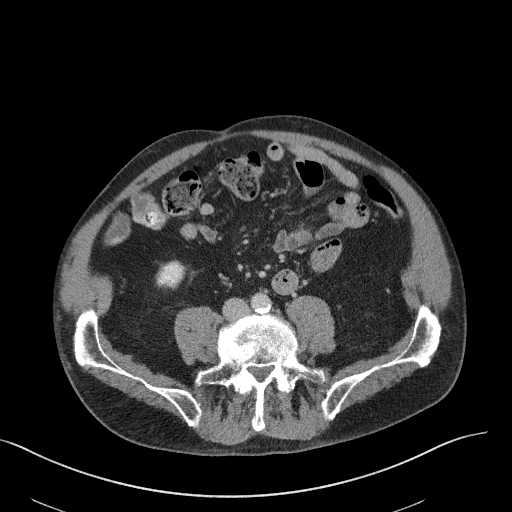
[im 26/77  soft-tissue]
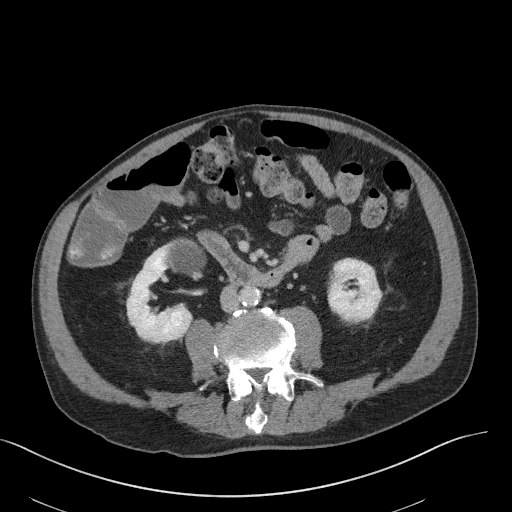
[im 39/77  soft-tissue]
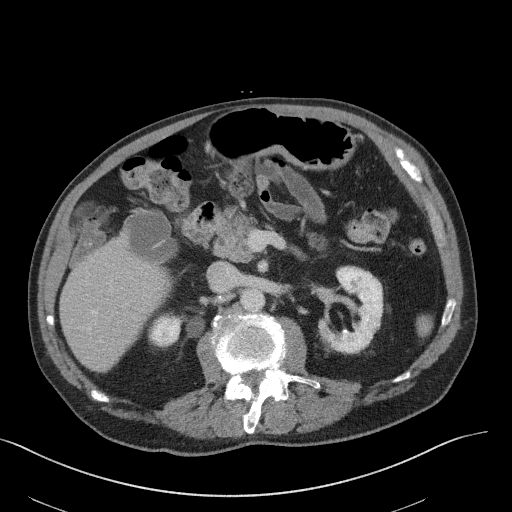

[10 of 46 positions shown; findings below may reference images not displayed]

FINDINGS: Lower chest: Lung bases are clear. Signs of pulmonary emphysema as
before.

Hepatobiliary: Liver without focal, suspicious hepatic lesion. No
biliary duct dilation. No pericholecystic stranding. Portal vein is
patent.

Pancreas: Normal, without mass, inflammation or ductal dilatation.

Spleen: Normal

Adrenals/Urinary Tract: Adrenal glands are normal.

LEFT upper pole renal cryoablation changes. Nodular enhancing area
removed from renal parenchyma along the deep margin. This was not
present on previous imaging though it does follow the adjacent but
not contiguous renal cortex on arterial phase and to a lesser extent
on the venous phase. Ablated area at in total measuring 2.2 by
approximately 1.8 cm axial dimension as compared to 3.0 x 2.5 cm on
the prior study.

Small hemorrhagic cyst arises from the lower pole of the LEFT
kidney. Renal cortical scarring. Small cysts seen throughout the
RIGHT and LEFT kidney elsewhere with a larger cyst arising from the
anterior hilar lip of the RIGHT kidney that measures approximately
4.7 x 3.7 cm.

Nephrolithiasis without hydronephrosis or significant perinephric
stranding. Findings are similar to the prior exam

Stomach/Bowel: Small hiatal hernia. Stomach under distended. No
acute gastrointestinal process to the extent evaluated on abdominal
CT.

Vascular/Lymphatic: Vascular structures in the abdomen are patent
with signs of calcified and noncalcified atheromatous plaque
throughout the abdominal aorta. No aneurysmal dilation. There is no
gastrohepatic or hepatoduodenal ligament lymphadenopathy. No
retroperitoneal or mesenteric lymphadenopathy.

Other: No ascites

Musculoskeletal: No acute musculoskeletal process. Spinal
degenerative changes. Compression fractures at T12 and L3 with
similar appearance.
IMPRESSION: 1. LEFT upper pole renal cryoablation changes. Nodular enhancing
area removed from renal parenchyma along the deep margin. This was
not present on previous imaging though it does follow the adjacent
but not contiguous renal cortex on arterial phase and to a lesser
extent on the venous phase. Findings given nodular appearance are
concerning for recurrence at the margin of the ablation site.
Recovery of some perfusion of renal cortex in this area is also
considered. Suggest short interval follow-up, 3 month follow-up to
may help to differentiate.
2. Bilateral renal cysts with a larger cyst arising from the
anterior hilar lip of the RIGHT kidney that measures approximately
4.7 x 3.7 cm. Some of these show hemorrhagic or proteinaceous
features.
3. Nephrolithiasis without hydronephrosis or significant perinephric
stranding.
4. Signs of pulmonary emphysema as before.
5. Small hiatal hernia.
6. Compression fractures at T12 and L3 with similar appearance.
7. Emphysema and aortic atherosclerosis.

Aortic Atherosclerosis (WRUOU-A47.7) and Emphysema ([id number].B).

## 2023-09-21 IMAGING — DX DG WRIST 2V*R*
2 series · 2 of 2 positions shown · non-contrast
Comparison: None.

CLINICAL DATA: Wrist pain

EXAM:
RIGHT WRIST - 2 VIEW

[wrist ap]
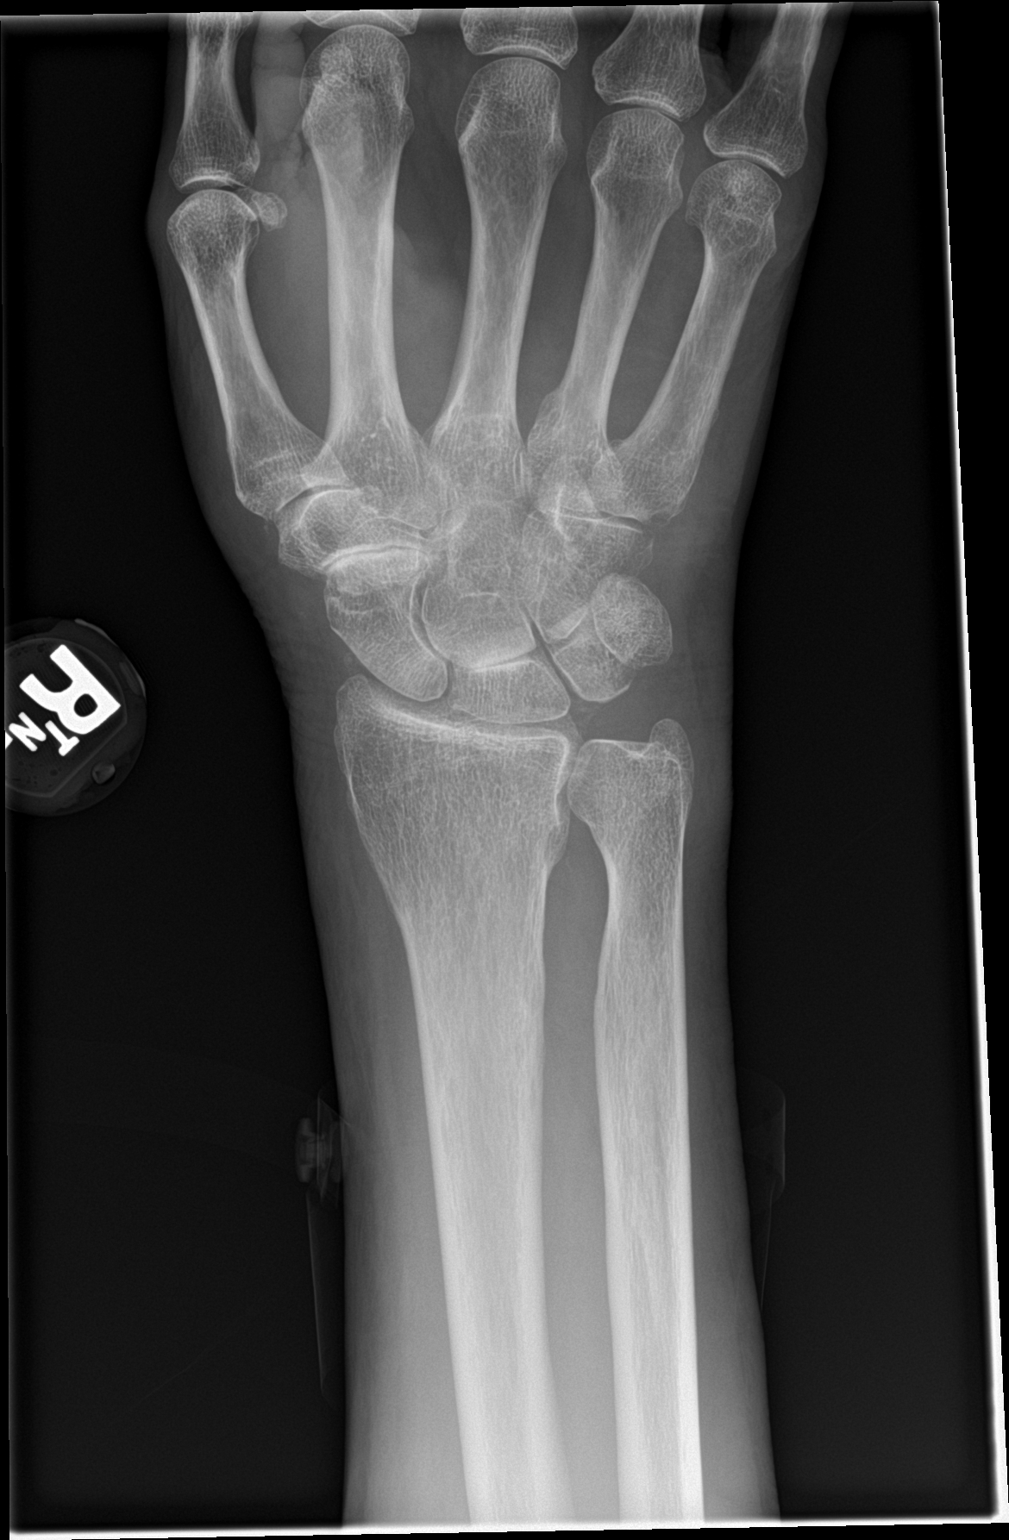

[wrist lat]
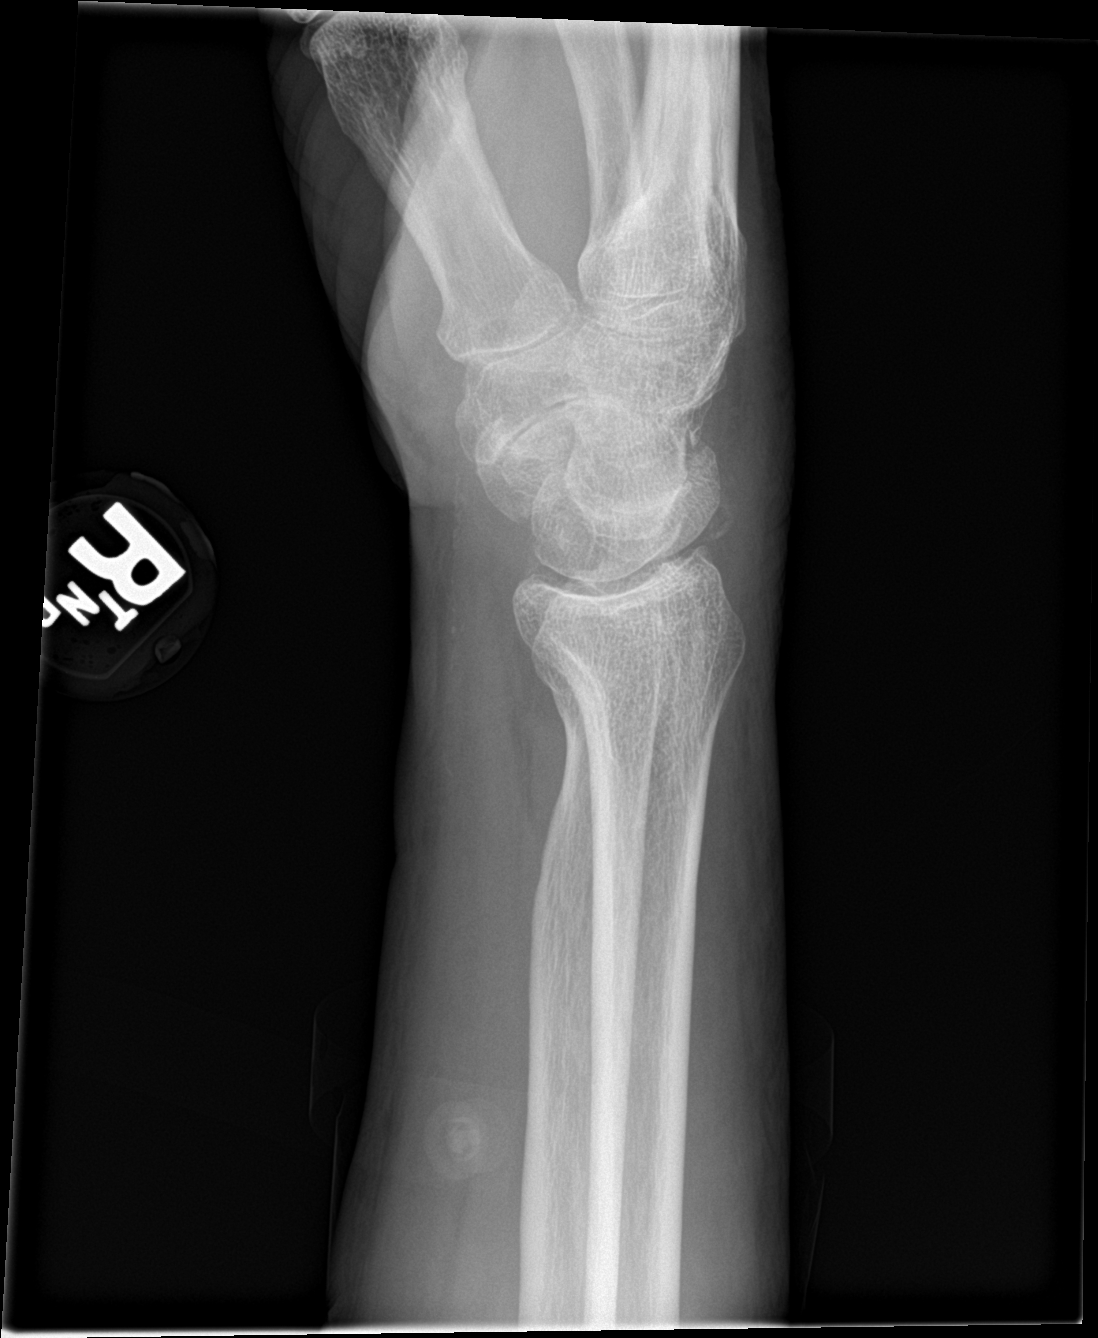

[2 of 2 positions shown; findings below may reference images not displayed]

FINDINGS: Loss of joint space in the radial carpus is consistent with
degenerative disease. No evidence for an acute fracture. No
subluxation or dislocation. No worrisome lytic or sclerotic osseous
abnormality.
IMPRESSION: Degenerative changes in the radial carpus. No acute bony
abnormality.
